# Patient Record
Sex: Female | Born: 1965
Health system: Southern US, Community
[De-identification: ages and names within clinical notes are randomized; demographics above are authoritative.]

## PROBLEM LIST (undated history)

## (undated) DIAGNOSIS — F319 Bipolar disorder, unspecified: Secondary | ICD-10-CM

## (undated) DIAGNOSIS — F329 Major depressive disorder, single episode, unspecified: Secondary | ICD-10-CM

## (undated) DIAGNOSIS — F32A Depression, unspecified: Secondary | ICD-10-CM

## (undated) DIAGNOSIS — R569 Unspecified convulsions: Secondary | ICD-10-CM

## (undated) DIAGNOSIS — F419 Anxiety disorder, unspecified: Secondary | ICD-10-CM

## (undated) DIAGNOSIS — D649 Anemia, unspecified: Secondary | ICD-10-CM

## (undated) DIAGNOSIS — I10 Essential (primary) hypertension: Secondary | ICD-10-CM

## (undated) DIAGNOSIS — Z9289 Personal history of other medical treatment: Secondary | ICD-10-CM

## (undated) DIAGNOSIS — IMO0001 Reserved for inherently not codable concepts without codable children: Secondary | ICD-10-CM

## (undated) DIAGNOSIS — K219 Gastro-esophageal reflux disease without esophagitis: Secondary | ICD-10-CM

## (undated) HISTORY — DX: Essential (primary) hypertension: I10

## (undated) HISTORY — PX: PANNICULECTOMY: SUR1001

## (undated) HISTORY — DX: Major depressive disorder, single episode, unspecified: F32.9

## (undated) HISTORY — DX: Anxiety disorder, unspecified: F41.9

## (undated) HISTORY — DX: Depression, unspecified: F32.A

## (undated) HISTORY — DX: Bipolar disorder, unspecified: F31.9

## (undated) HISTORY — PX: CHOLECYSTECTOMY: SHX55

## (undated) HISTORY — PX: LAPAROSCOPIC GASTROTOMY W/ REPAIR OF ULCER: SUR772

## (undated) HISTORY — PX: ROTATOR CUFF REPAIR: SHX139

## (undated) HISTORY — DX: Reserved for inherently not codable concepts without codable children: IMO0001

---

## 1998-03-21 HISTORY — PX: GASTRIC BYPASS: SHX52

## 1998-10-29 ENCOUNTER — Ambulatory Visit: Admission: RE | Admit: 1998-10-29 | Discharge: 1998-10-29 | Payer: Self-pay | Admitting: Internal Medicine

## 1998-12-28 ENCOUNTER — Other Ambulatory Visit: Admission: RE | Admit: 1998-12-28 | Discharge: 1998-12-28 | Payer: Self-pay | Admitting: Obstetrics and Gynecology

## 2000-01-13 ENCOUNTER — Other Ambulatory Visit: Admission: RE | Admit: 2000-01-13 | Discharge: 2000-01-13 | Payer: Self-pay | Admitting: Gynecology

## 2000-01-19 ENCOUNTER — Encounter (INDEPENDENT_AMBULATORY_CARE_PROVIDER_SITE_OTHER): Payer: Self-pay

## 2000-01-19 ENCOUNTER — Other Ambulatory Visit: Admission: RE | Admit: 2000-01-19 | Discharge: 2000-01-19 | Payer: Self-pay | Admitting: Gynecology

## 2000-03-25 ENCOUNTER — Encounter: Payer: Self-pay | Admitting: Emergency Medicine

## 2000-03-25 ENCOUNTER — Emergency Department (HOSPITAL_COMMUNITY): Admission: EM | Admit: 2000-03-25 | Discharge: 2000-03-25 | Payer: Self-pay | Admitting: Emergency Medicine

## 2000-11-24 ENCOUNTER — Ambulatory Visit (HOSPITAL_COMMUNITY): Admission: RE | Admit: 2000-11-24 | Discharge: 2000-11-24 | Payer: Self-pay | Admitting: Internal Medicine

## 2001-02-20 ENCOUNTER — Other Ambulatory Visit: Admission: RE | Admit: 2001-02-20 | Discharge: 2001-02-20 | Payer: Self-pay | Admitting: Gynecology

## 2001-06-14 ENCOUNTER — Encounter: Payer: Self-pay | Admitting: Internal Medicine

## 2001-06-14 ENCOUNTER — Ambulatory Visit (HOSPITAL_COMMUNITY): Admission: RE | Admit: 2001-06-14 | Discharge: 2001-06-14 | Payer: Self-pay | Admitting: Internal Medicine

## 2001-11-06 ENCOUNTER — Inpatient Hospital Stay (HOSPITAL_COMMUNITY): Admission: RE | Admit: 2001-11-06 | Discharge: 2001-11-08 | Payer: Self-pay | Admitting: Orthopaedic Surgery

## 2002-07-02 ENCOUNTER — Other Ambulatory Visit: Admission: RE | Admit: 2002-07-02 | Discharge: 2002-07-02 | Payer: Self-pay | Admitting: Gynecology

## 2002-09-13 ENCOUNTER — Emergency Department (HOSPITAL_COMMUNITY): Admission: EM | Admit: 2002-09-13 | Discharge: 2002-09-13 | Payer: Self-pay | Admitting: Emergency Medicine

## 2002-09-13 ENCOUNTER — Encounter: Payer: Self-pay | Admitting: Emergency Medicine

## 2003-04-23 ENCOUNTER — Emergency Department (HOSPITAL_COMMUNITY): Admission: EM | Admit: 2003-04-23 | Discharge: 2003-04-23 | Payer: Self-pay | Admitting: Emergency Medicine

## 2003-05-30 ENCOUNTER — Ambulatory Visit (HOSPITAL_COMMUNITY): Admission: RE | Admit: 2003-05-30 | Discharge: 2003-05-30 | Payer: Self-pay | Admitting: Orthopaedic Surgery

## 2003-06-24 ENCOUNTER — Inpatient Hospital Stay (HOSPITAL_COMMUNITY): Admission: RE | Admit: 2003-06-24 | Discharge: 2003-06-26 | Payer: Self-pay | Admitting: Orthopaedic Surgery

## 2003-11-28 ENCOUNTER — Ambulatory Visit: Payer: Self-pay | Admitting: Psychiatry

## 2004-03-01 ENCOUNTER — Other Ambulatory Visit: Admission: RE | Admit: 2004-03-01 | Discharge: 2004-03-01 | Payer: Self-pay | Admitting: Gynecology

## 2004-03-20 ENCOUNTER — Emergency Department (HOSPITAL_COMMUNITY): Admission: EM | Admit: 2004-03-20 | Discharge: 2004-03-20 | Payer: Self-pay | Admitting: Emergency Medicine

## 2004-08-20 ENCOUNTER — Ambulatory Visit: Payer: Self-pay | Admitting: Psychiatry

## 2004-09-25 ENCOUNTER — Emergency Department (HOSPITAL_COMMUNITY): Admission: EM | Admit: 2004-09-25 | Discharge: 2004-09-25 | Payer: Self-pay | Admitting: Emergency Medicine

## 2004-10-14 ENCOUNTER — Inpatient Hospital Stay (HOSPITAL_COMMUNITY): Admission: EM | Admit: 2004-10-14 | Discharge: 2004-10-20 | Payer: Self-pay | Admitting: *Deleted

## 2004-10-14 ENCOUNTER — Ambulatory Visit: Payer: Self-pay | Admitting: Gastroenterology

## 2004-10-29 ENCOUNTER — Ambulatory Visit: Payer: Self-pay | Admitting: Psychiatry

## 2004-12-17 ENCOUNTER — Ambulatory Visit: Payer: Self-pay | Admitting: Psychiatry

## 2005-01-12 ENCOUNTER — Emergency Department (HOSPITAL_COMMUNITY): Admission: EM | Admit: 2005-01-12 | Discharge: 2005-01-12 | Payer: Self-pay | Admitting: Emergency Medicine

## 2005-01-18 ENCOUNTER — Ambulatory Visit (HOSPITAL_COMMUNITY): Admission: RE | Admit: 2005-01-18 | Discharge: 2005-01-18 | Payer: Self-pay | Admitting: Family Medicine

## 2005-02-04 ENCOUNTER — Ambulatory Visit: Payer: Self-pay | Admitting: Psychiatry

## 2005-03-12 ENCOUNTER — Inpatient Hospital Stay (HOSPITAL_COMMUNITY): Admission: EM | Admit: 2005-03-12 | Discharge: 2005-03-16 | Payer: Self-pay | Admitting: Emergency Medicine

## 2005-04-08 ENCOUNTER — Other Ambulatory Visit: Admission: RE | Admit: 2005-04-08 | Discharge: 2005-04-08 | Payer: Self-pay | Admitting: Gynecology

## 2005-04-15 ENCOUNTER — Ambulatory Visit: Payer: Self-pay | Admitting: Psychiatry

## 2005-05-16 ENCOUNTER — Ambulatory Visit (HOSPITAL_COMMUNITY): Admission: RE | Admit: 2005-05-16 | Discharge: 2005-05-16 | Payer: Self-pay | Admitting: Orthopaedic Surgery

## 2005-06-10 ENCOUNTER — Ambulatory Visit (HOSPITAL_COMMUNITY): Payer: Self-pay | Admitting: Psychiatry

## 2005-08-02 ENCOUNTER — Ambulatory Visit (HOSPITAL_COMMUNITY): Admission: RE | Admit: 2005-08-02 | Discharge: 2005-08-02 | Payer: Self-pay | Admitting: Family Medicine

## 2005-08-05 ENCOUNTER — Ambulatory Visit (HOSPITAL_COMMUNITY): Payer: Self-pay | Admitting: Psychiatry

## 2005-12-23 ENCOUNTER — Ambulatory Visit (HOSPITAL_COMMUNITY): Payer: Self-pay | Admitting: Psychiatry

## 2006-05-20 ENCOUNTER — Emergency Department (HOSPITAL_COMMUNITY): Admission: EM | Admit: 2006-05-20 | Discharge: 2006-05-20 | Payer: Self-pay | Admitting: Emergency Medicine

## 2006-05-28 ENCOUNTER — Emergency Department (HOSPITAL_COMMUNITY): Admission: EM | Admit: 2006-05-28 | Discharge: 2006-05-28 | Payer: Self-pay | Admitting: Emergency Medicine

## 2006-06-23 ENCOUNTER — Ambulatory Visit (HOSPITAL_COMMUNITY): Payer: Self-pay | Admitting: Psychiatry

## 2006-09-01 ENCOUNTER — Ambulatory Visit (HOSPITAL_COMMUNITY): Payer: Self-pay | Admitting: Psychiatry

## 2006-09-25 ENCOUNTER — Ambulatory Visit: Payer: Self-pay | Admitting: Psychiatry

## 2006-09-25 ENCOUNTER — Inpatient Hospital Stay (HOSPITAL_COMMUNITY): Admission: AD | Admit: 2006-09-25 | Discharge: 2006-09-29 | Payer: Self-pay | Admitting: Psychiatry

## 2006-10-13 ENCOUNTER — Ambulatory Visit (HOSPITAL_COMMUNITY): Payer: Self-pay | Admitting: Psychiatry

## 2006-12-07 ENCOUNTER — Ambulatory Visit: Payer: Self-pay | Admitting: *Deleted

## 2006-12-07 ENCOUNTER — Inpatient Hospital Stay (HOSPITAL_COMMUNITY): Admission: EM | Admit: 2006-12-07 | Discharge: 2006-12-12 | Payer: Self-pay | Admitting: Emergency Medicine

## 2006-12-08 ENCOUNTER — Other Ambulatory Visit: Payer: Self-pay | Admitting: *Deleted

## 2006-12-22 ENCOUNTER — Ambulatory Visit (HOSPITAL_COMMUNITY): Payer: Self-pay | Admitting: Psychiatry

## 2007-01-19 ENCOUNTER — Ambulatory Visit (HOSPITAL_COMMUNITY): Payer: Self-pay | Admitting: Psychiatry

## 2007-06-22 ENCOUNTER — Ambulatory Visit (HOSPITAL_COMMUNITY): Payer: Self-pay | Admitting: Psychiatry

## 2007-07-20 ENCOUNTER — Ambulatory Visit (HOSPITAL_COMMUNITY): Payer: Self-pay | Admitting: Psychiatry

## 2007-09-14 ENCOUNTER — Ambulatory Visit (HOSPITAL_COMMUNITY): Payer: Self-pay | Admitting: Psychiatry

## 2007-10-12 ENCOUNTER — Ambulatory Visit (HOSPITAL_COMMUNITY): Payer: Self-pay | Admitting: Psychiatry

## 2007-10-13 ENCOUNTER — Inpatient Hospital Stay (HOSPITAL_COMMUNITY): Admission: EM | Admit: 2007-10-13 | Discharge: 2007-10-16 | Payer: Self-pay | Admitting: Emergency Medicine

## 2008-01-04 ENCOUNTER — Ambulatory Visit (HOSPITAL_COMMUNITY): Payer: Self-pay | Admitting: Psychiatry

## 2008-05-09 ENCOUNTER — Ambulatory Visit (HOSPITAL_COMMUNITY): Payer: Self-pay | Admitting: Psychiatry

## 2008-06-27 ENCOUNTER — Ambulatory Visit (HOSPITAL_COMMUNITY): Payer: Self-pay | Admitting: Psychiatry

## 2008-08-01 ENCOUNTER — Ambulatory Visit (HOSPITAL_COMMUNITY): Payer: Self-pay | Admitting: Psychiatry

## 2008-08-26 ENCOUNTER — Encounter: Admission: RE | Admit: 2008-08-26 | Discharge: 2008-08-26 | Payer: Self-pay | Admitting: Internal Medicine

## 2008-10-30 ENCOUNTER — Ambulatory Visit (HOSPITAL_COMMUNITY): Payer: Self-pay | Admitting: Psychiatry

## 2009-01-16 ENCOUNTER — Ambulatory Visit (HOSPITAL_COMMUNITY): Payer: Self-pay | Admitting: Psychiatry

## 2009-02-27 ENCOUNTER — Ambulatory Visit (HOSPITAL_COMMUNITY): Payer: Self-pay | Admitting: Psychiatry

## 2009-03-11 ENCOUNTER — Observation Stay (HOSPITAL_COMMUNITY): Admission: EM | Admit: 2009-03-11 | Discharge: 2009-03-11 | Payer: Self-pay | Admitting: Emergency Medicine

## 2009-04-24 ENCOUNTER — Ambulatory Visit (HOSPITAL_COMMUNITY): Payer: Self-pay | Admitting: Psychiatry

## 2009-05-22 ENCOUNTER — Ambulatory Visit (HOSPITAL_COMMUNITY): Payer: Self-pay | Admitting: Psychiatry

## 2009-07-31 ENCOUNTER — Ambulatory Visit (HOSPITAL_COMMUNITY): Payer: Self-pay | Admitting: Psychiatry

## 2009-10-18 ENCOUNTER — Emergency Department (HOSPITAL_COMMUNITY): Admission: EM | Admit: 2009-10-18 | Discharge: 2009-10-18 | Payer: Self-pay | Admitting: Emergency Medicine

## 2009-11-25 ENCOUNTER — Ambulatory Visit (HOSPITAL_COMMUNITY): Admission: RE | Admit: 2009-11-25 | Discharge: 2009-11-25 | Payer: Self-pay | Admitting: Psychiatry

## 2010-03-21 HISTORY — PX: INTRAUTERINE DEVICE (IUD) INSERTION: SHX5877

## 2010-04-11 ENCOUNTER — Encounter: Payer: Self-pay | Admitting: Orthopaedic Surgery

## 2010-04-11 ENCOUNTER — Encounter: Payer: Self-pay | Admitting: Internal Medicine

## 2010-06-05 LAB — POCT I-STAT, CHEM 8
BUN: 21 mg/dL (ref 6–23)
Calcium, Ion: 1.06 mmol/L — ABNORMAL LOW (ref 1.12–1.32)
Chloride: 110 mEq/L (ref 96–112)
Creatinine, Ser: 1.5 mg/dL — ABNORMAL HIGH (ref 0.4–1.2)
Glucose, Bld: 90 mg/dL (ref 70–99)
HCT: 42 % (ref 36.0–46.0)
Hemoglobin: 14.3 g/dL (ref 12.0–15.0)
Potassium: 3.7 mEq/L (ref 3.5–5.1)
Sodium: 142 mEq/L (ref 135–145)
TCO2: 23 mmol/L (ref 0–100)

## 2010-06-05 LAB — POCT PREGNANCY, URINE: Preg Test, Ur: NEGATIVE

## 2010-06-05 LAB — RAPID URINE DRUG SCREEN, HOSP PERFORMED
Amphetamines: NOT DETECTED
Barbiturates: NOT DETECTED
Benzodiazepines: POSITIVE — AB
Cocaine: NOT DETECTED
Opiates: NOT DETECTED
Tetrahydrocannabinol: NOT DETECTED

## 2010-06-05 LAB — URINALYSIS, ROUTINE W REFLEX MICROSCOPIC
Bilirubin Urine: NEGATIVE
Glucose, UA: NEGATIVE mg/dL
Hgb urine dipstick: NEGATIVE
Ketones, ur: NEGATIVE mg/dL
Leukocytes, UA: NEGATIVE
Nitrite: NEGATIVE
Protein, ur: 30 mg/dL — AB
Specific Gravity, Urine: 1.041 — ABNORMAL HIGH (ref 1.005–1.030)
Urobilinogen, UA: 0.2 mg/dL (ref 0.0–1.0)
pH: 6 (ref 5.0–8.0)

## 2010-06-05 LAB — URINE MICROSCOPIC-ADD ON: Urine-Other: NONE SEEN

## 2010-06-05 LAB — ETHANOL: Alcohol, Ethyl (B): <5 mg/dL (ref 0–10)

## 2010-06-21 LAB — COMPREHENSIVE METABOLIC PANEL
ALT: 107 U/L — ABNORMAL HIGH (ref 0–35)
AST: 53 U/L — ABNORMAL HIGH (ref 0–37)
Albumin: 3.9 g/dL (ref 3.5–5.2)
Alkaline Phosphatase: 180 U/L — ABNORMAL HIGH (ref 39–117)
BUN: 11 mg/dL (ref 6–23)
CO2: 25 mEq/L (ref 19–32)
Calcium: 9.4 mg/dL (ref 8.4–10.5)
Chloride: 105 mEq/L (ref 96–112)
Creatinine, Ser: 1.31 mg/dL — ABNORMAL HIGH (ref 0.4–1.2)
GFR calc Af Amer: 54 mL/min — ABNORMAL LOW (ref >60)
GFR calc non Af Amer: 45 mL/min — ABNORMAL LOW (ref >60)
Glucose, Bld: 110 mg/dL — ABNORMAL HIGH (ref 70–99)
Potassium: 3.8 mEq/L (ref 3.5–5.1)
Sodium: 142 mEq/L (ref 135–145)
Total Bilirubin: 1 mg/dL (ref 0.3–1.2)
Total Protein: 7.4 g/dL (ref 6.0–8.3)

## 2010-06-21 LAB — CBC
HCT: 44.3 % (ref 36.0–46.0)
Hemoglobin: 14.6 g/dL (ref 12.0–15.0)
MCHC: 32.9 g/dL (ref 30.0–36.0)
MCV: 88.1 fL (ref 78.0–100.0)
Platelets: 236 10*3/uL (ref 150–400)
RBC: 5.03 MIL/uL (ref 3.87–5.11)
RDW: 15.9 % — ABNORMAL HIGH (ref 11.5–15.5)
WBC: 7.3 10*3/uL (ref 4.0–10.5)

## 2010-06-21 LAB — URINALYSIS, ROUTINE W REFLEX MICROSCOPIC
Bilirubin Urine: NEGATIVE
Glucose, UA: NEGATIVE mg/dL
Ketones, ur: NEGATIVE mg/dL
Leukocytes, UA: NEGATIVE
Nitrite: NEGATIVE
Protein, ur: 100 mg/dL — AB
Specific Gravity, Urine: 1.013 (ref 1.005–1.030)
Urobilinogen, UA: 0.2 mg/dL (ref 0.0–1.0)
pH: 8 (ref 5.0–8.0)

## 2010-06-21 LAB — URINE CULTURE: Colony Count: 15000

## 2010-06-21 LAB — GLUCOSE, CAPILLARY
Glucose-Capillary: 116 mg/dL — ABNORMAL HIGH (ref 70–99)
Glucose-Capillary: 122 mg/dL — ABNORMAL HIGH (ref 70–99)
Glucose-Capillary: 99 mg/dL (ref 70–99)

## 2010-06-21 LAB — DIFFERENTIAL
Basophils Absolute: 0 10*3/uL (ref 0.0–0.1)
Basophils Relative: 0 % (ref 0–1)
Eosinophils Absolute: 0.1 10*3/uL (ref 0.0–0.7)
Eosinophils Relative: 1 % (ref 0–5)
Lymphocytes Relative: 23 % (ref 12–46)
Lymphs Abs: 1.7 10*3/uL (ref 0.7–4.0)
Monocytes Absolute: 0.4 10*3/uL (ref 0.1–1.0)
Monocytes Relative: 5 % (ref 3–12)
Neutro Abs: 5.2 10*3/uL (ref 1.7–7.7)
Neutrophils Relative %: 71 % (ref 43–77)

## 2010-06-21 LAB — URINE MICROSCOPIC-ADD ON

## 2010-06-21 LAB — LIPASE, BLOOD: Lipase: 67 U/L — ABNORMAL HIGH (ref 11–59)

## 2010-07-05 ENCOUNTER — Ambulatory Visit: Payer: Self-pay | Admitting: Women's Health

## 2010-08-03 NOTE — Discharge Summary (Signed)
[name], ANSELL             ACCOUNT NO.:  [id number]   MEDICAL RECORD NO.:  [id number]          PATIENT TYPE:  OBV   LOCATION:  A203                          FACILITY:  [facility]   PHYSICIAN:  Salem Caster, DO    DATE OF BIRTH:  14-Sep-1965   DATE OF ADMISSION:  12/06/2006  DATE OF DISCHARGE:  09/18/2008LH                               DISCHARGE SUMMARY   DISCHARGE DIAGNOSES:  1. Overdose of multiple medications including Flexeril, Seroquel,      Lamictal and Klonopin.  2. Suicide attempt with history of six previous attempts.  3. Hypokalemia.  4. Hypocalcemia.  5. Hypertension.  6. Renal insufficiency.  7. Elevated creatinine kinase.  8. Anemia.  9. Hyperglycemia.   BRIEF HOSPITAL COURSE:  Mrs. [name] is a 45 year old African American  female who presented as she took and undetermined amount of Flexeril,  Seroquel, Lamictal and Klonopin.  Apparently, the patient was home alone  and the family found her drowsy and brought her to the hospital for  evaluation.  The patient does have a significant history of  approximately six suicide attempts in the past, the last one a few weeks  prior.  She was admitted to [facility] for suicide ideation and  homicidal ideation in June 2008.  Upon being seen in the emergency room,  the patient was found to have a white count of 5.2, hemoglobin 10.3,  hematocrit 31.2 platelet count 227,000.  Drug screen was positive for  benzodiazepines.  Salicylate levels undetectable.  Sodium 137, potassium  3.4, chloride 109, bicarb 25, glucose 159, BUN 23, creatinine 1.73, CPK  606, CPK-MB was 1.3.  EKG revealed sinus tachycardia with rate of 104.  She had some nonspecific T-wave changes and poor R wave progression but  no ST elevation.  The patient was admitted secondary to her overdose.  She was sent to the telemetry unit with a 24-hour sitter.  The patient  became awake and alert.  Poison control was notified.  The patient was  found to have  some slight rhabdomyolysis which is side-effects from the  Lamictal.  She was put on IV fluids and her CPKs were monitored.  She is  also found to be hypokalemic.  This was replaced, and her  hydrochlorothiazide was held.  The patient was found to be hypotensive  which improved with IV hydration.  Her blood pressure medications were  held.  ACT Team was consulted and decided the patient needs to be  transferred to [facility] for inpatient treatment at  this time.  At this time, patient is stable.  Vital signs on transfer:  Temperature is 97.7, pulse 96, respirations 18, blood pressure 119/75  patient is sating 99% on room air.   LABORATORY DATA:  Total bilirubin 0.70 rectum 0.1, alkaline phosphatase  was 335, AST 22, ALT 15, total protein 5.2, albumin 2.7.  Her total  creatinine kinase was 521.  Sodium 143, potassium 3.6, chloride 111, CO2  23, glucose 81, BUN 17, creatinine 1.61, white count 5.9, hemoglobin  9.4, hematocrit 28.6, platelet count 213.   CURRENT MEDICATIONS IN THE HOSPITAL:  1. Lovenox 40 mg subcu daily.  2. Prozac 60 mg daily.  3. Lisinopril 10 mg daily.  4. Protonix 40 mg IV q.24 h.  5. Senokot one tablet p.o. q.h.s.  6. Zofran 4 mg IV q. 8 h p.r.n.  7. Phenergan 12.5 mg IV q. 4 p.r.n.   CONDITION AT DISCHARGE:  Stable.  The patient to be discharged to [facility] in stable condition.      Salem Caster, DO  Electronically Signed     SM/MEDQ  D:  12/07/2006  T:  12/08/2006  Job:  [id number]

## 2010-08-03 NOTE — H&P (Signed)
[name], [name]             ACCOUNT NO.:  [id number]   MEDICAL RECORD NO.:  [id number]          PATIENT TYPE:  INP   LOCATION:  4713                         FACILITY:  [facility]   PHYSICIAN:  Jana Hakim, M.D. DATE OF BIRTH:  [birth year]   DATE OF ADMISSION:  10/13/2007  DATE OF DISCHARGE:                              HISTORY & PHYSICAL   PRIMARY CARE PHYSICIAN:  Glendale Chard, MD.   CHIEF COMPLAINT:  Seizure.   HISTORY OF PRESENT ILLNESS:  This is a 45 year old female who was sent  to the emergency department emergently after suffering a seizure at  home.  The seizure activity was witnessed.  It was described as patient  falling back onto her bed and becoming rigid and then shaking all over.  Patient had loss of urine along with biting of her tongue.  This  activity lasted about 3 minutes.  EMS was called and afterward patient  was confused.  The patient does not recall what happened.  Prior to the  episode she denies having any prodromal symptoms.  She also denies  having any previous similar history of seizures.  Of note, patient does  report having a recent new medication.  She does have a history of  bipolar disorder and had been on Lamictal therapy, which was  discontinued secondary to GI distress, and Geodon was started 3 days ago  and she reports that her dosage was 60 mg of Geodon once daily and she  has not started the b.i.d. dosing yet which was to follow.   PAST MEDICAL HISTORY:  Significant for:  1. Hypertension.  2. Bipolar disorder.  3. Morbid obesity.  4. Depression.   PAST SURGICAL HISTORY:  1. History of a right rotator cuff surgery x2.  2. History of gastric bypass surgery in 2000.   MEDICATIONS AT THIS TIME:  1. Cardura 2 mg one p.o. daily.  2. Geodon which was 60 mg one p.o. daily so far.   ALLERGIES:  no known drug allergies.   SOCIAL HISTORY:  Patient is a nonsmoker, nondrinker.   FAMILY HISTORY:  Noncontributory.   PHYSICAL EXAMINATION  FINDINGS:  This is a 45 year old morbidly obese  female in no discomfort or acute distress currently.  VITAL SIGNS ON INITIAL:  Temperature 98.1 blood pressure 125/81, heart  rate 106, respirations 20 and O2 saturation 96-97%.  HEENT EXAMINATION:  Normocephalic, atraumatic.  Pupils equally round,  reactive to light, extraocular movements are intact.  Funduscopic  benign.  Oropharynx is clear.  NECK:  Supple, full range of motion.  No thyromegaly, adenopathy or  jugular venous distention.  CARDIOVASCULAR:  Initially with tachycardia,  now regular rate, rhythm.  No murmurs, gallops, rubs.  LUNGS:  Clear to auscultation bilaterally.  ABDOMEN:  Positive bowel sounds, soft, nontender, nondistended.  EXTREMITIES:  Without cyanosis, clubbing or edema.  NEUROLOGIC EXAMINATION:  Patient is alert and oriented.  Her speech is  clear.  There are no cranial nerve deficits.  There are no motor or  sensory deficits.  Cerebellar function is also intact.   LABORATORY STUDIES:  White blood cell count 5.9, hemoglobin 11.6,  hematocrit 36.8, platelets 209,000.  Neutrophils 57%, lymphocytes 24%.  Sodium 138 potassium 3.8, chloride 106, carbon dioxide 26.  BUN 18,  creatinine 1.39 and glucose 91.  Albumin 3.5, AST 23, ALT 15.  Urinalysis negative except for 30 mg/dL of protein.  CT scan of the head  is negative for any intracranial hemorrhage, no evidence of an acute  infarction.  Of note, asymmetry was seen in the pituitary region.  As  noted, this was possibly secondary to head tilt.  Chest x-ray reveals no  acute disease process.  Urine pregnancy test negative.  Prothrombin time  13.8, INR 1.0 and PTT 25.   ASSESSMENT:  A 45 year old female being admitted with:  1. New onset of seizures.  2. Hypertension.  3. Bipolar disorder.  4. Mild anemia.  5. Morbid obesity.   PLAN:  The patient will be admitted to telemetry area and cardiac  enzymes will also be performed.  The patient will be placed on   neurologic checks and seizure precautions.  PRN Ativan therapy has been  ordered for return of seizure activity.  Geodon therapy will be held.  Note that after admission patient did suffer another seizure and Keppra  therapy was ordered.  Patient was loaded with Keppra therapy 1000 mg IV  x1 and will receive Keppra therapy IV every 12 hours.  An EEG study has  also been ordered.  Of note, the Geodon therapy was cross-referenced and  found to have a small incidence of seizure activities associated with  it.  Also, prolongation of the QT interval can also be seen with this  medication.  Patient has been placed on telemetry and will have  cardiopulmonary monitoring.  DVT and GI prophylaxis have also been  ordered.      Jana Hakim, M.D.  Electronically Signed     HJ/MEDQ  D:  10/14/2007  T:  10/14/2007  Job:  [id number]   cc:   Theda Belfast. Baird Cancer, M.D.
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
## 2010-08-03 NOTE — Procedures (Signed)
CLINICAL HISTORY:  This 45 year old female was admitted for new onset of  seizure at home, she fell back into bed, became rigid, shook call over,  lost control of bladder, tongue biting, episodes last about 3 minutes,  the patient could not recall event, confused upon awakening, and the  patient recently changed medicine from Lamictal to Olivia Lopez de Gutierrez, and had a  history of bipolar disorder, depression, and morbidly obesity.   CURRENT MEDICATIONS:  Cardura, Lovenox, aspirin, Protonix, Keppra,  Ativan, Zofran, Ambien, Tylenol, and oxycodone.   TECHNICAL:  An 18 channel EEG was performed based on Standard  International 10-20 system, with 17th channel dedicated to EKG, which  has demonstrated normal sinus rhythm of 84.   Upon awakening, the posterior background activity was normal, symmetric,  9 Hz, reactive to eye opening and closure.  There was focal slowing at  the left temporal lobe involving T3, T5, and also F7 leads, and which  became more obvious during photic stimulation, hyperventilation, and  post hyperventilation.  Post hyperventilation the patient has developed  spike waves, involving T3, T5, P3, F3, and with field spreading to  adjacent leads.  The patient was drowsy during recording, and was not  able to achieve a deeper stage of sleep.   In conclusion, this is an abnormal study.  There is focal slowing  involving the left temporal region, in addition there were also spike  waves involving the T5, T3.  The above findings suggestive of focal  seizure, and imaging study is warranted in this patient.      Marcial Pacas, MD  Electronically Signed     [id number]  D:  10/16/2007 14:44:16  T:  10/17/2007 04:17:57  Job #:  [id number]

## 2010-08-03 NOTE — H&P (Signed)
NAMEVENITTA, BACKES             ACCOUNT NO.:  [id number]   MEDICAL RECORD NO.:  [id number]          PATIENT TYPE:  OBV   LOCATION:  A203                          FACILITY:  [facility]   PHYSICIAN:  Marlene Bast, MDDATE OF BIRTH:  25-Jan-1966   DATE OF ADMISSION:  12/06/2006  DATE OF DISCHARGE:  LH                              HISTORY & PHYSICAL   CHIEF COMPLAINT:  Overdose.   HISTORY OF PRESENTING ILLNESS:  Mrs. Lala is a 45 year old woman who  took an undetermined amount of pills today at home.  She admits to  taking Flexeril, Seroquel, Lamictal and Klonopin.  Apparently, she was  home alone and the family came home and found her quite drowsy and they  were responsible for getting her to the hospital.   PAST MEDICAL HISTORY:  1. Significant for the fact she has had 6 suicide attempts in the      past; the last one was just a few weeks ago,  She was actually      admitted for suicidal ideation and homicidal ideation in July 2008      to [facility].  2. History of depression.  3. Bipolar disorder.  4. Chronic pain secondary to arthritis.  5. History of hypertension.  6. Obesity.  7. Renal insufficiency.  Her creatinine in July 2008 was 1.45.  8. The patient tells me she has a history of anemia as well.  9. She does have a history of rotator cuff surgery on the right.   MEDICATIONS ON ARRIVAL:  1. Prozac 60 mg p.o. once a day.  2. Seroquel 100 mg at bedtime.  3. Flexeril 10 mg t.i.d. as needed.  4. Lisinopril 10 mg a day.  5. Hydrochlorothiazide 12.5 mg once a day.  6. Clonazepam 0.5 mg twice a day.  7. Relafen daily.  8. A prescription strength omega-3 fatty acid.   SOCIAL HISTORY:  The patient does not smoke cigarettes or use any  illicit drugs, nor does she drinks alcohol.   FAMILY HISTORY:  Significant for hypertension and diabetes.   REVIEW OF SYSTEMS:  CONSTITUTIONAL:  No night sweats.  She does have an  appetite she reports is only fair.  She feels  she has lost maybe 6  pounds in the last 6 months.  HEENT:  No significant headaches, double  vision or sinus trouble.  No sore throat reported.  CARDIOVASCULAR:  No  chest pains and no lower extremity edema.  RESPIRATORY:  No shortness of  breath or hemoptysis.  GI:  She does have occasional diarrhea,  occasional constipation.  She had no trouble with abdominal pain prior  to coming to the emergency department, but is having some pains now  since drinking the charcoal.  GYN:  The patient says her periods are  still regular.  PSYCHIATRIC:  Major depression and insomnia.  She does  have the trouble with major depression and bipolar disorder as mentioned  above.  NEUROLOGIC:  She does report some numbness in her fingers and  toes, no asymmetric weakness.   PHYSICAL EXAMINATION:  VITAL SIGNS:  On arrival, the  patient's  temperature was 99, her blood pressure was 130/84, pulse 88, respiratory  rate 18.  At the time of my evaluation, the patient's systolic blood  pressure is 90.  The patient is saturating 100% on 2-L nasal cannula.  GENERAL:  On physical examination, she is an obese woman who is in no  acute distress.  She is awake and alert.  HEENT:  Normocephalic, atraumatic.  Her pupils are dilated and briskly  reactive bilaterally.  Her sclera are anicteric.  Oral mucosa is  slightly dry.  NECK:  Supple.  No lymphadenopathy, no thyromegaly, no jugular venous  distention.  CARDIAC:  Regular.  I did not appreciate a murmur.  LUNGS:  Clear to auscultation bilaterally.  No wheezes, rhonchi or  rales.  She does have diminished breath sounds in the bases.  ABDOMEN:  Obese.  It is tender to deep palpation in both the upper quadrants and  across the epigastric area.  Her lower quadrants are not tender.  She  has no rebound or guarding.  No masses are appreciated.  EXTREMITIES:  Her lower extremities reveal no evidence of clubbing,  cyanosis or pitting edema.  Her DP pulses are faint, but  palpable.  SKIN:  Warm and dry with normal color.  She has no significant rashes or  open lesions noted.  Her sacral area was not examined.  NEUROLOGICAL:  She is alert and oriented x3.  She is appropriate.  Her  cranial nerves II-XII are intact grossly.  She has no slurred speech and  she has 5/5 strength in her upper and lower extremities.  Sensory exam  grossly is intact to the upper and lower extremities.  She has normal  muscle tone and bulk.  MUSCULOSKELETAL:  Examination reveals a scar over her right shoulder and  that arm has diminished range of motion.  She has diminished range of  motion in the left shoulder as well.   DATA:  The patient's white count is 5.2, hemoglobin 10.3, hematocrit  31.2, platelet count is 227,000.  She had a urinalysis which is yellow  and clear and essentially negative.  Her urine pregnancy test is also  negative.  Drug screen was positive for benzodiazepines, otherwise  negative.  Alcohol level undetectable.  Salicylate level undetectable.  Sodium is 137, potassium 3.4, chloride 109, bicarb 25, glucose 159, BUN  23, creatinine 1.73.  CPK is 606, CPK-MB is 1.3.   The patient's EKG reveals sinus tachycardia with a rate of 104.  She has  some nonspecific T-wave changes and poor R-wave progression, but no ST-  segment elevation or depressions.   ASSESSMENT AND PLAN:  1. Overdose of multiple medications including Flexeril, Seroquel,      Lamictal and Klonopin in this suicide attempt.  The plan is to      admit the patient to a telemetry monitor with a 24-hour sitter.      She is quite alert and awake at this time and we will continue to      monitor her mental status carefully.  According to Poison Control,      rhabdomyolysis is a side-effect from a Lamictal overdose, so we      will put the patient on IV fluids and monitor her CPK.  We will      hold all of her unessential medications at this time.  In the      morning, once she is medically cleared,  we will have a psychiatry  consult.  2. Mild hypokalemia.  We will replace this and hold her      hydrochlorothiazide for now.  3. Hypocalcemia.  We will check an ionized calcium and replace.  The      etiology for this is unclear.  4. Hypertension.  The patient's blood pressure is actually slightly      low the emergency department; it has been around 90 systolic during      the time of my evaluation.  We will hold her lisinopril if needed.  5. Renal insufficiency.  We will follow her BUN and creatinine      carefully.  Her creatinine is slightly higher than her July level.      Her CPK is 600, which is slightly elevated, but this is certainly      not severe rhabdomyolysis.  I do not know that it is actually      necessary to alkalinize the patient's urine at this time, but we      will continue to monitor her carefully.  She is receiving one dose      of intravenous bicarbonate in the emergency department.  6. Anemia.  We will follow her hemoglobin.  Her hemoglobin was      actually 12 during her hospitalization in July, but she does have a      known history of anemia, so we will just follow this at this time.  7. Hyperglycemia.  The patient has no history of diabetes, but her      blood sugar is 153.  We will recheck a fasting sugar in the      morning.      Marlene Bast, MD  Electronically Signed     CVC/MEDQ  D:  12/06/2006  T:  12/07/2006  Job:  [id number]   cc:   Theda Belfast. Baird Cancer, M.D.  Fax: [place]. Toy Cookey, M.D.  Fax: [phone]

## 2010-08-03 NOTE — Discharge Summary (Signed)
[name], BOLUS             ACCOUNT NO.:  [id number]   MEDICAL RECORD NO.:  [id number]          PATIENT TYPE:  INP   LOCATION:  [id number]                         FACILITY:  [facility]   PHYSICIAN:  Jacquelynn Cree, M.D.   DATE OF BIRTH:  November 13, 1965   DATE OF ADMISSION:  10/13/2007  DATE OF DISCHARGE:  10/16/2007                               DISCHARGE SUMMARY   PRIMARY CARE PHYSICIAN:  Robyn N. Baird Cancer, MD   PSYCHIATRIST:  Early Chars. Toy Cookey, MD   DISCHARGE DIAGNOSES:  1. Geodon associated adverse reaction with seizure.  2. Rhabdomyolysis.  3. Hypokalemia.  4. Acute renal insufficiency.  5. Normocytic anemia with low ferritin, likely due to menstrual      losses.  6. Hypertension.  7. Morbid obesity.  8. Bipolar disorder with anxiety.   DISCHARGE MEDICATIONS:  1. Flexeril 10 mg q.4 h. p.r.n.  2. Demerol 50 mg t.i.d. p.r.n.  3. Cardura 2 mg daily.  4. Hydrochlorothiazide 12.5 mg daily, to restart on October 19, 2007.  5. Calcium supplements.  6. Vitamin B12 supplements.   CONSULTATIONS:  Telephone consultation made with Dr. Toy Cookey.   BRIEF ADMISSION HISTORY OF PRESENT ILLNESS:  The patient is a 45-year-  old female who had a witnessed tonic-clonic seizure event and was  brought to the hospital for evaluation.  For the full details, please  see the dictated report done by Dr. Arnoldo Morale.   PROCEDURES AND DIAGNOSTIC STUDIES:  1. CT scan of the head on October 13, 2007 showed no intracranial      hemorrhage.  No CT evidence of large acute infarct.  Slight      asymmetry of the pituitary region.  2. Chest x-ray on October 13, 2007 showed no acute cardiopulmonary      process.  3. MRI of brain on October 14, 2007 showed no acute infarct or abnormal      intracranial enhancing lesion.  Exam was limited by motion      degradation.  Moderate nonspecific white matter type changes.      Decreased signal intensity of bone marrow.  4. EEG completed on October 15, 2007 with results pending.   DISCHARGE  LABORATORY VALUES:  Sodium was 137, potassium 4.0, chloride  110, bicarb 23, BUN 15, creatinine 1.25, glucose 83.   HOSPITAL COURSE:  1. Geodon-associated seizure:  The patient had a witnessed seizure      event as an outpatient.  No further seizure events were documented.      She was initially put on Keppra and telephone consultation was made      with Dr. Toy Cookey.  He advised to continue on Keppra and monitoring.      The patient was maintained on seizure precautions and had no      seizure events.  Seizure activity was felt to be secondary to an      adverse reaction to Geodon.  A full diagnostic evaluation did not      reveal any evidence of a structural lesion or other explanation.      The patient's primary care physician should follow up with the EEG  findings.  2. Rhabdomyolysis with acute renal insufficiency:  The patient was      hydrated.  Her discharge CK value was still elevated at 2092, but      the patient was adamant that she wanted to be discharged home and      agreed to continue to drink large amounts of fluids and to follow      up with her primary care physician for a recheck of her renal      function within 1-week problem.  3. Hypokalemia:  The patient was appropriately repleted.  4. Normocytic anemia:  The patient had an anemia panel done which      showed no evidence of B12, folate, or iron deficiency.  She did      have a low ferritin indicating her iron stores were low and this is      likely due to menstrual losses.  Her hemoglobin and hematocrit were      stable and her discharge hemoglobin was 10.5.  5. Hypertension:  The patient's blood pressure was controlled      throughout her hospital stay on Cardura.  6. Morbid obesity:  The patient was maintained on a heart-healthy      diet.  7. Bipolar disorder with anxiety:  The patient was instructed to      follow up with Dr. Toy Cookey.  She will call his office in morning to      get an appointment to be  seen sooner than her appointment that is      scheduled for a week from today.   DISPOSITION:  The patient is medically stable and will be discharged  home.      Jacquelynn Cree, M.D.  Electronically Signed     CR/MEDQ  D:  10/16/2007  T:  10/17/2007  Job:  [id number]   cc:   Theda Belfast. Baird Cancer, M.D.  Early Chars. Toy Cookey, M.D.

## 2010-08-06 NOTE — Discharge Summary (Signed)
NAMEKYARA, BARTMAN             ACCOUNT NO.:  [id number]   MEDICAL RECORD NO.:  [id number]          PATIENT TYPE:  INP   LOCATION:  A207                          FACILITY:  [facility]   PHYSICIAN:  Tesfaye D. Legrand Rams, MD   DATE OF BIRTH:  07/24/1965   DATE OF ADMISSION:  10/14/2004  DATE OF DISCHARGE:  08/02/2006LH                                 DISCHARGE SUMMARY   DISCHARGE DIAGNOSES:  1.  Urosepsis.  2.  Hypertension secondary to the above.  3.  Anemia of chronic disease.  4.  Hypokalemia.  5.  Morbid obesity.   DISCHARGE MEDICATIONS:  1.  Ferrous sulfate 325 mg p.o. b.i.d.  2.  Prozac 30 mg p.o. daily.  3.  Protonix 40 mg p.o. daily.  4.  Cefixime 500 mg p.o. b.i.d. for 5 days.  5.  Lisinopril 10 mg p.o. daily.   DISPOSITION:  The patient was discharged home in stable condition.   HOSPITAL COURSE:  This is a 45 year old female patient with history of  multiple medical illnesses who was admitted due to diffuse pain over her  hip, abdominal pain, and back pain. The patient was found to be febrile with  leukocytosis and abnormal urinalysis. She had also elevated BUN and  creatinine. The patient had a drop in her hemoglobin and hematocrit. She was  started on IV antibiotics, and GI and nephrology consult was done. The  patient underwent endoscopy and colonoscopy. There was sign of active GI  bleed. The patient received 2 units of packed red blood cells. Over the  hospital stay, the patient gradually improved. Her fever and hypertension  was resolved. Her potassium was corrected. Her renal function improved. The  patient was discharged home in a stable condition followup as outpatient.      Tesfaye D. Legrand Rams, MD  Electronically Signed     TDF/MEDQ  D:  11/25/2004  T:  11/25/2004  Job:  [id number]

## 2010-08-06 NOTE — Op Note (Signed)
NAME:  [name], [name]                       ACCOUNT NO.:  [id number]   MEDICAL RECORD NO.:  [id number]                   PATIENT TYPE:  AMB   LOCATION:  DAY                                  FACILITY:  [facility]   PHYSICIAN:  J. Sanjuana Kava, M.D.              DATE OF BIRTH:  [birth year]   DATE OF PROCEDURE:  DATE OF DISCHARGE:                                 OPERATIVE REPORT   PREOPERATIVE DIAGNOSIS:  Recurrent tear rotator cuff right shoulder.   POSTOPERATIVE DIAGNOSIS:  Recurrent tear rotator cuff right shoulder.   PROCEDURE:  Repair rotator cuff right shoulder using a biodegradable  [employer] surgical anchor.   ANESTHESIA:  General.   ESTIMATED BLOOD LOSS:  Minimal.   SURGEON:  J. Sanjuana Kava, M.D.   ASSISTANT:  Adolphus Birchwood, [facility].   DRAINS:  None.   INDICATIONS FOR PHYSICIAN ASSISTANT:  We are a small community hospital;  there is no house staff.  Physician assistant is necessary to help me with  manipulation of the arm.  This is a recurrent procedure.  The patient is  rather large and I need an assistant to help give me visualization through  such a small opening.   INDICATIONS FOR THE PROCEDURE:  The patient is a 45 year old female who had  rotator cuff injury in 2003.  She underwent surgery in August of that year.  She initially did well.  Over the last several months or so it is hurting  more.  MRI shows a new tear in the supraspinatus area with slight  retraction.  She has not improved with conservative treatment and repeat  surgery is now recommended.  The patient has been informed of the risks and  imponderables including infection, scarring down, possibility of loosing  some range of motion and also the possibility of another tear if her muscles  are so worn.  The patient appeared to understand and agreed to the  procedure.   The patient was seen in the holding area and the right shoulder identified  as the correct site; my initials were placed on it.  The  patient was then  taken back to the operating room.  She was given a general anesthetic and  placed in a semi-barber position.  A sandbag was placed up under the right  shoulder, under the padding of the table.  She was positioned so that the  assistant could move her arm.   The patient was then prepped and draped in the usual manner.  We reconfirmed  that we were doing Ms. Grabert's right shoulder.  Incision was made through  previous incision with careful dissection the deltoid muscle was exposed.  The suture was placed 5 cm below the [facility] joint to avoid any injury to the  axillary nerve.  On entering the deltoid __________ and the deltoid was  splint bluntly and the joint of the shoulder was entered.  Previous suture  could be  seen and her tear was a little inferior and medial-ward from the  previous repair.  There was some slight retraction, maybe 0.5 cm or so.   Using Kochers we were able to bring forth the edges.  I elected, at this  time, to use surgical anchor.  We elected a biodegradable [employer] which  was then placed which had 2 distinct sutures from it.  Using these we were  able to elicit a good repair.  I oversewed the area as well with the same  suture.  The shoulder was put through a range of motion and did very well.  I reinspected the area and no new pathology was identified.   The deltoid was reapproximated using 2-0 chromic suture, interrupted figure-  of-eight.  The subcutaneous tissue closed using layers of 2-0 plain.  Skin  closed using running 3-0 subcuticular Nylon.  Steri-Strips applied.  Bulky  dressing applied.  Shoulder immobilizer applied.  The patient taken to  recovery in good condition.  She will be admitted with a PCA pump.      ___________________________________________                                            Iona Hansen, M.D.   JWK/MEDQ  D:  06/24/2003  T:  06/24/2003  Job:  [id number]

## 2010-08-06 NOTE — H&P (Signed)
NAME:  [name], [name]                       ACCOUNT NO.:  [id number]   MEDICAL RECORD NO.:  [id number]                   PATIENT TYPE:  AMB   LOCATION:  DAY                                  FACILITY:  [facility]   PHYSICIAN:  J. Sanjuana Kava, M.D.              DATE OF BIRTH:  1965-04-26   DATE OF ADMISSION:  DATE OF DISCHARGE:                                HISTORY & PHYSICAL   HISTORY OF PRESENT ILLNESS:  The patient is a 45 year old female with pain  and tenderness in her right shoulder.   CHIEF COMPLAINT:  My right shoulder hurts, it is a rotator cuff tear.   The patient had a tear of her right rotator cuff in the past.  It was an on  the job injury dating back to 2003.  In 2003, the right rotator cuff repair  in August.  She initially did very well and tolerated the procedure well.  She had a near acromioplasty and repair of the rotator cuff.  The patient  works for the [employer].  She has subsequently gone back to the [employer], but in the last several months had noted increasing new pain to the  right shoulder.  A MRI was done to determine whether she had a new tear or  not.  She had two MRIs, one without contrast and one with that showed that  she had a full-thickness of supraspinatus tear with slight retraction of  approximately 8 mm of the supraspinatus.  The infraspinatus and  subscapularis were intact.  She might have an anterior labial tear as well.  The patient was advised of the findings.  She tried physical therapy, that  did not help.  We got permission from [employer] for repeat repair of the  rotator cuff.  She has been informed of the prognosis that she may not have  full range of motion return to this shoulder because of previous surgery and  scar tissue that would develop.   PAST MEDICAL HISTORY:  Hypertension.   ALLERGIES:  No known drug allergies.   CURRENT MEDICATIONS:  1. Motrin 800 mg q.6h.  2. Darvocet q.6h. p.r.n. pain.  3.  Lotensin/hydrochlorothiazide 10/25 mg one daily.   SOCIAL HISTORY:  The patient does not drink or smoke.  Dr. Arnoldo Morale is her  family doctor.  She has had a gastric bypass in 2000 by Dr. Domenic Polite at [facility], as well as a right shoulder surgery by me in 2003.   FAMILY HISTORY:  Hypertension runs in the family.  She has a sister with  diabetes.  Date on job of injury was May 27, 2001.  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  The patient is not married.  She lives in [place], she works in [place].   PHYSICAL EXAMINATION:  VITAL SIGNS:  Within normal limits.  HEENT:  Negative.  GENERAL:  The patient is alert, cooperative, and oriented.  NECK:  Supple.  LUNGS:  Clear to P&A.  HEART:  Regular rate and rhythm without murmur heard.  ABDOMEN:  Soft, nontender, with no masses, slight obesity.  EXTREMITIES:  Range of motion of the right shoulder is painful.  She can  forward flex to approximately 90 degrees, internal rotate full, externally  rotate 5 degrees, abduct to about 60 degrees with pain, and extension 10  degrees.  Other extremities are within normal limits.   IMPRESSION AND PLAN:  1. Repeat rotator cuff tear on the right.  2. History of hypertension.  3. Status post gastric bypass surgery years ago.   PLAN:  Repair of the rotator cuff, repeat procedure.  I have discussed with  the patient the planned procedure, risks, and imponderables, including the  possibility of infection, decreased range of motion, physical therapy, as  well as general anesthesia.  The patient familiar with the procedure and  agrees to it as outlined.  Labs are pending.     ___________________________________________                                         Iona Hansen, M.D.   JWK/MEDQ  D:  06/23/2003  T:  06/23/2003  Job:  [id number]

## 2010-08-06 NOTE — Discharge Summary (Signed)
[name], [name]             ACCOUNT NO.:  [id number]   MEDICAL RECORD NO.:  [id number]          PATIENT TYPE:  INP   LOCATION:  A322                          FACILITY:  [facility]   PHYSICIAN:  Vanetta Mulders. Dechurch, M.D.DATE OF BIRTH:  1966-02-18   DATE OF ADMISSION:  03/12/2005  DATE OF DISCHARGE:  12/27/2006LH                                 DISCHARGE SUMMARY   DIAGNOSES:  1.  Pyelonephritis, improving.  2.  Morbid obesity.  3.  Acute-on-chronic renal failure, improved.  4.  History of hypertension.  5.  Anemia, hemoglobin 8.2 at the time of discharge.  6.  Degenerative joint disease.  7.  Bipolar disorder.  8.  Chronic left shoulder pain secondary to degenerative disease and rotator      cuff.   HOSPITAL COURSE:  The patient is a 45 year old African-American female with  a history of morbid obesity status post gastric bypass in the past who  presented to the hospital with a history of several days of progressive  right flank pain and on the day of admission noted increased weakness,  orthostasis and chills. She was brought to the emergency room where she was  noted to be afebrile but hypotensive with a pressure of 86/62. She had taken  her lisinopril that day. However, she had leukocytosis and urinalysis with  too numerous to count WBCs, many bacteria and trichomonas. She received  Levaquin in the emergency room and IV fluid. The patient's blood pressure  remained low. She was also noted to have a hemoglobin initially of 10.6  which fell to a low of 8.2 post hydration. Her BUN was elevated at 35 with a  creatinine of 3.1 which improved to BUN of 12 with a creatinine of 1.6 post  hydration. The patient's lisinopril was held during the hospital stay  secondary to hypotension. She had been seen in nephrology in the past and  actually had a creatinine clearance of 51 noted in August 2006 when she was  hospital with anemia and UTI. Subsequent blood cultures grew Klebsiella  pneumonia as did her urine. Despite the fact that the patient was sensitive  to fluoroquinolones, she continued to have afternoon fevers upwards of 101.  Her white count returned to normal, and she clinically felt improved  including resolution of her right flank pain and weakness. She had no  further orthostasis in the hospital. She received 2 g of Rocephin and Keflex  to continue with her Levaquin as an outpatient. She was instructed to return  to the hospital or to call should she have any worsening symptoms or  recurrent fever. A followup urinalysis revealed many bacteria. However, it  was contaminated with menstrual blood. She had too numerous to count red  cells and a few white cells. Because of her clinical improvement and the  fact that she lived locally, it was felt that it would be reasonable to  attempt outpatient management of her pyelonephritis. An ultrasound was  performed to rule out any evidence of collecting system/abscess which was  negative. Unfortunately due to her creatinine, a CT with contrast could not  be  performed. The patient was treated with a one-time dose of Flagyl for the  incidental finding of trichomonas. She had no symptoms and finding was  discussed with the patient including need to treat sexual partners though  she denied any recent contacts.  In August, the patient grew Escherichia  coli from her urine, and given this time noted to be Klebsiella. Therefore,  cranberry juice is probably of no significant benefit. The patient's p.o.  intake was stable. She had no other complaints during the hospital stay. She  is being discharged to home with follow up arranged with nephrology as an  outpatient, and she was encouraged to maintain regular follow up. She was  also scheduled to return to her primary care physician, Dr. Cindie Laroche, in one  to two weeks. She will need followup BMP, CBC and monitoring of her medical  status and hypertension off medications.    MEDICATIONS:  Her current medication regimen will include:  1.  Levaquin 500 mg daily to complete a two week course.  2.  Keflex 500 mg q.i.d.  3.  Lamictal 150 mg daily.  4.  Prozac 50 mg daily.  5.  Nu-Iron 150 mg for 1 week and then increase to twice daily.  6.  Flexeril 10 mg q.8h. p.r.n. back pain which she has been taking on a      regular basis.  7.  Percocet 1 p.o. q.8h. p.r.n. pain #30 given.   She was previously on Voltaren prior to her admission. Because of the  potential renal issues, this was held. She was encouraged to increase her  fiber and her fluid in her diet and to use Senokot S as needed for  constipation. She is being discharged to home with a plan as noted above.   At the time of discharge is a pleasant, alert female in no distress.  Temperature 98.5, blood pressure is 116/60, pulse regular, respirations  unlabored. Exam is somewhat limited due to her obesity. She is alert,  pleasant and nonfocal, able to move about without difficulty. She has no  edema noted.      Vanetta Mulders Hillery Jacks, M.D.  Electronically Signed     FED/MEDQ  D:  03/16/2005  T:  03/16/2005  Job:  [id number]   cc:   Alison Murray, M.D.  Fax: [phone]

## 2010-08-06 NOTE — Consult Note (Signed)
   NAME:  [name], [name]                       ACCOUNT NO.:  [id number]   MEDICAL RECORD NO.:  [id number]                   PATIENT TYPE:  INP   LOCATION:  A332                                 FACILITY:  [facility]   PHYSICIAN:  J. Sanjuana Kava, M.D.              DATE OF BIRTH:  April 21, 1965   DATE OF CONSULTATION:  DATE OF DISCHARGE:  11/08/2001                                   CONSULTATION   DISCHARGE DIAGNOSES:  1. Torn rotator cuff muscle, right shoulder.  2. Hypertension.   PROCEDURE:  Repair of rotator cuff muscle, right shoulder.   DISCHARGE STATUS:  Good.   PROGNOSIS:  Good.   DISCHARGE DISPOSITION:  The patient is discharged home.  She is to follow up  in our office in approximately two weeks for suture removal from right  shoulder.   DISCHARGE MEDICATIONS:  Tylox one tablet q.4h. p.r.n. pain.   BRIEF HISTORY AND PHYSICAL:  Please refer to the dictated History and  Physical on the patient's chart.   HOSPITAL COURSE:  The patient is a 45 year old white female who has had  right shoulder pain for quite some time.  She underwent an MRI showing that  she had a torn rotator cuff muscle.  She underwent repair of the rotator  cuff muscle on November 06, 2001 under general anesthetic and she tolerated  the procedure very well.  Postoperatively she was admitted to the hospital  for pain control.   She was placed on PCA pump which controlled the patient's pain of the right  shoulder.  On postoperative day #1 she was gotten out of bed and ambulated  in the hall.  On postoperative day #2 the patient was discharged home,  afebrile, in satisfactory condition.   INSTRUCTIONS:  She was asked to continue the shoulder immobilizer to the  right upper extremity.  No heavy lifting with that extremity.  She may take  the arm out of the shoulder immobilizer to extend and flex her elbow.  Sleep  in a semi-reclined position, put ice to the shoulder as needed for pain  relief and swelling.   Keep the wound clean and dry.    FOLLOW-UP:  She is to come to our office in approximately two weeks for  suture removal.  If she has any problems she is to contact us through the  hospital beeper system or through our office.      Adolphus Birchwood, Ambrose Pancoast, M.D.    BB/MEDQ  D:  12/11/2001  T:  12/11/2001  Job:  [id number]

## 2010-08-06 NOTE — Op Note (Signed)
[name], [name]             ACCOUNT NO.:  [id number]   MEDICAL RECORD NO.:  [id number]          PATIENT TYPE:  INP   LOCATION:  A207                          FACILITY:  [facility]   PHYSICIAN:  R. Garfield Cornea, M.D. DATE OF BIRTH:  [birth year]   DATE OF PROCEDURE:  10/16/2004  DATE OF DISCHARGE:                                 OPERATIVE REPORT   PROCEDURE:  Esophagogastroduodenoscopy and colonoscopy.   INDICATIONS:  The patient is a 45 year old lady admitted to the hospital  with possible viral syndrome versus early urosepsis who was been having  acute on chronic anemia and is hemoccult positive.  She has a microcytic  iron-deficiency picture although clinically she has not had any bleeding.  An EGD and colonoscopy are now being done.  This approach has been discussed  with the patient previously.  The potential risks, benefits and alternatives  have been reviewed.  Questions answered and she is agreeable.  Please see  documentation in the medical record.   DESCRIPTION OF PROCEDURE:  Oxygen saturation, blood pressure, pulse and  respiration were monitored throughout both procedures.  Conscious sedation  with IV Versed 5 mg and Demerol 75 mg IV in divided doses.  The instrument  was the Olympus videochip system.   EGD FINDINGS:  Esophagus:  Examination of the tubular esophagus revealed no  mucosal abnormalities.  The EG junction was easily traversed.  Stomach:  The gastric cavity was emptied and insufflated well with air.  A  thorough examination of the gastric mucosa including retroflexed view of the  proximal stomach was undertaken.  The patient stomach has been surgically  altered and partition closed with clips.  Sutures were pressed, protruding  through the mucosa.  The gastric cavity was small.  The outlet was patent  and the efferent small bowel limb appeared entirely normal for several  centimeters.  There was no other partition mucosa to visualize.  The  residual gastric  mucosa appeared normal aside from a small hiatal hernia  seen retroflexed.  The patient tolerated the procedure well and was prepared  for colonoscopy.   COLONOSCOPY:  Digital rectal exam revealed no abnormalities.   ENDOSCOPIC FINDINGS:  Prep was adequate.  Rectum:  Examination of the rectal mucosa including retroflexed view of the  anal verge revealed no abnormalities.  Colon:  Colonic mucosa was surveyed from the rectosigmoid junction through  the left, transverse,  right colon to the area of the appendiceal orifice,  ileocecal valve and appendiceal orifice.  These structures were well seen  and photographed for the record.  The terminal ileum was intubated at 20 cm.  From this level the scope was slowly withdrawn.  All previously mentioned  mucosal surfaces were again seen.  The rectal mucosa appeared normal.  The  terminal ileum mucosa appeared normal.  The colonic mucosa appeared normal.  The patient tolerated both procedures well.  She reacted to endoscopy.   ESOPHAGOGASTRODUODENOSCOPY FINDINGS:  1.  Normal esophagus.  2.  Small hiatal hernia.  3.  Surgically altered stomach consistent with prior gastric obesity      procedure.  4.  Normal residual gastric mucosa.  5.  Patent normal-appearing anastomosis/efferent small bowel limb.   COLONOSCOPY FINDINGS:  Normal rectum to terminal ileum.   IMPRESSION:  I doubt this lady has had a significant GI bleed acutely.  I  suspect her anemia is multifactorial.  It has been present for over one  year.  Menstrual loss, renal insufficiency/chronic disease may be underlying  contributing factors   RECOMMENDATIONS:  To complete the work-up for GI bleeding, will arrange for  having Gibbons capsule study of small bowel electively in a later date.  Her  hemoglobin is up to 9.4 after receiving two units of packed red blood cells.  Her white count is down to 19,400.  Alk phos is barely above normal at 126.  AST is 22, ALT 14.        RMR/MEDQ  D:  10/16/2004  T:  10/16/2004  Job:  [id number]   cc:   Tesfaye D. Legrand Rams, [place]  [place]  [place] [address]  Fax: [phone]

## 2010-08-06 NOTE — Discharge Summary (Signed)
NAMEUNNAMED, FORSYTH             ACCOUNT NO.:  [id number]   MEDICAL RECORD NO.:  [id number]          PATIENT TYPE:  IPS   LOCATION:  0605                          [facility]ILITY[facility]   PHYSICIAN:  Norm Salt, MD  DATE OF BIRTH:  1965-09-01   DATE OF ADMISSION:  09/25/2006  DATE OF DISCHARGE:  09/29/2006                               DISCHARGE SUMMARY   IDENTIFYING DATA AND REASON FOR ADMISSION:  The patient is a 45 year old  single African American female who was admitted due to increasing  suicidal ideation and depression.  She came to [place] on a regimen of  Lamictal, Prozac, Seroquel, and Klonopin, prescribed by Dr. Toy Cookey, her  psychiatrist.  She also had been seeing a therapist who recommended  inpatient treatment.  Please refer to the admission note for further  details pertaining to the symptoms, circumstances and history that led  to her hospitalization.  She was given an initial Axis I diagnosis of  major depressive disorder, recurrent without psychotic features.   MEDICAL AND LABORATORY:  The patient was medically and physically  assessed by the psychiatric nurse practitioner.  She was in good health  without any active or chronic medical problems.   HOSPITAL COURSE:  The patient was admitted to the Adult Inpatient  Psychiatric Service.  She presented as a well-nourished, well-developed  woman who stated I can't figure out why I've attempted suicide 4 times  and not succeeded.  She stated that she wished she had succeeded in one  of those attempts.  She denied any previous inpatient or hospitalization  history following prior suicide attempts because I didn't tell anyone.  She appeared fully oriented, with depressed mood and sad affect.  Otherwise, she was pleasant.  There were no signs or symptoms of  psychosis or thought disorder.  She indicated that she was still having  suicidal thoughts but contracted for safety, and verbalized a strong  desire for help.   She was  involved in milieu therapy and was a good participant in  treatment program.  She was treated with a psychotropic regimen of  Lamictal, Prozac, and Seroquel, all of which were well tolerated.   By the third hospital day, the patient reported that she was beginning  to feel better, and was finding the groups helpful.  She stated that she  identified with other patients, and stated I have hope.  She denied  any further suicidal ideation.  She had spoken with her psychiatrist,  Dr. Toy Cookey, on the phone.  She was more relaxed, bright, and smiling.   On the fourth hospital day, there was a family session attended by the  patient, her 2 sisters and her brother.  Although the possibility of her  attending the intensive outpatient program had been discussed, she  announced in this meeting that she was not planning on going to that  program, due to concerns about the feasibility of transportation to and  from.  Her family was very supportive.  This appeared to uplift the  patient's spirits further.   The following day she was assessed, remained absent of suicidal  ideation, and felt ready for continuation of treatment in the outpatient  setting.  She agreed to the following aftercare plan.   AFTERCARE:  The patient was to follow up with her usual therapist,  Lyndee Leo, on September 30, 2006, the day after discharge.  She was to have a  followup appointment with Dr. Toy Cookey on October 27, 2006, for medication  management.   DISCHARGE MEDICATIONS:  1. Lamictal 200 mg daily.  2. Prozac 60 mg daily.  3. Seroquel 100 mg q.h.s.   DISCHARGE DIAGNOSES:  AXIS I: Bipolar disorder, type 2, most recently  depressed.  AXIS II: Deferred.  AXIS III: No acute or chronic illnesses.  AXIS IV: Stressors severe.  AXIS V: GAF on discharge 60.      Norm Salt, MD  Electronically Signed     SPB/MEDQ  D:  10/18/2006  T:  10/19/2006  Job:  [id number]

## 2010-08-06 NOTE — H&P (Signed)
[name], [name]             ACCOUNT NO.:  [id number]   MEDICAL RECORD NO.:  [id number]          PATIENT TYPE:  INP   LOCATION:  A322                          FACILITY:  [facility]   PHYSICIAN:  Edward L. Luan Pulling, M.D.DATE OF BIRTH:  Jan 27, 1966   DATE OF ADMISSION:  03/12/2005  DATE OF DISCHARGE:  LH                                HISTORY & PHYSICAL   REASON FOR ADMISSION:  Probable urosepsis.   HISTORY OF PRESENT ILLNESS:  Ms. [name] is a 45 year old who has a history  of multiple problems including morbid obesity, status post gastric bypass  surgery, history of urosepsis, anemia, hypertension, bipolar disease and  degenerative joint disease.  She says that she has been feeling bad for  about 2 weeks with just nonspecific symptoms including dizziness and she has  had increasing problems today.  She was brought to the emergency room today  for evaluation and was found to be hypotensive with blood pressure running  in the 80s, very dizzy and just did not seem to be doing well.  She was  sluggish and not as responsive as she typically is and after evaluation, it  appeared that she had urosepsis and she is being admitted for that.   PAST MEDICAL HISTORY:  1.  Anemia.  2.  Morbid obesity.  3.  Hypertension.  4.  Frequent urinary tract infections.  5.  Degenerative joint disease.  6.  Depression.  7.  Acute on chronic renal failure.   MEDICATIONS:  1.  Not totally clear.  She says she is taking 50 mg of Prozac twice a day,      which is a very odd dose.  2.  Voltaren 75 mg b.i.d.  3.  Lisinopril 10 mg daily.  4.  Flexeril as needed for joint pain.   SOCIAL HISTORY:  She is not married.  She uses alcohol occasionally.  She  does not use tobacco.   FAMILY HISTORY:  Positive for obesity and apparently bipolar disease.   PHYSICAL EXAMINATION:  GENERAL:  Obese female who is in no acute distress  who looks somewhat chronically ill.  She rates her pain at an 8/10.  Pain is  diffuse.  VITAL SIGNS:  Temperature 97.5 orally, blood pressure 86/62, pulse 109,  respirations 16.  HEENT:  Pupils are reactive.  Nose and throat are clear.  NECK:  Supple without masses.  Tympanic membranes are intact.  She does not  have any bruits or jugular venous distention.  CHEST:  Clear without wheezes, rales or rhonchi.  Distant breath sounds.  HEART:  Sounds are distant.  ABDOMEN:  Minimally tender diffusely.  EXTREMITIES:  Showed no edema.  Pulses are intact.  NEUROLOGIC:  Sluggish, but responsive.   LABORATORY DATA AND X-RAY FINDINGS:  White blood count 21,000, hemoglobin  10.6, platelets 245.  CMP shows creatinine is 3.1, BUN 35, sodium 134,  glucose 123.  Alk phos 243.  Urinalysis shows trace ketones, small amount of  blood, negative nitrite, moderate leukocytes.  Urine microscopic shows rbc's  21-50, many bacteria, too numerous to count wbc's.  On her last admission,  it was mentioned that she had elevated BUN and creatinine, but that these  corrected.  I do not have access to the actual numbers so I do not know how  much better it got.   PLAN:  My plan is to go ahead and treat her with intravenous fluids at a  fairly rapid rate and give her antibiotics.  I am going to hold on her  regular medications until we have a better idea exactly what she is taking  and how.  She has had blood culture and urine culture and will continue with  treatment otherwise.      Edward L. Luan Pulling, M.D.  Electronically Signed     ELH/MEDQ  D:  03/12/2005  T:  03/13/2005  Job:  [id number]

## 2010-08-06 NOTE — H&P (Signed)
[name], [name]             ACCOUNT NO.:  [id number]   MEDICAL RECORD NO.:  [id number]          PATIENT TYPE:  INP   LOCATION:  A207                          FACILITY:  [facility]   PHYSICIAN:  Tesfaye D. Legrand Rams, MD   DATE OF BIRTH:  November 01, 1965   DATE OF ADMISSION:  10/14/2004  DATE OF DISCHARGE:  LH                                HISTORY & PHYSICAL   CHIEF COMPLAINT:  Diffuse pain over her hip, abdomen and shoulder area.   HISTORY OF PRESENT ILLNESS:  This is a 45 year old female patient with  history of multiple medical illnesses.  Came to the emergency room with the  above complaints.  The patient had ongoing nonspecific abdominal control of  pain for the last two or three weeks.  She was recently seen in the  emergency room where she had CT scan of the abdomen and pelvis.  Her test  results were negative for any acute episode.  However, the patient continued  to complain of pain especially on the right costal area.  The pain had  character 6 of pleurisy. She was started on nonsteroidal anti-inflammatory  medications as well as hydrocodone.  The patient, however, continued to have  persistent pain, especially in the last 24 hours.  The patient started  having more abdominal pain and generalized weakness.  She had also low-grade  fever.  The patient came to the emergency room where she was evaluated this  morning and was found to have fever of 100.5 with leukocytosis of 18,000.  Her urine was grossly abnormal.  While the patient was being evaluated in  the emergency room, she became hypotensive and tachycardic.  She was given a  bolus of fluid and then started on IV antibiotics.  The patient is admitted  under telemetry for close monitoring and further treatment.   REVIEW OF SYSTEMS:  The patient had a headache, fever, chills.  No nausea,  vomiting, shortness of breath or palpitations.  The patient complains of  lower abdominal pain and right flank pain.  She had dark urine with  some  frequency.  However, the patient does not notice dysuria.   PAST MEDICAL HISTORY:  1.  Anemia.  2.  Morbid obesity, status post endoscopic bypass surgery.  3.  Hypertension.  4.  History of frequent urinary tract infection.  5.  History of degenerative joint disease.  6.  Depression disorder.   CURRENT MEDICATIONS:  1.  Lotensin 10/12.5 one tablet p.o. daily.  2.  Ferrous sulfate 325 mg p.o. b.i.d.  3.  Flexeril 10 mg p.o. q.6h.  4.  Prozac daily.  5.  Lamictal daily.  6.  Voltaren b.i.d.   SOCIAL HISTORY:  The patient is single.  No history of alcohol, tobacco or  substance abuse.   PHYSICAL EXAMINATION:  GENERAL:  The patient is alert, awake and is sick  looking.  VITAL SIGNS: Blood pressure 85/46.  Pulse 107.  Respiratory rate 23.  Temperature 100.__________ degrees Fahrenheit.  HEENT:  Pupils are equal, reactive.  NECK:  Supple.  CHEST:  Decreased air entry.  Bilateral rhonchi.  CARDIOVASCULAR:  First and second heart sounds heard.  No murmur, no gallop.  ABDOMEN:  Obese, soft and lax.  There is right lower quadrant tenderness.  Right flank tenderness as well.  EXTREMITIES:  No leg edema.   LABORATORY DATA:  On admission WBC 18, hemoglobin 8.7, hematocrit 27.7,  platelets 296.  Sodium 135, potassium 3.6, chloride 106, carbon dioxide 20,  glucose 114, BUN 28, creatinine 2.1 and calcium 7.7.  Urinalysis:  Specific  gravity 1.020, pH 5, nitrites positive, leukocyte moderate, bacteria few,  wbc's too-numerous-to-count.   ASSESSMENT:  1.  Possibly urosepsis with hypotension.  2.  Mild renal insufficiency.  3.  Microcytic anemia.  4.  History of endoscopic bypass surgery.  5.  History of hypertension.  6.  History of depression disorder.   PLAN:  Would continue the patient on IV fluid.  Will continue hydration and  would monitor her electrolytes.  Will continue the patient on IV  antibiotics.  Would continue her regular medications.       TDF/MEDQ  D:   10/14/2004  T:  10/15/2004  Job:  [id number]

## 2010-08-06 NOTE — Op Note (Signed)
NAME:  [name], [name]                       ACCOUNT NO.:  [id number]   MEDICAL RECORD NO.:  [id number]                   PATIENT TYPE:  AMB   LOCATION:  DAY                                  FACILITY:  [facility]   PHYSICIAN:  J. Sanjuana Kava, M.D.              DATE OF BIRTH:  03-18-1966   DATE OF PROCEDURE:  11/06/2001  DATE OF DISCHARGE:                                 OPERATIVE REPORT   PREOPERATIVE DIAGNOSIS:  Tear rotator cuff right.   POSTOPERATIVE DIAGNOSIS:  Tear rotator cuff right.   PROCEDURE:  1. A Neer acromioplasty.  2. Repair of rotator cuff right.   SURGEON:  J. Sanjuana Kava, M.D.   ASSISTANT:  Adolphus Birchwood, [facility]   ANESTHESIA:  General.   DRAINS:  None   INDICATIONS:  Fascial pain and tenderness in the right shoulder.  MRI shows  tear of rotator cuff right.  The patient is not improved with conservative  treatment.  Surgery is recommended as she has not improved with conservative  treatment and rest, anti-inflammatories, and physical therapy.  The risks  and imponderables of the procedure have been discussed with the patient in  detail, and the patient appears to understand and agreed to the procedure as  outlined.   DESCRIPTION OF PROCEDURE:  The patient was given general anesthesia and  placed supine on the operating room table and placed in a semi-barber's  position making sure that she is well padded around the knees and her feet  and her shoulder was free to be moved.  The patient was prepped and draped  in the usual manner.  An outline for an incision was made between the  coracoid into the clavicle.  An incision was done and it readily confirmed  that the patient is having a right rotator cuff procedure.  Incision was  then made.  With careful dissection the subcutaneous tissue was exposed and  it as rather thickened.  The deltoid was exposed.  Subcutaneously the  adipose tissue was retracted and a suture was placed 5 cm below the [facility] joint  to make  sure that there was no injury to the axillary nerve.  A so-called  weak spot in the deltoid was then opened.  The coracoacromial ligament was  identified and this was sharply removed.  Many segments of the acromion and  the [facility] joint there was a tightness and using a broad based osteotome and a  portion of the acromion and area at the [facility] joint was removed as described by  Neer.  Good smooth contour was obtained using a power rasp and a hand rasp.  Just getting this out there was significant free range of motion of the  shoulder.  A small tear of the supraspinatus at its insertion and this  portion was repaired using very long suture.  Good repair was obtained.  I  was able to get a good bite.  The patient was placed through a range of  motion and there seemed to be no further impingement or a tear.  The area  was irrigated with saline and the deltoid was reapproximated to itself using  3-0 chromic as described by Neer.  Subcutaneous tissue was reapproximated  using 2-0 plain.  This tag was removed within the plane.  Subcuticular  suture of 3-0 nylon was used and then the wound was cleansed and Steri-  Strips applied.  Bulky dressing applied.  The patient placed in a short  immobilizer.  She was awakened and taken to recovery in good condition.  The  patient is admitted, PCA pump has been ordered.                                               Iona Hansen, M.D.    JWK/MEDQ  D:  11/06/2001  T:  11/08/2001  Job:  [id number]

## 2010-08-06 NOTE — H&P (Signed)
NAME:  [name], [name]                       ACCOUNT NO.:  [id number]   MEDICAL RECORD NO.:  [id number]                   PATIENT TYPE:  AMB   LOCATION:  DAY                                  [facility]   PHYSICIAN:  J. Sanjuana Kava, M.D.              DATE OF BIRTH:  [birth year]   DATE OF ADMISSION:  11/06/2001  DATE OF DISCHARGE:                                HISTORY & PHYSICAL   CHIEF COMPLAINT:  Yes my shoulder hurts.   HISTORY OF PRESENT ILLNESS:  The patient has pain and tenderness in her  right shoulder. 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 She has been seen and evaluated  from the post office and is found to have an injury. She first saw her  family doctor, Dr. Jana Hakim.  Dr. Arnoldo Morale subsequently see the  patient. I first saw the patient on 07/05/01.  At that time I felt that she  might have a rotator cuff injury and recommended an MRI.  MRI was  subsequently done and a try at MRI showed marked thickening and single  alteration of supraspinatus tendon on the right.  This was felt to be due to  severe tendinosis with a tear on the distal anterior aspect of the tendon at  its insertion with the greater tuberosity.  She had DJD of the [facility] joint with  inflammation and os acromiale was present.  The patient was informed of the  findings on a subsequent visit of 04/25.  She was given Motrin and work  restrictions.  The patient's pain still continued and she still had  significant problems.  We recommended a course of physical therapy.   There was some delay in getting the therapy due to going through the federal  workers compensation program, but eventually this was begun and she had 7 or  8 visits to therapy.  She slowly improved slightly but was still having pain  and tenderness and the pain has recurred to such an extent that it is very  painful and she has difficulty sleeping and is unable to do her  job.   It has taken several weeks to get approval from workers compensation to  proceed with the surgery through the federal workers compensation system,  but it has been approved.  The patient has difficulty sleeping, difficulty  using her arm and pain.  Medications have not helped.   PAST MEDICAL HISTORY:  Positive for hypertension.   ALLERGIES:  She denies any allergies.   MEDICATIONS:  She currently takes Lotensin, HCTZ 10/12.5 one a day, Celexa  10 mg 1 a day, and Vicodin 5/500 for pain.   SOCIAL HISTORY:  She does not smoke and does not drink.  Dr. Arnoldo Morale is her  family doctor.  She had a gastric bypass in 2000 by Dr. Sherryle Lis at [facility]  [place] HISTORY:  Hypertension runs in the family.  She has a sister with  diabetes.  Her date of injury on the job was 05/27/01.  She was at the post  office and it has been verified from the post office as a workers  compensation injury.   The patient is not married, lives in [place] and works in [place].   PHYSICAL EXAMINATION:   VITAL SIGNS:  The patient is afebrile.  Respirations 20, BP is 124/88.  Pulse of 80.  Height 5 feet and weight 259.   HEENT:  Negative.   MENTAL STATUS:  The patient is alert, cooperative, and oriented.   NECK:  Supple.   LUNGS:  Clear to P&A.   HEART:  Regular rhythm without murmur.   ABDOMEN:  Soft and tender without masses.   MUSCULOSKELETAL:  Range of motion of the right shoulder is very painful.  In  abduction, approximately 20 degrees is painful.  External rotation is full.  Internal rotation is painful at 5 degrees and beyond.  Forward flexion is  very limited and painful.  She is diffusely tender around the shoulder.  Range of motion of the neck is full.  Grip strength is normal.  Lower back  is negative and upper extremities are negative.   NEUROLOGIC:  CNS is intact.   SKIN:  Intact.   IMPRESSION:  1. Rotator cuff tear on the right.  2. Hypertension.   PLAN:   Repair of rotator cuff above the knee.  I discussed with the patient  the planned procedure risk and imponderables in the office, in detail, with  her.  She appears to understand the procedure as outlined.  Labs are  pending.                                               Iona Hansen, M.D.    JWK/MEDQ  D:  11/05/2001  T:  11/05/2001  Job:  [id number]

## 2010-08-06 NOTE — Consult Note (Signed)
[name], [name]             ACCOUNT NO.:  [id number]   MEDICAL RECORD NO.:  [id number]          PATIENT TYPE:  INP   LOCATION:  A207                          FACILITY:  [facility]   PHYSICIAN:  Alison Murray, M.D.DATE OF BIRTH:  09-16-1965   DATE OF CONSULTATION:  10/15/2004  DATE OF DISCHARGE:                                   CONSULTATION   REFERRING PHYSICIAN:  Tesfaye D. Legrand Rams, M.D.   REASON FOR CONSULTATION:  Renal insufficiency.   HISTORY OF PRESENT ILLNESS:  [name] is a 45 year old, African-American  with past medical history of hypertension, history of morbid obesity and  anemia presently admitted to the hospital because of abdominal pain,  generalized weakness and poor appetite.  According to the patient, she  started having generalized aching and because of that, she came to the  emergency room.  She was sent home with some pain medication.  However, the  patient again came back after she started having some fever and chills.  In  the emergency room, the patient was found to have fever, leukocytosis and  hypotension, hence, the possibility of sepsis was entertained.  The patient  is admitted to the hospital.  At this moment, consult is called because of  elevated BUN and creatinine.  According to the patient, in January of this  year, she was in the emergency room and she was told kidney was working  about 50%.  However, the etiology and also the cause of her renal  insufficiency was not clear.   PAST MEDICAL HISTORY:  1.  History of iron deficiency anemia.  2.  History of hypertension.  3.  History of morbid obesity, status post endoscopic gastric bypass.  4.  History of recurrent urinary tract infection.  5.  History of degenerative joint disease.  6.  History of depression.   PAST SURGICAL HISTORY:  1.  Status post right rotator cuff surgery.  2.  Status post endoscopic bypass surgery.   SOCIAL HISTORY:  No history of smoking, no history of alcohol  abuse.  No  history of drug use.   ALLERGIES:  CODEINE.   CURRENT MEDICATIONS:  1.  Ferrous sulfate 325 mg p.o. q.d.  2.  IV fluid 100 cc per hour.  3.  Imodium 1 mg p.o. on p.r.n. basis.  4.  Oxycodone.   FAMILY HISTORY:  No family history of renal insufficiency or diabetes.   REVIEW OF SYSTEMS:  CONSTITUTIONAL:  Still has complaints of weakness.  Fever at night.  GASTROINTESTINAL:  Some abdominal pain.  Watery diarrhea  about 3-4 times a day.  No nausea or vomiting.  MUSCULOSKELETAL:  Back pain.  Swelling of her legs.  Recently, since she was put on blood pressure  medication, the patient does not have any edema.   PHYSICAL EXAMINATION:  VITAL SIGNS:  Temperature 98.9, pulse 106, blood  pressure 102/66.  Her blood pressure was low yesterday at 95/47 with  temperature of 100.3.  CHEST:  Clear to auscultation with no rales or rhonchi.  No egophony.  HEART:  Regular rate and rhythm with no murmur.  ABDOMEN:  Soft, positive bowel sounds.  EXTREMITIES:  No edema.   LABORATORY DATA AND X-RAY FINDINGS:  White blood cell count is 22,  hemoglobin 7.7, hematocrit 23.3.  Sodium 136, potassium 3.9, BUN 36,  creatinine 2.9.  On July 8, her creatinine was 2.4.  Yesterday, her BUN was  28, creatinine 2.1.  Her calcium is 7.2.  UA from yesterday with specific  gravity 1.02, pH 5, nitrite positive, moderate leukocytes.  Culture done  yesterday did not show any high growth.  Stool is positive for blood.   ASSESSMENT:  1.  Renal insufficiency.  At this moment, I am not sure this is new or      chronic.  In June, her creatinine seems to be around 2.4.  By history,      she says that her creatinine was elevated in January.  A computed      tomography scan done during this time did not show any kidney stones or      hydronephrosis.  Hence, since her creatinine seems to be increasing      associated with hypotension and high urine specific gravity, will      probably may at least be dealing with  superimposed renal failure.  Acute      tubular necrosis and other etiologies at this moment cannot be ruled      out.  2.  History of anemia.  Possibly at this moment seems to be from      gastrointestinal bleeding.  3.  Possible urosepsis.  The patient is on antibiotics and also on      intravenous hydration.  Her blood pressure is better.  She had      significant leukocytosis.  At this moment with history of abdominal pain      and also positive blood in the stool, we may need to consider also      diverticulitis.  4.  History of hypertension.  Blood pressure seems to be, at this moment,      low.  Previously she was on lisinopril according to her.  5.  History of obesity, status post bypass surgery.  6.  History of depression.   RECOMMENDATIONS:  I agree with hydration.  Will increase her IV fluid to 225  cc per hour.  At this moment, possibly the patient may require GI  evaluation.  I agree with a blood transfusion.  We will try to a 24-hour  urine for creatinine clearance.  Will continue with other medication and if  her renal function continues to deteriorate, probably would repeat  ultrasound.      Alison Murray, M.D.  Electronically Signed     BB/MEDQ  D:  10/15/2004  T:  10/15/2004  Job:  [id number]

## 2010-08-06 NOTE — Discharge Summary (Signed)
NAMEEMMELYN, LEIBROCK                       ACCOUNT NO.:  [id number]   MEDICAL RECORD NO.:  [id number]                   PATIENT TYPE:  INP   LOCATION:  [id number]                                 FACILITY:  [facility]   PHYSICIAN:  J. Sanjuana Kava, M.D.              DATE OF BIRTH:  [birth year]   DATE OF ADMISSION:  06/24/2003  DATE OF DISCHARGE:  06/26/2003                                 DISCHARGE SUMMARY   DISCHARGE DIAGNOSIS:  Rotator cuff tear to the right shoulder, recurrent.   PROCEDURE:  Repair of rotator cuff, right shoulder.   CONDITION ON DISCHARGE:  Improved.   DISPOSITION:  Discharged to home.   DISCHARGE MEDICATIONS:  Tylenol No. 3 one to two every four hours p.r.n.  pain.   WOUND CARE:  Keep dressing dry.  Wear sling at all times.   FOLLOW UP:  Appointment at the office on July 08, 2003, at 1:30 p.m.   HOSPITAL COURSE:  The patient underwent rotator cuff repair on the day of  admission through the day surgery unit.  She tolerated it well.  She was  placed on a PCA.  The first day, PCA was controlling her pain, but she had  difficulty voiding.  She had in and out Foley.  She was still requiring the  PCA pump.  She was kept overnight and the following day she was afebrile  with very little pain to her arm.  She had no difficulty voiding and the  surgical wound looked good.  There was no erythema or drainage to the wound.  Care of the wound was explained to her.  She is to sleep semi-erect in a  chair and use the sling.  She has been through this procedure before and she  understands.   DISCHARGE LABORATORY DATA AND X-RAY FINDINGS:  Lab values within normal  limits.  EKG was negative.   SPECIAL INSTRUCTIONS:  If she has any difficulties, she is to contact me  through the office or hospital beeper system.     ___________________________________________                                         Iona Hansen, M.D.   JWK/MEDQ  D:  07/03/2003  T:  07/03/2003  Job:   [id number]

## 2010-08-06 NOTE — Consult Note (Signed)
[name], [name]             ACCOUNT NO.:  [id number]   MEDICAL RECORD NO.:  [id number]          PATIENT TYPE:  INP   LOCATION:  A207                          FACILITY:  [facility]   PHYSICIAN:  R. Garfield Cornea, M.D. DATE OF BIRTH:  Jul 24, 1965   DATE OF CONSULTATION:  10/15/2004  DATE OF DISCHARGE:                                   CONSULTATION   REASON FOR CONSULTATION:  Drop in hemoglobin and hematocrit.   REQUESTING PHYSICIAN:  Tesfaye D. Legrand Rams, M.D.   HISTORY OF PRESENT ILLNESS:  The patient is a 45 year old morbidly obese  black female with history of anemia (diagnosed October 2005), hypertension,  frequent UTIs, who presented with possible urosepsis, hypotension, mild  renal insufficiency, possibly chronic. She states she has not felt well over  the last three to four weeks. She was in the emergency department earlier in  the month with complaints of abdominal pain. At that time, she had a non-IV  contrast CT which revealed a ventral hernia with no evidence of obstruction  or incarceration. Otherwise unremarkable. She came back to the emergency  room after having some fever, feeling very weak, like she might pass out.  She was found to have some hypotension. Her urinalysis was positive for UTI.  She also had leukocytosis with white count of 18,000. She also complains of  abdominal pain primarily in the right abdomen which has been going on for a  couple of weeks. She has noted her stools are more loose or watery and has  had some mucus in it the last couple of weeks. She generally has one bowel  movement a day. With regards to abdominal pain, it is keeping her up at  night. It seems to be worse with movement and not related to meals.  Sometimes she has nausea and vomiting after having episodes of diarrhea. She  denies any gross blood per rectum or hematemesis. She denies any dysuria but  states her urine has been dark. Denies any heartburn. She states she has  gained about 10  to 15 pounds the last few weeks. She complains of night  sweats. She also presented to the emergency department yesterday because of  diffuse body aches in all of her large joints.   She has been on Voltaren for a while for her shoulder. She has never had an  EGD or colonoscopy. She started ferrous sulfate about a month ago.   When she came in, her hemoglobin was 8.8, hematocrit 27.7, MCV 72.2. Her  hemoglobin was the same several weeks before. Today, her hemoglobin was 7.7,  hematocrit 23.5, MCV 72.5. White count on admission was 18,000. Today, it  was 22,000. There was a left shift. BUN was 28, now 36. Creatinine 2.1, now  2.9. LFTs normal except alkaline phosphatase 170. Calcium low at 7.2. Her  stool was heme positive since admission. She has had a renal ultrasound  which was consistent with medical renal disease.   MEDICATIONS PRIOR TO ADMISSION:  1.  Voltaren b.i.d.  2.  Tylenol PM p.r.n.  3.  Lamictal.  4.  Prozac.  5.  Flexeril 10 mg q.6h.  6.  Iron 325 mg b.i.d.  7.  Lotensin 10/12.5 q.d.   ALLERGIES:  CODEINE caused nausea.   PAST MEDICAL HISTORY:  1.  Anemia diagnosed back in October 2005. Recently started ferrous sulfate      as outlined above.  2.  Morbid obesity status post gastric bypass in June 2000. She weighed in      the 400s and got down to 240 pounds postoperatively.  3.  Hypertension.  4.  Frequent urinary tract infections.  5.  Degenerative joint disease.  6.  Bipolar disorder.  7.  Status post right rotator cuff surgery twice.   FAMILY HISTORY:  Negative for colorectal cancer or chronic GI illnesses or  liver disease.   SOCIAL HISTORY:  She is single. She has no alcohol, tobacco, or drug use  history. She has no children. She works for the post [employer].   REVIEW OF SYSTEMS:  See HPI for GI and constitutional. CARDIOPULMONARY:  Denies chest pain or shortness of breath. GENITOURINARY:  Denies dysuria.  She has an IUD in place for the past five years  and is due for a change this  year. She states with this she has very regular cycles with minimal vaginal  bleeding.   PHYSICAL EXAMINATION:  VITAL SIGNS:  T-max 100.5, T-current 98.9, pulse 106,  respirations 20, blood pressure 102/66, weight 263.2, height 62 inches.  GENERAL:  Pleasant, morbid obese, young, black female in no acute distress.  SKIN:  Warm and dry. No jaundice.  HEENT:  Conjunctivae are pale. Sclerae are nonicteric. Oropharyngeal mucosa  moist and pink. No lesions, erythema, or exudate. No lymphadenopathy or  thyromegaly.  CHEST:  Lungs are clear to auscultation.  CARDIAC:  Regular rate and rhythm. Normal S1 and S2. No murmurs, rubs, or  gallops.  ABDOMEN:  Obese but symmetrical, soft. She has mild tenderness to palpation  in the right lower costal margin. No other tenderness noted. No abdominal  bruits, masses, or organomegaly.  EXTREMITIES:  No edema.   LABORATORY DATA:  As mentioned in HPI. In addition, sodium 136, potassium  3.6, total bilirubin 0.9, alkaline phosphatase 170. AST 21, ALT 15, albumin  3.5.   IMPRESSION:  The patient is a 45 year old lady with multiple problems.   1.  She has microcytic anemia and has been on iron for one month. Her anemia      dates back to at least nine months ago. Iron and anemia studies are      pending. She is hemoccult positive since admission. She has been on      NSAIDs. GI blood loss could be from numerous etiologies including peptic      ulcer disease, colonic polyp, AVMs, or even neoplasm. Diarrhea seems to      be quite limited. Therefore, I suspect colitis most likely. She needs to      have colonoscopy and EGD for further evaluation.  2.  Right sided abdominal pain which is better. On examination, she is      really tender over the right lower costal margin. Nothing on the CT to      explain. 3.  Leukocytosis is some worse today. Urinalysis is suggestive of urinary      source. She has received one dose of Levaquin  thus far.  4.  Elevated alkaline phosphatase, possibly non-GI source. Would monitor.  5.  Hypocalcemia. Question etiology. Workup per PCP.   RECOMMENDATIONS:  1.  Follow up stool studies.  2.  EGD and colonoscopy tomorrow.  3.  Transfuse as needed.  4.  Question continued antibiotic therapy. Will have nursing staff call Dr.      Legrand Rams.  5.  CBC and LFTs in the morning.  6.  Continue Protonix.   I would like to thank Dr. Legrand Rams for allowing [place] to take part in the care of  this patient.       LL/MEDQ  D:  10/15/2004  T:  10/15/2004  Job:  [id number]

## 2010-08-26 ENCOUNTER — Emergency Department (HOSPITAL_COMMUNITY)
Admission: EM | Admit: 2010-08-26 | Discharge: 2010-08-26 | Disposition: A | Discharge status: Home / Self Care | Payer: Federal, State, Local not specified - PPO | Attending: Emergency Medicine | Admitting: Emergency Medicine

## 2010-08-26 ENCOUNTER — Emergency Department (HOSPITAL_COMMUNITY): Payer: Federal, State, Local not specified - PPO

## 2010-08-26 DIAGNOSIS — Z79899 Other long term (current) drug therapy: Secondary | ICD-10-CM | POA: Insufficient documentation

## 2010-08-26 DIAGNOSIS — F29 Unspecified psychosis not due to a substance or known physiological condition: Secondary | ICD-10-CM | POA: Insufficient documentation

## 2010-08-26 DIAGNOSIS — I10 Essential (primary) hypertension: Secondary | ICD-10-CM | POA: Insufficient documentation

## 2010-08-26 DIAGNOSIS — G40909 Epilepsy, unspecified, not intractable, without status epilepticus: Secondary | ICD-10-CM | POA: Insufficient documentation

## 2010-08-26 LAB — BASIC METABOLIC PANEL
BUN: 33 mg/dL — ABNORMAL HIGH (ref 6–23)
CO2: 25 mEq/L (ref 19–32)
Calcium: 8.4 mg/dL (ref 8.4–10.5)
Chloride: 107 mEq/L (ref 96–112)
Creatinine, Ser: 1.52 mg/dL — ABNORMAL HIGH (ref 0.4–1.2)
GFR calc Af Amer: 45 mL/min — ABNORMAL LOW (ref >60)
GFR calc non Af Amer: 37 mL/min — ABNORMAL LOW (ref >60)
Glucose, Bld: 105 mg/dL — ABNORMAL HIGH (ref 70–99)
Potassium: 4.4 mEq/L (ref 3.5–5.1)
Sodium: 139 mEq/L (ref 135–145)

## 2010-08-26 LAB — POCT PREGNANCY, URINE: Preg Test, Ur: NEGATIVE

## 2010-09-17 ENCOUNTER — Other Ambulatory Visit: Payer: Self-pay | Admitting: Gastroenterology

## 2010-09-17 ENCOUNTER — Ambulatory Visit (HOSPITAL_COMMUNITY)
Admission: RE | Admit: 2010-09-17 | Discharge: 2010-09-17 | Disposition: A | Discharge status: Home / Self Care | Payer: Federal, State, Local not specified - PPO | Source: Ambulatory Visit | Attending: Gastroenterology | Admitting: Gastroenterology

## 2010-09-17 DIAGNOSIS — K648 Other hemorrhoids: Secondary | ICD-10-CM | POA: Insufficient documentation

## 2010-09-17 DIAGNOSIS — Z9884 Bariatric surgery status: Secondary | ICD-10-CM | POA: Insufficient documentation

## 2010-09-17 DIAGNOSIS — K209 Esophagitis, unspecified without bleeding: Secondary | ICD-10-CM | POA: Insufficient documentation

## 2010-09-17 DIAGNOSIS — R195 Other fecal abnormalities: Secondary | ICD-10-CM | POA: Insufficient documentation

## 2010-09-17 DIAGNOSIS — R198 Other specified symptoms and signs involving the digestive system and abdomen: Secondary | ICD-10-CM | POA: Insufficient documentation

## 2010-09-17 DIAGNOSIS — R197 Diarrhea, unspecified: Secondary | ICD-10-CM | POA: Insufficient documentation

## 2010-09-17 DIAGNOSIS — K644 Residual hemorrhoidal skin tags: Secondary | ICD-10-CM | POA: Insufficient documentation

## 2010-12-17 LAB — COMPREHENSIVE METABOLIC PANEL
ALT: 15
AST: 23
Albumin: 3.5
Alkaline Phosphatase: 221 — ABNORMAL HIGH
BUN: 18
CO2: 26
Calcium: 7.9 — ABNORMAL LOW
Chloride: 106
Creatinine, Ser: 1.39 — ABNORMAL HIGH
GFR calc Af Amer: 51 — ABNORMAL LOW
GFR calc non Af Amer: 42 — ABNORMAL LOW
Glucose, Bld: 91
Potassium: 3.8
Sodium: 138
Total Bilirubin: 0.3
Total Protein: 6.3

## 2010-12-17 LAB — CARDIAC PANEL(CRET KIN+CKTOT+MB+TROPI)
CK, MB: 7.6 — ABNORMAL HIGH
CK, MB: 8.9 — ABNORMAL HIGH
CK, MB: 9.4 — ABNORMAL HIGH
Relative Index: 0.9
Relative Index: 1
Relative Index: 1.3
Total CK: 701 — ABNORMAL HIGH
Total CK: 817 — ABNORMAL HIGH
Total CK: 881 — ABNORMAL HIGH
Troponin I: 0.02
Troponin I: 0.03
Troponin I: 0.05

## 2010-12-17 LAB — CBC
HCT: 33.7 — ABNORMAL LOW
HCT: 34 — ABNORMAL LOW
HCT: 36.8
Hemoglobin: 10.5 — ABNORMAL LOW
Hemoglobin: 10.8 — ABNORMAL LOW
Hemoglobin: 11.6 — ABNORMAL LOW
MCHC: 31.2
MCHC: 31.6
MCHC: 31.8
MCV: 81.5
MCV: 81.6
MCV: 81.7
Platelets: 196
Platelets: 202
Platelets: 209
RBC: 4.13
RBC: 4.17
RBC: 4.5
RDW: 15.8 — ABNORMAL HIGH
RDW: 16.2 — ABNORMAL HIGH
RDW: 16.4 — ABNORMAL HIGH
WBC: 5.8
WBC: 5.9
WBC: 6.1

## 2010-12-17 LAB — URINALYSIS, ROUTINE W REFLEX MICROSCOPIC
Bilirubin Urine: NEGATIVE
Glucose, UA: NEGATIVE
Hgb urine dipstick: NEGATIVE
Ketones, ur: NEGATIVE
Leukocytes, UA: NEGATIVE
Nitrite: NEGATIVE
Protein, ur: 30 — AB
Specific Gravity, Urine: 1.014
Urobilinogen, UA: 0.2
pH: 6.5

## 2010-12-17 LAB — CK TOTAL AND CKMB (NOT AT ARMC)
CK, MB: 5.3 — ABNORMAL HIGH
CK, MB: 6.7 — ABNORMAL HIGH
Relative Index: 0.9
Relative Index: 1.4
Total CK: 376 — ABNORMAL HIGH
Total CK: 785 — ABNORMAL HIGH

## 2010-12-17 LAB — BASIC METABOLIC PANEL
BUN: 14
BUN: 14
BUN: 15
CO2: 23
CO2: 24
CO2: 26
Calcium: 7.7 — ABNORMAL LOW
Calcium: 7.8 — ABNORMAL LOW
Calcium: 8 — ABNORMAL LOW
Chloride: 106
Chloride: 107
Chloride: 110
Creatinine, Ser: 1.2
Creatinine, Ser: 1.25 — ABNORMAL HIGH
Creatinine, Ser: 1.32 — ABNORMAL HIGH
GFR calc Af Amer: 54 — ABNORMAL LOW
GFR calc Af Amer: 57 — ABNORMAL LOW
GFR calc Af Amer: 60 — ABNORMAL LOW
GFR calc non Af Amer: 44 — ABNORMAL LOW
GFR calc non Af Amer: 47 — ABNORMAL LOW
GFR calc non Af Amer: 50 — ABNORMAL LOW
Glucose, Bld: 80
Glucose, Bld: 83
Glucose, Bld: 86
Potassium: 3.3 — ABNORMAL LOW
Potassium: 3.8
Potassium: 4
Sodium: 137
Sodium: 138
Sodium: 142

## 2010-12-17 LAB — DIFFERENTIAL
Basophils Absolute: 0.1
Basophils Relative: 1
Eosinophils Absolute: 0.3
Eosinophils Relative: 5
Lymphocytes Relative: 24
Lymphs Abs: 1.4
Monocytes Absolute: 0.8
Monocytes Relative: 13 — ABNORMAL HIGH
Neutro Abs: 3.3
Neutrophils Relative %: 57

## 2010-12-17 LAB — RETICULOCYTES
RBC.: 4.27
Retic Count, Absolute: 51.2
Retic Ct Pct: 1.2

## 2010-12-17 LAB — MAGNESIUM: Magnesium: 1.8

## 2010-12-17 LAB — VITAMIN B12: Vitamin B-12: 279 (ref 211–911)

## 2010-12-17 LAB — IRON AND TIBC
Iron: 65
Saturation Ratios: 21
TIBC: 314
UIBC: 249

## 2010-12-17 LAB — APTT: aPTT: 25

## 2010-12-17 LAB — URINE MICROSCOPIC-ADD ON

## 2010-12-17 LAB — PROTIME-INR
INR: 1
Prothrombin Time: 13.8

## 2010-12-17 LAB — TSH: TSH: 0.968

## 2010-12-17 LAB — FOLATE: Folate: 11.5

## 2010-12-17 LAB — PREGNANCY, URINE: Preg Test, Ur: NEGATIVE

## 2010-12-17 LAB — CK: Total CK: 2092 — ABNORMAL HIGH

## 2010-12-17 LAB — FERRITIN: Ferritin: 16 (ref 10–291)

## 2010-12-17 LAB — TROPONIN I: Troponin I: 0.03

## 2010-12-30 LAB — COMPREHENSIVE METABOLIC PANEL WITH GFR
ALT: 16
AST: 23
Albumin: 3 — ABNORMAL LOW
Alkaline Phosphatase: 365 — ABNORMAL HIGH
BUN: 23
CO2: 25
Calcium: 5.7 — CL
Chloride: 109
Creatinine, Ser: 1.73 — ABNORMAL HIGH
GFR calc non Af Amer: 33 — ABNORMAL LOW
Glucose, Bld: 159 — ABNORMAL HIGH
Potassium: 3.4 — ABNORMAL LOW
Sodium: 137
Total Bilirubin: 0.8
Total Protein: 5.9 — ABNORMAL LOW

## 2010-12-30 LAB — URINALYSIS, ROUTINE W REFLEX MICROSCOPIC
Bilirubin Urine: NEGATIVE
Glucose, UA: NEGATIVE
Hgb urine dipstick: NEGATIVE
Ketones, ur: NEGATIVE
Nitrite: NEGATIVE
Protein, ur: NEGATIVE
Specific Gravity, Urine: 1.025
Urobilinogen, UA: 0.2
pH: 5.5

## 2010-12-30 LAB — DIFFERENTIAL
Basophils Absolute: 0
Basophils Absolute: 0
Basophils Relative: 1
Basophils Relative: 1
Eosinophils Absolute: 0.4
Eosinophils Absolute: 0.4
Eosinophils Relative: 7 — ABNORMAL HIGH
Eosinophils Relative: 8 — ABNORMAL HIGH
Lymphocytes Relative: 25
Lymphocytes Relative: 38
Lymphs Abs: 1.3
Lymphs Abs: 2.2
Monocytes Absolute: 0.5
Monocytes Absolute: 0.6
Monocytes Relative: 10
Monocytes Relative: 11
Neutro Abs: 2.6
Neutro Abs: 2.9
Neutrophils Relative %: 44
Neutrophils Relative %: 57

## 2010-12-30 LAB — RAPID URINE DRUG SCREEN, HOSP PERFORMED
Amphetamines: NOT DETECTED
Barbiturates: NOT DETECTED
Benzodiazepines: POSITIVE — AB
Cocaine: NOT DETECTED
Opiates: NOT DETECTED
Tetrahydrocannabinol: NOT DETECTED

## 2010-12-30 LAB — HEPATIC FUNCTION PANEL
ALT: 15
AST: 22
Albumin: 2.7 — ABNORMAL LOW
Alkaline Phosphatase: 335 — ABNORMAL HIGH
Bilirubin, Direct: 0.1
Indirect Bilirubin: 0.6
Total Bilirubin: 0.7
Total Protein: 5.2 — ABNORMAL LOW

## 2010-12-30 LAB — BASIC METABOLIC PANEL WITH GFR
BUN: 17
CO2: 23
Calcium: 6 — CL
Chloride: 111
Creatinine, Ser: 1.61 — ABNORMAL HIGH
GFR calc non Af Amer: 35 — ABNORMAL LOW
Glucose, Bld: 81
Potassium: 3.6
Sodium: 143

## 2010-12-30 LAB — VITAMIN B12: Vitamin B-12: 178 — ABNORMAL LOW (ref 211–911)

## 2010-12-30 LAB — IRON AND TIBC
Iron: 34 — ABNORMAL LOW
Saturation Ratios: 12 — ABNORMAL LOW
TIBC: 284
UIBC: 250

## 2010-12-30 LAB — CK TOTAL AND CKMB (NOT AT ARMC)
CK, MB: 4
Relative Index: 0.7
Total CK: 608 — ABNORMAL HIGH

## 2010-12-30 LAB — CBC
HCT: 28.6 — ABNORMAL LOW
HCT: 31.2 — ABNORMAL LOW
Hemoglobin: 10.3 — ABNORMAL LOW
Hemoglobin: 9.4 — ABNORMAL LOW
MCHC: 32.9
MCHC: 33
MCV: 81.8
MCV: 82.2
Platelets: 213
Platelets: 227
RBC: 3.48 — ABNORMAL LOW
RBC: 3.82 — ABNORMAL LOW
RDW: 16.5 — ABNORMAL HIGH
RDW: 16.9 — ABNORMAL HIGH
WBC: 5.2
WBC: 5.9

## 2010-12-30 LAB — POCT CARDIAC MARKERS
CKMB, poc: 1.3
CKMB, poc: <1 — ABNORMAL LOW
Myoglobin, poc: 108
Myoglobin, poc: 89
Operator id: 267321
Operator id: 267321
Troponin i, poc: <0.05
Troponin i, poc: <0.05

## 2010-12-30 LAB — FERRITIN: Ferritin: 17 (ref 10–291)

## 2010-12-30 LAB — ACETAMINOPHEN LEVEL

## 2010-12-30 LAB — RETICULOCYTES
RBC.: 4.11
Retic Count, Absolute: 41.1
Retic Ct Pct: 1

## 2010-12-30 LAB — FOLATE: Folate: 10.4

## 2010-12-30 LAB — AMYLASE: Amylase: 129

## 2010-12-30 LAB — CK
Total CK: 521 — ABNORMAL HIGH
Total CK: 606 — ABNORMAL HIGH

## 2010-12-30 LAB — TSH: TSH: 1.831

## 2010-12-30 LAB — SALICYLATE LEVEL: Salicylate Lvl: <4

## 2010-12-30 LAB — CALCIUM, IONIZED: Calcium, Ion: 0.8 — ABNORMAL LOW

## 2010-12-30 LAB — ETHANOL: Alcohol, Ethyl (B): <5

## 2010-12-30 LAB — LIPASE, BLOOD: Lipase: 41

## 2010-12-30 LAB — PREGNANCY, URINE: Preg Test, Ur: NEGATIVE

## 2010-12-30 LAB — TROPONIN I: Troponin I: 0.21 — ABNORMAL HIGH

## 2010-12-30 LAB — BILIRUBIN, DIRECT: Bilirubin, Direct: 0.1

## 2011-01-04 LAB — TSH: TSH: 1.31

## 2011-01-04 LAB — URINALYSIS, ROUTINE W REFLEX MICROSCOPIC
Bilirubin Urine: NEGATIVE
Glucose, UA: NEGATIVE
Hgb urine dipstick: NEGATIVE
Ketones, ur: NEGATIVE
Leukocytes, UA: NEGATIVE
Nitrite: NEGATIVE
Protein, ur: 30 — AB
Specific Gravity, Urine: 1.018
Urobilinogen, UA: 1
pH: 7

## 2011-01-04 LAB — URINE MICROSCOPIC-ADD ON

## 2011-01-04 LAB — COMPREHENSIVE METABOLIC PANEL
ALT: 14
AST: 21
Albumin: 3.5
Alkaline Phosphatase: 507 — ABNORMAL HIGH
BUN: 17
CO2: 27
Calcium: 7.3 — ABNORMAL LOW
Chloride: 103
Creatinine, Ser: 1.45 — ABNORMAL HIGH
GFR calc Af Amer: 48 — ABNORMAL LOW
GFR calc non Af Amer: 40 — ABNORMAL LOW
Glucose, Bld: 104 — ABNORMAL HIGH
Potassium: 3.2 — ABNORMAL LOW
Sodium: 138
Total Bilirubin: 1.2
Total Protein: 6.9

## 2011-01-04 LAB — DRUGS OF ABUSE SCREEN W/O ALC, ROUTINE URINE
Amphetamine Screen, Ur: NEGATIVE
Barbiturate Quant, Ur: NEGATIVE
Benzodiazepines.: NEGATIVE
Cocaine Metabolites: NEGATIVE
Creatinine,U: 138.9
Marijuana Metabolite: NEGATIVE
Methadone: NEGATIVE
Opiate Screen, Urine: NEGATIVE
Phencyclidine (PCP): NEGATIVE
Propoxyphene: NEGATIVE

## 2011-01-04 LAB — CBC
HCT: 38.1
Hemoglobin: 12.5
MCHC: 32.8
MCV: 82
Platelets: 267
RBC: 4.65
RDW: 18.4 — ABNORMAL HIGH
WBC: 5.8

## 2011-01-10 DIAGNOSIS — IMO0001 Reserved for inherently not codable concepts without codable children: Secondary | ICD-10-CM | POA: Insufficient documentation

## 2011-01-14 ENCOUNTER — Ambulatory Visit: Payer: Federal, State, Local not specified - PPO | Admitting: Women's Health

## 2011-02-04 ENCOUNTER — Encounter: Payer: Self-pay | Admitting: Women's Health

## 2011-02-04 ENCOUNTER — Ambulatory Visit (INDEPENDENT_AMBULATORY_CARE_PROVIDER_SITE_OTHER): Payer: Federal, State, Local not specified - PPO | Admitting: Women's Health

## 2011-02-04 ENCOUNTER — Other Ambulatory Visit (HOSPITAL_COMMUNITY)
Admission: RE | Admit: 2011-02-04 | Discharge: 2011-02-04 | Disposition: A | Discharge status: Home / Self Care | Payer: Federal, State, Local not specified - PPO | Source: Ambulatory Visit | Attending: Women's Health | Admitting: Women's Health

## 2011-02-04 VITALS — BP 136/86 | Ht 62.0 in | Wt 288.0 lb

## 2011-02-04 DIAGNOSIS — N898 Other specified noninflammatory disorders of vagina: Secondary | ICD-10-CM

## 2011-02-04 DIAGNOSIS — E669 Obesity, unspecified: Secondary | ICD-10-CM

## 2011-02-04 DIAGNOSIS — F319 Bipolar disorder, unspecified: Secondary | ICD-10-CM | POA: Insufficient documentation

## 2011-02-04 DIAGNOSIS — Z01419 Encounter for gynecological examination (general) (routine) without abnormal findings: Secondary | ICD-10-CM | POA: Insufficient documentation

## 2011-02-04 DIAGNOSIS — Z23 Encounter for immunization: Secondary | ICD-10-CM

## 2011-02-04 DIAGNOSIS — Z113 Encounter for screening for infections with a predominantly sexual mode of transmission: Secondary | ICD-10-CM

## 2011-02-04 MED ORDER — FLUCONAZOLE 150 MG PO TABS: 150.0000 mg | ORAL_TABLET | Freq: Once | ORAL | Status: AC

## 2011-02-04 NOTE — Progress Notes (Signed)
[name] 03/25/1965 [id number]    History:    The patient presents for annual exam.  Retired from post office due to an injury.   Past medical history, past surgical history, family history and social history were all reviewed and documented in the EPIC chart.   ROS:  A  ROS was performed and pertinent positives and negatives are included in the history.  Exam:  Filed Vitals:   02/04/11 0938  BP: 136/86    General appearance:  Normal Head/Neck:  Normal, without cervical or supraclavicular adenopathy. Thyroid:  Symmetrical, normal in size, without palpable masses or nodularity. Respiratory  Effort:  Normal  Auscultation:  Clear without wheezing or rhonchi Cardiovascular  Auscultation:  Regular rate, without rubs, murmurs or gallops  Edema/varicosities:  Not grossly evident Abdominal  Soft,nontender, without masses, guarding or rebound.  Liver/spleen:  No organomegaly noted  Hernia:  None appreciated  Skin  Inspection:  Grossly normal  Palpation:  Grossly normal Neurologic/psychiatric  Orientation:  Normal with appropriate conversation.  Mood/affect:  Normal  Genitourinary    Breasts: Examined lying and sitting.     Right: Without masses, retractions, discharge or axillary adenopathy.     Left: Without masses, retractions, discharge or axillary adenopathy.   Inguinal/mons:  Normal without inguinal adenopathy  External genitalia:  Normal  BUS/Urethra/Skene's glands:  Normal  Bladder:  Normal  Vagina:  Normal  Cervix:  Normal  Uterus:  retrverted, normal in size, shape and contour.  Midline and mobile/inadequate exam due to obesity  Adnexa/parametria:     Rt: Without masses or tenderness.   Lt: Without masses or tenderness.  Anus and perineum: Normal  Digital rectal exam: Normal sphincter tone without palpated masses or tenderness  Assessment/Plan:  45 y.o. S WF G0 for annual exam. Monthly 5-6 day cycle heavy flow 3 of the days. Condoms when sexually  active. History of gastric bypass in 2000. History of normal Paps, last  Pap 2007. Has not had a mammogram.  Menorrhagia Morbid obesity STD screen Yeast Hypertension/primary care labs and meds  Plan: Schedule ultrasound after her next cycle to assess ovaries and uterus. Diflucan 150 by mouth x1 dose for yeast, yeast prevention discussed. Pap, GC/Chlamydia, HIV, RPR, hepatitis B and C. Encourage condoms if sexually active or return to office for IUD placement. Declines need at this time. SBEs, strongly encouraged to schedule annual mammogram screening. Reviewed importance of increasing exercise, decreasing calories for weight loss. Continue care with primary care. Flu shot given today.   [place], 10:20 AM 02/04/2011

## 2011-02-05 LAB — HEPATITIS C ANTIBODY: HCV Ab: NEGATIVE

## 2011-02-05 LAB — HEPATITIS B SURFACE ANTIGEN: Hepatitis B Surface Ag: NEGATIVE

## 2011-02-05 LAB — RPR

## 2011-02-05 LAB — HIV ANTIBODY (ROUTINE TESTING W REFLEX): HIV: NONREACTIVE

## 2011-02-18 ENCOUNTER — Ambulatory Visit: Payer: Federal, State, Local not specified - PPO | Admitting: Women's Health

## 2011-02-18 ENCOUNTER — Other Ambulatory Visit: Payer: Federal, State, Local not specified - PPO

## 2011-04-02 DIAGNOSIS — I129 Hypertensive chronic kidney disease with stage 1 through stage 4 chronic kidney disease, or unspecified chronic kidney disease: Secondary | ICD-10-CM | POA: Diagnosis not present

## 2011-04-02 DIAGNOSIS — L659 Nonscarring hair loss, unspecified: Secondary | ICD-10-CM | POA: Diagnosis not present

## 2011-04-02 DIAGNOSIS — N183 Chronic kidney disease, stage 3 unspecified: Secondary | ICD-10-CM | POA: Diagnosis not present

## 2011-04-02 DIAGNOSIS — R635 Abnormal weight gain: Secondary | ICD-10-CM | POA: Diagnosis not present

## 2011-06-20 DIAGNOSIS — N926 Irregular menstruation, unspecified: Secondary | ICD-10-CM | POA: Diagnosis not present

## 2011-06-20 DIAGNOSIS — N959 Unspecified menopausal and perimenopausal disorder: Secondary | ICD-10-CM | POA: Diagnosis not present

## 2011-06-20 DIAGNOSIS — N898 Other specified noninflammatory disorders of vagina: Secondary | ICD-10-CM | POA: Diagnosis not present

## 2011-06-20 DIAGNOSIS — N951 Menopausal and female climacteric states: Secondary | ICD-10-CM | POA: Diagnosis not present

## 2011-06-25 DIAGNOSIS — Z79899 Other long term (current) drug therapy: Secondary | ICD-10-CM | POA: Diagnosis not present

## 2011-06-25 DIAGNOSIS — R0609 Other forms of dyspnea: Secondary | ICD-10-CM | POA: Diagnosis not present

## 2011-06-25 DIAGNOSIS — N182 Chronic kidney disease, stage 2 (mild): Secondary | ICD-10-CM | POA: Diagnosis not present

## 2011-06-25 DIAGNOSIS — N183 Chronic kidney disease, stage 3 unspecified: Secondary | ICD-10-CM | POA: Diagnosis not present

## 2011-06-25 DIAGNOSIS — D649 Anemia, unspecified: Secondary | ICD-10-CM | POA: Diagnosis not present

## 2011-06-25 DIAGNOSIS — I10 Essential (primary) hypertension: Secondary | ICD-10-CM | POA: Diagnosis not present

## 2011-06-25 DIAGNOSIS — R609 Edema, unspecified: Secondary | ICD-10-CM | POA: Diagnosis not present

## 2011-06-25 DIAGNOSIS — R0989 Other specified symptoms and signs involving the circulatory and respiratory systems: Secondary | ICD-10-CM | POA: Diagnosis not present

## 2011-06-25 DIAGNOSIS — R0602 Shortness of breath: Secondary | ICD-10-CM | POA: Diagnosis not present

## 2011-06-25 DIAGNOSIS — I129 Hypertensive chronic kidney disease with stage 1 through stage 4 chronic kidney disease, or unspecified chronic kidney disease: Secondary | ICD-10-CM | POA: Diagnosis not present

## 2011-07-06 ENCOUNTER — Encounter (HOSPITAL_COMMUNITY)
Admission: RE | Admit: 2011-07-06 | Discharge: 2011-07-06 | Disposition: A | Discharge status: Home / Self Care | Payer: Federal, State, Local not specified - PPO | Source: Ambulatory Visit | Attending: Internal Medicine | Admitting: Internal Medicine

## 2011-07-06 ENCOUNTER — Encounter (HOSPITAL_COMMUNITY): Payer: Self-pay

## 2011-07-06 DIAGNOSIS — D509 Iron deficiency anemia, unspecified: Secondary | ICD-10-CM | POA: Insufficient documentation

## 2011-07-06 LAB — CBC
HCT: 34.8 % — ABNORMAL LOW (ref 36.0–46.0)
Hemoglobin: 10.4 g/dL — ABNORMAL LOW (ref 12.0–15.0)
MCH: 20.8 pg — ABNORMAL LOW (ref 26.0–34.0)
MCHC: 29.9 g/dL — ABNORMAL LOW (ref 30.0–36.0)
MCV: 69.7 fL — ABNORMAL LOW (ref 78.0–100.0)
Platelets: 274 10*3/uL (ref 150–400)
RBC: 4.99 MIL/uL (ref 3.87–5.11)
RDW: 23.2 % — ABNORMAL HIGH (ref 11.5–15.5)
WBC: 9.8 10*3/uL (ref 4.0–10.5)

## 2011-07-06 LAB — ABO/RH: ABO/RH(D): B POS

## 2011-07-06 LAB — PREPARE RBC (CROSSMATCH)

## 2011-07-06 MED ORDER — SODIUM CHLORIDE 0.9 % IV SOLN: INTRAVENOUS | Status: AC

## 2011-07-06 MED ORDER — FUROSEMIDE 10 MG/ML IJ SOLN
20.0000 mg | Freq: Once | INTRAMUSCULAR | Status: AC
  Administered 2011-07-06: 20 mg via INTRAVENOUS
  Filled 2011-07-06: qty 2

## 2011-07-06 NOTE — Discharge Instructions (Signed)
See blood transfusion sheet for detailed information about your blood transfusion today. Call your MD with any problems or questions.

## 2011-07-06 NOTE — Progress Notes (Signed)
Patient got two units of blood as ordered today. Tolerated well. States she feels "much better" than when she arrived this am. CBC two hours post transfusion drawn and will fax to Dr. Tye Savoy' office when results are back. Patient verbalized understanding of d/c instructions and left at 1730.

## 2011-07-07 LAB — TYPE AND SCREEN
ABO/RH(D): B POS
Antibody Screen: NEGATIVE
Unit division: 0
Unit division: 0

## 2011-07-20 ENCOUNTER — Emergency Department (HOSPITAL_COMMUNITY): Payer: Medicare Other

## 2011-07-20 ENCOUNTER — Emergency Department (HOSPITAL_COMMUNITY)
Admission: EM | Admit: 2011-07-20 | Discharge: 2011-07-20 | Disposition: A | Discharge status: Home / Self Care | Payer: Medicare Other | Attending: Emergency Medicine | Admitting: Emergency Medicine

## 2011-07-20 ENCOUNTER — Encounter (HOSPITAL_COMMUNITY): Payer: Self-pay | Admitting: *Deleted

## 2011-07-20 DIAGNOSIS — Z043 Encounter for examination and observation following other accident: Secondary | ICD-10-CM | POA: Diagnosis not present

## 2011-07-20 DIAGNOSIS — R4182 Altered mental status, unspecified: Secondary | ICD-10-CM | POA: Insufficient documentation

## 2011-07-20 DIAGNOSIS — I10 Essential (primary) hypertension: Secondary | ICD-10-CM | POA: Insufficient documentation

## 2011-07-20 DIAGNOSIS — Y9289 Other specified places as the place of occurrence of the external cause: Secondary | ICD-10-CM | POA: Insufficient documentation

## 2011-07-20 DIAGNOSIS — F341 Dysthymic disorder: Secondary | ICD-10-CM | POA: Diagnosis not present

## 2011-07-20 DIAGNOSIS — R42 Dizziness and giddiness: Secondary | ICD-10-CM | POA: Diagnosis not present

## 2011-07-20 DIAGNOSIS — R55 Syncope and collapse: Secondary | ICD-10-CM | POA: Insufficient documentation

## 2011-07-20 DIAGNOSIS — W19XXXA Unspecified fall, initial encounter: Secondary | ICD-10-CM | POA: Insufficient documentation

## 2011-07-20 DIAGNOSIS — R630 Anorexia: Secondary | ICD-10-CM | POA: Diagnosis not present

## 2011-07-20 DIAGNOSIS — R109 Unspecified abdominal pain: Secondary | ICD-10-CM | POA: Diagnosis not present

## 2011-07-20 DIAGNOSIS — Z79899 Other long term (current) drug therapy: Secondary | ICD-10-CM | POA: Diagnosis not present

## 2011-07-20 DIAGNOSIS — R799 Abnormal finding of blood chemistry, unspecified: Secondary | ICD-10-CM | POA: Diagnosis not present

## 2011-07-20 HISTORY — DX: Anemia, unspecified: D64.9

## 2011-07-20 LAB — URINE MICROSCOPIC-ADD ON

## 2011-07-20 LAB — D-DIMER, QUANTITATIVE: D-Dimer, Quant: 0.84 ug/mL-FEU — ABNORMAL HIGH (ref 0.00–0.48)

## 2011-07-20 LAB — CBC
HCT: 38.3 % (ref 36.0–46.0)
Hemoglobin: 11.5 g/dL — ABNORMAL LOW (ref 12.0–15.0)
MCH: 21.4 pg — ABNORMAL LOW (ref 26.0–34.0)
MCHC: 30 g/dL (ref 30.0–36.0)
MCV: 71.3 fL — ABNORMAL LOW (ref 78.0–100.0)
Platelets: 289 10*3/uL (ref 150–400)
RBC: 5.37 MIL/uL — ABNORMAL HIGH (ref 3.87–5.11)
RDW: 25 % — ABNORMAL HIGH (ref 11.5–15.5)
WBC: 15 10*3/uL — ABNORMAL HIGH (ref 4.0–10.5)

## 2011-07-20 LAB — COMPREHENSIVE METABOLIC PANEL
ALT: 13 U/L (ref 0–35)
AST: 22 U/L (ref 0–37)
Albumin: 4.3 g/dL (ref 3.5–5.2)
Alkaline Phosphatase: 166 U/L — ABNORMAL HIGH (ref 39–117)
BUN: 26 mg/dL — ABNORMAL HIGH (ref 6–23)
CO2: 18 mEq/L — ABNORMAL LOW (ref 19–32)
Calcium: 9.4 mg/dL (ref 8.4–10.5)
Chloride: 107 mEq/L (ref 96–112)
Creatinine, Ser: 2.18 mg/dL — ABNORMAL HIGH (ref 0.50–1.10)
GFR calc Af Amer: 30 mL/min — ABNORMAL LOW (ref >90)
GFR calc non Af Amer: 26 mL/min — ABNORMAL LOW (ref >90)
Glucose, Bld: 93 mg/dL (ref 70–99)
Potassium: 3.4 mEq/L — ABNORMAL LOW (ref 3.5–5.1)
Sodium: 142 mEq/L (ref 135–145)
Total Bilirubin: 0.4 mg/dL (ref 0.3–1.2)
Total Protein: 7.9 g/dL (ref 6.0–8.3)

## 2011-07-20 LAB — PREGNANCY, URINE: Preg Test, Ur: NEGATIVE

## 2011-07-20 LAB — DIFFERENTIAL
Basophils Absolute: 0 10*3/uL (ref 0.0–0.1)
Basophils Relative: 0 % (ref 0–1)
Eosinophils Absolute: 0 10*3/uL (ref 0.0–0.7)
Eosinophils Relative: 0 % (ref 0–5)
Lymphocytes Relative: 12 % (ref 12–46)
Lymphs Abs: 1.8 10*3/uL (ref 0.7–4.0)
Monocytes Absolute: 0.8 10*3/uL (ref 0.1–1.0)
Monocytes Relative: 5 % (ref 3–12)
Neutro Abs: 12.4 10*3/uL — ABNORMAL HIGH (ref 1.7–7.7)
Neutrophils Relative %: 83 % — ABNORMAL HIGH (ref 43–77)

## 2011-07-20 LAB — URINALYSIS, ROUTINE W REFLEX MICROSCOPIC
Glucose, UA: NEGATIVE mg/dL
Ketones, ur: NEGATIVE mg/dL
Leukocytes, UA: NEGATIVE
Nitrite: NEGATIVE
Protein, ur: >300 mg/dL — AB
Specific Gravity, Urine: >1.03 — ABNORMAL HIGH (ref 1.005–1.030)
Urobilinogen, UA: 0.2 mg/dL (ref 0.0–1.0)
pH: 5.5 (ref 5.0–8.0)

## 2011-07-20 LAB — SAMPLE TO BLOOD BANK

## 2011-07-20 LAB — PRO B NATRIURETIC PEPTIDE: Pro B Natriuretic peptide (BNP): 321.5 pg/mL — ABNORMAL HIGH (ref 0–125)

## 2011-07-20 LAB — TROPONIN I: Troponin I: <0.3 ng/mL (ref <0.30)

## 2011-07-20 MED ORDER — SODIUM CHLORIDE 0.9 % IV SOLN: 1000.0000 mL | INTRAVENOUS | Status: DC

## 2011-07-20 MED ORDER — TECHNETIUM TO 99M ALBUMIN AGGREGATED
3.0000 | Freq: Once | INTRAVENOUS | Status: AC | PRN
  Administered 2011-07-20: 3 via INTRAVENOUS

## 2011-07-20 NOTE — ED Notes (Signed)
Pt left dept prior to signing discharge papers and has not returned.

## 2011-07-20 NOTE — ED Notes (Signed)
Pt ambulated to restroom on arrival with steady gait. Came back from restroom stating, "Ok, I'm good now, I can tell you what happened". Pt then stated she has not been eating and began craving "Haw[place]ian Punch and a Po Boy (chicke strips)." Pt states after eating she became real hot and began not feeling well and fell.

## 2011-07-20 NOTE — ED Provider Notes (Signed)
History    This chart was scribed for Nicole Apo, MD, MD by Rhae Lerner. The patient was seen in room APA05 and the patient's care was started at 1:10PM.   CSN: [id number]  Arrival date & time 07/20/11  1200   First MD Initiated Contact with Patient 07/20/11 1304      Chief Complaint  Patient presents with  . Altered Mental Status    (Consider location/radiation/quality/duration/timing/severity/associated sxs/prior treatment) The history is provided by the patient.   [name] is a 46 y.o. female who presents to the Emergency Department BIB EMS due to falling in Dr. Freddie Apley parking lot. Pt was on the way to the pharmacy and ended up in the parking lot because she was planning to use the restroom in Dr. Freddie Apley office. She reports that she felt dizzy when the fall occurred. She states she was grabbing the door when she feel. She reports that she has had dumping syndrome. She denies head injury during fall. She reports that she has moderate abdominal pain. She states that she is tender on her right side. She has had gastric bypass surgery and blood transfusion a few weeks ago. Pt reports that she takes Topamax. Pt reports that she has had decreased appetite.  PCP is Dr. Baird Cancer   Past Medical History  Diagnosis Date  . IUD     HISTORY OF IUD --REMOVED IN 2006  . Hypertension   . Bipolar disorder   . Anxiety   . Depression   . Anemia     Past Surgical History  Procedure Date  . Rotator cuff repair   . Gastric bypass 2000    Family History  Problem Relation Age of Onset  . Hypertension Mother   . Hypertension Father   . Diabetes Sister   . Hypertension Sister   . Hypertension Brother   . Hypertension Maternal Aunt   . Hypertension Maternal Uncle   . Hypertension Maternal Grandmother   . Hypertension Maternal Grandfather   . Hypertension Paternal Grandmother   . Hypertension Paternal Grandfather   . Diabetes Cousin     History  Substance Use  Topics  . Smoking status: Former Smoker    Quit date: 03/21/1988  . Smokeless tobacco: Never Used  . Alcohol Use: Yes     social    OB History    Grav Para Term Preterm Abortions TAB SAB Ect Mult Living   1    1  1    0      Review of Systems  All other systems reviewed and are negative.   10 Systems reviewed and all are negative for acute change except as noted in the HPI.   Allergies  Sulfa antibiotics  Home Medications   Current Outpatient Rx  Name Route Sig Dispense Refill  . AMLODIPINE BESYLATE PO Oral Take by mouth.      . LOTENSIN PO Oral Take by mouth.      . CARBAMAZEPINE ER (ANTIPSYCH) 200 MG PO CP12 Oral Take 200 mg by mouth 2 (two) times daily.      . CELEBREX PO Oral Take by mouth.      Marland Kitchen PARAFON FORTE DSC PO Oral Take by mouth.      . CLONIDINE HCL PO Oral Take by mouth.      Marland Kitchen DIAZEPAM 5 MG PO TABS Oral Take 5 mg by mouth at bedtime as needed.    . FUROSEMIDE 20 MG PO TABS Oral Take 20 mg by mouth  2 (two) times daily.      Marland Kitchen LAMOTRIGINE 100 MG PO TABS Oral Take 300 mg by mouth daily.      Marland Kitchen VIOXX PO Oral Take by mouth.        BP 99/55  Pulse 116  Temp(Src) 98.4 F (36.9 C) (Oral)  Resp 16  Ht 5\' 2"  (1.575 m)  Wt 280 lb (127.007 kg)  BMI 51.21 kg/m2  SpO2 97%  LMP 07/20/2011  Physical Exam  Nursing note and vitals reviewed. Constitutional: She is oriented to person, place, and time. She appears well-developed and well-nourished. No distress.       Morbidly obese  HENT:  Head: Normocephalic and atraumatic.  Mouth/Throat: Oropharynx is clear and moist.  Eyes: Conjunctivae are normal. Pupils are equal, round, and reactive to light.  Neck: Normal range of motion. No thyromegaly present.  Cardiovascular: Normal rate, regular rhythm and normal heart sounds.   Pulmonary/Chest: Effort normal and breath sounds normal. No respiratory distress. She exhibits tenderness (costal margin right side).  Abdominal: Soft. There is no tenderness.  Neurological:  She is alert and oriented to person, place, and time.  Skin: Skin is warm and dry.  Psychiatric: She has a normal mood and affect. Her behavior is normal.    ED Course  Procedures (including critical care time) DIAGNOSTIC STUDIES: Oxygen Saturation is 97% on room air, normal by my interpretation.    COORDINATION OF CARE: 1:20PM EDP discusses pt ED treatment course with pt.  1:30PM EDP orders medication: 0.9% NaCl infusion. Dg Chest 2 View  07/20/2011  *RADIOLOGY REPORT*  Clinical Data: Fall  CHEST - 2 VIEW  Comparison: 10/18/2009  Findings: Normal heart size.  Clear lungs.  No pneumothorax. Intact thoracic spine.  IMPRESSION: No active cardiopulmonary disease.  Original Report Authenticated By: Jamas Lav, M.D.   3:06 PM  Date: 07/20/2011  Rate:79  Rhythm: normal sinus rhythm  QRS Axis: left  Intervals: normal QRS:  Q waves in inferior leads suggest old inferior myocardial infarction. Poor R wave progression in lateral precordial leads suggests old lateral myocardial infarction.   ST/T Wave abnormalities: normal  Conduction Disutrbances:none  Narrative Interpretation: Abnormal EKG  Old EKG Reviewed: Prior EKG on 08/26/2010 was of poor quality, so comparison is not possible.  3:15 PM Results for orders placed during the hospital encounter of 07/20/11  CBC      Component Value Range   WBC 15.0 (*) 4.0 - 10.5 (K/uL)   RBC 5.37 (*) 3.87 - 5.11 (MIL/uL)   Hemoglobin 11.5 (*) 12.0 - 15.0 (g/dL)   HCT 38.3  36.0 - 46.0 (%)   MCV 71.3 (*) 78.0 - 100.0 (fL)   MCH 21.4 (*) 26.0 - 34.0 (pg)   MCHC 30.0  30.0 - 36.0 (g/dL)   RDW 25.0 (*) 11.5 - 15.5 (%)   Platelets 289  150 - 400 (K/uL)  DIFFERENTIAL      Component Value Range   Neutrophils Relative 83 (*) 43 - 77 (%)   Neutro Abs 12.4 (*) 1.7 - 7.7 (K/uL)   Lymphocytes Relative 12  12 - 46 (%)   Lymphs Abs 1.8  0.7 - 4.0 (K/uL)   Monocytes Relative 5  3 - 12 (%)   Monocytes Absolute 0.8  0.1 - 1.0 (K/uL)   Eosinophils Relative  0  0 - 5 (%)   Eosinophils Absolute 0.0  0.0 - 0.7 (K/uL)   Basophils Relative 0  0 - 1 (%)   Basophils Absolute 0.0  0.0 - 0.1 (K/uL)  COMPREHENSIVE METABOLIC PANEL      Component Value Range   Sodium 142  135 - 145 (mEq/L)   Potassium 3.4 (*) 3.5 - 5.1 (mEq/L)   Chloride 107  96 - 112 (mEq/L)   CO2 18 (*) 19 - 32 (mEq/L)   Glucose, Bld 93  70 - 99 (mg/dL)   BUN 26 (*) 6 - 23 (mg/dL)   Creatinine, Ser 2.18 (*) 0.50 - 1.10 (mg/dL)   Calcium 9.4  8.4 - 10.5 (mg/dL)   Total Protein 7.9  6.0 - 8.3 (g/dL)   Albumin 4.3  3.5 - 5.2 (g/dL)   AST 22  0 - 37 (U/L)   ALT 13  0 - 35 (U/L)   Alkaline Phosphatase 166 (*) 39 - 117 (U/L)   Total Bilirubin 0.4  0.3 - 1.2 (mg/dL)   GFR calc non Af Amer 26 (*) >90 (mL/min)   GFR calc Af Amer 30 (*) >90 (mL/min)  D-DIMER, QUANTITATIVE      Component Value Range   D-Dimer, Quant 0.84 (*) 0.00 - 0.48 (ug/mL-FEU)  PRO B NATRIURETIC PEPTIDE      Component Value Range   Pro B Natriuretic peptide (BNP) 321.5 (*) 0 - 125 (pg/mL)  SAMPLE TO BLOOD BANK      Component Value Range   Blood Bank Specimen SAMPLE AVAILABLE FOR TESTING     Sample Expiration 07/23/2011    TROPONIN I      Component Value Range   Troponin I <0.30  <0.30 (ng/mL)   Dg Chest 2 View  07/20/2011  *RADIOLOGY REPORT*  Clinical Data: Fall  CHEST - 2 VIEW  Comparison: 10/18/2009  Findings: Normal heart size.  Clear lungs.  No pneumothorax. Intact thoracic spine.  IMPRESSION: No active cardiopulmonary disease.  Original Report Authenticated By: Jamas Lav, M.D.    3:16 PM Lab tests show CBC with white count 15,000 with 83% neutrophils. Hemoglobin is 11.5 and hematocrit 38.3, both normal. Platelet count was 289,000. Electrolytes were normal. BUN was 26 and creatinine 2.8, mild renal insufficiency. D-dimer was mildly elevated at 0.84. Pro BNP was mildly elevated at 321. Troponin I was negative. Order V/Q scan of chest to check for pulmonary embolism.   5:02PM - Dr. Roderic Palau discussed  scans with pt and will discharge. Pt agrees with plan.  No diagnosis found.    MDM   The chart was scribed for me under my direct supervision.  I personally performed the history, physical, and medical decision making and all procedures in the evaluation of this patient.Maudry Diego, MD 07/20/11 [id number]

## 2011-07-20 NOTE — Discharge Instructions (Signed)
Follow up with your md in 2-7 days

## 2011-07-20 NOTE — ED Notes (Signed)
EMS states patient was going to the pharmacy (from [employer]) and ended up in Dr. Freddie Apley parking lot (not her MD and walked a block from pharmacy to Dr.s ofc) and staff witnessed her fall in the parking area. She went into the office to use restroom and staff called EMS. CBG of 294 per EMS, O2 sat RA of 92%. Hx of recent blood transfusion.

## 2011-07-22 LAB — URINE CULTURE
Colony Count: NO GROWTH
Culture  Setup Time: 201305020233
Culture: NO GROWTH

## 2011-08-30 DIAGNOSIS — N926 Irregular menstruation, unspecified: Secondary | ICD-10-CM | POA: Diagnosis not present

## 2011-09-26 DIAGNOSIS — N920 Excessive and frequent menstruation with regular cycle: Secondary | ICD-10-CM | POA: Diagnosis not present

## 2011-10-01 DIAGNOSIS — Z79899 Other long term (current) drug therapy: Secondary | ICD-10-CM | POA: Diagnosis not present

## 2011-10-01 DIAGNOSIS — N182 Chronic kidney disease, stage 2 (mild): Secondary | ICD-10-CM | POA: Diagnosis not present

## 2011-10-01 DIAGNOSIS — I129 Hypertensive chronic kidney disease with stage 1 through stage 4 chronic kidney disease, or unspecified chronic kidney disease: Secondary | ICD-10-CM | POA: Diagnosis not present

## 2011-10-01 DIAGNOSIS — D509 Iron deficiency anemia, unspecified: Secondary | ICD-10-CM | POA: Diagnosis not present

## 2011-10-01 DIAGNOSIS — D649 Anemia, unspecified: Secondary | ICD-10-CM | POA: Diagnosis not present

## 2011-10-01 DIAGNOSIS — N183 Chronic kidney disease, stage 3 unspecified: Secondary | ICD-10-CM | POA: Diagnosis not present

## 2011-10-01 DIAGNOSIS — D518 Other vitamin B12 deficiency anemias: Secondary | ICD-10-CM | POA: Diagnosis not present

## 2011-11-02 ENCOUNTER — Encounter (HOSPITAL_COMMUNITY): Payer: Self-pay | Admitting: Pharmacist

## 2011-11-07 ENCOUNTER — Encounter (HOSPITAL_COMMUNITY)
Admission: RE | Admit: 2011-11-07 | Discharge: 2011-11-07 | Disposition: A | Discharge status: Home / Self Care | Payer: Medicare Other | Source: Ambulatory Visit | Attending: Obstetrics and Gynecology | Admitting: Obstetrics and Gynecology

## 2011-11-07 ENCOUNTER — Encounter (HOSPITAL_COMMUNITY): Payer: Self-pay

## 2011-11-07 DIAGNOSIS — R9389 Abnormal findings on diagnostic imaging of other specified body structures: Secondary | ICD-10-CM | POA: Diagnosis not present

## 2011-11-07 DIAGNOSIS — N840 Polyp of corpus uteri: Secondary | ICD-10-CM | POA: Diagnosis not present

## 2011-11-07 DIAGNOSIS — N926 Irregular menstruation, unspecified: Secondary | ICD-10-CM | POA: Diagnosis not present

## 2011-11-07 DIAGNOSIS — E669 Obesity, unspecified: Secondary | ICD-10-CM | POA: Diagnosis not present

## 2011-11-07 HISTORY — DX: Unspecified convulsions: R56.9

## 2011-11-07 HISTORY — DX: Personal history of other medical treatment: Z92.89

## 2011-11-07 LAB — CBC
HCT: 41.8 % (ref 36.0–46.0)
Hemoglobin: 12.8 g/dL (ref 12.0–15.0)
MCH: 25.5 pg — ABNORMAL LOW (ref 26.0–34.0)
MCHC: 30.6 g/dL (ref 30.0–36.0)
MCV: 83.3 fL (ref 78.0–100.0)
Platelets: 245 10*3/uL (ref 150–400)
RBC: 5.02 MIL/uL (ref 3.87–5.11)
RDW: 20.1 % — ABNORMAL HIGH (ref 11.5–15.5)
WBC: 7.2 10*3/uL (ref 4.0–10.5)

## 2011-11-07 LAB — BASIC METABOLIC PANEL
BUN: 21 mg/dL (ref 6–23)
CO2: 18 mEq/L — ABNORMAL LOW (ref 19–32)
Calcium: 8.8 mg/dL (ref 8.4–10.5)
Chloride: 110 mEq/L (ref 96–112)
Creatinine, Ser: 1.75 mg/dL — ABNORMAL HIGH (ref 0.50–1.10)
GFR calc Af Amer: 39 mL/min — ABNORMAL LOW (ref >90)
GFR calc non Af Amer: 34 mL/min — ABNORMAL LOW (ref >90)
Glucose, Bld: 93 mg/dL (ref 70–99)
Potassium: 4 mEq/L (ref 3.5–5.1)
Sodium: 142 mEq/L (ref 135–145)

## 2011-11-07 NOTE — Patient Instructions (Addendum)
Your procedure is scheduled on : [facility] 11/14/11  Enter through the Main Entrance at : 6AM Pick up desk phone and dial [phone] and inform us of your arrival.  Please call [phone] if you have any problems the morning of surgery.  Remember: Do not eat after midnight:SUNDAY Do not drink after:MIDNIGHT SUNDAY  Take these meds the morning of surgery with a sip of water: NONE  DO NOT wear jewelry, eye make-up, lipstick,body lotion, or dark fingernail polish. Do not shave for 48 hours prior to surgery.  If you are to be admitted after surgery, leave suitcase in car until your room has been assigned. Patients discharged on the day of surgery will not be allowed to drive home.   Remember to use your Hibiclens as instructed.

## 2011-11-08 DIAGNOSIS — Z01818 Encounter for other preprocedural examination: Secondary | ICD-10-CM | POA: Diagnosis not present

## 2011-11-08 DIAGNOSIS — N926 Irregular menstruation, unspecified: Secondary | ICD-10-CM | POA: Diagnosis not present

## 2011-11-11 NOTE — H&P (Signed)
[name], [name] NO.:  [id number]  MEDICAL RECORD NO.:  [id number]  LOCATION:  PERIO                         FACILITY:  [facility]  PHYSICIAN:  Marylynn Pearson, MD    DATE OF BIRTH:  20-Dec-1965  DATE OF ADMISSION:  10/04/2011 DATE OF DISCHARGE:                             HISTORY & PHYSICAL   HISTORY OF PRESENT ILLNESS:  A 46 year old, G1, presents today for surgical management of irregular menses and suspected endometrial polyps.  PAST MEDICAL HISTORY:  Arthritis and hypertension.  PAST SURGICAL HISTORY:  Gastric bypass and rotator cuff surgery.  ALLERGIES:  SULFA.  SOCIAL HISTORY:  Positive for tobacco and alcohol use.  MEDICATIONS:  Diazepam, Topamax, amlodipine,  clonidine, cyclobenzaprine, Lasix, and Lamictal.  FAMILY HISTORY:  Noncontributory.  PHYSICAL EXAMINATION:  VITAL SIGNS:  Afebrile.  Vital signs are stable. GENERAL:  She is in no acute distress. HEART:  Regular rate and rhythm. LUNGS:  Clear bilaterally. ABDOMEN:  Soft, obese, and nontender. PELVIC:  Negative for masses.  Ultrasound showed 2 cm right ovarian simple cyst and multiple polypoid masses within the endometrial lining after saline infusion.  The largest measured at 16 mm, 10 mm, and 6 mm.  ASSESSMENT:  Irregular menses and thickened irregular endometrial stripe consistent with endometrial polyps.  PLAN:  Hysteroscopy D and C, removal of endometrial polyps.  Risks, benefits,  and alternatives were discussed, and informed consent was obtained.    Marylynn Pearson, MD    GA/MEDQ  D:  11/10/2011  T:  11/11/2011  Job:  [id number]

## 2011-11-13 ENCOUNTER — Encounter (HOSPITAL_COMMUNITY): Payer: Self-pay | Admitting: Anesthesiology

## 2011-11-13 MED ORDER — DEXTROSE 5 % IV SOLN
2.0000 g | INTRAVENOUS | Status: AC
  Administered 2011-11-14: 2 g via INTRAVENOUS
  Filled 2011-11-13: qty 2

## 2011-11-13 NOTE — Anesthesia Preprocedure Evaluation (Addendum)
Anesthesia Evaluation  Patient identified by MRN, date of birth, ID band Patient awake    Reviewed: Allergy & Precautions, H&P , NPO status , Patient's Chart, lab work & pertinent test results  Airway Mallampati: III TM Distance: >3 FB Neck ROM: Full    Dental No notable dental hx. (+) Teeth Intact   Pulmonary sleep apnea and Continuous Positive Airway Pressure Ventilation , Current Smoker,  breath sounds clear to auscultation  Pulmonary exam normal       Cardiovascular hypertension, Pt. on medications Rhythm:Regular Rate:Normal     Neuro/Psych Seizures -, Well Controlled,  PSYCHIATRIC DISORDERS Anxiety Depression Bipolar Disorder    GI/Hepatic Neg liver ROS, GERD-  Medicated and Controlled,S/P Gastric Bypass   Endo/Other  Morbid obesity  Renal/GU negative Renal ROS  negative genitourinary   Musculoskeletal negative musculoskeletal ROS (+)   Abdominal (+) + obese,   Peds  Hematology Hx/o Blood Transfusion   Anesthesia Other Findings   Reproductive/Obstetrics negative OB ROS Irregular Menses Possible endometrial polyp                          Anesthesia Physical Anesthesia Plan  ASA: III  Anesthesia Plan: General   Post-op Pain Management:    Induction: Intravenous  Airway Management Planned: LMA  Additional Equipment:   Intra-op Plan:   Post-operative Plan: Extubation in OR  Informed Consent: I have reviewed the patients History and Physical, chart, labs and discussed the procedure including the risks, benefits and alternatives for the proposed anesthesia with the patient or authorized representative who has indicated his/her understanding and acceptance.   Dental advisory given  Plan Discussed with: CRNA, Anesthesiologist and Surgeon  Anesthesia Plan Comments:         Anesthesia Quick Evaluation

## 2011-11-14 ENCOUNTER — Encounter (HOSPITAL_COMMUNITY): Payer: Self-pay | Admitting: Anesthesiology

## 2011-11-14 ENCOUNTER — Encounter (HOSPITAL_COMMUNITY)
Admission: RE | Disposition: A | Discharge status: Home / Self Care | Payer: Self-pay | Source: Ambulatory Visit | Attending: Obstetrics and Gynecology

## 2011-11-14 ENCOUNTER — Ambulatory Visit (HOSPITAL_COMMUNITY): Payer: Medicare Other | Admitting: Anesthesiology

## 2011-11-14 ENCOUNTER — Ambulatory Visit (HOSPITAL_COMMUNITY)
Admission: RE | Admit: 2011-11-14 | Discharge: 2011-11-14 | Disposition: A | Discharge status: Home / Self Care | Payer: Medicare Other | Source: Ambulatory Visit | Attending: Obstetrics and Gynecology | Admitting: Obstetrics and Gynecology

## 2011-11-14 DIAGNOSIS — N926 Irregular menstruation, unspecified: Secondary | ICD-10-CM | POA: Diagnosis not present

## 2011-11-14 DIAGNOSIS — R9389 Abnormal findings on diagnostic imaging of other specified body structures: Secondary | ICD-10-CM | POA: Diagnosis not present

## 2011-11-14 DIAGNOSIS — E669 Obesity, unspecified: Secondary | ICD-10-CM | POA: Insufficient documentation

## 2011-11-14 DIAGNOSIS — N938 Other specified abnormal uterine and vaginal bleeding: Secondary | ICD-10-CM | POA: Diagnosis not present

## 2011-11-14 DIAGNOSIS — N840 Polyp of corpus uteri: Secondary | ICD-10-CM | POA: Diagnosis not present

## 2011-11-14 DIAGNOSIS — N949 Unspecified condition associated with female genital organs and menstrual cycle: Secondary | ICD-10-CM | POA: Diagnosis not present

## 2011-11-14 HISTORY — PX: HYSTEROSCOPY W/D&C: SHX1775

## 2011-11-14 LAB — HCG, SERUM, QUALITATIVE: Preg, Serum: NEGATIVE

## 2011-11-14 SURGERY — DILATATION AND CURETTAGE /HYSTEROSCOPY
Anesthesia: General | Site: Uterus | Wound class: Clean Contaminated

## 2011-11-14 MED ORDER — DEXAMETHASONE SODIUM PHOSPHATE 4 MG/ML IJ SOLN
INTRAMUSCULAR | Status: DC | PRN
  Administered 2011-11-14: 10 mg via INTRAVENOUS

## 2011-11-14 MED ORDER — PROPOFOL 10 MG/ML IV EMUL
INTRAVENOUS | Status: AC
  Filled 2011-11-14: qty 20

## 2011-11-14 MED ORDER — ONDANSETRON HCL 4 MG/2ML IJ SOLN
INTRAMUSCULAR | Status: DC | PRN
  Administered 2011-11-14: 4 mg via INTRAVENOUS

## 2011-11-14 MED ORDER — LIDOCAINE HCL 1 % IJ SOLN
INTRAMUSCULAR | Status: DC | PRN
  Administered 2011-11-14: 20 mL

## 2011-11-14 MED ORDER — MEPERIDINE HCL 25 MG/ML IJ SOLN: 6.2500 mg | INTRAMUSCULAR | Status: DC | PRN

## 2011-11-14 MED ORDER — FENTANYL CITRATE 0.05 MG/ML IJ SOLN
INTRAMUSCULAR | Status: AC
  Administered 2011-11-14: 50 ug via INTRAVENOUS
  Filled 2011-11-14: qty 2

## 2011-11-14 MED ORDER — GLYCOPYRROLATE 0.2 MG/ML IJ SOLN
INTRAMUSCULAR | Status: DC | PRN
  Administered 2011-11-14: 0.1 mg via INTRAVENOUS

## 2011-11-14 MED ORDER — LACTATED RINGERS IV SOLN
INTRAVENOUS | Status: DC
  Administered 2011-11-14 (×2): via INTRAVENOUS

## 2011-11-14 MED ORDER — CLONIDINE HCL 0.1 MG PO TABS
0.1000 mg | ORAL_TABLET | Freq: Every day | ORAL | Status: AC
  Administered 2011-11-14: 0.1 mg via ORAL
  Filled 2011-11-14: qty 1

## 2011-11-14 MED ORDER — MIDAZOLAM HCL 2 MG/2ML IJ SOLN
INTRAMUSCULAR | Status: AC
  Filled 2011-11-14: qty 2

## 2011-11-14 MED ORDER — LIDOCAINE HCL (CARDIAC) 20 MG/ML IV SOLN
INTRAVENOUS | Status: AC
  Filled 2011-11-14: qty 5

## 2011-11-14 MED ORDER — IBUPROFEN 200 MG PO TABS: 600.0000 mg | ORAL_TABLET | Freq: Four times a day (QID) | ORAL | Status: AC | PRN

## 2011-11-14 MED ORDER — SODIUM CHLORIDE 0.9 % IR SOLN
Status: DC | PRN
  Administered 2011-11-14: 3000 mL

## 2011-11-14 MED ORDER — FENTANYL CITRATE 0.05 MG/ML IJ SOLN: 25.0000 ug | INTRAMUSCULAR | Status: DC | PRN

## 2011-11-14 MED ORDER — DEXAMETHASONE SODIUM PHOSPHATE 10 MG/ML IJ SOLN
INTRAMUSCULAR | Status: AC
  Filled 2011-11-14: qty 1

## 2011-11-14 MED ORDER — FENTANYL CITRATE 0.05 MG/ML IJ SOLN
INTRAMUSCULAR | Status: DC | PRN
  Administered 2011-11-14: 100 ug via INTRAVENOUS

## 2011-11-14 MED ORDER — PROPOFOL 10 MG/ML IV EMUL
INTRAVENOUS | Status: DC | PRN
  Administered 2011-11-14: 300 mg via INTRAVENOUS

## 2011-11-14 MED ORDER — GLYCOPYRROLATE 0.2 MG/ML IJ SOLN
INTRAMUSCULAR | Status: AC
  Filled 2011-11-14: qty 1

## 2011-11-14 MED ORDER — LIDOCAINE HCL (CARDIAC) 20 MG/ML IV SOLN
INTRAVENOUS | Status: DC | PRN
  Administered 2011-11-14: 50 mg via INTRAVENOUS

## 2011-11-14 MED ORDER — ONDANSETRON HCL 4 MG/2ML IJ SOLN
INTRAMUSCULAR | Status: AC
  Filled 2011-11-14: qty 2

## 2011-11-14 MED ORDER — FENTANYL CITRATE 0.05 MG/ML IJ SOLN
INTRAMUSCULAR | Status: AC
  Filled 2011-11-14: qty 2

## 2011-11-14 SURGICAL SUPPLY — 15 items
CANISTER SUCTION 2500CC (MISCELLANEOUS) ×3 IMPLANT
CATH ROBINSON RED A/P 16FR (CATHETERS) ×3 IMPLANT
CLOTH BEACON ORANGE TIMEOUT ST (SAFETY) ×3 IMPLANT
CONTAINER PREFILL 10% NBF 60ML (FORM) ×6 IMPLANT
ELECT REM PT RETURN 9FT ADLT (ELECTROSURGICAL)
ELECTRODE REM PT RTRN 9FT ADLT (ELECTROSURGICAL) IMPLANT
ELECTRODE RT ANGLE VERSAPOINT (CUTTING LOOP) ×3 IMPLANT
GLOVE BIO SURGEON STRL SZ 6.5 (GLOVE) ×6 IMPLANT
GOWN PREVENTION PLUS LG XLONG (DISPOSABLE) ×3 IMPLANT
GOWN STRL REIN XL XLG (GOWN DISPOSABLE) ×3 IMPLANT
LOOP ANGLED CUTTING 22FR (CUTTING LOOP) IMPLANT
NEEDLE SPNL 20GX3.5 QUINCKE YW (NEEDLE) IMPLANT
PACK HYSTEROSCOPY LF (CUSTOM PROCEDURE TRAY) ×3 IMPLANT
TOWEL OR 17X24 6PK STRL BLUE (TOWEL DISPOSABLE) ×6 IMPLANT
WATER STERILE IRR 1000ML POUR (IV SOLUTION) ×3 IMPLANT

## 2011-11-14 NOTE — Transfer of Care (Signed)
Immediate Anesthesia Transfer of Care Note  Patient: [name]  Procedure(s) Performed: Procedure(s) (LRB): DILATATION AND CURETTAGE /HYSTEROSCOPY ()  Patient Location: PACU  Anesthesia Type: General  Level of Consciousness: awake, alert  and oriented  Airway & Oxygen Therapy: Patient Spontanous Breathing and Patient connected to nasal cannula oxygen  Post-op Assessment: Report given to PACU RN and Post -op Vital signs reviewed and stable  Post vital signs: Reviewed and stable  Complications: No apparent anesthesia complications

## 2011-11-14 NOTE — Anesthesia Postprocedure Evaluation (Signed)
Anesthesia Post Note  Patient: [name]  Procedure(s) Performed: Procedure(s) (LRB): DILATATION AND CURETTAGE /HYSTEROSCOPY ()  Anesthesia type: GA  Patient location: PACU  Post pain: Pain level controlled  Post assessment: Post-op Vital signs reviewed  Last Vitals:  Filed Vitals:   11/14/11 0845  BP: 176/111  Pulse:   Temp:   Resp:     Post vital signs: Reviewed  Level of consciousness: sedated  Complications: No apparent anesthesia complications

## 2011-11-14 NOTE — Anesthesia Procedure Notes (Signed)
Procedure Name: LMA Insertion Date/Time: 11/14/2011 7:44 AM Performed by: Flossie Dibble Pre-anesthesia Checklist: Emergency Drugs available, Timeout performed, Suction available, Patient identified and Patient being monitored Patient Re-evaluated:Patient Re-evaluated prior to inductionOxygen Delivery Method: Circle system utilized Preoxygenation: Pre-oxygenation with 100% oxygen Intubation Type: IV induction Ventilation: Mask ventilation without difficulty LMA: LMA inserted LMA Size: 4.0 Number of attempts: 1 Airway Equipment and Method: Patient positioned with wedge pillow Placement Confirmation: positive ETCO2 and breath sounds checked- equal and bilateral Tube secured with: Tape Dental Injury: Teeth and Oropharynx as per pre-operative assessment

## 2011-11-14 NOTE — Progress Notes (Signed)
Plan of care discussed with pt.  Questions answered.  Informed consent

## 2011-11-14 NOTE — Op Note (Signed)
Pre op dx:  Irregular menses, obesity, suspect endometrial polyps  Post op dx:  Same, path pending  Procedure:  Hysteroscopy, D&C, polypectomy  Surgeon:  Marylynn Pearson  EBL:  minimal  Specimen:  EMC with polyps  Anesthesia:  general  Complications:  none  Condition:  stable  Pt was taken to the OR after informed consent.  Anesthesia was given and she was placed in dorsal lithotomy position.  Prepped and draped in sterile condition.  Bivalve speculum placed in vaginal and single tooth tenaculum placed on anterior lip of cervix.  Cervix was serially dilated with pratt dilators.  Diagnostic hysteroscope inserted and survey of intrauterine cavity performed.  Multiple polypoid masses noted.   Hysteroscope removed and polyp forceps were used to remove several endometrial polyps.  Then, a gentle curetting performed.  Specimens placed on telfa and passed off to be sent to pathology.  Hysteroscope re-inserted & no further masses noted.   Tenaculum and speculum removed. Sponge, needle, and instrument counts correct.

## 2011-11-15 ENCOUNTER — Encounter (HOSPITAL_COMMUNITY): Payer: Self-pay | Admitting: Obstetrics and Gynecology

## 2011-11-15 DIAGNOSIS — D518 Other vitamin B12 deficiency anemias: Secondary | ICD-10-CM | POA: Diagnosis not present

## 2011-11-15 DIAGNOSIS — R0602 Shortness of breath: Secondary | ICD-10-CM | POA: Diagnosis not present

## 2011-11-15 DIAGNOSIS — N182 Chronic kidney disease, stage 2 (mild): Secondary | ICD-10-CM | POA: Diagnosis not present

## 2011-11-15 DIAGNOSIS — Z79899 Other long term (current) drug therapy: Secondary | ICD-10-CM | POA: Diagnosis not present

## 2011-11-15 DIAGNOSIS — I129 Hypertensive chronic kidney disease with stage 1 through stage 4 chronic kidney disease, or unspecified chronic kidney disease: Secondary | ICD-10-CM | POA: Diagnosis not present

## 2011-11-15 DIAGNOSIS — R609 Edema, unspecified: Secondary | ICD-10-CM | POA: Diagnosis not present

## 2011-11-15 DIAGNOSIS — D509 Iron deficiency anemia, unspecified: Secondary | ICD-10-CM | POA: Diagnosis not present

## 2011-11-15 DIAGNOSIS — L659 Nonscarring hair loss, unspecified: Secondary | ICD-10-CM | POA: Diagnosis not present

## 2011-12-04 ENCOUNTER — Encounter (HOSPITAL_COMMUNITY): Payer: Self-pay | Admitting: Emergency Medicine

## 2011-12-04 ENCOUNTER — Emergency Department (HOSPITAL_COMMUNITY): Payer: Medicare Other

## 2011-12-04 ENCOUNTER — Emergency Department (HOSPITAL_COMMUNITY)
Admission: EM | Admit: 2011-12-04 | Discharge: 2011-12-04 | Disposition: A | Discharge status: Home / Self Care | Payer: Medicare Other | Attending: Emergency Medicine | Admitting: Emergency Medicine

## 2011-12-04 DIAGNOSIS — Z882 Allergy status to sulfonamides status: Secondary | ICD-10-CM | POA: Diagnosis not present

## 2011-12-04 DIAGNOSIS — R42 Dizziness and giddiness: Secondary | ICD-10-CM

## 2011-12-04 DIAGNOSIS — R51 Headache: Secondary | ICD-10-CM | POA: Diagnosis not present

## 2011-12-04 DIAGNOSIS — Y92009 Unspecified place in unspecified non-institutional (private) residence as the place of occurrence of the external cause: Secondary | ICD-10-CM | POA: Insufficient documentation

## 2011-12-04 DIAGNOSIS — Z043 Encounter for examination and observation following other accident: Secondary | ICD-10-CM | POA: Diagnosis not present

## 2011-12-04 DIAGNOSIS — S0990XA Unspecified injury of head, initial encounter: Secondary | ICD-10-CM | POA: Diagnosis not present

## 2011-12-04 DIAGNOSIS — F17200 Nicotine dependence, unspecified, uncomplicated: Secondary | ICD-10-CM | POA: Diagnosis not present

## 2011-12-04 DIAGNOSIS — W010XXA Fall on same level from slipping, tripping and stumbling without subsequent striking against object, initial encounter: Secondary | ICD-10-CM | POA: Insufficient documentation

## 2011-12-04 MED ORDER — MECLIZINE HCL 12.5 MG PO TABS
25.0000 mg | ORAL_TABLET | Freq: Once | ORAL | Status: AC
  Administered 2011-12-04: 25 mg via ORAL
  Filled 2011-12-04: qty 2

## 2011-12-04 MED ORDER — MECLIZINE HCL 25 MG PO TABS: 25.0000 mg | ORAL_TABLET | Freq: Three times a day (TID) | ORAL | Status: AC | PRN

## 2011-12-04 MED ORDER — DIAZEPAM 5 MG PO TABS
5.0000 mg | ORAL_TABLET | Freq: Once | ORAL | Status: AC
  Administered 2011-12-04: 5 mg via ORAL
  Filled 2011-12-04: qty 1

## 2011-12-04 NOTE — ED Provider Notes (Signed)
History     CSN: [id number]  Arrival date & time 12/04/11  1238   First MD Initiated Contact with Patient 12/04/11 1311      Chief Complaint  Patient presents with  . Dizziness  . Headache    (Consider location/radiation/quality/duration/timing/severity/associated sxs/prior treatment) Patient is a 46 y.o. female presenting with headaches. The history is provided by the patient.  Headache  Pertinent negatives include no fever, no palpitations and no shortness of breath.  pt states 2 days ago had trip and fall at home, hit side of head. No loc. States since then on and off headaches, gradual onset. C/w prior headaches, not a 'worst' headache. States also has felt 'dizzy' intermittently since. Describes as room spinning sensation worse w head movement. No hearing loss or tinnitus. No hx vertigo. Pt denies any numbness or weakness. No problems w balance or gait. Denies neck pain or stiffness. No eye pain or change in vision. No nv. No fever or chills. Hx seizures, denies any recent seizures or sz prior to or since fall. Denies any recent change in medication or new medication.      Past Medical History  Diagnosis Date  . IUD     HISTORY OF IUD --REMOVED IN 2006  . Hypertension   . Anemia   . History of blood transfusion   . Anxiety   . Bipolar disorder   . Depression   . Sleep apnea 2000    resolved after gastric bypass  . Seizures 2009,2012    two seizures due to a med changes    Past Surgical History  Procedure Date  . Rotator cuff repair   . Gastric bypass 2000  . Hysteroscopy w/d&c 11/14/2011    Procedure: DILATATION AND CURETTAGE /HYSTEROSCOPY;  Surgeon: Marylynn Pearson, MD;  Location: [facility];  Service: Gynecology;;  with removal of polyps    Family History  Problem Relation Age of Onset  . Hypertension Mother   . Hypertension Father   . Diabetes Sister   . Hypertension Sister   . Hypertension Brother   . Hypertension Maternal Aunt   . Hypertension Maternal  Uncle   . Hypertension Maternal Grandmother   . Hypertension Maternal Grandfather   . Hypertension Paternal Grandmother   . Hypertension Paternal Grandfather   . Diabetes Cousin     History  Substance Use Topics  . Smoking status: Current Some Day Smoker    Types: Cigarettes    Last Attempt to Quit: 03/21/1988  . Smokeless tobacco: Never Used  . Alcohol Use: Yes     social    OB History    Grav Para Term Preterm Abortions TAB SAB Ect Mult Living   1    1  1    0      Review of Systems  Constitutional: Negative for fever and chills.  HENT: Negative for neck pain and neck stiffness.   Eyes: Negative for pain and visual disturbance.  Respiratory: Negative for shortness of breath.   Cardiovascular: Negative for chest pain, palpitations and leg swelling.  Gastrointestinal: Negative for abdominal pain.  Genitourinary: Negative for dysuria and flank pain.  Musculoskeletal: Negative for back pain.  Skin: Negative for rash.  Neurological: Positive for headaches. Negative for syncope, weakness and numbness.  Hematological: Does not bruise/bleed easily.  Psychiatric/Behavioral: Negative for confusion.    Allergies  Sulfa antibiotics  Home Medications   Current Outpatient Rx  Name Route Sig Dispense Refill  . AMLODIPINE BESYLATE 5 MG PO TABS Oral Take  5 mg by mouth daily.    Marland Kitchen BIOTIN PO Oral Take 1 tablet by mouth daily.    Marland Kitchen CALCIUM PO Oral Take 1 tablet by mouth daily.    . CELECOXIB 200 MG PO CAPS Oral Take 200 mg by mouth daily.    Marland Kitchen CINNAMON PO Oral Take 1 capsule by mouth daily.    Marland Kitchen CLONIDINE HCL 0.1 MG PO TABS Oral Take 0.1 mg by mouth 2 (two) times daily.    Marland Kitchen VITAMIN B-12 PO Oral Take 1 tablet by mouth daily.    . CYCLOBENZAPRINE HCL 10 MG PO TABS Oral Take 10 mg by mouth 3 (three) times daily as needed. For shoulder pain    . DIAZEPAM 5 MG PO TABS Oral Take 5-10 mg by mouth every 12 (twelve) hours as needed. For anxiety    . FUROSEMIDE 20 MG PO TABS Oral Take 20  mg by mouth daily.    . IRON PO Oral Take 1 tablet by mouth daily.    Marland Kitchen LAMOTRIGINE 150 MG PO TABS Oral Take 300 mg by mouth daily.    Marland Kitchen LIDOCAINE 5 % EX PTCH Transdermal Place 1 patch onto the skin daily as needed. For shoulder pain Remove & Discard patch within 12 hours or as directed by MD    . MAGNESIUM PO Oral Take 1 tablet by mouth daily.    Marland Kitchen OVER THE COUNTER MEDICATION Oral Take 1 tablet by mouth daily. Caralluma    . TOPIRAMATE 50 MG PO TABS Oral Take 200 mg by mouth at bedtime.    Marland Kitchen VITAMIN E PO Oral Take 1 tablet by mouth daily.      BP 130/98  Pulse 109  Temp 98.3 F (36.8 C) (Oral)  Resp 20  Ht 5\' 2"  (1.575 m)  Wt 267 lb (121.11 kg)  BMI 48.83 kg/m2  SpO2 100%  LMP 11/27/2011  Physical Exam  Nursing note and vitals reviewed. Constitutional: She is oriented to person, place, and time. She appears well-developed and well-nourished. No distress.  HENT:  Head: Atraumatic.  Nose: Nose normal.  Mouth/Throat: Oropharynx is clear and moist.       No sinus or temporal tenderness.  tms wnl  Eyes: Conjunctivae normal and EOM are normal. Pupils are equal, round, and reactive to light. No scleral icterus.  Neck: Normal range of motion. Neck supple. No tracheal deviation present. No thyromegaly present.       No stiffness or rigidity.   Cardiovascular: Normal rate, regular rhythm, normal heart sounds and intact distal pulses.  Exam reveals no gallop and no friction rub.   No murmur heard. Pulmonary/Chest: Effort normal and breath sounds normal. No respiratory distress.  Abdominal: Soft. Normal appearance and bowel sounds are normal. She exhibits no distension. There is no tenderness.  Genitourinary:       No cva tenderness.  Musculoskeletal: Normal range of motion. She exhibits no edema and no tenderness.  Neurological: She is alert and oriented to person, place, and time. No cranial nerve deficit.       Motor intact bilaterally. Steady gait. No nystagmus.   Skin: Skin is  warm and dry. No rash noted. She is not diaphoretic.  Psychiatric: She has a normal mood and affect.    ED Course  Procedures (including critical care time)  Ct Head Wo Contrast  12/04/2011  *RADIOLOGY REPORT*  Clinical Data: Fall  CT HEAD WITHOUT CONTRAST  Technique:  Contiguous axial images were obtained from the base of the  skull through the vertex without contrast.  Comparison: 08/26/2010  Findings: Chronic ischemic changes in the periventricular white matter and right external capsule are not significantly changed. No mass effect, midline shift, or acute intracranial hemorrhage. Stable appearance of the cranium.  Mastoid air cells clear. Visualized paranasal sinuses are clear.  No skull fracture.  IMPRESSION: No acute intracranial pathology.   Original Report Authenticated By: Jamas Lav, M.D.       MDM  Ct. antivert po, valium po.  Reviewed nursing notes and prior charts for additional history.   Pt feels improved. Hr 88 rr 16. No nv.         Mirna Mires, MD 12/04/11 1420

## 2011-12-04 NOTE — ED Notes (Addendum)
Fell and hit head x 3 days. Dizziness started last night when eyes are open. Pt stable ambulatory. Denies having loc. Denies n/v since. Nad. Alert/oriented. H/a since fall. Pt states does not really know how she fell

## 2011-12-14 DIAGNOSIS — Z30430 Encounter for insertion of intrauterine contraceptive device: Secondary | ICD-10-CM | POA: Diagnosis not present

## 2012-01-26 DIAGNOSIS — N920 Excessive and frequent menstruation with regular cycle: Secondary | ICD-10-CM | POA: Diagnosis not present

## 2012-02-10 DIAGNOSIS — M25519 Pain in unspecified shoulder: Secondary | ICD-10-CM | POA: Diagnosis not present

## 2012-03-09 DIAGNOSIS — M25519 Pain in unspecified shoulder: Secondary | ICD-10-CM | POA: Diagnosis not present

## 2012-04-06 DIAGNOSIS — I10 Essential (primary) hypertension: Secondary | ICD-10-CM | POA: Diagnosis not present

## 2012-04-06 DIAGNOSIS — M25519 Pain in unspecified shoulder: Secondary | ICD-10-CM | POA: Diagnosis not present

## 2012-04-10 ENCOUNTER — Emergency Department (HOSPITAL_COMMUNITY): Payer: Medicare Other

## 2012-04-10 ENCOUNTER — Emergency Department (HOSPITAL_COMMUNITY)
Admission: EM | Admit: 2012-04-10 | Discharge: 2012-04-10 | Disposition: A | Discharge status: Home / Self Care | Payer: Medicare Other | Attending: Emergency Medicine | Admitting: Emergency Medicine

## 2012-04-10 ENCOUNTER — Encounter (HOSPITAL_COMMUNITY): Payer: Self-pay | Admitting: Emergency Medicine

## 2012-04-10 DIAGNOSIS — G4733 Obstructive sleep apnea (adult) (pediatric): Secondary | ICD-10-CM | POA: Insufficient documentation

## 2012-04-10 DIAGNOSIS — G40909 Epilepsy, unspecified, not intractable, without status epilepticus: Secondary | ICD-10-CM | POA: Diagnosis not present

## 2012-04-10 DIAGNOSIS — M25559 Pain in unspecified hip: Secondary | ICD-10-CM

## 2012-04-10 DIAGNOSIS — I10 Essential (primary) hypertension: Secondary | ICD-10-CM | POA: Insufficient documentation

## 2012-04-10 DIAGNOSIS — Z8742 Personal history of other diseases of the female genital tract: Secondary | ICD-10-CM | POA: Diagnosis not present

## 2012-04-10 DIAGNOSIS — Z8659 Personal history of other mental and behavioral disorders: Secondary | ICD-10-CM | POA: Insufficient documentation

## 2012-04-10 DIAGNOSIS — R109 Unspecified abdominal pain: Secondary | ICD-10-CM | POA: Insufficient documentation

## 2012-04-10 DIAGNOSIS — Z87891 Personal history of nicotine dependence: Secondary | ICD-10-CM | POA: Insufficient documentation

## 2012-04-10 DIAGNOSIS — F319 Bipolar disorder, unspecified: Secondary | ICD-10-CM | POA: Diagnosis not present

## 2012-04-10 DIAGNOSIS — Z862 Personal history of diseases of the blood and blood-forming organs and certain disorders involving the immune mechanism: Secondary | ICD-10-CM | POA: Insufficient documentation

## 2012-04-10 DIAGNOSIS — Z79899 Other long term (current) drug therapy: Secondary | ICD-10-CM | POA: Diagnosis not present

## 2012-04-10 LAB — URINALYSIS, ROUTINE W REFLEX MICROSCOPIC
Bilirubin Urine: NEGATIVE
Glucose, UA: NEGATIVE mg/dL
Ketones, ur: NEGATIVE mg/dL
Leukocytes, UA: NEGATIVE
Nitrite: NEGATIVE
Protein, ur: NEGATIVE mg/dL
Specific Gravity, Urine: 1.01 (ref 1.005–1.030)
Urobilinogen, UA: 0.2 mg/dL (ref 0.0–1.0)
pH: 5.5 (ref 5.0–8.0)

## 2012-04-10 LAB — URINE MICROSCOPIC-ADD ON

## 2012-04-10 MED ORDER — HYDROCODONE-ACETAMINOPHEN 5-325 MG PO TABS: 2.0000 | ORAL_TABLET | Freq: Four times a day (QID) | ORAL | Status: DC | PRN

## 2012-04-10 MED ORDER — KETOROLAC TROMETHAMINE 60 MG/2ML IM SOLN
60.0000 mg | Freq: Once | INTRAMUSCULAR | Status: AC
  Administered 2012-04-10: 60 mg via INTRAMUSCULAR
  Filled 2012-04-10: qty 2

## 2012-04-10 NOTE — ED Notes (Signed)
Patient ambulatory to restroom  ?

## 2012-04-10 NOTE — ED Notes (Signed)
Patient resting in bed with eyes closed. NAD noted at this time.

## 2012-04-10 NOTE — ED Notes (Signed)
Patient c/o right sided pain; points to right hip area when asked where pain is.  Patient denies back pain; states pain hurts worse when she moves.

## 2012-04-10 NOTE — ED Provider Notes (Addendum)
History     CSN: [id number]  Arrival date & time 04/10/12  0022   First MD Initiated Contact with Patient 04/10/12 [phone]      Chief Complaint  Patient presents with  . Hip Pain    (Consider location/radiation/quality/duration/timing/severity/associated sxs/prior treatment) HPI Comments: The patient presents here with pain in the right hip and side.  She denies any injury or trauma.  There are no urinary or bowel complaints.  No fevers or chills.    Patient is a 47 y.o. female presenting with hip pain. The history is provided by the patient.  Hip Pain This is a new problem. The current episode started yesterday. The problem occurs constantly. The problem has been gradually worsening. Pertinent negatives include no abdominal pain. Exacerbated by: movement, position, palpation. Nothing relieves the symptoms. She has tried nothing for the symptoms.    Past Medical History  Diagnosis Date  . IUD     HISTORY OF IUD --REMOVED IN 2006  . Hypertension   . Anemia   . History of blood transfusion   . Anxiety   . Bipolar disorder   . Depression   . Sleep apnea 2000    resolved after gastric bypass  . Seizures 2009,2012    two seizures due to a med changes    Past Surgical History  Procedure Date  . Rotator cuff repair   . Gastric bypass 2000  . Hysteroscopy w/d&c 11/14/2011    Procedure: DILATATION AND CURETTAGE /HYSTEROSCOPY;  Surgeon: Marylynn Pearson, MD;  Location: [facility];  Service: Gynecology;;  with removal of polyps    Family History  Problem Relation Age of Onset  . Hypertension Mother   . Hypertension Father   . Diabetes Sister   . Hypertension Sister   . Hypertension Brother   . Hypertension Maternal Aunt   . Hypertension Maternal Uncle   . Hypertension Maternal Grandmother   . Hypertension Maternal Grandfather   . Hypertension Paternal Grandmother   . Hypertension Paternal Grandfather   . Diabetes Cousin     History  Substance Use Topics  . Smoking status:  Former Smoker    Types: Cigarettes    Quit date: 03/21/1988  . Smokeless tobacco: Never Used  . Alcohol Use: Yes     Comment: social    OB History    Grav Para Term Preterm Abortions TAB SAB Ect Mult Living   1    1  1    0      Review of Systems  Gastrointestinal: Negative for abdominal pain.  All other systems reviewed and are negative.    Allergies  Sulfa antibiotics  Home Medications   Current Outpatient Rx  Name  Route  Sig  Dispense  Refill  . AMLODIPINE BESYLATE 5 MG PO TABS   Oral   Take 5 mg by mouth daily.         Marland Kitchen BIOTIN PO   Oral   Take 1 tablet by mouth daily.         Marland Kitchen CALCIUM PO   Oral   Take 1 tablet by mouth daily.         . CELECOXIB 200 MG PO CAPS   Oral   Take 200 mg by mouth daily.         Marland Kitchen CINNAMON PO   Oral   Take 1 capsule by mouth daily.         Marland Kitchen CLONIDINE HCL 0.1 MG PO TABS   Oral   Take  0.1 mg by mouth 2 (two) times daily.         Marland Kitchen VITAMIN B-12 PO   Oral   Take 1 tablet by mouth daily.         . CYCLOBENZAPRINE HCL 10 MG PO TABS   Oral   Take 10 mg by mouth 3 (three) times daily as needed. For shoulder pain         . DIAZEPAM 5 MG PO TABS   Oral   Take 5-10 mg by mouth every 12 (twelve) hours as needed. For anxiety         . FUROSEMIDE 20 MG PO TABS   Oral   Take 20 mg by mouth daily.         . IRON PO   Oral   Take 1 tablet by mouth daily.         Marland Kitchen LAMOTRIGINE 150 MG PO TABS   Oral   Take 300 mg by mouth daily.         Marland Kitchen LIDOCAINE 5 % EX PTCH   Transdermal   Place 1 patch onto the skin daily as needed. For shoulder pain Remove & Discard patch within 12 hours or as directed by MD         . MAGNESIUM PO   Oral   Take 1 tablet by mouth daily.         Marland Kitchen OVER THE COUNTER MEDICATION   Oral   Take 1 tablet by mouth daily. Caralluma         . TOPIRAMATE 50 MG PO TABS   Oral   Take 200 mg by mouth at bedtime.         Marland Kitchen VITAMIN E PO   Oral   Take 1 tablet by mouth  daily.           BP 132/94  Pulse 57  Temp 98 F (36.7 C) (Oral)  Resp 18  Ht 5' 2.5" (1.588 m)  Wt 233 lb (105.688 kg)  BMI 41.94 kg/m2  SpO2 100%  Physical Exam  Nursing note and vitals reviewed. Constitutional: She is oriented to person, place, and time. She appears well-developed and well-nourished. No distress.  HENT:  Head: Normocephalic and atraumatic.  Mouth/Throat: Oropharynx is clear and moist.  Neck: Normal range of motion. Neck supple.  Cardiovascular: Normal rate and regular rhythm.   No murmur heard. Pulmonary/Chest: Effort normal and breath sounds normal. No respiratory distress.  Abdominal: Soft. Bowel sounds are normal. She exhibits no distension. There is no tenderness.       The patient is morbidly obese and has multiple skin folds which obscure a reliable exam.  She is ttp in the right mid abdomen and asis.    Musculoskeletal: Normal range of motion. She exhibits no edema.  Neurological: She is alert and oriented to person, place, and time.  Skin: Skin is warm and dry. She is not diaphoretic.    ED Course  Procedures (including critical care time)   Labs Reviewed  URINALYSIS, ROUTINE W REFLEX MICROSCOPIC   No results found.   No diagnosis found.    MDM  This patient presents with pain in the right hip and lower abdomen.  The exam was difficult due to body habitus and a ct was obtained.  This was negative for intra-abdominal process.  She was medicated and is feeling better.  Will discharge with pain meds, rest, time.  Return prn.        Nathaneil Canary  Janae Bonser, MD 04/10/12 [place], MD 04/10/12 [phone]

## 2012-04-15 ENCOUNTER — Encounter (HOSPITAL_COMMUNITY): Payer: Self-pay | Admitting: Emergency Medicine

## 2012-04-15 ENCOUNTER — Emergency Department (HOSPITAL_COMMUNITY)
Admission: EM | Admit: 2012-04-15 | Discharge: 2012-04-15 | Disposition: A | Discharge status: Home / Self Care | Payer: Medicare Other | Attending: Emergency Medicine | Admitting: Emergency Medicine

## 2012-04-15 DIAGNOSIS — F411 Generalized anxiety disorder: Secondary | ICD-10-CM | POA: Insufficient documentation

## 2012-04-15 DIAGNOSIS — Z87891 Personal history of nicotine dependence: Secondary | ICD-10-CM | POA: Diagnosis not present

## 2012-04-15 DIAGNOSIS — E669 Obesity, unspecified: Secondary | ICD-10-CM | POA: Insufficient documentation

## 2012-04-15 DIAGNOSIS — Z3202 Encounter for pregnancy test, result negative: Secondary | ICD-10-CM | POA: Insufficient documentation

## 2012-04-15 DIAGNOSIS — Z8709 Personal history of other diseases of the respiratory system: Secondary | ICD-10-CM | POA: Insufficient documentation

## 2012-04-15 DIAGNOSIS — R110 Nausea: Secondary | ICD-10-CM | POA: Diagnosis not present

## 2012-04-15 DIAGNOSIS — Z8669 Personal history of other diseases of the nervous system and sense organs: Secondary | ICD-10-CM | POA: Insufficient documentation

## 2012-04-15 DIAGNOSIS — M545 Low back pain, unspecified: Secondary | ICD-10-CM | POA: Insufficient documentation

## 2012-04-15 DIAGNOSIS — Z9884 Bariatric surgery status: Secondary | ICD-10-CM | POA: Insufficient documentation

## 2012-04-15 DIAGNOSIS — Z79899 Other long term (current) drug therapy: Secondary | ICD-10-CM | POA: Insufficient documentation

## 2012-04-15 DIAGNOSIS — R109 Unspecified abdominal pain: Secondary | ICD-10-CM | POA: Diagnosis not present

## 2012-04-15 DIAGNOSIS — R1011 Right upper quadrant pain: Secondary | ICD-10-CM | POA: Insufficient documentation

## 2012-04-15 DIAGNOSIS — I10 Essential (primary) hypertension: Secondary | ICD-10-CM | POA: Diagnosis not present

## 2012-04-15 DIAGNOSIS — Z862 Personal history of diseases of the blood and blood-forming organs and certain disorders involving the immune mechanism: Secondary | ICD-10-CM | POA: Insufficient documentation

## 2012-04-15 DIAGNOSIS — Z8659 Personal history of other mental and behavioral disorders: Secondary | ICD-10-CM | POA: Diagnosis not present

## 2012-04-15 DIAGNOSIS — R7989 Other specified abnormal findings of blood chemistry: Secondary | ICD-10-CM | POA: Diagnosis not present

## 2012-04-15 LAB — URINALYSIS, ROUTINE W REFLEX MICROSCOPIC
Glucose, UA: NEGATIVE mg/dL
Hgb urine dipstick: NEGATIVE
Leukocytes, UA: NEGATIVE
Nitrite: NEGATIVE
Protein, ur: 30 mg/dL — AB
Specific Gravity, Urine: 1.025 (ref 1.005–1.030)
Urobilinogen, UA: 0.2 mg/dL (ref 0.0–1.0)
pH: 6 (ref 5.0–8.0)

## 2012-04-15 LAB — COMPREHENSIVE METABOLIC PANEL
ALT: 55 U/L — ABNORMAL HIGH (ref 0–35)
AST: 50 U/L — ABNORMAL HIGH (ref 0–37)
Albumin: 3.8 g/dL (ref 3.5–5.2)
Alkaline Phosphatase: 132 U/L — ABNORMAL HIGH (ref 39–117)
BUN: 30 mg/dL — ABNORMAL HIGH (ref 6–23)
CO2: 27 mEq/L (ref 19–32)
Calcium: 9.5 mg/dL (ref 8.4–10.5)
Chloride: 105 mEq/L (ref 96–112)
Creatinine, Ser: 2.79 mg/dL — ABNORMAL HIGH (ref 0.50–1.10)
GFR calc Af Amer: 22 mL/min — ABNORMAL LOW (ref >90)
GFR calc non Af Amer: 19 mL/min — ABNORMAL LOW (ref >90)
Glucose, Bld: 93 mg/dL (ref 70–99)
Potassium: 3.6 mEq/L (ref 3.5–5.1)
Sodium: 143 mEq/L (ref 135–145)
Total Bilirubin: 0.8 mg/dL (ref 0.3–1.2)
Total Protein: 7 g/dL (ref 6.0–8.3)

## 2012-04-15 LAB — URINE MICROSCOPIC-ADD ON

## 2012-04-15 LAB — CBC WITH DIFFERENTIAL/PLATELET
Basophils Absolute: 0 10*3/uL (ref 0.0–0.1)
Basophils Relative: 1 % (ref 0–1)
Eosinophils Absolute: 0.3 10*3/uL (ref 0.0–0.7)
Eosinophils Relative: 5 % (ref 0–5)
HCT: 41.6 % (ref 36.0–46.0)
Hemoglobin: 13.2 g/dL (ref 12.0–15.0)
Lymphocytes Relative: 34 % (ref 12–46)
Lymphs Abs: 1.9 10*3/uL (ref 0.7–4.0)
MCH: 28.6 pg (ref 26.0–34.0)
MCHC: 31.7 g/dL (ref 30.0–36.0)
MCV: 90.2 fL (ref 78.0–100.0)
Monocytes Absolute: 0.8 10*3/uL (ref 0.1–1.0)
Monocytes Relative: 14 % — ABNORMAL HIGH (ref 3–12)
Neutro Abs: 2.5 10*3/uL (ref 1.7–7.7)
Neutrophils Relative %: 46 % (ref 43–77)
Platelets: 216 10*3/uL (ref 150–400)
RBC: 4.61 MIL/uL (ref 3.87–5.11)
RDW: 16.9 % — ABNORMAL HIGH (ref 11.5–15.5)
WBC: 5.4 10*3/uL (ref 4.0–10.5)

## 2012-04-15 LAB — LIPASE, BLOOD: Lipase: 47 U/L (ref 11–59)

## 2012-04-15 LAB — PREGNANCY, URINE: Preg Test, Ur: NEGATIVE

## 2012-04-15 MED ORDER — ONDANSETRON 8 MG PO TBDP: 8.0000 mg | ORAL_TABLET | Freq: Three times a day (TID) | ORAL | Status: DC | PRN

## 2012-04-15 MED ORDER — HYDROMORPHONE HCL PF 1 MG/ML IJ SOLN
1.0000 mg | Freq: Once | INTRAMUSCULAR | Status: AC
  Administered 2012-04-15: 1 mg via INTRAVENOUS
  Filled 2012-04-15: qty 1

## 2012-04-15 MED ORDER — ONDANSETRON HCL 4 MG/2ML IJ SOLN
4.0000 mg | Freq: Once | INTRAMUSCULAR | Status: AC
  Administered 2012-04-15: 4 mg via INTRAVENOUS
  Filled 2012-04-15: qty 2

## 2012-04-15 MED ORDER — SODIUM CHLORIDE 0.9 % IV SOLN: 1000.0000 mL | INTRAVENOUS | Status: DC

## 2012-04-15 MED ORDER — SODIUM CHLORIDE 0.9 % IV SOLN
1000.0000 mL | Freq: Once | INTRAVENOUS | Status: AC
  Administered 2012-04-15: 1000 mL via INTRAVENOUS

## 2012-04-15 MED ORDER — CEPHALEXIN 500 MG PO CAPS: 500.0000 mg | ORAL_CAPSULE | Freq: Three times a day (TID) | ORAL | Status: DC

## 2012-04-15 NOTE — ED Notes (Signed)
Patient states she does not need anything at this time. 

## 2012-04-15 NOTE — ED Notes (Signed)
Pt c/o abd pain that started last week, Dr. Tomi Bamberger in prior to RN, see EDP assessment for further,

## 2012-04-15 NOTE — ED Notes (Signed)
RN at bedside

## 2012-04-15 NOTE — ED Notes (Signed)
Patient seen here on 1/21 for right lower abd pain, diagnosed with possible pulled muscle. Patient was given prescription for hydrocodone reports slight improvement in pain for short time and now states "The pain is now going to my back." Patient denies any fever, diarrhea, or vomiting. Patient reports nausea and feeling like she needs to have a BM but can not. Per patient last BM ?3 days ago which is not normal for her.

## 2012-04-15 NOTE — ED Provider Notes (Addendum)
History  This chart was scribed for Nicole Frames, MD by Lannette Donath ED Scribe. The patient was seen in room [facility]. Patient's care was started at 0847.  CSN: [id number]  Arrival date & time 04/15/12  [id number]   First MD Initiated Contact with Patient 04/15/12 [phone]      Chief Complaint  Patient presents with  . Abdominal Pain  . Back Pain     The history is provided by the patient. No language interpreter was used.    HPI Comments: [name] is a 47 y.o. female with history of hypertension who presents to the Emergency Department complaining of constant, dull, moderate right lower abdominal pain that radiates to her right lower back onset 5 days ago. There is associated nausea.  Patient was seen on 04/10/12 here for non-radiating right lower abdominal pain and had a CT done with no lab work. She was diagnosed with a possible pulled muscle. Patient claims that hydrocodone given for pain provided slight improvement, but pain never completely went away. She has not followed up with a PCP for this problem. Patient denies vomiting, diarrhea, fever or trouble urinating. Patient is taking medicines for hypertension, but denies any other regular medication use. Patient denies history of diabetes.   Past Medical History  Diagnosis Date  . IUD     HISTORY OF IUD --REMOVED IN 2006  . Hypertension   . Anemia   . History of blood transfusion   . Anxiety   . Bipolar disorder   . Depression   . Sleep apnea 2000    resolved after gastric bypass  . Seizures 2009,2012    two seizures due to a med changes    Past Surgical History  Procedure Date  . Rotator cuff repair   . Gastric bypass 2000  . Hysteroscopy w/d&c 11/14/2011    Procedure: DILATATION AND CURETTAGE /HYSTEROSCOPY;  Surgeon: Marylynn Pearson, MD;  Location: [facility];  Service: Gynecology;;  with removal of polyps  cholecystectomy  Family History  Problem Relation Age of Onset  . Hypertension Mother   . Hypertension  Father   . Diabetes Sister   . Hypertension Sister   . Hypertension Brother   . Hypertension Maternal Aunt   . Hypertension Maternal Uncle   . Hypertension Maternal Grandmother   . Hypertension Maternal Grandfather   . Hypertension Paternal Grandmother   . Hypertension Paternal Grandfather   . Diabetes Cousin     History  Substance Use Topics  . Smoking status: Former Smoker -- 0.0 packs/day    Types: Cigarettes    Quit date: 03/21/1988  . Smokeless tobacco: Never Used  . Alcohol Use: Yes     Comment: social    OB History    Grav Para Term Preterm Abortions TAB SAB Ect Mult Living   1    1  1    0      Review of Systems A complete 10 system review of systems was obtained and all systems are negative except as noted in the HPI and PMH.   Allergies  Sulfa antibiotics  Home Medications   Current Outpatient Rx  Name  Route  Sig  Dispense  Refill  . AMLODIPINE BESYLATE 5 MG PO TABS   Oral   Take 5 mg by mouth daily.         Marland Kitchen BIOTIN PO   Oral   Take 1 tablet by mouth daily.         Marland Kitchen CALCIUM PO  Oral   Take 1 tablet by mouth daily.         . CELECOXIB 200 MG PO CAPS   Oral   Take 200 mg by mouth daily.         Marland Kitchen CINNAMON PO   Oral   Take 1 capsule by mouth daily.         Marland Kitchen CLONIDINE HCL 0.1 MG PO TABS   Oral   Take 0.1 mg by mouth 2 (two) times daily.         Marland Kitchen VITAMIN B-12 PO   Oral   Take 1 tablet by mouth daily.         . CYCLOBENZAPRINE HCL 10 MG PO TABS   Oral   Take 10 mg by mouth 3 (three) times daily as needed. For shoulder pain         . DIAZEPAM 5 MG PO TABS   Oral   Take 5-10 mg by mouth every 12 (twelve) hours as needed. For anxiety         . FUROSEMIDE 20 MG PO TABS   Oral   Take 20 mg by mouth daily.         Marland Kitchen HYDROCODONE-ACETAMINOPHEN 5-325 MG PO TABS   Oral   Take 2 tablets by mouth every 6 (six) hours as needed for pain.   15 tablet   0   . HYDROCODONE-ACETAMINOPHEN 5-325 MG PO TABS   Oral   Take 2  tablets by mouth every 6 (six) hours as needed for pain.   15 tablet   0   . IRON PO   Oral   Take 1 tablet by mouth daily.         Marland Kitchen LAMOTRIGINE 150 MG PO TABS   Oral   Take 300 mg by mouth daily.         Marland Kitchen LIDOCAINE 5 % EX PTCH   Transdermal   Place 1 patch onto the skin daily as needed. For shoulder pain Remove & Discard patch within 12 hours or as directed by MD         . MAGNESIUM PO   Oral   Take 1 tablet by mouth daily.         Marland Kitchen OVER THE COUNTER MEDICATION   Oral   Take 1 tablet by mouth daily. Caralluma         . TOPIRAMATE 50 MG PO TABS   Oral   Take 200 mg by mouth at bedtime.         Marland Kitchen VITAMIN E PO   Oral   Take 1 tablet by mouth daily.           Triage Vitals: BP 131/85  Pulse 90  Temp 97.6 F (36.4 C) (Oral)  Resp 16  Ht 5\' 2"  (1.575 m)  Wt 233 lb (105.688 kg)  BMI 42.62 kg/m2  SpO2 97%  Physical Exam  Nursing note and vitals reviewed. Constitutional: She is oriented to person, place, and time. She appears well-developed and well-nourished. No distress.       Obese. Appears uncomfortable.  HENT:  Head: Normocephalic and atraumatic.  Right Ear: External ear normal.  Left Ear: External ear normal.  Eyes: Conjunctivae normal are normal. Right eye exhibits no discharge. Left eye exhibits no discharge. No scleral icterus.  Neck: Neck supple. No tracheal deviation present.  Cardiovascular: Normal rate, regular rhythm and intact distal pulses.   Pulmonary/Chest: Effort normal and breath sounds normal. No stridor. No  respiratory distress. She has no wheezes. She has no rales.  Abdominal: Soft. Bowel sounds are normal. She exhibits no distension. There is tenderness in the right upper quadrant. There is guarding and CVA tenderness (right). There is no rebound.  Musculoskeletal: She exhibits no edema and no tenderness.  Neurological: She is alert and oriented to person, place, and time. She has normal strength. No sensory deficit. Cranial  nerve deficit:  no gross defecits noted. She exhibits normal muscle tone. She displays no seizure activity. Coordination normal.  Skin: Skin is warm and dry. No rash noted. She is not diaphoretic. No erythema.  Psychiatric: She has a normal mood and affect.    ED Course  Procedures (including critical care time) DIAGNOSTIC STUDIES: Oxygen Saturation is 97% on room air, adequate by my interpretation.    COORDINATION OF CARE: [id number] -Patient informed of current plan for treatment and evaluation and agrees with plan at this time.   Old records were reviewed: Patient had a CT abdomen and pelvis a few days ago. It did show some inflammation in the soft tissue of the abdomen. An incidental hernia was also noted but no evidence of incarceration or strangulation. Labs Reviewed  COMPREHENSIVE METABOLIC PANEL - Abnormal; Notable for the following:    BUN 30 (*)     Creatinine, Ser 2.79 (*)     AST 50 (*)     ALT 55 (*)     Alkaline Phosphatase 132 (*)     GFR calc non Af Amer 19 (*)     GFR calc Af Amer 22 (*)     All other components within normal limits  URINALYSIS, ROUTINE W REFLEX MICROSCOPIC - Abnormal; Notable for the following:    APPearance HAZY (*)     Bilirubin Urine SMALL (*)     Ketones, ur TRACE (*)     Protein, ur 30 (*)     All other components within normal limits  CBC WITH DIFFERENTIAL - Abnormal; Notable for the following:    RDW 16.9 (*)     Monocytes Relative 14 (*)     All other components within normal limits  URINE MICROSCOPIC-ADD ON - Abnormal; Notable for the following:    Squamous Epithelial / LPF MANY (*)     All other components within normal limits  LIPASE, BLOOD  PREGNANCY, URINE   No results found.    MDM  Elevated LFTs. Old records were reviewed. The patient has a history of elevated LFTs in the past.  3 years ago her AST and ALT were elevated at 53 and 107 and alkaline phosphatase was 180.  Patient does have tenderness to palpation in the right upper  quadrant. Non contrast CT did not show any evidence of any acute abnormalities and the patient states she's had her gallbladder removed.Ultrasound is not available today. Patient should have her LFTs repeated.  Elevated BUN and creatinine Patient does have history of renal insufficiency. 9 months ago her creatinine was 2.8 however 5 months ago it was 1.75. Today it's elevated at 2.79. Really abnormal but I do not think is related to the pain that she is having today. Imaging tests that she had yesterday was without IV contrast.  Abdominal pain Patient's exam does not suggest cellulitis. There is no discrete mass.CT scan result yesterday showed some soft tissue inflammation.patient does have a rather large pannus. It is possible that she could be developing a panniculitis. I will start her on a course of antibiotics although not  certain she has an infection.   I personally performed the services described in this documentation, which was scribed in my presence.  The recorded information has been reviewed and is accurate.    Nicole Frames, MD 04/15/12 [name] Vincent, MD 04/15/12 [phone]

## 2012-04-16 ENCOUNTER — Encounter (HOSPITAL_COMMUNITY): Payer: Self-pay | Admitting: *Deleted

## 2012-04-16 ENCOUNTER — Emergency Department (HOSPITAL_COMMUNITY): Payer: Medicare Other

## 2012-04-16 ENCOUNTER — Emergency Department (HOSPITAL_COMMUNITY)
Admission: EM | Admit: 2012-04-16 | Discharge: 2012-04-16 | Disposition: A | Discharge status: Home / Self Care | Payer: Medicare Other | Attending: Emergency Medicine | Admitting: Emergency Medicine

## 2012-04-16 DIAGNOSIS — Z87891 Personal history of nicotine dependence: Secondary | ICD-10-CM | POA: Diagnosis not present

## 2012-04-16 DIAGNOSIS — R112 Nausea with vomiting, unspecified: Secondary | ICD-10-CM | POA: Insufficient documentation

## 2012-04-16 DIAGNOSIS — Z9089 Acquired absence of other organs: Secondary | ICD-10-CM | POA: Diagnosis not present

## 2012-04-16 DIAGNOSIS — Z9884 Bariatric surgery status: Secondary | ICD-10-CM | POA: Diagnosis not present

## 2012-04-16 DIAGNOSIS — G40909 Epilepsy, unspecified, not intractable, without status epilepticus: Secondary | ICD-10-CM | POA: Diagnosis not present

## 2012-04-16 DIAGNOSIS — F3289 Other specified depressive episodes: Secondary | ICD-10-CM | POA: Insufficient documentation

## 2012-04-16 DIAGNOSIS — Z862 Personal history of diseases of the blood and blood-forming organs and certain disorders involving the immune mechanism: Secondary | ICD-10-CM | POA: Insufficient documentation

## 2012-04-16 DIAGNOSIS — R109 Unspecified abdominal pain: Secondary | ICD-10-CM | POA: Diagnosis not present

## 2012-04-16 DIAGNOSIS — F329 Major depressive disorder, single episode, unspecified: Secondary | ICD-10-CM | POA: Diagnosis not present

## 2012-04-16 DIAGNOSIS — F411 Generalized anxiety disorder: Secondary | ICD-10-CM | POA: Diagnosis not present

## 2012-04-16 DIAGNOSIS — Z791 Long term (current) use of non-steroidal anti-inflammatories (NSAID): Secondary | ICD-10-CM | POA: Diagnosis not present

## 2012-04-16 DIAGNOSIS — I10 Essential (primary) hypertension: Secondary | ICD-10-CM | POA: Insufficient documentation

## 2012-04-16 DIAGNOSIS — F319 Bipolar disorder, unspecified: Secondary | ICD-10-CM | POA: Insufficient documentation

## 2012-04-16 DIAGNOSIS — R110 Nausea: Secondary | ICD-10-CM | POA: Diagnosis not present

## 2012-04-16 DIAGNOSIS — Z79899 Other long term (current) drug therapy: Secondary | ICD-10-CM | POA: Diagnosis not present

## 2012-04-16 LAB — COMPREHENSIVE METABOLIC PANEL
ALT: 51 U/L — ABNORMAL HIGH (ref 0–35)
AST: 46 U/L — ABNORMAL HIGH (ref 0–37)
Albumin: 3.5 g/dL (ref 3.5–5.2)
Alkaline Phosphatase: 119 U/L — ABNORMAL HIGH (ref 39–117)
BUN: 25 mg/dL — ABNORMAL HIGH (ref 6–23)
CO2: 26 mEq/L (ref 19–32)
Calcium: 9 mg/dL (ref 8.4–10.5)
Chloride: 108 mEq/L (ref 96–112)
Creatinine, Ser: 2.73 mg/dL — ABNORMAL HIGH (ref 0.50–1.10)
GFR calc Af Amer: 23 mL/min — ABNORMAL LOW (ref >90)
GFR calc non Af Amer: 20 mL/min — ABNORMAL LOW (ref >90)
Glucose, Bld: 95 mg/dL (ref 70–99)
Potassium: 3.6 mEq/L (ref 3.5–5.1)
Sodium: 145 mEq/L (ref 135–145)
Total Bilirubin: 0.7 mg/dL (ref 0.3–1.2)
Total Protein: 6.4 g/dL (ref 6.0–8.3)

## 2012-04-16 LAB — CBC WITH DIFFERENTIAL/PLATELET
Basophils Absolute: 0 10*3/uL (ref 0.0–0.1)
Basophils Relative: 1 % (ref 0–1)
Eosinophils Absolute: 0.2 10*3/uL (ref 0.0–0.7)
Eosinophils Relative: 4 % (ref 0–5)
HCT: 39.4 % (ref 36.0–46.0)
Hemoglobin: 12.5 g/dL (ref 12.0–15.0)
Lymphocytes Relative: 38 % (ref 12–46)
Lymphs Abs: 1.5 10*3/uL (ref 0.7–4.0)
MCH: 28.5 pg (ref 26.0–34.0)
MCHC: 31.7 g/dL (ref 30.0–36.0)
MCV: 89.7 fL (ref 78.0–100.0)
Monocytes Absolute: 0.6 10*3/uL (ref 0.1–1.0)
Monocytes Relative: 15 % — ABNORMAL HIGH (ref 3–12)
Neutro Abs: 1.7 10*3/uL (ref 1.7–7.7)
Neutrophils Relative %: 43 % (ref 43–77)
Platelets: 198 10*3/uL (ref 150–400)
RBC: 4.39 MIL/uL (ref 3.87–5.11)
RDW: 16.8 % — ABNORMAL HIGH (ref 11.5–15.5)
WBC: 3.8 10*3/uL — ABNORMAL LOW (ref 4.0–10.5)

## 2012-04-16 LAB — URINALYSIS, ROUTINE W REFLEX MICROSCOPIC
Glucose, UA: NEGATIVE mg/dL
Hgb urine dipstick: NEGATIVE
Nitrite: NEGATIVE
Protein, ur: 100 mg/dL — AB
Specific Gravity, Urine: 1.024 (ref 1.005–1.030)
Urobilinogen, UA: 0.2 mg/dL (ref 0.0–1.0)
pH: 5.5 (ref 5.0–8.0)

## 2012-04-16 LAB — URINE MICROSCOPIC-ADD ON

## 2012-04-16 MED ORDER — ONDANSETRON HCL 4 MG/2ML IJ SOLN
4.0000 mg | Freq: Once | INTRAMUSCULAR | Status: AC
  Administered 2012-04-16: 4 mg via INTRAVENOUS
  Filled 2012-04-16: qty 2

## 2012-04-16 MED ORDER — SODIUM CHLORIDE 0.9 % IV BOLUS (SEPSIS)
1000.0000 mL | Freq: Once | INTRAVENOUS | Status: AC
  Administered 2012-04-16: 1000 mL via INTRAVENOUS

## 2012-04-16 NOTE — ED Provider Notes (Signed)
History     CSN: [id number]  Arrival date & time 04/16/12  1228   First MD Initiated Contact with Patient 04/16/12 1239      Chief Complaint  Patient presents with  . Abdominal Pain  . Nausea  . Dizziness    (Consider location/radiation/quality/duration/timing/severity/associated sxs/prior treatment) HPI Comments: Patient presents with right-sided abdominal pain for the past week.  She has been seen at [facility] twice for the same and had labs, ct scan.  These showed no definite cause, but labs revealed cri and mild elevation of the lfts.  She has had her gallbladder removed in the past.    Patient is a 47 y.o. female presenting with abdominal pain. The history is provided by the patient.  Abdominal Pain The primary symptoms of the illness include abdominal pain, nausea and vomiting. The primary symptoms of the illness do not include fever, diarrhea or dysuria.    Past Medical History  Diagnosis Date  . IUD     HISTORY OF IUD --REMOVED IN 2006  . Hypertension   . Anemia   . History of blood transfusion   . Anxiety   . Bipolar disorder   . Depression   . Sleep apnea 2000    resolved after gastric bypass  . Seizures 2009,2012    two seizures due to a med changes    Past Surgical History  Procedure Date  . Rotator cuff repair   . Gastric bypass 2000  . Hysteroscopy w/d&c 11/14/2011    Procedure: DILATATION AND CURETTAGE /HYSTEROSCOPY;  Surgeon: Marylynn Pearson, MD;  Location: [facility];  Service: Gynecology;;  with removal of polyps    Family History  Problem Relation Age of Onset  . Hypertension Mother   . Hypertension Father   . Diabetes Sister   . Hypertension Sister   . Hypertension Brother   . Hypertension Maternal Aunt   . Hypertension Maternal Uncle   . Hypertension Maternal Grandmother   . Hypertension Maternal Grandfather   . Hypertension Paternal Grandmother   . Hypertension Paternal Grandfather   . Diabetes Cousin     History  Substance Use Topics  .  Smoking status: Former Smoker -- 0.0 packs/day    Types: Cigarettes    Quit date: 03/21/1988  . Smokeless tobacco: Never Used  . Alcohol Use: Yes     Comment: social    OB History    Grav Para Term Preterm Abortions TAB SAB Ect Mult Living   1    1  1    0      Review of Systems  Constitutional: Negative for fever.  Gastrointestinal: Positive for nausea, vomiting and abdominal pain. Negative for diarrhea.  Genitourinary: Negative for dysuria.  All other systems reviewed and are negative.    Allergies  Sulfa antibiotics  Home Medications   Current Outpatient Rx  Name  Route  Sig  Dispense  Refill  . AMLODIPINE BESYLATE 5 MG PO TABS   Oral   Take 5 mg by mouth daily.         . CELECOXIB 200 MG PO CAPS   Oral   Take 200 mg by mouth daily.         . CEPHALEXIN 500 MG PO CAPS   Oral   Take 1 capsule (500 mg total) by mouth 3 (three) times daily.   40 capsule   0   . CLONIDINE HCL 0.1 MG PO TABS   Oral   Take 0.1 mg by mouth 2 (two)  times daily.         . CYCLOBENZAPRINE HCL 10 MG PO TABS   Oral   Take 10 mg by mouth 3 (three) times daily as needed. For shoulder pain         . DIAZEPAM 5 MG PO TABS   Oral   Take 5-10 mg by mouth every 12 (twelve) hours as needed. For anxiety         . FUROSEMIDE 20 MG PO TABS   Oral   Take 20 mg by mouth daily.         Marland Kitchen HYDROCODONE-ACETAMINOPHEN 5-325 MG PO TABS   Oral   Take 2 tablets by mouth every 6 (six) hours as needed for pain.   15 tablet   0   . LAMOTRIGINE 150 MG PO TABS   Oral   Take 300 mg by mouth daily.         Marland Kitchen LIDOCAINE 5 % EX PTCH   Transdermal   Place 1 patch onto the skin daily as needed. For shoulder pain Remove & Discard patch within 12 hours or as directed by MD         . ONDANSETRON 8 MG PO TBDP   Oral   Take 1 tablet (8 mg total) by mouth every 8 (eight) hours as needed for nausea.   20 tablet   0   . OVER THE COUNTER MEDICATION   Oral   Take 1 tablet by mouth daily.  Caralluma         . TOPIRAMATE 50 MG PO TABS   Oral   Take 200 mg by mouth at bedtime.           BP 150/75  Pulse 88  Temp 99.4 F (37.4 C) (Oral)  Resp 20  SpO2 99%  Physical Exam  Nursing note and vitals reviewed. Constitutional: She is oriented to person, place, and time. She appears well-developed and well-nourished. No distress.  HENT:  Head: Normocephalic and atraumatic.  Mouth/Throat: Oropharynx is clear and moist.  Neck: Normal range of motion. Neck supple.  Cardiovascular: Normal rate and regular rhythm.   No murmur heard. Pulmonary/Chest: Effort normal and breath sounds normal. No respiratory distress.  Abdominal: Soft. She exhibits no distension.       There is ttp in the right mid-abdomen without rebound or guarding.  Bowel sounds are present.    Musculoskeletal: Normal range of motion. She exhibits no edema.  Neurological: She is alert and oriented to person, place, and time.  Skin: Skin is warm and dry. She is not diaphoretic.    ED Course  Procedures (including critical care time)   Labs Reviewed  CBC WITH DIFFERENTIAL  COMPREHENSIVE METABOLIC PANEL  URINALYSIS, ROUTINE W REFLEX MICROSCOPIC   No results found.   No diagnosis found.    MDM  I am unable to find a cause for the patient's symptoms.  She appears well, the abd exam is benign, and the workup is again unremarkable.  I have recommended that she follow up with GI if not improving.  She has had an endoscopy in the past by Dr. Benson Norway.  She is to call and arrange follow up with him.        Veryl Speak, MD 04/16/12 1515

## 2012-04-16 NOTE — ED Notes (Signed)
Pt just keep stating "I just don't feel good".

## 2012-04-16 NOTE — ED Notes (Signed)
Pt c/o abd pain, nausea and dizziness x's 1 week. States she was seen and evaluated at [facility] for same recently and was told "it could be my liver infected or inflamed." also states "sometimes I feel as if I had a good bowel movement I would feel better."

## 2012-04-17 LAB — URINE CULTURE
Colony Count: NO GROWTH
Culture: NO GROWTH

## 2012-05-01 ENCOUNTER — Encounter (HOSPITAL_COMMUNITY): Payer: Self-pay

## 2012-05-08 ENCOUNTER — Inpatient Hospital Stay (HOSPITAL_COMMUNITY): Admission: RE | Admit: 2012-05-08 | Payer: Medicare Other | Source: Ambulatory Visit

## 2012-05-08 ENCOUNTER — Encounter (HOSPITAL_COMMUNITY): Payer: Self-pay | Admitting: Anesthesiology

## 2012-05-08 DIAGNOSIS — I10 Essential (primary) hypertension: Secondary | ICD-10-CM | POA: Diagnosis not present

## 2012-05-08 DIAGNOSIS — N920 Excessive and frequent menstruation with regular cycle: Secondary | ICD-10-CM | POA: Diagnosis not present

## 2012-05-08 DIAGNOSIS — N921 Excessive and frequent menstruation with irregular cycle: Secondary | ICD-10-CM | POA: Diagnosis not present

## 2012-05-15 ENCOUNTER — Encounter (HOSPITAL_COMMUNITY): Admission: RE | Payer: Self-pay | Source: Ambulatory Visit

## 2012-05-15 ENCOUNTER — Inpatient Hospital Stay (HOSPITAL_COMMUNITY)
Admission: RE | Admit: 2012-05-15 | Payer: Medicare Other | Source: Ambulatory Visit | Admitting: Obstetrics and Gynecology

## 2012-05-15 SURGERY — HYSTERECTOMY, VAGINAL, LAPAROSCOPY-ASSISTED
Anesthesia: General

## 2012-05-28 DIAGNOSIS — M25519 Pain in unspecified shoulder: Secondary | ICD-10-CM | POA: Diagnosis not present

## 2012-05-28 DIAGNOSIS — I10 Essential (primary) hypertension: Secondary | ICD-10-CM | POA: Diagnosis not present

## 2012-06-29 DIAGNOSIS — I120 Hypertensive chronic kidney disease with stage 5 chronic kidney disease or end stage renal disease: Secondary | ICD-10-CM | POA: Diagnosis not present

## 2012-06-29 DIAGNOSIS — R9431 Abnormal electrocardiogram [ECG] [EKG]: Secondary | ICD-10-CM | POA: Diagnosis not present

## 2012-06-29 DIAGNOSIS — R7309 Other abnormal glucose: Secondary | ICD-10-CM | POA: Diagnosis not present

## 2012-07-20 DIAGNOSIS — M25519 Pain in unspecified shoulder: Secondary | ICD-10-CM | POA: Diagnosis not present

## 2012-07-20 DIAGNOSIS — I10 Essential (primary) hypertension: Secondary | ICD-10-CM | POA: Diagnosis not present

## 2012-07-23 DIAGNOSIS — R9431 Abnormal electrocardiogram [ECG] [EKG]: Secondary | ICD-10-CM | POA: Diagnosis not present

## 2012-07-23 DIAGNOSIS — I10 Essential (primary) hypertension: Secondary | ICD-10-CM | POA: Diagnosis not present

## 2012-08-02 DIAGNOSIS — E785 Hyperlipidemia, unspecified: Secondary | ICD-10-CM | POA: Diagnosis not present

## 2012-08-02 DIAGNOSIS — R9431 Abnormal electrocardiogram [ECG] [EKG]: Secondary | ICD-10-CM | POA: Diagnosis not present

## 2012-08-02 DIAGNOSIS — R7309 Other abnormal glucose: Secondary | ICD-10-CM | POA: Diagnosis not present

## 2012-08-02 DIAGNOSIS — I120 Hypertensive chronic kidney disease with stage 5 chronic kidney disease or end stage renal disease: Secondary | ICD-10-CM | POA: Diagnosis not present

## 2012-08-17 DIAGNOSIS — M999 Biomechanical lesion, unspecified: Secondary | ICD-10-CM | POA: Diagnosis not present

## 2012-08-17 DIAGNOSIS — R945 Abnormal results of liver function studies: Secondary | ICD-10-CM | POA: Diagnosis not present

## 2012-08-17 DIAGNOSIS — E669 Obesity, unspecified: Secondary | ICD-10-CM | POA: Diagnosis not present

## 2012-08-17 DIAGNOSIS — M5137 Other intervertebral disc degeneration, lumbosacral region: Secondary | ICD-10-CM | POA: Diagnosis not present

## 2012-08-17 DIAGNOSIS — I129 Hypertensive chronic kidney disease with stage 1 through stage 4 chronic kidney disease, or unspecified chronic kidney disease: Secondary | ICD-10-CM | POA: Diagnosis not present

## 2012-08-17 DIAGNOSIS — Z79899 Other long term (current) drug therapy: Secondary | ICD-10-CM | POA: Diagnosis not present

## 2012-08-17 DIAGNOSIS — E559 Vitamin D deficiency, unspecified: Secondary | ICD-10-CM | POA: Diagnosis not present

## 2012-08-17 DIAGNOSIS — N183 Chronic kidney disease, stage 3 unspecified: Secondary | ICD-10-CM | POA: Diagnosis not present

## 2012-09-04 ENCOUNTER — Other Ambulatory Visit: Payer: Self-pay | Admitting: Internal Medicine

## 2012-09-04 DIAGNOSIS — R7989 Other specified abnormal findings of blood chemistry: Secondary | ICD-10-CM

## 2012-09-04 DIAGNOSIS — R109 Unspecified abdominal pain: Secondary | ICD-10-CM

## 2012-09-14 ENCOUNTER — Ambulatory Visit
Admission: RE | Admit: 2012-09-14 | Discharge: 2012-09-14 | Disposition: A | Discharge status: Home / Self Care | Payer: Medicare Other | Source: Ambulatory Visit | Attending: Internal Medicine | Admitting: Internal Medicine

## 2012-09-14 DIAGNOSIS — E785 Hyperlipidemia, unspecified: Secondary | ICD-10-CM | POA: Diagnosis not present

## 2012-09-14 DIAGNOSIS — R109 Unspecified abdominal pain: Secondary | ICD-10-CM

## 2012-09-14 DIAGNOSIS — M25519 Pain in unspecified shoulder: Secondary | ICD-10-CM | POA: Diagnosis not present

## 2012-09-14 DIAGNOSIS — R7309 Other abnormal glucose: Secondary | ICD-10-CM | POA: Diagnosis not present

## 2012-09-14 DIAGNOSIS — I10 Essential (primary) hypertension: Secondary | ICD-10-CM | POA: Diagnosis not present

## 2012-09-14 DIAGNOSIS — R7989 Other specified abnormal findings of blood chemistry: Secondary | ICD-10-CM | POA: Diagnosis not present

## 2012-09-14 DIAGNOSIS — I120 Hypertensive chronic kidney disease with stage 5 chronic kidney disease or end stage renal disease: Secondary | ICD-10-CM | POA: Diagnosis not present

## 2012-10-12 ENCOUNTER — Ambulatory Visit (HOSPITAL_COMMUNITY): Payer: Medicare Other | Admitting: Psychiatry

## 2012-10-12 DIAGNOSIS — I10 Essential (primary) hypertension: Secondary | ICD-10-CM | POA: Diagnosis not present

## 2012-10-12 DIAGNOSIS — M25519 Pain in unspecified shoulder: Secondary | ICD-10-CM | POA: Diagnosis not present

## 2012-11-09 DIAGNOSIS — L259 Unspecified contact dermatitis, unspecified cause: Secondary | ICD-10-CM | POA: Diagnosis not present

## 2012-11-09 DIAGNOSIS — L301 Dyshidrosis [pompholyx]: Secondary | ICD-10-CM | POA: Diagnosis not present

## 2012-11-09 DIAGNOSIS — L659 Nonscarring hair loss, unspecified: Secondary | ICD-10-CM | POA: Diagnosis not present

## 2012-12-01 ENCOUNTER — Emergency Department (HOSPITAL_COMMUNITY)
Admission: EM | Admit: 2012-12-01 | Discharge: 2012-12-01 | Disposition: A | Discharge status: Home / Self Care | Payer: Medicare Other | Attending: Emergency Medicine | Admitting: Emergency Medicine

## 2012-12-01 ENCOUNTER — Encounter (HOSPITAL_COMMUNITY): Payer: Self-pay | Admitting: Emergency Medicine

## 2012-12-01 DIAGNOSIS — Z8669 Personal history of other diseases of the nervous system and sense organs: Secondary | ICD-10-CM | POA: Diagnosis not present

## 2012-12-01 DIAGNOSIS — Z79899 Other long term (current) drug therapy: Secondary | ICD-10-CM | POA: Diagnosis not present

## 2012-12-01 DIAGNOSIS — I10 Essential (primary) hypertension: Secondary | ICD-10-CM | POA: Insufficient documentation

## 2012-12-01 DIAGNOSIS — Z3202 Encounter for pregnancy test, result negative: Secondary | ICD-10-CM | POA: Insufficient documentation

## 2012-12-01 DIAGNOSIS — Z87891 Personal history of nicotine dependence: Secondary | ICD-10-CM | POA: Insufficient documentation

## 2012-12-01 DIAGNOSIS — Z8659 Personal history of other mental and behavioral disorders: Secondary | ICD-10-CM | POA: Insufficient documentation

## 2012-12-01 DIAGNOSIS — R112 Nausea with vomiting, unspecified: Secondary | ICD-10-CM | POA: Insufficient documentation

## 2012-12-01 DIAGNOSIS — R1013 Epigastric pain: Secondary | ICD-10-CM | POA: Diagnosis not present

## 2012-12-01 DIAGNOSIS — Z862 Personal history of diseases of the blood and blood-forming organs and certain disorders involving the immune mechanism: Secondary | ICD-10-CM | POA: Diagnosis not present

## 2012-12-01 DIAGNOSIS — K2970 Gastritis, unspecified, without bleeding: Secondary | ICD-10-CM | POA: Insufficient documentation

## 2012-12-01 DIAGNOSIS — K2900 Acute gastritis without bleeding: Secondary | ICD-10-CM | POA: Diagnosis not present

## 2012-12-01 LAB — URINALYSIS, ROUTINE W REFLEX MICROSCOPIC
Bilirubin Urine: NEGATIVE
Glucose, UA: NEGATIVE mg/dL
Ketones, ur: NEGATIVE mg/dL
Leukocytes, UA: NEGATIVE
Nitrite: NEGATIVE
Protein, ur: 100 mg/dL — AB
Specific Gravity, Urine: 1.016 (ref 1.005–1.030)
Urobilinogen, UA: 0.2 mg/dL (ref 0.0–1.0)
pH: 6 (ref 5.0–8.0)

## 2012-12-01 LAB — URINE MICROSCOPIC-ADD ON

## 2012-12-01 LAB — CBC WITH DIFFERENTIAL/PLATELET
Basophils Absolute: 0 10*3/uL (ref 0.0–0.1)
Basophils Relative: 0 % (ref 0–1)
Eosinophils Absolute: 0.2 10*3/uL (ref 0.0–0.7)
Eosinophils Relative: 2 % (ref 0–5)
HCT: 41.2 % (ref 36.0–46.0)
Hemoglobin: 13.3 g/dL (ref 12.0–15.0)
Lymphocytes Relative: 16 % (ref 12–46)
Lymphs Abs: 1.6 10*3/uL (ref 0.7–4.0)
MCH: 27.8 pg (ref 26.0–34.0)
MCHC: 32.3 g/dL (ref 30.0–36.0)
MCV: 86.2 fL (ref 78.0–100.0)
Monocytes Absolute: 0.7 10*3/uL (ref 0.1–1.0)
Monocytes Relative: 7 % (ref 3–12)
Neutro Abs: 7.7 10*3/uL (ref 1.7–7.7)
Neutrophils Relative %: 75 % (ref 43–77)
Platelets: 225 10*3/uL (ref 150–400)
RBC: 4.78 MIL/uL (ref 3.87–5.11)
RDW: 14.3 % (ref 11.5–15.5)
WBC: 10.3 10*3/uL (ref 4.0–10.5)

## 2012-12-01 LAB — POCT I-STAT, CHEM 8
BUN: 16 mg/dL (ref 6–23)
Calcium, Ion: 1.19 mmol/L (ref 1.12–1.23)
Chloride: 103 mEq/L (ref 96–112)
Creatinine, Ser: 1.6 mg/dL — ABNORMAL HIGH (ref 0.50–1.10)
Glucose, Bld: 108 mg/dL — ABNORMAL HIGH (ref 70–99)
HCT: 44 % (ref 36.0–46.0)
Hemoglobin: 15 g/dL (ref 12.0–15.0)
Potassium: 4 mEq/L (ref 3.5–5.1)
Sodium: 139 mEq/L (ref 135–145)
TCO2: 24 mmol/L (ref 0–100)

## 2012-12-01 LAB — HEPATIC FUNCTION PANEL
ALT: 9 U/L (ref 0–35)
AST: 16 U/L (ref 0–37)
Albumin: 3.7 g/dL (ref 3.5–5.2)
Alkaline Phosphatase: 113 U/L (ref 39–117)
Bilirubin, Direct: 0.2 mg/dL (ref 0.0–0.3)
Indirect Bilirubin: 0.9 mg/dL (ref 0.3–0.9)
Total Bilirubin: 1.1 mg/dL (ref 0.3–1.2)
Total Protein: 6.8 g/dL (ref 6.0–8.3)

## 2012-12-01 LAB — POCT PREGNANCY, URINE: Preg Test, Ur: NEGATIVE

## 2012-12-01 LAB — LIPASE, BLOOD: Lipase: 55 U/L (ref 11–59)

## 2012-12-01 MED ORDER — ONDANSETRON 4 MG PO TBDP
ORAL_TABLET | ORAL | Status: AC
  Filled 2012-12-01: qty 2

## 2012-12-01 MED ORDER — PANTOPRAZOLE SODIUM 40 MG IV SOLR: 40.0000 mg | Freq: Once | INTRAVENOUS | Status: DC

## 2012-12-01 MED ORDER — ESOMEPRAZOLE MAGNESIUM 40 MG PO CPDR: 40.0000 mg | DELAYED_RELEASE_CAPSULE | Freq: Every day | ORAL | Status: DC

## 2012-12-01 MED ORDER — MORPHINE SULFATE 4 MG/ML IJ SOLN
4.0000 mg | Freq: Once | INTRAMUSCULAR | Status: AC
  Administered 2012-12-01: 4 mg via INTRAVENOUS

## 2012-12-01 MED ORDER — ONDANSETRON 4 MG PO TBDP: 8.0000 mg | ORAL_TABLET | ORAL | Status: DC

## 2012-12-01 MED ORDER — PANTOPRAZOLE SODIUM 40 MG IV SOLR
40.0000 mg | Freq: Once | INTRAVENOUS | Status: AC
  Administered 2012-12-01: 40 mg via INTRAVENOUS
  Filled 2012-12-01: qty 40

## 2012-12-01 MED ORDER — SODIUM CHLORIDE 0.9 % IV BOLUS (SEPSIS)
1000.0000 mL | Freq: Once | INTRAVENOUS | Status: AC
  Administered 2012-12-01: 1000 mL via INTRAVENOUS

## 2012-12-01 MED ORDER — ONDANSETRON 4 MG PO TBDP
8.0000 mg | ORAL_TABLET | ORAL | Status: AC
  Administered 2012-12-01: 8 mg via ORAL

## 2012-12-01 NOTE — ED Notes (Signed)
Patient with nausea and dry heaves since yesterday afternoon.  Patient took her nausea med at home which has slowed the dry heaves but she continues with the nausea, aches all in her back and legs.

## 2012-12-01 NOTE — ED Provider Notes (Addendum)
CSN: [id number]     Arrival date & time 12/01/12  0134 History   First MD Initiated Contact with Patient 12/01/12 [phone]     Chief Complaint  Patient presents with  . Nausea   (Consider location/radiation/quality/duration/timing/severity/associated sxs/prior Treatment) HPI  Patient is a 47 yo woman with a remote history of gastric bypass in 2000 by Dr. Sherryle Lis at [facility] in [place], [place].   She presents with approximately 24 hrs of nausea and and NBNB emesis x 3. She has diffuse epigastric pain which is cramping, aching, moderately severe and waxing and waning in severity. She has had numerous episodes of dry heaves since then. She has myalgias and general malaise. No diarrhea. LBM was yesterday and was softer than usual and foul smelling. She denies GU sx. She is noted to have a temp of 100.59F in ED.   The patient denies any complications of gastric bypass. In addition to this procedure, she is s/p cholecystectomy remotely.   The patient notes that she had a normal screening colonoscopy in May 2014.   Patient says she was prescribed Naproxen 500mg  bid about 1 month ago by her orthopedist. However, she has been taking this medication only once per day. She is not prescribed a PPI or H2 blocker. Denies history of PUD. No history of pancreatitis or gastritis.   Past Medical History  Diagnosis Date  . IUD     HISTORY OF IUD --REMOVED IN 2006  . Hypertension   . Anemia   . History of blood transfusion   . Anxiety   . Bipolar disorder   . Depression   . Sleep apnea 2000    resolved after gastric bypass  . Seizures 2009,2012    two seizures due to a med changes   Past Surgical History  Procedure Laterality Date  . Rotator cuff repair    . Gastric bypass  2000  . Hysteroscopy w/d&c  11/14/2011    Procedure: DILATATION AND CURETTAGE /HYSTEROSCOPY;  Surgeon: Marylynn Pearson, MD;  Location: [facility];  Service: Gynecology;;  with removal of polyps  . Cholecystectomy     Family  History  Problem Relation Age of Onset  . Hypertension Mother   . Hypertension Father   . Diabetes Sister   . Hypertension Sister   . Hypertension Brother   . Hypertension Maternal Aunt   . Hypertension Maternal Uncle   . Hypertension Maternal Grandmother   . Hypertension Maternal Grandfather   . Hypertension Paternal Grandmother   . Hypertension Paternal Grandfather   . Diabetes Cousin    History  Substance Use Topics  . Smoking status: Former Smoker -- 0.00 packs/day    Types: Cigarettes    Quit date: 03/21/1988  . Smokeless tobacco: Never Used  . Alcohol Use: Yes     Comment: social   OB History   Grav Para Term Preterm Abortions TAB SAB Ect Mult Living   1    1  1    0     Review of Systems 10 point ROS obtained and is negative with the exception of symptoms noted above   Allergies  Sulfa antibiotics  Home Medications   Current Outpatient Rx  Name  Route  Sig  Dispense  Refill  . amLODipine (NORVASC) 5 MG tablet   Oral   Take 5 mg by mouth every evening.          . carvedilol (COREG) 12.5 MG tablet   Oral   Take 12.5 mg by mouth 2 (  two) times daily with a meal.         . furosemide (LASIX) 20 MG tablet   Oral   Take 20 mg by mouth every evening.          . lidocaine (LIDODERM) 5 %   Transdermal   Place 1 patch onto the skin daily as needed. For shoulder pain Remove & Discard patch within 12 hours or as directed by MD         . losartan (COZAAR) 25 MG tablet   Oral   Take 25 mg by mouth daily.         . naproxen (NAPROSYN) 500 MG tablet   Oral   Take 500 mg by mouth 2 (two) times daily with a meal.         . tiZANidine (ZANAFLEX) 4 MG tablet   Oral   Take 4 mg by mouth every 8 (eight) hours as needed (muscle spasm).         . traMADol (ULTRAM) 50 MG tablet   Oral   Take 50 mg by mouth every 4 (four) hours as needed for pain.          BP 150/90  Pulse 73  Temp(Src) 100.1 F (37.8 C) (Oral)  Resp 14  SpO2 99% Physical  Exam Gen: well nourished, non-toxic appearing, well devleloped Head: NCAT Eyes: PERL Mouth: oral mucosa well hydrated appearing Neck: supple Resp: RR 20/min, CTA bilat CV: RRR, no murmur, ext well perfused Abd: soft, nontender, nondistended, + BS Pelvis: nontender Skin: no lesions Ext: no edema Neuro: no focal deficits, cn ii-xii grossly intact, GCS 15  ED Course  Procedures (including critical care time)  Results for orders placed during the hospital encounter of 12/01/12 (from the past 24 hour(s))  CBC WITH DIFFERENTIAL     Status: None   Collection Time    12/01/12  1:55 AM      Result Value Range   WBC 10.3  4.0 - 10.5 K/uL   RBC 4.78  3.87 - 5.11 MIL/uL   Hemoglobin 13.3  12.0 - 15.0 g/dL   HCT 41.2  36.0 - 46.0 %   MCV 86.2  78.0 - 100.0 fL   MCH 27.8  26.0 - 34.0 pg   MCHC 32.3  30.0 - 36.0 g/dL   RDW 14.3  11.5 - 15.5 %   Platelets 225  150 - 400 K/uL   Neutrophils Relative % 75  43 - 77 %   Neutro Abs 7.7  1.7 - 7.7 K/uL   Lymphocytes Relative 16  12 - 46 %   Lymphs Abs 1.6  0.7 - 4.0 K/uL   Monocytes Relative 7  3 - 12 %   Monocytes Absolute 0.7  0.1 - 1.0 K/uL   Eosinophils Relative 2  0 - 5 %   Eosinophils Absolute 0.2  0.0 - 0.7 K/uL   Basophils Relative 0  0 - 1 %   Basophils Absolute 0.0  0.0 - 0.1 K/uL  POCT I-STAT, CHEM 8     Status: Abnormal   Collection Time    12/01/12  2:08 AM      Result Value Range   Sodium 139  135 - 145 mEq/L   Potassium 4.0  3.5 - 5.1 mEq/L   Chloride 103  96 - 112 mEq/L   BUN 16  6 - 23 mg/dL   Creatinine, Ser 1.60 (*) 0.50 - 1.10 mg/dL   Glucose, Bld 108 (*) 70 -  99 mg/dL   Calcium, Ion 1.19  1.12 - 1.23 mmol/L   TCO2 24  0 - 100 mmol/L   Hemoglobin 15.0  12.0 - 15.0 g/dL   HCT 44.0  36.0 - 46.0 %  POCT PREGNANCY, URINE     Status: None   Collection Time    12/01/12  2:11 AM      Result Value Range   Preg Test, Ur NEGATIVE  NEGATIVE  URINALYSIS, ROUTINE W REFLEX MICROSCOPIC     Status: Abnormal   Collection Time     12/01/12  2:44 AM      Result Value Range   Color, Urine YELLOW  YELLOW   APPearance CLOUDY (*) CLEAR   Specific Gravity, Urine 1.016  1.005 - 1.030   pH 6.0  5.0 - 8.0   Glucose, UA NEGATIVE  NEGATIVE mg/dL   Hgb urine dipstick LARGE (*) NEGATIVE   Bilirubin Urine NEGATIVE  NEGATIVE   Ketones, ur NEGATIVE  NEGATIVE mg/dL   Protein, ur 100 (*) NEGATIVE mg/dL   Urobilinogen, UA 0.2  0.0 - 1.0 mg/dL   Nitrite NEGATIVE  NEGATIVE   Leukocytes, UA NEGATIVE  NEGATIVE  URINE MICROSCOPIC-ADD ON     Status: None   Collection Time    12/01/12  2:44 AM      Result Value Range   Squamous Epithelial / LPF RARE  RARE   WBC, UA 0-2  <3 WBC/hpf   RBC / HPF 3-6  <3 RBC/hpf     MDM      Elyn Peers, MD 12/01/12 [id number]  Elyn Peers, MD 12/21/12 [phone]

## 2012-12-01 NOTE — ED Provider Notes (Signed)
Results for orders placed during the hospital encounter of 12/01/12  CBC WITH DIFFERENTIAL      Result Value Range   WBC 10.3  4.0 - 10.5 K/uL   RBC 4.78  3.87 - 5.11 MIL/uL   Hemoglobin 13.3  12.0 - 15.0 g/dL   HCT 41.2  36.0 - 46.0 %   MCV 86.2  78.0 - 100.0 fL   MCH 27.8  26.0 - 34.0 pg   MCHC 32.3  30.0 - 36.0 g/dL   RDW 14.3  11.5 - 15.5 %   Platelets 225  150 - 400 K/uL   Neutrophils Relative % 75  43 - 77 %   Neutro Abs 7.7  1.7 - 7.7 K/uL   Lymphocytes Relative 16  12 - 46 %   Lymphs Abs 1.6  0.7 - 4.0 K/uL   Monocytes Relative 7  3 - 12 %   Monocytes Absolute 0.7  0.1 - 1.0 K/uL   Eosinophils Relative 2  0 - 5 %   Eosinophils Absolute 0.2  0.0 - 0.7 K/uL   Basophils Relative 0  0 - 1 %   Basophils Absolute 0.0  0.0 - 0.1 K/uL  URINALYSIS, ROUTINE W REFLEX MICROSCOPIC      Result Value Range   Color, Urine YELLOW  YELLOW   APPearance CLOUDY (*) CLEAR   Specific Gravity, Urine 1.016  1.005 - 1.030   pH 6.0  5.0 - 8.0   Glucose, UA NEGATIVE  NEGATIVE mg/dL   Hgb urine dipstick LARGE (*) NEGATIVE   Bilirubin Urine NEGATIVE  NEGATIVE   Ketones, ur NEGATIVE  NEGATIVE mg/dL   Protein, ur 100 (*) NEGATIVE mg/dL   Urobilinogen, UA 0.2  0.0 - 1.0 mg/dL   Nitrite NEGATIVE  NEGATIVE   Leukocytes, UA NEGATIVE  NEGATIVE  URINE MICROSCOPIC-ADD ON      Result Value Range   Squamous Epithelial / LPF RARE  RARE   WBC, UA 0-2  <3 WBC/hpf   RBC / HPF 3-6  <3 RBC/hpf  LIPASE, BLOOD      Result Value Range   Lipase 55  11 - 59 U/L  HEPATIC FUNCTION PANEL      Result Value Range   Total Protein 6.8  6.0 - 8.3 g/dL   Albumin 3.7  3.5 - 5.2 g/dL   AST 16  0 - 37 U/L   ALT 9  0 - 35 U/L   Alkaline Phosphatase 113  39 - 117 U/L   Total Bilirubin 1.1  0.3 - 1.2 mg/dL   Bilirubin, Direct 0.2  0.0 - 0.3 mg/dL   Indirect Bilirubin 0.9  0.3 - 0.9 mg/dL  POCT PREGNANCY, URINE      Result Value Range   Preg Test, Ur NEGATIVE  NEGATIVE  POCT I-STAT, CHEM 8      Result Value Range   Sodium 139  135 - 145 mEq/L   Potassium 4.0  3.5 - 5.1 mEq/L   Chloride 103  96 - 112 mEq/L   BUN 16  6 - 23 mg/dL   Creatinine, Ser 1.60 (*) 0.50 - 1.10 mg/dL   Glucose, Bld 108 (*) 70 - 99 mg/dL   Calcium, Ion 1.19  1.12 - 1.23 mmol/L   TCO2 24  0 - 100 mmol/L   Hemoglobin 15.0  12.0 - 15.0 g/dL   HCT 44.0  36.0 - 46.0 %   No results found.  Patient presents with epigastric discomfort and nausea. She was given  some medications here in the ED and feels much better. She has no abdominal pain on exam currently. She was given prescriptions per Dr. Lemar Livings of Nexium. I advised her to followup with her primary care physician and if his symptoms continue, she may need referral to GI. Advised her to hold off on the Naprosyn for now. Her creatinine is mildly elevated but it's actually lower than that past values per chart review. I did advise her that her urine had some blood in it that will need followup by her primary care physician to make sure that clears.  Malvin Johns, MD 12/01/12 [phone]

## 2012-12-01 NOTE — ED Notes (Signed)
Patient discharged to home. Patient voiced understanding of discharge instructions. NAD.

## 2012-12-07 DIAGNOSIS — Z79899 Other long term (current) drug therapy: Secondary | ICD-10-CM | POA: Diagnosis not present

## 2012-12-07 DIAGNOSIS — N183 Chronic kidney disease, stage 3 unspecified: Secondary | ICD-10-CM | POA: Diagnosis not present

## 2012-12-07 DIAGNOSIS — I129 Hypertensive chronic kidney disease with stage 1 through stage 4 chronic kidney disease, or unspecified chronic kidney disease: Secondary | ICD-10-CM | POA: Diagnosis not present

## 2012-12-07 DIAGNOSIS — K2970 Gastritis, unspecified, without bleeding: Secondary | ICD-10-CM | POA: Diagnosis not present

## 2013-01-04 DIAGNOSIS — I10 Essential (primary) hypertension: Secondary | ICD-10-CM | POA: Diagnosis not present

## 2013-01-04 DIAGNOSIS — M545 Low back pain, unspecified: Secondary | ICD-10-CM | POA: Diagnosis not present

## 2013-01-22 ENCOUNTER — Emergency Department (HOSPITAL_COMMUNITY): Payer: Medicare Other

## 2013-01-22 ENCOUNTER — Emergency Department (HOSPITAL_COMMUNITY)
Admission: EM | Admit: 2013-01-22 | Discharge: 2013-01-22 | Disposition: A | Discharge status: Home / Self Care | Payer: Medicare Other | Source: Home / Self Care | Attending: Emergency Medicine | Admitting: Emergency Medicine

## 2013-01-22 ENCOUNTER — Encounter (HOSPITAL_COMMUNITY): Payer: Self-pay | Admitting: Emergency Medicine

## 2013-01-22 DIAGNOSIS — Z79899 Other long term (current) drug therapy: Secondary | ICD-10-CM | POA: Insufficient documentation

## 2013-01-22 DIAGNOSIS — K219 Gastro-esophageal reflux disease without esophagitis: Secondary | ICD-10-CM | POA: Diagnosis present

## 2013-01-22 DIAGNOSIS — R109 Unspecified abdominal pain: Secondary | ICD-10-CM | POA: Diagnosis not present

## 2013-01-22 DIAGNOSIS — G8929 Other chronic pain: Secondary | ICD-10-CM | POA: Diagnosis present

## 2013-01-22 DIAGNOSIS — F319 Bipolar disorder, unspecified: Secondary | ICD-10-CM | POA: Diagnosis present

## 2013-01-22 DIAGNOSIS — G40909 Epilepsy, unspecified, not intractable, without status epilepticus: Secondary | ICD-10-CM | POA: Insufficient documentation

## 2013-01-22 DIAGNOSIS — R112 Nausea with vomiting, unspecified: Secondary | ICD-10-CM

## 2013-01-22 DIAGNOSIS — F411 Generalized anxiety disorder: Secondary | ICD-10-CM | POA: Diagnosis present

## 2013-01-22 DIAGNOSIS — N179 Acute kidney failure, unspecified: Secondary | ICD-10-CM | POA: Diagnosis not present

## 2013-01-22 DIAGNOSIS — E860 Dehydration: Secondary | ICD-10-CM | POA: Diagnosis present

## 2013-01-22 DIAGNOSIS — I10 Essential (primary) hypertension: Secondary | ICD-10-CM | POA: Insufficient documentation

## 2013-01-22 DIAGNOSIS — Z9884 Bariatric surgery status: Secondary | ICD-10-CM | POA: Insufficient documentation

## 2013-01-22 DIAGNOSIS — R1115 Cyclical vomiting syndrome unrelated to migraine: Secondary | ICD-10-CM | POA: Diagnosis not present

## 2013-01-22 DIAGNOSIS — K2970 Gastritis, unspecified, without bleeding: Secondary | ICD-10-CM | POA: Diagnosis not present

## 2013-01-22 DIAGNOSIS — E876 Hypokalemia: Secondary | ICD-10-CM | POA: Diagnosis not present

## 2013-01-22 DIAGNOSIS — N200 Calculus of kidney: Secondary | ICD-10-CM | POA: Diagnosis not present

## 2013-01-22 DIAGNOSIS — Z87891 Personal history of nicotine dependence: Secondary | ICD-10-CM | POA: Insufficient documentation

## 2013-01-22 DIAGNOSIS — Z862 Personal history of diseases of the blood and blood-forming organs and certain disorders involving the immune mechanism: Secondary | ICD-10-CM | POA: Insufficient documentation

## 2013-01-22 DIAGNOSIS — E669 Obesity, unspecified: Secondary | ICD-10-CM | POA: Insufficient documentation

## 2013-01-22 DIAGNOSIS — R1031 Right lower quadrant pain: Secondary | ICD-10-CM | POA: Diagnosis not present

## 2013-01-22 DIAGNOSIS — J3489 Other specified disorders of nose and nasal sinuses: Secondary | ICD-10-CM | POA: Insufficient documentation

## 2013-01-22 DIAGNOSIS — R509 Fever, unspecified: Secondary | ICD-10-CM | POA: Insufficient documentation

## 2013-01-22 DIAGNOSIS — Z9189 Other specified personal risk factors, not elsewhere classified: Secondary | ICD-10-CM | POA: Insufficient documentation

## 2013-01-22 DIAGNOSIS — N289 Disorder of kidney and ureter, unspecified: Secondary | ICD-10-CM | POA: Diagnosis not present

## 2013-01-22 DIAGNOSIS — R42 Dizziness and giddiness: Secondary | ICD-10-CM | POA: Diagnosis not present

## 2013-01-22 DIAGNOSIS — R569 Unspecified convulsions: Secondary | ICD-10-CM | POA: Diagnosis not present

## 2013-01-22 DIAGNOSIS — R1011 Right upper quadrant pain: Secondary | ICD-10-CM | POA: Diagnosis not present

## 2013-01-22 DIAGNOSIS — A088 Other specified intestinal infections: Secondary | ICD-10-CM | POA: Diagnosis not present

## 2013-01-22 LAB — LIPASE, BLOOD: Lipase: 67 U/L — ABNORMAL HIGH (ref 11–59)

## 2013-01-22 LAB — CBC WITH DIFFERENTIAL/PLATELET
Basophils Absolute: 0 10*3/uL (ref 0.0–0.1)
Basophils Relative: 0 % (ref 0–1)
Eosinophils Absolute: 0 10*3/uL (ref 0.0–0.7)
Eosinophils Relative: 0 % (ref 0–5)
HCT: 40.2 % (ref 36.0–46.0)
Hemoglobin: 13.7 g/dL (ref 12.0–15.0)
Lymphocytes Relative: 15 % (ref 12–46)
Lymphs Abs: 1.4 10*3/uL (ref 0.7–4.0)
MCH: 27.8 pg (ref 26.0–34.0)
MCHC: 34.1 g/dL (ref 30.0–36.0)
MCV: 81.7 fL (ref 78.0–100.0)
Monocytes Absolute: 0.4 10*3/uL (ref 0.1–1.0)
Monocytes Relative: 4 % (ref 3–12)
Neutro Abs: 7.8 10*3/uL — ABNORMAL HIGH (ref 1.7–7.7)
Neutrophils Relative %: 81 % — ABNORMAL HIGH (ref 43–77)
Platelets: 199 10*3/uL (ref 150–400)
RBC: 4.92 MIL/uL (ref 3.87–5.11)
RDW: 14.6 % (ref 11.5–15.5)
WBC: 9.6 10*3/uL (ref 4.0–10.5)

## 2013-01-22 LAB — COMPREHENSIVE METABOLIC PANEL
ALT: 20 U/L (ref 0–35)
AST: 23 U/L (ref 0–37)
Albumin: 4.2 g/dL (ref 3.5–5.2)
Alkaline Phosphatase: 145 U/L — ABNORMAL HIGH (ref 39–117)
BUN: 14 mg/dL (ref 6–23)
CO2: 25 mEq/L (ref 19–32)
Calcium: 9.3 mg/dL (ref 8.4–10.5)
Chloride: 101 mEq/L (ref 96–112)
Creatinine, Ser: 1.33 mg/dL — ABNORMAL HIGH (ref 0.50–1.10)
GFR calc Af Amer: 55 mL/min — ABNORMAL LOW (ref >90)
GFR calc non Af Amer: 47 mL/min — ABNORMAL LOW (ref >90)
Glucose, Bld: 122 mg/dL — ABNORMAL HIGH (ref 70–99)
Potassium: 3.6 mEq/L (ref 3.5–5.1)
Sodium: 137 mEq/L (ref 135–145)
Total Bilirubin: 1 mg/dL (ref 0.3–1.2)
Total Protein: 8 g/dL (ref 6.0–8.3)

## 2013-01-22 LAB — URINALYSIS, ROUTINE W REFLEX MICROSCOPIC
Bilirubin Urine: NEGATIVE
Glucose, UA: NEGATIVE mg/dL
Ketones, ur: NEGATIVE mg/dL
Leukocytes, UA: NEGATIVE
Nitrite: NEGATIVE
Protein, ur: >300 mg/dL — AB
Specific Gravity, Urine: 1.019 (ref 1.005–1.030)
Urobilinogen, UA: 0.2 mg/dL (ref 0.0–1.0)
pH: 6.5 (ref 5.0–8.0)

## 2013-01-22 LAB — URINE MICROSCOPIC-ADD ON

## 2013-01-22 MED ORDER — ONDANSETRON 8 MG PO TBDP: 8.0000 mg | ORAL_TABLET | Freq: Three times a day (TID) | ORAL | Status: DC | PRN

## 2013-01-22 MED ORDER — MORPHINE SULFATE 4 MG/ML IJ SOLN
4.0000 mg | Freq: Once | INTRAMUSCULAR | Status: AC
  Administered 2013-01-22: 4 mg via INTRAVENOUS
  Filled 2013-01-22: qty 1

## 2013-01-22 MED ORDER — ONDANSETRON HCL 4 MG/2ML IJ SOLN
4.0000 mg | Freq: Once | INTRAMUSCULAR | Status: AC
  Administered 2013-01-22: 4 mg via INTRAVENOUS
  Filled 2013-01-22: qty 2

## 2013-01-22 MED ORDER — PROMETHAZINE HCL 25 MG RE SUPP: 25.0000 mg | Freq: Four times a day (QID) | RECTAL | Status: DC | PRN

## 2013-01-22 MED ORDER — PROMETHAZINE HCL 25 MG PO TABS: 25.0000 mg | ORAL_TABLET | Freq: Four times a day (QID) | ORAL | Status: DC | PRN

## 2013-01-22 MED ORDER — DICYCLOMINE HCL 20 MG PO TABS: 20.0000 mg | ORAL_TABLET | Freq: Four times a day (QID) | ORAL | Status: DC | PRN

## 2013-01-22 MED ORDER — HYDROMORPHONE HCL PF 1 MG/ML IJ SOLN
1.0000 mg | Freq: Once | INTRAMUSCULAR | Status: AC
  Administered 2013-01-22: 1 mg via INTRAVENOUS
  Filled 2013-01-22: qty 1

## 2013-01-22 MED ORDER — SODIUM CHLORIDE 0.9 % IV BOLUS (SEPSIS)
1000.0000 mL | Freq: Once | INTRAVENOUS | Status: AC
  Administered 2013-01-22: 1000 mL via INTRAVENOUS

## 2013-01-22 MED ORDER — IOHEXOL 300 MG/ML  SOLN
100.0000 mL | Freq: Once | INTRAMUSCULAR | Status: AC | PRN
  Administered 2013-01-22: 100 mL via INTRAVENOUS

## 2013-01-22 MED ORDER — IOHEXOL 300 MG/ML  SOLN
20.0000 mL | INTRAMUSCULAR | Status: AC
  Administered 2013-01-22: 25 mL via ORAL

## 2013-01-22 NOTE — ED Notes (Signed)
Pt. Vomited moderate amount of her contrast in emesis bag.

## 2013-01-22 NOTE — ED Notes (Signed)
Pt here with hypertension, nausea, vomiting.  Pt also reporting some dizziness.  Pt has hx of hypertension.  Pt sts she has had more than 10 episodes of emesis throughout the day and has generalized abdominal pain. Tenderness to RLQ.  Nausea started yesterday; abdominal pain and vomiting started this morning. Pt warm to touch; febrile on arrival.

## 2013-01-22 NOTE — ED Provider Notes (Signed)
CSN: [id number]     Arrival date & time 01/22/13  0104 History   First MD Initiated Contact with Patient 01/22/13 0156     Chief Complaint  Patient presents with  . Abdominal Pain   (Consider location/radiation/quality/duration/timing/severity/associated sxs/prior Treatment) HPI 47 year old female presents to emergency room from home with complaint of abdominal pain, nausea and vomiting.  Patient reports over the weekend, 4 days ago.  She started with nasal congestion.  She began to have nausea on Sunday.  Today she took some cold medicine.  She has had multiple episodes of emesis.  Today.  Patient reports subjective fevers and chills at home.  Patient is status post Roux-en-Y 2000.  She reports no problems after the surgery.  Abdominal pain is mainly right-sided.  She reports some loose stools. Past Medical History  Diagnosis Date  . IUD     HISTORY OF IUD --REMOVED IN 2006  . Hypertension   . Anemia   . History of blood transfusion   . Anxiety   . Bipolar disorder   . Depression   . Sleep apnea 2000    resolved after gastric bypass  . Seizures 2009,2012    two seizures due to a med changes   Past Surgical History  Procedure Laterality Date  . Rotator cuff repair    . Gastric bypass  2000  . Hysteroscopy w/d&c  11/14/2011    Procedure: DILATATION AND CURETTAGE /HYSTEROSCOPY;  Surgeon: Marylynn Pearson, MD;  Location: [facility];  Service: Gynecology;;  with removal of polyps  . Cholecystectomy     Family History  Problem Relation Age of Onset  . Hypertension Mother   . Hypertension Father   . Diabetes Sister   . Hypertension Sister   . Hypertension Brother   . Hypertension Maternal Aunt   . Hypertension Maternal Uncle   . Hypertension Maternal Grandmother   . Hypertension Maternal Grandfather   . Hypertension Paternal Grandmother   . Hypertension Paternal Grandfather   . Diabetes Cousin    History  Substance Use Topics  . Smoking status: Former Smoker -- 0.00 packs/day     Types: Cigarettes    Quit date: 03/21/1988  . Smokeless tobacco: Never Used  . Alcohol Use: Yes     Comment: social   OB History   Grav Para Term Preterm Abortions TAB SAB Ect Mult Living   1    1  1    0     Review of Systems  All other systems reviewed and are negative.   See History of Present Illness; otherwise all other systems are reviewed and negative  Allergies  Sulfa antibiotics  Home Medications   Current Outpatient Rx  Name  Route  Sig  Dispense  Refill  . amLODipine (NORVASC) 10 MG tablet   Oral   Take 10 mg by mouth daily.         . carvedilol (COREG) 12.5 MG tablet   Oral   Take 12.5 mg by mouth 2 (two) times daily with a meal.         . diazepam (VALIUM) 5 MG tablet   Oral   Take 5 mg by mouth every 6 (six) hours as needed (spasams).         Marland Kitchen esomeprazole (NEXIUM) 40 MG capsule   Oral   Take 1 capsule (40 mg total) by mouth daily.   30 capsule   0   . furosemide (LASIX) 20 MG tablet   Oral   Take 20  mg by mouth every evening.          . lidocaine (LIDODERM) 5 %   Transdermal   Place 1 patch onto the skin daily as needed. For shoulder pain Remove & Discard patch within 12 hours or as directed by MD         . losartan (COZAAR) 25 MG tablet   Oral   Take 25 mg by mouth daily.         . traMADol (ULTRAM) 50 MG tablet   Oral   Take 50 mg by mouth every 4 (four) hours as needed for pain.          BP 164/98  Pulse 84  Temp(Src) 100.4 F (38 C) (Oral)  Resp 26  SpO2 100% Physical Exam  Nursing note and vitals reviewed. Constitutional: She is oriented to person, place, and time. She appears well-developed and well-nourished. She appears distressed (uncomfortable appearing).  Obese female, uncomfortable appearing  HENT:  Head: Normocephalic and atraumatic.  Nose: Nose normal.  Mouth/Throat: Oropharynx is clear and moist.  Eyes: Conjunctivae and EOM are normal. Pupils are equal, round, and reactive to light.  Neck: Normal  range of motion. Neck supple. No JVD present. No tracheal deviation present. No thyromegaly present.  Cardiovascular: Normal rate, regular rhythm, normal heart sounds and intact distal pulses.  Exam reveals no gallop and no friction rub.   No murmur heard. Pulmonary/Chest: Effort normal and breath sounds normal. No stridor. No respiratory distress. She has no wheezes. She has no rales. She exhibits no tenderness.  Abdominal: Soft. Bowel sounds are normal. She exhibits no distension and no mass. There is tenderness (tenderness to both right upper and lower abdominal area.  Significant tenderness, no rebound, but voluntary guarding of the area). There is no rebound and no guarding.  Musculoskeletal: Normal range of motion. She exhibits no edema and no tenderness.  Lymphadenopathy:    She has no cervical adenopathy.  Neurological: She is alert and oriented to person, place, and time. She exhibits normal muscle tone. Coordination normal.  Skin: Skin is warm and dry. No rash noted. No erythema. No pallor.  Psychiatric: She has a normal mood and affect. Her behavior is normal. Judgment and thought content normal.    ED Course  Procedures (including critical care time) Labs Review Labs Reviewed  CBC WITH DIFFERENTIAL - Abnormal; Notable for the following:    Neutrophils Relative % 81 (*)    Neutro Abs 7.8 (*)    All other components within normal limits  COMPREHENSIVE METABOLIC PANEL - Abnormal; Notable for the following:    Glucose, Bld 122 (*)    Creatinine, Ser 1.33 (*)    Alkaline Phosphatase 145 (*)    GFR calc non Af Amer 47 (*)    GFR calc Af Amer 55 (*)    All other components within normal limits  LIPASE, BLOOD - Abnormal; Notable for the following:    Lipase 67 (*)    All other components within normal limits  URINALYSIS, ROUTINE W REFLEX MICROSCOPIC - Abnormal; Notable for the following:    APPearance CLOUDY (*)    Hgb urine dipstick SMALL (*)    Protein, ur >300 (*)    All  other components within normal limits  URINE MICROSCOPIC-ADD ON   Imaging Review Ct Abdomen Pelvis W Contrast  01/22/2013   CLINICAL DATA:  Right-sided abdominal pain  EXAM: CT ABDOMEN AND PELVIS WITH CONTRAST  TECHNIQUE: Multidetector CT imaging of the abdomen and  pelvis was performed using the standard protocol following bolus administration of intravenous contrast.  CONTRAST:  148mL OMNIPAQUE IOHEXOL 300 MG/ML  SOLN  COMPARISON:  CT abdomen and pelvis 04/16/2012.  FINDINGS: Lung Bases: Unremarkable.  Abdomen/Pelvis: Postoperative changes in the upper, likely from prior gastric bypass surgery. Mild intrahepatic biliary ductal dilatation. Common bile duct is normal in caliber measuring 6 mm in the porta hepatis. Status post cholecystectomy. The appearance of the pancreas, spleen and bilateral adrenal glands is unremarkable. Mild multifocal cortical thinning in the kidneys bilaterally may indicate mild scarring. 2 mm nonobstructive calculus in the interpolar collecting system of the left kidney.  No significant volume of ascites. No pneumoperitoneum. No pathologic distention of small bowel. No definite lymphadenopathy identified within the abdomen or pelvis. Normal appendix. IUD present in the uterus. Uterus and ovaries otherwise unremarkable in appearance. Bladder is in appearance small ventral hernia containing a short segment of transverse colon, without evidence of incarceration or obstruction at this time.  Musculoskeletal: There are no aggressive appearing lytic or blastic lesions noted in the visualized portions of the skeleton.  IMPRESSION: 1. No acute findings in the abdomen or pelvis to account for the patient's symptoms. 2. 2 mm nonobstructive calculus in the interpolar collecting system of the left kidney. 3. Small ventral hernia containing a short segment of mid transverse colon, without evidence bowel incarceration obstruction at this time. 4. IUD in position in the uterus. 5. Appendix.    Electronically Signed   By: Vinnie Langton M.D.   On: 01/22/2013 04:37    EKG Interpretation     Ventricular Rate:  87 PR Interval:  193 QRS Duration: 87 QT Interval:  364 QTC Calculation: 438 R Axis:   1 Text Interpretation:  Sinus rhythm Consider anterior infarct Borderline T abnormalities, inferior leads            MDM   1. Abdominal pain   2. Nausea and vomiting    47 year old female with abdominal pain, nausea and vomiting.  She has low-grade fevers, here.  We'll check labs, given tenderness on exam, will need CT abdomen pelvis.  4:54 AM Pt feeling better.  Tolerated contrast.  Unclear etiology-possible viral gastroenteritis?  Similar presentation 1.5 mo ago.  Will have her f/u with her pcm and gi.    Kalman Drape, MD 01/22/13 [phone]

## 2013-01-24 ENCOUNTER — Encounter (HOSPITAL_COMMUNITY): Payer: Self-pay | Admitting: Emergency Medicine

## 2013-01-24 ENCOUNTER — Emergency Department (HOSPITAL_COMMUNITY): Payer: Medicare Other

## 2013-01-24 ENCOUNTER — Inpatient Hospital Stay (HOSPITAL_COMMUNITY)
Admission: EM | Admit: 2013-01-24 | Discharge: 2013-01-27 | DRG: 392 | Disposition: A | Discharge status: Home / Self Care | Payer: Medicare Other | Attending: Internal Medicine | Admitting: Internal Medicine

## 2013-01-24 DIAGNOSIS — I10 Essential (primary) hypertension: Secondary | ICD-10-CM | POA: Diagnosis present

## 2013-01-24 DIAGNOSIS — E876 Hypokalemia: Secondary | ICD-10-CM | POA: Diagnosis present

## 2013-01-24 DIAGNOSIS — R1115 Cyclical vomiting syndrome unrelated to migraine: Secondary | ICD-10-CM

## 2013-01-24 DIAGNOSIS — N289 Disorder of kidney and ureter, unspecified: Secondary | ICD-10-CM | POA: Diagnosis not present

## 2013-01-24 DIAGNOSIS — R109 Unspecified abdominal pain: Secondary | ICD-10-CM | POA: Diagnosis not present

## 2013-01-24 DIAGNOSIS — E860 Dehydration: Secondary | ICD-10-CM | POA: Diagnosis present

## 2013-01-24 DIAGNOSIS — F411 Generalized anxiety disorder: Secondary | ICD-10-CM | POA: Diagnosis present

## 2013-01-24 DIAGNOSIS — F319 Bipolar disorder, unspecified: Secondary | ICD-10-CM | POA: Diagnosis present

## 2013-01-24 DIAGNOSIS — Z87891 Personal history of nicotine dependence: Secondary | ICD-10-CM

## 2013-01-24 DIAGNOSIS — N179 Acute kidney failure, unspecified: Secondary | ICD-10-CM | POA: Diagnosis not present

## 2013-01-24 DIAGNOSIS — R1011 Right upper quadrant pain: Secondary | ICD-10-CM | POA: Diagnosis not present

## 2013-01-24 DIAGNOSIS — R112 Nausea with vomiting, unspecified: Secondary | ICD-10-CM | POA: Diagnosis present

## 2013-01-24 DIAGNOSIS — A088 Other specified intestinal infections: Principal | ICD-10-CM | POA: Diagnosis present

## 2013-01-24 DIAGNOSIS — G8929 Other chronic pain: Secondary | ICD-10-CM | POA: Diagnosis present

## 2013-01-24 DIAGNOSIS — K219 Gastro-esophageal reflux disease without esophagitis: Secondary | ICD-10-CM | POA: Diagnosis present

## 2013-01-24 DIAGNOSIS — Z9884 Bariatric surgery status: Secondary | ICD-10-CM

## 2013-01-24 DIAGNOSIS — R569 Unspecified convulsions: Secondary | ICD-10-CM | POA: Diagnosis present

## 2013-01-24 LAB — CBC WITH DIFFERENTIAL/PLATELET
Basophils Absolute: 0 10*3/uL (ref 0.0–0.1)
Basophils Relative: 0 % (ref 0–1)
Eosinophils Absolute: 0.3 10*3/uL (ref 0.0–0.7)
Eosinophils Relative: 3 % (ref 0–5)
HCT: 46.3 % — ABNORMAL HIGH (ref 36.0–46.0)
Hemoglobin: 15.4 g/dL — ABNORMAL HIGH (ref 12.0–15.0)
Lymphocytes Relative: 22 % (ref 12–46)
Lymphs Abs: 2.2 10*3/uL (ref 0.7–4.0)
MCH: 26.9 pg (ref 26.0–34.0)
MCHC: 33.3 g/dL (ref 30.0–36.0)
MCV: 80.9 fL (ref 78.0–100.0)
Monocytes Absolute: 1.2 10*3/uL — ABNORMAL HIGH (ref 0.1–1.0)
Monocytes Relative: 12 % (ref 3–12)
Neutro Abs: 6.2 10*3/uL (ref 1.7–7.7)
Neutrophils Relative %: 63 % (ref 43–77)
Platelets: 215 10*3/uL (ref 150–400)
RBC: 5.72 MIL/uL — ABNORMAL HIGH (ref 3.87–5.11)
RDW: 14.8 % (ref 11.5–15.5)
WBC: 9.9 10*3/uL (ref 4.0–10.5)

## 2013-01-24 LAB — COMPREHENSIVE METABOLIC PANEL
ALT: 16 U/L (ref 0–35)
AST: 22 U/L (ref 0–37)
Albumin: 4 g/dL (ref 3.5–5.2)
Alkaline Phosphatase: 157 U/L — ABNORMAL HIGH (ref 39–117)
BUN: 16 mg/dL (ref 6–23)
CO2: 21 mEq/L (ref 19–32)
Calcium: 9.2 mg/dL (ref 8.4–10.5)
Chloride: 102 mEq/L (ref 96–112)
Creatinine, Ser: 1.7 mg/dL — ABNORMAL HIGH (ref 0.50–1.10)
GFR calc Af Amer: 41 mL/min — ABNORMAL LOW (ref >90)
GFR calc non Af Amer: 35 mL/min — ABNORMAL LOW (ref >90)
Glucose, Bld: 110 mg/dL — ABNORMAL HIGH (ref 70–99)
Potassium: 3.3 mEq/L — ABNORMAL LOW (ref 3.5–5.1)
Sodium: 138 mEq/L (ref 135–145)
Total Bilirubin: 1.7 mg/dL — ABNORMAL HIGH (ref 0.3–1.2)
Total Protein: 7.5 g/dL (ref 6.0–8.3)

## 2013-01-24 LAB — URINE MICROSCOPIC-ADD ON

## 2013-01-24 LAB — URINALYSIS, ROUTINE W REFLEX MICROSCOPIC
Glucose, UA: 250 mg/dL — AB
Ketones, ur: 15 mg/dL — AB
Nitrite: NEGATIVE
Protein, ur: >300 mg/dL — AB
Specific Gravity, Urine: 1.028 (ref 1.005–1.030)
Urobilinogen, UA: 1 mg/dL (ref 0.0–1.0)
pH: 7.5 (ref 5.0–8.0)

## 2013-01-24 LAB — LIPASE, BLOOD: Lipase: 80 U/L — ABNORMAL HIGH (ref 11–59)

## 2013-01-24 MED ORDER — METOCLOPRAMIDE HCL 5 MG/ML IJ SOLN
10.0000 mg | Freq: Once | INTRAMUSCULAR | Status: AC
  Administered 2013-01-24: 10 mg via INTRAVENOUS
  Filled 2013-01-24: qty 2

## 2013-01-24 MED ORDER — MORPHINE SULFATE 4 MG/ML IJ SOLN
4.0000 mg | Freq: Once | INTRAMUSCULAR | Status: AC
  Administered 2013-01-24: 4 mg via INTRAVENOUS
  Filled 2013-01-24: qty 1

## 2013-01-24 MED ORDER — SODIUM CHLORIDE 0.9 % IV SOLN: INTRAVENOUS | Status: DC

## 2013-01-24 MED ORDER — HYDROMORPHONE HCL PF 1 MG/ML IJ SOLN
1.0000 mg | INTRAMUSCULAR | Status: DC | PRN
  Administered 2013-01-24: 1 mg via INTRAVENOUS
  Filled 2013-01-24: qty 1

## 2013-01-24 MED ORDER — CARVEDILOL 12.5 MG PO TABS
12.5000 mg | ORAL_TABLET | Freq: Two times a day (BID) | ORAL | Status: DC
  Administered 2013-01-25 – 2013-01-27 (×5): 12.5 mg via ORAL
  Filled 2013-01-24 (×8): qty 1

## 2013-01-24 MED ORDER — GI COCKTAIL ~~LOC~~
30.0000 mL | Freq: Once | ORAL | Status: AC
  Administered 2013-01-24: 30 mL via ORAL
  Filled 2013-01-24: qty 30

## 2013-01-24 MED ORDER — LIDOCAINE 5 % EX PTCH
1.0000 | MEDICATED_PATCH | Freq: Every day | CUTANEOUS | Status: DC | PRN
  Filled 2013-01-24: qty 1

## 2013-01-24 MED ORDER — ONDANSETRON HCL 4 MG/2ML IJ SOLN
4.0000 mg | Freq: Once | INTRAMUSCULAR | Status: AC
  Administered 2013-01-24: 4 mg via INTRAVENOUS
  Filled 2013-01-24: qty 2

## 2013-01-24 MED ORDER — ONDANSETRON HCL 4 MG/2ML IJ SOLN
4.0000 mg | Freq: Four times a day (QID) | INTRAMUSCULAR | Status: DC | PRN
  Administered 2013-01-26: 4 mg via INTRAVENOUS
  Filled 2013-01-24: qty 2

## 2013-01-24 MED ORDER — HYDROMORPHONE HCL PF 1 MG/ML IJ SOLN
0.5000 mg | INTRAMUSCULAR | Status: DC | PRN
  Administered 2013-01-25 – 2013-01-27 (×9): 0.5 mg via INTRAVENOUS
  Filled 2013-01-24 (×9): qty 1

## 2013-01-24 MED ORDER — HEPARIN SODIUM (PORCINE) 5000 UNIT/ML IJ SOLN
5000.0000 [IU] | Freq: Three times a day (TID) | INTRAMUSCULAR | Status: DC
  Administered 2013-01-25 – 2013-01-27 (×7): 5000 [IU] via SUBCUTANEOUS
  Filled 2013-01-24 (×10): qty 1

## 2013-01-24 MED ORDER — PANTOPRAZOLE SODIUM 40 MG IV SOLR
40.0000 mg | Freq: Once | INTRAVENOUS | Status: AC
  Administered 2013-01-24: 40 mg via INTRAVENOUS
  Filled 2013-01-24: qty 40

## 2013-01-24 MED ORDER — SODIUM CHLORIDE 0.9 % IV BOLUS (SEPSIS)
1000.0000 mL | Freq: Once | INTRAVENOUS | Status: AC
  Administered 2013-01-24: 1000 mL via INTRAVENOUS

## 2013-01-24 MED ORDER — ONDANSETRON HCL 4 MG PO TABS: 4.0000 mg | ORAL_TABLET | Freq: Four times a day (QID) | ORAL | Status: DC | PRN

## 2013-01-24 MED ORDER — ONDANSETRON HCL 4 MG/2ML IJ SOLN
4.0000 mg | Freq: Four times a day (QID) | INTRAMUSCULAR | Status: DC | PRN
  Administered 2013-01-24: 4 mg via INTRAVENOUS
  Filled 2013-01-24: qty 2

## 2013-01-24 MED ORDER — DIPHENOXYLATE-ATROPINE 2.5-0.025 MG PO TABS
2.0000 | ORAL_TABLET | ORAL | Status: AC
  Administered 2013-01-24: 2 via ORAL
  Filled 2013-01-24: qty 2

## 2013-01-24 MED ORDER — PANTOPRAZOLE SODIUM 40 MG IV SOLR
40.0000 mg | INTRAVENOUS | Status: DC
  Administered 2013-01-25: 40 mg via INTRAVENOUS
  Filled 2013-01-24: qty 40

## 2013-01-24 MED ORDER — PROMETHAZINE HCL 25 MG RE SUPP
25.0000 mg | Freq: Four times a day (QID) | RECTAL | Status: DC | PRN
  Filled 2013-01-24: qty 1

## 2013-01-24 MED ORDER — POTASSIUM CHLORIDE IN NACL 20-0.9 MEQ/L-% IV SOLN
INTRAVENOUS | Status: DC
  Administered 2013-01-25 – 2013-01-27 (×5): via INTRAVENOUS
  Filled 2013-01-24 (×10): qty 1000

## 2013-01-24 NOTE — ED Provider Notes (Signed)
CSN: [id number]     Arrival date & time 01/24/13  1738 History   First MD Initiated Contact with Patient 01/24/13 1851     Chief Complaint  Patient presents with  . Emesis  . Diarrhea   (Consider location/radiation/quality/duration/timing/severity/associated sxs/prior Treatment) HPI Comments: Patient is a 47 year old female with a past medical history of roux-en-Y procedure in 2000 as well as cholecystectomy in 2000 who presents with RUQ pain for the past 3 days. Symptoms started gradually and progressively worsened since the onset. The pain is aching and located in her RUQ. The pain does not radiate. No aggravating/alleviating factors. Patient reports associated NVD. Patient was seen in the ED 3 days ago with the same symptoms and was discharged with nausea and pain medications. She tried the medications she was prescribed the other night without relief.   Patient is a 47 y.o. female presenting with vomiting and diarrhea.  Emesis Associated symptoms: abdominal pain and diarrhea   Diarrhea Associated symptoms: abdominal pain and vomiting     Past Medical History  Diagnosis Date  . IUD     HISTORY OF IUD --REMOVED IN 2006  . Hypertension   . Anemia   . History of blood transfusion   . Anxiety   . Bipolar disorder   . Depression   . Sleep apnea 2000    resolved after gastric bypass  . Seizures 2009,2012    two seizures due to a med changes   Past Surgical History  Procedure Laterality Date  . Rotator cuff repair    . Gastric bypass  2000  . Hysteroscopy w/d&c  11/14/2011    Procedure: DILATATION AND CURETTAGE /HYSTEROSCOPY;  Surgeon: Marylynn Pearson, MD;  Location: [facility];  Service: Gynecology;;  with removal of polyps  . Cholecystectomy     Family History  Problem Relation Age of Onset  . Hypertension Mother   . Hypertension Father   . Diabetes Sister   . Hypertension Sister   . Hypertension Brother   . Hypertension Maternal Aunt   . Hypertension Maternal Uncle   .  Hypertension Maternal Grandmother   . Hypertension Maternal Grandfather   . Hypertension Paternal Grandmother   . Hypertension Paternal Grandfather   . Diabetes Cousin    History  Substance Use Topics  . Smoking status: Former Smoker -- 0.00 packs/day    Types: Cigarettes    Quit date: 03/21/1988  . Smokeless tobacco: Never Used  . Alcohol Use: Yes     Comment: social   OB History   Grav Para Term Preterm Abortions TAB SAB Ect Mult Living   1    1  1    0     Review of Systems  Gastrointestinal: Positive for nausea, vomiting, abdominal pain and diarrhea.  All other systems reviewed and are negative.    Allergies  Sulfa antibiotics  Home Medications   Current Outpatient Rx  Name  Route  Sig  Dispense  Refill  . amLODipine (NORVASC) 10 MG tablet   Oral   Take 10 mg by mouth daily.         . carvedilol (COREG) 12.5 MG tablet   Oral   Take 12.5 mg by mouth 2 (two) times daily with a meal.         . diazepam (VALIUM) 5 MG tablet   Oral   Take 5 mg by mouth every 6 (six) hours as needed (spasams).         . dicyclomine (BENTYL) 20  MG tablet   Oral   Take 1 tablet (20 mg total) by mouth every 6 (six) hours as needed (for abdominal cramping).   20 tablet   0   . esomeprazole (NEXIUM) 40 MG capsule   Oral   Take 1 capsule (40 mg total) by mouth daily.   30 capsule   0   . furosemide (LASIX) 20 MG tablet   Oral   Take 20 mg by mouth every evening.          . lidocaine (LIDODERM) 5 %   Transdermal   Place 1 patch onto the skin daily as needed. For shoulder pain Remove & Discard patch within 12 hours or as directed by MD         . losartan (COZAAR) 25 MG tablet   Oral   Take 25 mg by mouth daily.         . ondansetron (ZOFRAN ODT) 8 MG disintegrating tablet   Oral   Take 1 tablet (8 mg total) by mouth every 8 (eight) hours as needed for nausea.   20 tablet   0   . promethazine (PHENERGAN) 25 MG suppository   Rectal   Place 1 suppository (25  mg total) rectally every 6 (six) hours as needed for nausea.   12 each   0   . promethazine (PHENERGAN) 25 MG tablet   Oral   Take 1 tablet (25 mg total) by mouth every 6 (six) hours as needed for nausea.   30 tablet   0   . traMADol (ULTRAM) 50 MG tablet   Oral   Take 50 mg by mouth every 4 (four) hours as needed for pain.          BP 164/122  Pulse 107  Temp(Src) 98.6 F (37 C) (Oral)  Wt 230 lb (104.327 kg)  SpO2 97% Physical Exam  Nursing note and vitals reviewed. Constitutional: She is oriented to person, place, and time. She appears well-developed and well-nourished. No distress.  HENT:  Head: Normocephalic and atraumatic.  Eyes: Conjunctivae and EOM are normal. Pupils are equal, round, and reactive to light. No scleral icterus.  Neck: Normal range of motion.  Cardiovascular: Normal rate and regular rhythm.  Exam reveals no gallop and no friction rub.   No murmur heard. Pulmonary/Chest: Effort normal and breath sounds normal. She has no wheezes. She has no rales. She exhibits no tenderness.  Abdominal: Soft. She exhibits no distension. There is tenderness. There is no rebound and no guarding.  RUQ tenderness to palpation. No peritoneal signs or other focal tenderness to palpation.   Musculoskeletal: Normal range of motion.  Neurological: She is alert and oriented to person, place, and time.  Speech is goal-oriented. Moves limbs without ataxia.   Skin: Skin is warm and dry.  Psychiatric: She has a normal mood and affect. Her behavior is normal.    ED Course  Procedures (including critical care time) Labs Review Labs Reviewed  COMPREHENSIVE METABOLIC PANEL - Abnormal; Notable for the following:    Potassium 3.3 (*)    Glucose, Bld 110 (*)    Creatinine, Ser 1.70 (*)    Alkaline Phosphatase 157 (*)    Total Bilirubin 1.7 (*)    GFR calc non Af Amer 35 (*)    GFR calc Af Amer 41 (*)    All other components within normal limits  CBC WITH DIFFERENTIAL -  Abnormal; Notable for the following:    RBC 5.72 (*)  Hemoglobin 15.4 (*)    HCT 46.3 (*)    Monocytes Absolute 1.2 (*)    All other components within normal limits  LIPASE, BLOOD - Abnormal; Notable for the following:    Lipase 80 (*)    All other components within normal limits  URINALYSIS, ROUTINE W REFLEX MICROSCOPIC - Abnormal; Notable for the following:    Color, Urine AMBER (*)    APPearance CLOUDY (*)    Glucose, UA 250 (*)    Hgb urine dipstick TRACE (*)    Bilirubin Urine SMALL (*)    Ketones, ur 15 (*)    Protein, ur >300 (*)    Leukocytes, UA SMALL (*)    All other components within normal limits  URINE MICROSCOPIC-ADD ON - Abnormal; Notable for the following:    Squamous Epithelial / LPF FEW (*)    Bacteria, UA FEW (*)    Casts HYALINE CASTS (*)    All other components within normal limits   Imaging Review US Abdomen Complete  01/24/2013   CLINICAL DATA:  Abdominal pain  EXAM: ULTRASOUND ABDOMEN COMPLETE  COMPARISON:  None.  FINDINGS: Gallbladder  Surgically removed.  Common bile duct  Diameter: 6 mm.  Liver  No focal lesion identified. Within normal limits in parenchymal echogenicity.  IVC  No abnormality visualized.  Pancreas  Limited visualization due to overlying bowel gas.  Spleen  Size and appearance within normal limits.  Right Kidney  Length: 10.1 cm. Increased echogenicity is noted.  Left Kidney  Length: 9.5 cm. Increased echogenicity is noted.  Abdominal aorta  No aneurysm visualized.  IMPRESSION: Increased echogenicity likely related to medical renal disease. Correlation with laboratory values is recommended.  Status post cholecystectomy. The prominence of the common bile duct is likely related to the post cholecystectomy state.  No other focal abnormality is noted.   Electronically Signed   By: Inez Catalina M.D.   On: 01/24/2013 20:26    EKG Interpretation   None       MDM   1. Abdominal pain   2. Nausea and vomiting   3. Renal insufficiency   4.  AKI (acute kidney injury)   5. Essential hypertension, benign   6. Intractable nausea and vomiting     7:10 PM Labs show increased alk phos, lipase, and bilirubin. Patient has RUQ and will have RUQ [place] for further evaluation. Patient tachycardic and afebrile. Patient will have morphine, zofran, fluids, and lomotil for symptoms.   Patient signed out to [facility], [place], PA-C 01/25/13 2325

## 2013-01-24 NOTE — ED Notes (Signed)
Pt c/o N/V/D x 3 days; pt seen here Monday for same but not feeling better

## 2013-01-24 NOTE — ED Provider Notes (Signed)
Pt received from [place], [place].  Pt presents for the second time in two days w/ N/V/D and RUQ pain.  CT negative on 11/4 and pt d/c'd home w/ po/pr anti-emetics.  No relief w/ either and not tolerating pos.  [place] non-acute today w/ exception of hyperechogenicity of kidneys.  Labs sig for mildly elevated lipase, elevated CR and proteinuria.  She has a h/o renal insufficiency.  Etiology of pain has not been determined.  She has had two doses of morphine w/ minimal relief of pain.  No active vomiting in the past couple hours, but nausea persistent and she is unable to drink fluids.  She continues to be tachycardic in low 100s as well. [facility] consulted for admission for intractable abd pain and nausea as well as dehydration.  10:30 PM   Remer Macho, PA-C 01/24/13 2314

## 2013-01-24 NOTE — H&P (Signed)
[facility] History and Physical  Patient: [name]  [id number]  DOB: October 21, 1965  DOA: 01/24/2013  Referring physician: Remer Macho, PA-C PCP: Maximino Greenland, MD  Consults:     Chief Complaint: Nausea and vomiting and intractable pain  HPI: [name] is a 47 y.o. female with Past medical history of anxiety, hypertension, cholecystectomy and gastric bypass. She presented today with the complaint of nausea and vomiting that has been ongoing since last Saturday associated with abdominal pain which has been ongoing since last 2 days located in the right upper quadrant region. The pain she mentions slight crampy pain and she denies any similar pain in the past. She denies any diarrhea or active bleeding. She denies any burning urination. She denies any shortness of breath, chest pain, leg swelling, any rash anywhere. She has been seen in the ER on 11/4 with similar complaints at that time her pain was improved with IV pain medication and nausea was also improved but today her symptoms are not improving. She denies any change in her diet or medications.  Review of Systems: as mentioned in the history of present illness.  A Comprehensive review of the other systems is negative.  Past Medical History  Diagnosis Date  . IUD     HISTORY OF IUD --REMOVED IN 2006  . Hypertension   . Anemia   . History of blood transfusion   . Anxiety   . Bipolar disorder   . Depression   . Sleep apnea 2000    resolved after gastric bypass  . Seizures 2009,2012    two seizures due to a med changes   Past Surgical History  Procedure Laterality Date  . Rotator cuff repair    . Gastric bypass  2000  . Hysteroscopy w/d&c  11/14/2011    Procedure: DILATATION AND CURETTAGE /HYSTEROSCOPY;  Surgeon: Marylynn Pearson, MD;  Location: [facility];  Service: Gynecology;;  with removal of polyps  . Cholecystectomy     Social History:  reports that she quit smoking about 24 years ago.  Her smoking use included Cigarettes. She smoked 0.00 packs per day. She has never used smokeless tobacco. She reports that she drinks alcohol. She reports that she does not use illicit drugs. Patient is coming from home. Independent for most of her  ADL.  Allergies  Allergen Reactions  . Sulfa Antibiotics Rash    Family History  Problem Relation Age of Onset  . Hypertension Mother   . Hypertension Father   . Diabetes Sister   . Hypertension Sister   . Hypertension Brother   . Hypertension Maternal Aunt   . Hypertension Maternal Uncle   . Hypertension Maternal Grandmother   . Hypertension Maternal Grandfather   . Hypertension Paternal Grandmother   . Hypertension Paternal Grandfather   . Diabetes Cousin     Prior to Admission medications   Medication Sig Start Date End Date Taking? Authorizing Provider  amLODipine (NORVASC) 10 MG tablet Take 10 mg by mouth daily.   Yes Historical Provider, MD  carvedilol (COREG) 12.5 MG tablet Take 12.5 mg by mouth 2 (two) times daily with a meal.   Yes Historical Provider, MD  diazepam (VALIUM) 5 MG tablet Take 5 mg by mouth every 6 (six) hours as needed (spasams).   Yes Historical Provider, MD  dicyclomine (BENTYL) 20 MG tablet Take 1 tablet (20 mg total) by mouth every 6 (six) hours as needed (for abdominal cramping). 01/22/13  Yes Kalman Drape, MD  esomeprazole (NEXIUM) 40 MG capsule Take 1 capsule (40 mg total) by mouth daily. 12/01/12  Yes Elyn Peers, MD  furosemide (LASIX) 20 MG tablet Take 20 mg by mouth every evening.    Yes Historical Provider, MD  lidocaine (LIDODERM) 5 % Place 1 patch onto the skin daily as needed. For shoulder pain Remove & Discard patch within 12 hours or as directed by MD   Yes Historical Provider, MD  losartan (COZAAR) 25 MG tablet Take 25 mg by mouth daily.   Yes Historical Provider, MD  ondansetron (ZOFRAN ODT) 8 MG disintegrating tablet Take 1 tablet (8 mg total) by mouth every 8 (eight) hours as needed for  nausea. 01/22/13  Yes Kalman Drape, MD  promethazine (PHENERGAN) 25 MG suppository Place 1 suppository (25 mg total) rectally every 6 (six) hours as needed for nausea. 01/22/13  Yes Kalman Drape, MD  promethazine (PHENERGAN) 25 MG tablet Take 1 tablet (25 mg total) by mouth every 6 (six) hours as needed for nausea. 01/22/13  Yes Kalman Drape, MD  traMADol (ULTRAM) 50 MG tablet Take 50 mg by mouth every 4 (four) hours as needed for pain.   Yes Historical Provider, MD    Physical Exam: Filed Vitals:   01/24/13 2030 01/24/13 2115 01/24/13 2200 01/24/13 2300  BP: 151/98 151/99 137/96 128/82  Pulse: 88 87 90 88  Temp:      TempSrc:      Resp:      Weight:      SpO2: 99% 97% 96% 97%    General: Alert, Awake and Oriented to Time, Place and Person. Appear in moderate distress Eyes: PERRL ENT: Oral Mucosa clear moist. Neck: no JVD, no Carotid Bruits  Cardiovascular: S1 and S2 Present, no Murmur, Peripheral Pulses Present Respiratory: Bilateral Air entry equal and Decreased, Clear to Auscultation,  no Crackles,no wheezes Abdomen: Bowel Sound Present, Soft and minimally tender on right upper quadrant and right lower quadrant, negative Murphy sign, no guarding no rigidity.  Skin: no Rash Extremities: no Pedal edema, no calf tenderness Neurologic: Grossly Unremarkable.  Labs on Admission:  CBC:  Recent Labs Lab 01/22/13 0204 01/24/13 1755  WBC 9.6 9.9  NEUTROABS 7.8* 6.2  HGB 13.7 15.4*  HCT 40.2 46.3*  MCV 81.7 80.9  PLT 199 215    CMP     Component Value Date/Time   NA 138 01/24/2013 1755   K 3.3* 01/24/2013 1755   CL 102 01/24/2013 1755   CO2 21 01/24/2013 1755   GLUCOSE 110* 01/24/2013 1755   BUN 16 01/24/2013 1755   CREATININE 1.70* 01/24/2013 1755   CALCIUM 9.2 01/24/2013 1755   PROT 7.5 01/24/2013 1755   ALBUMIN 4.0 01/24/2013 1755   AST 22 01/24/2013 1755   ALT 16 01/24/2013 1755   ALKPHOS 157* 01/24/2013 1755   BILITOT 1.7* 01/24/2013 1755   GFRNONAA 35* 01/24/2013 1755    GFRAA 41* 01/24/2013 1755     Recent Labs Lab 01/22/13 0204 01/24/13 1755  LIPASE 67* 80*   No results found for this basename: AMMONIA,  in the last 168 hours  Cardiac Enzymes: No results found for this basename: CKTOTAL, CKMB, CKMBINDEX, TROPONINI,  in the last 168 hours  BNP (last 3 results) No results found for this basename: PROBNP,  in the last 8760 hours  Radiological Exams on Admission: US Abdomen Complete  01/24/2013   CLINICAL DATA:  Abdominal pain  EXAM: ULTRASOUND ABDOMEN COMPLETE  COMPARISON:  None.  FINDINGS: Gallbladder  Surgically removed.  Common bile duct  Diameter: 6 mm.  Liver  No focal lesion identified. Within normal limits in parenchymal echogenicity.  IVC  No abnormality visualized.  Pancreas  Limited visualization due to overlying bowel gas.  Spleen  Size and appearance within normal limits.  Right Kidney  Length: 10.1 cm. Increased echogenicity is noted.  Left Kidney  Length: 9.5 cm. Increased echogenicity is noted.  Abdominal aorta  No aneurysm visualized.  IMPRESSION: Increased echogenicity likely related to medical renal disease. Correlation with laboratory values is recommended.  Status post cholecystectomy. The prominence of the common bile duct is likely related to the post cholecystectomy state.  No other focal abnormality is noted.   Electronically Signed   By: Inez Catalina M.D.   On: 01/24/2013 20:26    Assessment/Plan Principal Problem:   Intractable nausea and vomiting Active Problems:   Essential hypertension, benign   Hypokalemia   AKI (acute kidney injury)   Abdominal pain, unspecified site   1. Intractable nausea and vomiting Patient had intractable nausea and vomiting which is not getting better with single dose of IV antiemetic. Also her pain is not improving. She has been seen with acute kidney injury. She has mildly elevated lipase levels but not qualifying for diagnosis of acute pancreatitis. She had a CT scan done 2 days ago which was  negative for any acute intra-abdominal abnormality. Today also she had an ultrasound of the abdomen that does only showing into his echogenicity of kidneys and otherwise an unremarkable. At present the etiology of her nausea and vomiting and abdominal pain is unclear. We will do IV fluids, IV pain medications, and IV Zofran for supportive management. Patient will be kept nothing by mouth overnight for bowel rest.  2.Acute kidney injury  Likely secondary to dehydration  Continue IV fluids hold Lasix   DVT Prophylaxis: subcutaneous Heparin Nutrition: N.p.o.  Code Status: Full  Disposition: Admitted to observation in med surge.  Author: Berle Mull, MD [facility] Pager: [phone] 01/24/2013, 11:48 PM    If 7PM-7AM, please contact night-coverage [email or web address] Password [email or web address]

## 2013-01-25 ENCOUNTER — Encounter (HOSPITAL_COMMUNITY): Payer: Self-pay | Admitting: *Deleted

## 2013-01-25 DIAGNOSIS — R112 Nausea with vomiting, unspecified: Secondary | ICD-10-CM | POA: Diagnosis present

## 2013-01-25 DIAGNOSIS — R109 Unspecified abdominal pain: Secondary | ICD-10-CM | POA: Diagnosis present

## 2013-01-25 DIAGNOSIS — E876 Hypokalemia: Secondary | ICD-10-CM | POA: Diagnosis present

## 2013-01-25 DIAGNOSIS — I10 Essential (primary) hypertension: Secondary | ICD-10-CM | POA: Diagnosis present

## 2013-01-25 LAB — COMPREHENSIVE METABOLIC PANEL
ALT: 12 U/L (ref 0–35)
AST: 17 U/L (ref 0–37)
Albumin: 3.1 g/dL — ABNORMAL LOW (ref 3.5–5.2)
Alkaline Phosphatase: 119 U/L — ABNORMAL HIGH (ref 39–117)
BUN: 21 mg/dL (ref 6–23)
CO2: 21 mEq/L (ref 19–32)
Calcium: 8.2 mg/dL — ABNORMAL LOW (ref 8.4–10.5)
Chloride: 106 mEq/L (ref 96–112)
Creatinine, Ser: 2.08 mg/dL — ABNORMAL HIGH (ref 0.50–1.10)
GFR calc Af Amer: 32 mL/min — ABNORMAL LOW (ref >90)
GFR calc non Af Amer: 27 mL/min — ABNORMAL LOW (ref >90)
Glucose, Bld: 85 mg/dL (ref 70–99)
Potassium: 3.2 mEq/L — ABNORMAL LOW (ref 3.5–5.1)
Sodium: 139 mEq/L (ref 135–145)
Total Bilirubin: 1.4 mg/dL — ABNORMAL HIGH (ref 0.3–1.2)
Total Protein: 6 g/dL (ref 6.0–8.3)

## 2013-01-25 LAB — CBC
HCT: 37.5 % (ref 36.0–46.0)
Hemoglobin: 12.1 g/dL (ref 12.0–15.0)
MCH: 26.7 pg (ref 26.0–34.0)
MCHC: 32.3 g/dL (ref 30.0–36.0)
MCV: 82.8 fL (ref 78.0–100.0)
Platelets: 158 10*3/uL (ref 150–400)
RBC: 4.53 MIL/uL (ref 3.87–5.11)
RDW: 15 % (ref 11.5–15.5)
WBC: 8.5 10*3/uL (ref 4.0–10.5)

## 2013-01-25 MED ORDER — PANTOPRAZOLE SODIUM 40 MG PO TBEC
40.0000 mg | DELAYED_RELEASE_TABLET | Freq: Every day | ORAL | Status: DC
  Administered 2013-01-25 – 2013-01-26 (×2): 40 mg via ORAL
  Filled 2013-01-25 (×3): qty 1

## 2013-01-25 MED ORDER — INFLUENZA VAC SPLIT QUAD 0.5 ML IM SUSP
0.5000 mL | INTRAMUSCULAR | Status: AC
Start: 2013-01-26 — End: 2013-01-26
  Administered 2013-01-26: 0.5 mL via INTRAMUSCULAR
  Filled 2013-01-25: qty 0.5

## 2013-01-25 MED ORDER — METOCLOPRAMIDE HCL 5 MG PO TABS
5.0000 mg | ORAL_TABLET | Freq: Three times a day (TID) | ORAL | Status: DC
  Administered 2013-01-25 – 2013-01-27 (×7): 5 mg via ORAL
  Filled 2013-01-25 (×11): qty 1

## 2013-01-25 MED ORDER — BIOTENE DRY MOUTH MT LIQD
15.0000 mL | Freq: Two times a day (BID) | OROMUCOSAL | Status: DC
  Administered 2013-01-25 – 2013-01-26 (×4): 15 mL via OROMUCOSAL
  Filled 2013-01-25: qty 15

## 2013-01-25 MED ORDER — POTASSIUM CHLORIDE CRYS ER 20 MEQ PO TBCR
40.0000 meq | EXTENDED_RELEASE_TABLET | ORAL | Status: AC
  Administered 2013-01-25: 40 meq via ORAL
  Filled 2013-01-25: qty 2

## 2013-01-25 MED ORDER — CHLORHEXIDINE GLUCONATE 0.12 % MT SOLN
15.0000 mL | Freq: Two times a day (BID) | OROMUCOSAL | Status: DC
Start: 2013-01-25 — End: 2013-01-27
  Administered 2013-01-25 – 2013-01-27 (×5): 15 mL via OROMUCOSAL
  Filled 2013-01-25 (×8): qty 15

## 2013-01-25 NOTE — Progress Notes (Signed)
UR COMPLETED. Patient changed to inpatient status r/t to poor po intake.  Receiving IVF @ 75cc/hr and IV antiemetics.

## 2013-01-25 NOTE — Progress Notes (Signed)
Patient admitted to 5w27 from ED. Patient lives at home alone. Patient's skin is warm, dry and intact. Patient's bilateral legs are swollen. Patient is A&Ox3. Patient oriented to unit and room. Will continue to monitor patient. Ranelle Oyster, RN

## 2013-01-25 NOTE — Progress Notes (Signed)
INITIAL NUTRITION ASSESSMENT  DOCUMENTATION CODES Per approved criteria  -Obesity Unspecified   INTERVENTION: Once diet upgraded, recommend adding Boost Plus BID, each supplement provides 360 kcal and 14 grams of protein.  NUTRITION DIAGNOSIS: Inadequate oral intake related to nausea, vomiting and intractable pain as evidenced by reported wt loss and reported intake less than estimated needs.   Goal: Pt to meet >/= 90% of their estimated nutrition needs  Monitor:  Weight, po intake, labs, I/O's  Reason for Assessment: MST  47 y.o. female  Admitting Dx: Intractable nausea and vomiting  ASSESSMENT: Pt admitted for nausea, vomiting, and intractable pain. Pt with PMH of anxiety, hypertension, cholecystectectomy, and gastric bypass.  Pt reports that she has lost about 10 lbs over the past week from nausea, vomiting, and intractable pain. She says that she drinks Boost at home. Pt was advised not to continue Boost if her meal intake improves to 75-100% of her trays to avoid weight gain.  Height: Ht Readings from Last 1 Encounters:  01/25/13 5' (1.524 m)    Weight: Wt Readings from Last 1 Encounters:  01/25/13 203 lb 4.2 oz (92.2 kg)    Ideal Body Weight: 68.5 kg  % Ideal Body Weight: 135%  Wt Readings from Last 10 Encounters:  01/25/13 203 lb 4.2 oz (92.2 kg)  04/30/12 230 lb (104.327 kg)  04/15/12 233 lb (105.688 kg)  04/10/12 233 lb (105.688 kg)  04/30/12 230 lb (104.327 kg)  12/04/11 267 lb (121.11 kg)  11/07/11 280 lb (127.007 kg)  07/20/11 280 lb (127.007 kg)  02/04/11 288 lb (130.636 kg)    Usual Body Weight: 213 lbs  % Usual Body Weight: 95%  BMI:  Body mass index is 39.7 kg/(m^2).  Estimated Nutritional Needs: Kcal: 2000-2200 Protein: 90-110 g Fluid: 2.0-2.2 L  Skin: WNL  Diet Order: Clear Liquid  EDUCATION NEEDS: -No education needs identified at this time   Intake/Output Summary (Last 24 hours) at 01/25/13 1239 Last data filed at 01/25/13  0900  Gross per 24 hour  Intake 533.75 ml  Output      0 ml  Net 533.75 ml    Last BM: none recorded   Labs:   Recent Labs Lab 01/22/13 0204 01/24/13 1755 01/25/13 0514  NA 137 138 139  K 3.6 3.3* 3.2*  CL 101 102 106  CO2 25 21 21   BUN 14 16 21   CREATININE 1.33* 1.70* 2.08*  CALCIUM 9.3 9.2 8.2*  GLUCOSE 122* 110* 85    CBG (last 3)  No results found for this basename: GLUCAP,  in the last 72 hours  Scheduled Meds: . antiseptic oral rinse  15 mL Mouth Rinse q12n4p  . carvedilol  12.5 mg Oral BID WC  . chlorhexidine  15 mL Mouth Rinse BID  . heparin  5,000 Units Subcutaneous Q8H  . [START ON 01/26/2013] influenza vac split quadrivalent PF  0.5 mL Intramuscular Tomorrow-1000  . metoCLOPramide  5 mg Oral TID AC  . pantoprazole  40 mg Oral Q1200    Continuous Infusions: . 0.9 % NaCl with KCl 20 mEq / L 75 mL/hr at 01/25/13 [id number]    Past Medical History  Diagnosis Date  . IUD     HISTORY OF IUD --REMOVED IN 2006  . Hypertension   . Anemia   . History of blood transfusion   . Anxiety   . Bipolar disorder   . Depression   . Seizures 2009,2012    two seizures due to a med  changes    Past Surgical History  Procedure Laterality Date  . Rotator cuff repair    . Gastric bypass  2000  . Hysteroscopy w/d&c  11/14/2011    Procedure: DILATATION AND CURETTAGE /HYSTEROSCOPY;  Surgeon: Marylynn Pearson, MD;  Location: [facility];  Service: Gynecology;;  with removal of polyps  . Cholecystectomy      Terrace Arabia RD, LDN

## 2013-01-25 NOTE — Progress Notes (Signed)
[facility] PROGRESS NOTE  [name] [id number] DOB: 09-21-1965 DOA: 01/24/2013 PCP: Maximino Greenland, MD  Assessment/Plan: 1-intractable nausea and vomiting: appears to be secondary to viral gastroenteritis. -continue reglan -continue PPI -continue PRN antiemetics -will advance diet slowly -hopefully home in am  2-Acute kidney injury:due to dehydration (pre-renal azotemia) and continue use of losartan -continue holding nephrotoxic agents -continue IVF's -BMET in am  3-hypokalemia: will replete electrolytes as needed  4-GERD: continue PPI  5-HTN: stable. Continue coreg   6-hx of anxiety:stable. If needed will use patient home dose of valium. Meanwhile will just monitor.  DVT: heparin  Code Status: Full Family Communication: no family at bedside  Disposition Plan: home when medically stable   Consultants:  None   Procedures:  See below for x-ray report  Antibiotics: None   HPI/Subjective: Feeling better, no further vomiting. Nausea much better. Willing to advance diet. afebrile  Objective: Filed Vitals:   01/25/13 1700  BP: 129/78  Pulse: 75  Temp:   Resp:     Intake/Output Summary (Last 24 hours) at 01/25/13 1759 Last data filed at 01/25/13 0900  Gross per 24 hour  Intake 533.75 ml  Output      0 ml  Net 533.75 ml   Filed Weights   01/24/13 1750 01/25/13 0028  Weight: 104.327 kg (230 lb) 92.2 kg (203 lb 4.2 oz)    Exam:   General:  Feeling better, reports nausea and vomiting subsided  Cardiovascular: S1 and S2, no rubs or gallops  Respiratory: CTA bilaterally  Abdomen: soft, mild epigastric tenderness, no rebound or guarding  Musculoskeletal: no edema or erythema  Data Reviewed: Basic Metabolic Panel:  Recent Labs Lab 01/22/13 0204 01/24/13 1755 01/25/13 0514  NA 137 138 139  K 3.6 3.3* 3.2*  CL 101 102 106  CO2 25 21 21   GLUCOSE 122* 110* 85  BUN 14 16 21   CREATININE 1.33* 1.70* 2.08*  CALCIUM 9.3 9.2 8.2*    Liver Function Tests:  Recent Labs Lab 01/22/13 0204 01/24/13 1755 01/25/13 0514  AST 23 22 17   ALT 20 16 12   ALKPHOS 145* 157* 119*  BILITOT 1.0 1.7* 1.4*  PROT 8.0 7.5 6.0  ALBUMIN 4.2 4.0 3.1*    Recent Labs Lab 01/22/13 0204 01/24/13 1755  LIPASE 67* 80*   CBC:  Recent Labs Lab 01/22/13 0204 01/24/13 1755 01/25/13 0514  WBC 9.6 9.9 8.5  NEUTROABS 7.8* 6.2  --   HGB 13.7 15.4* 12.1  HCT 40.2 46.3* 37.5  MCV 81.7 80.9 82.8  PLT 199 215 158      Studies: US Abdomen Complete  01/24/2013   CLINICAL DATA:  Abdominal pain  EXAM: ULTRASOUND ABDOMEN COMPLETE  COMPARISON:  None.  FINDINGS: Gallbladder  Surgically removed.  Common bile duct  Diameter: 6 mm.  Liver  No focal lesion identified. Within normal limits in parenchymal echogenicity.  IVC  No abnormality visualized.  Pancreas  Limited visualization due to overlying bowel gas.  Spleen  Size and appearance within normal limits.  Right Kidney  Length: 10.1 cm. Increased echogenicity is noted.  Left Kidney  Length: 9.5 cm. Increased echogenicity is noted.  Abdominal aorta  No aneurysm visualized.  IMPRESSION: Increased echogenicity likely related to medical renal disease. Correlation with laboratory values is recommended.  Status post cholecystectomy. The prominence of the common bile duct is likely related to the post cholecystectomy state.  No other focal abnormality is noted.   Electronically Signed   By: Inez Catalina  M.D.   On: 01/24/2013 20:26    Scheduled Meds: . antiseptic oral rinse  15 mL Mouth Rinse q12n4p  . carvedilol  12.5 mg Oral BID WC  . chlorhexidine  15 mL Mouth Rinse BID  . heparin  5,000 Units Subcutaneous Q8H  . [START ON 01/26/2013] influenza vac split quadrivalent PF  0.5 mL Intramuscular Tomorrow-1000  . metoCLOPramide  5 mg Oral TID AC  . pantoprazole  40 mg Oral Q1200   Continuous Infusions: . 0.9 % NaCl with KCl 20 mEq / L 75 mL/hr at 01/25/13 1653    Principal Problem:   Intractable  nausea and vomiting Active Problems:   Essential hypertension, benign   Hypokalemia   AKI (acute kidney injury)   Abdominal pain, unspecified site    Time spent: >30 minutes    Login [place]  [facility] Pager [phone]. If 7PM-7AM, please contact night-coverage at [email or web address], password [facility] 01/25/2013, 5:59 PM  LOS: 1 day

## 2013-01-26 DIAGNOSIS — E876 Hypokalemia: Secondary | ICD-10-CM

## 2013-01-26 LAB — BASIC METABOLIC PANEL
BUN: 22 mg/dL (ref 6–23)
CO2: 22 mEq/L (ref 19–32)
Calcium: 8.1 mg/dL — ABNORMAL LOW (ref 8.4–10.5)
Chloride: 107 mEq/L (ref 96–112)
Creatinine, Ser: 1.99 mg/dL — ABNORMAL HIGH (ref 0.50–1.10)
GFR calc Af Amer: 34 mL/min — ABNORMAL LOW (ref >90)
GFR calc non Af Amer: 29 mL/min — ABNORMAL LOW (ref >90)
Glucose, Bld: 78 mg/dL (ref 70–99)
Potassium: 3.7 mEq/L (ref 3.5–5.1)
Sodium: 138 mEq/L (ref 135–145)

## 2013-01-26 NOTE — Progress Notes (Signed)
[facility] PROGRESS NOTE  [name] [id number] DOB: 09/01/1965 DOA: 01/24/2013 PCP: Nicole Greenland, MD  Assessment/Plan: 1-intractable nausea and vomiting: appears to be secondary to viral gastroenteritis. -continue reglan -continue PPI -continue PRN antiemetics -will continue advancing diet slowly -hopefully home in am; patient feeling better and no more vomiting.  2-Acute kidney injury:due to dehydration (pre-renal azotemia) and continue use of losartan -continue holding nephrotoxic agents -continue IVF's; will increase rate -BMET in am -renal function slightly worse; will follow renal trend. Patient making good amount of urine  3-hypokalemia: will repleted  4-GERD: continue PPI  5-HTN: stable. Continue only coreg for now.  6-hx of anxiety: stable. If needed will use patient home dose of valium. Meanwhile will just monitor.  DVT: heparin  Code Status: Full Family Communication: no family at bedside  Disposition Plan: home when medically stable   Consultants:  None   Procedures:  See below for x-ray report  Antibiotics: None   HPI/Subjective: Feeling better, no vomiting. Tolerating diet and willing to advance to solid food. Renal function is worse, will hold discharge and continue IVF's.  Objective: Filed Vitals:   01/26/13 1652  BP: 146/94  Pulse: 73  Temp:   Resp:     Intake/Output Summary (Last 24 hours) at 01/26/13 2112 Last data filed at 01/26/13 0612  Gross per 24 hour  Intake    915 ml  Output      1 ml  Net    914 ml   Filed Weights   01/24/13 1750 01/25/13 0028  Weight: 104.327 kg (230 lb) 92.2 kg (203 lb 4.2 oz)    Exam:   General:  Feeling better, reports nausea and vomiting subsided  Cardiovascular: S1 and S2, no rubs or gallops  Respiratory: CTA bilaterally  Abdomen: soft, mild epigastric tenderness, no rebound or guarding  Musculoskeletal: no edema or erythema  Data Reviewed: Basic Metabolic  Panel:  Recent Labs Lab 01/22/13 0204 01/24/13 1755 01/25/13 0514 01/26/13 0425  NA 137 138 139 138  K 3.6 3.3* 3.2* 3.7  CL 101 102 106 107  CO2 25 21 21 22   GLUCOSE 122* 110* 85 78  BUN 14 16 21 22   CREATININE 1.33* 1.70* 2.08* 1.99*  CALCIUM 9.3 9.2 8.2* 8.1*   Liver Function Tests:  Recent Labs Lab 01/22/13 0204 01/24/13 1755 01/25/13 0514  AST 23 22 17   ALT 20 16 12   ALKPHOS 145* 157* 119*  BILITOT 1.0 1.7* 1.4*  PROT 8.0 7.5 6.0  ALBUMIN 4.2 4.0 3.1*    Recent Labs Lab 01/22/13 0204 01/24/13 1755  LIPASE 67* 80*   CBC:  Recent Labs Lab 01/22/13 0204 01/24/13 1755 01/25/13 0514  WBC 9.6 9.9 8.5  NEUTROABS 7.8* 6.2  --   HGB 13.7 15.4* 12.1  HCT 40.2 46.3* 37.5  MCV 81.7 80.9 82.8  PLT 199 215 158      Studies: No results found.  Scheduled Meds: . antiseptic oral rinse  15 mL Mouth Rinse q12n4p  . carvedilol  12.5 mg Oral BID WC  . chlorhexidine  15 mL Mouth Rinse BID  . heparin  5,000 Units Subcutaneous Q8H  . metoCLOPramide  5 mg Oral TID AC  . pantoprazole  40 mg Oral Q1200   Continuous Infusions: . 0.9 % NaCl with KCl 20 mEq / L 100 mL/hr at 01/26/13 1551    Principal Problem:   Intractable nausea and vomiting Active Problems:   Essential hypertension, benign   Hypokalemia   AKI (acute  kidney injury)   Abdominal pain, unspecified site    Time spent: < 30 minutes    [name]  [facility] Pager [phone]. If 7PM-7AM, please contact night-coverage at [email or web address], password [facility] 01/26/2013, 9:12 PM  LOS: 2 days

## 2013-01-26 NOTE — ED Provider Notes (Signed)
Medical screening examination/treatment/procedure(s) were performed by non-physician practitioner and as supervising physician I was immediately available for consultation/collaboration.  EKG Interpretation     Ventricular Rate:    PR Interval:    QRS Duration:   QT Interval:    QTC Calculation:   R Axis:     Text Interpretation:                Saddie Benders. Dellis Voght, MD 01/26/13 1032

## 2013-01-27 LAB — BASIC METABOLIC PANEL
BUN: 18 mg/dL (ref 6–23)
CO2: 19 mEq/L (ref 19–32)
Calcium: 8.6 mg/dL (ref 8.4–10.5)
Chloride: 109 mEq/L (ref 96–112)
Creatinine, Ser: 1.47 mg/dL — ABNORMAL HIGH (ref 0.50–1.10)
GFR calc Af Amer: 48 mL/min — ABNORMAL LOW (ref >90)
GFR calc non Af Amer: 42 mL/min — ABNORMAL LOW (ref >90)
Glucose, Bld: 84 mg/dL (ref 70–99)
Potassium: 4.7 mEq/L (ref 3.5–5.1)
Sodium: 139 mEq/L (ref 135–145)

## 2013-01-27 MED ORDER — ONDANSETRON 8 MG PO TBDP: 8.0000 mg | ORAL_TABLET | Freq: Three times a day (TID) | ORAL | Status: DC | PRN

## 2013-01-27 MED ORDER — ISOSORB DINITRATE-HYDRALAZINE 20-37.5 MG PO TABS: 1.0000 | ORAL_TABLET | Freq: Two times a day (BID) | ORAL | Status: DC

## 2013-01-27 MED ORDER — TRAMADOL HCL 50 MG PO TABS: 50.0000 mg | ORAL_TABLET | Freq: Four times a day (QID) | ORAL | Status: DC | PRN

## 2013-01-27 MED ORDER — METOCLOPRAMIDE HCL 5 MG PO TABS: 5.0000 mg | ORAL_TABLET | Freq: Three times a day (TID) | ORAL | Status: DC

## 2013-01-27 MED ORDER — ZOLPIDEM TARTRATE 5 MG PO TABS
5.0000 mg | ORAL_TABLET | Freq: Every evening | ORAL | Status: DC | PRN
  Administered 2013-01-27: 5 mg via ORAL
  Filled 2013-01-27: qty 1

## 2013-01-27 MED ORDER — CARVEDILOL 25 MG PO TABS: 25.0000 mg | ORAL_TABLET | Freq: Two times a day (BID) | ORAL | Status: DC

## 2013-01-27 MED ORDER — ESOMEPRAZOLE MAGNESIUM 40 MG PO CPDR: 40.0000 mg | DELAYED_RELEASE_CAPSULE | Freq: Two times a day (BID) | ORAL | Status: DC

## 2013-01-27 NOTE — Discharge Summary (Signed)
Physician Discharge Summary  [name] [id number] DOB: [birth year] DOA: 01/24/2013  PCP: Maximino Greenland, MD  Admit date: 01/24/2013 Discharge date: 01/27/2013  Time spent: >30 minutes  Recommendations for Outpatient Follow-up:  1. Reassess BP and adjust medications as needed 2. Arrange appointment with pain specialist for chronic management (patient reports pain is not well controlled and that current treatment is affecting her stomach) 3. Check BMET to follow electrolytes and renal function  Discharge Diagnoses:  Principal Problem:   Intractable nausea and vomiting Active Problems:   Essential hypertension, benign   Hypokalemia   AKI (acute kidney injury)   Abdominal pain, unspecified site Chronic pain  Anxiety/bipolar disorder GERD  Discharge Condition: stable and improved. No nausea, no vomiting and tolerating diet and medications by mouth. Will follow with GI in couple of days after discharge and with PCP in 1 week.  Diet recommendation: heart healthy, small meals and low acidic  Filed Weights   01/24/13 1750 01/25/13 0028  Weight: 104.327 kg (230 lb) 92.2 kg (203 lb 4.2 oz)    History of present illness:  47 y.o. female with Past medical history of anxiety, hypertension, cholecystectomy and gastric bypass.  She presented today with the complaint of nausea and vomiting that has been ongoing since last Saturday associated with abdominal pain which has been ongoing since last 2 days located in the right upper quadrant region. The pain she mentions slight crampy pain and she denies any similar pain in the past. She denies any diarrhea or active bleeding. She denies any burning urination. She denies any shortness of breath, chest pain, leg swelling, any rash anywhere.  She has been seen in the ER on 11/4 with similar complaints at that time her pain was improved with IV pain medication and nausea was also improved but today her symptoms are not improving.  She denies any  change in her diet or medications.   Hospital Course:  1-intractable nausea and vomiting: appears to be secondary to viral gastroenteritis vs gastritis vs gastroparesis as sequela of bypass surgery.  -continue reglan TID -continue PPI, no change to BID for better symptoms control -continue PRN antiemetics  -at discharge diet well tolerated; encourage to follow small meals multiple times a day and low acidic food. -will follow with GI specialist couple of days after discharge (had already schedule appointment); if symptoms persist will need EGD.  2-Acute kidney injury: due to dehydration (pre-renal azotemia) and continue use of losartan/lasix  -continue holding nephrotoxic agents  -advise to keep herself well hydrated -BMET in 1 week to follow renal function and electrolytes during PCP visit -renal function at discharge improved and trending down; Cr 1.4  3-Hypokalemia: will repleted   4-GERD: continue PPI; but change to BID for better control  5-HTN: stable. Continue only coreg and Bidil. Lasix and losartan discontinue due to renal insufficiency   6-hx of anxiety/bipolar disorder: stable.continue PRN valium and follow with Psych in outpatient therapy.  7-chronic pain: continue home regimen and will benefit of appointment with pain specialist.   Procedures: See below for x-ray reports  Consultations:  None   Discharge Exam: Filed Vitals:   01/27/13 0638  BP: 141/96  Pulse: 87  Temp: 97.3 F (36.3 C)  Resp: 18   General: Feeling better, reports nausea and vomiting subsided and she is tolerating heart healthy diet Cardiovascular: S1 and S2, no rubs or gallops  Respiratory: CTA bilaterally  Abdomen: soft, mild epigastric tenderness, no rebound or guarding  Musculoskeletal: no edema or  erythema  Discharge Instructions  Discharge Orders   Future Orders Complete By Expires   Discharge instructions  As directed    Comments:     Take medications as prescribed. Follow  with PCP in 10 days Follow with gastroenterology as prior to admission instructed Your Nexium has been changed to BID Follow a heart healthy diet and also low acidic food       Medication List    STOP taking these medications       amLODipine 10 MG tablet  Commonly known as:  NORVASC     furosemide 20 MG tablet  Commonly known as:  LASIX     losartan 25 MG tablet  Commonly known as:  COZAAR      TAKE these medications       carvedilol 25 MG tablet  Commonly known as:  COREG  Take 1 tablet (25 mg total) by mouth 2 (two) times daily with a meal.     diazepam 5 MG tablet  Commonly known as:  VALIUM  Take 5 mg by mouth every 6 (six) hours as needed (spasams).     dicyclomine 20 MG tablet  Commonly known as:  BENTYL  Take 1 tablet (20 mg total) by mouth every 6 (six) hours as needed (for abdominal cramping).     esomeprazole 40 MG capsule  Commonly known as:  NEXIUM  Take 1 capsule (40 mg total) by mouth 2 (two) times daily before a meal.     isosorbide-hydrALAZINE 20-37.5 MG per tablet  Commonly known as:  BIDIL  Take 1 tablet by mouth 2 (two) times daily.     lidocaine 5 %  Commonly known as:  LIDODERM  - Place 1 patch onto the skin daily as needed. For shoulder pain  - Remove & Discard patch within 12 hours or as directed by MD     metoCLOPramide 5 MG tablet  Commonly known as:  REGLAN  Take 1 tablet (5 mg total) by mouth 3 (three) times daily before meals.     ondansetron 8 MG disintegrating tablet  Commonly known as:  ZOFRAN ODT  Take 1 tablet (8 mg total) by mouth every 8 (eight) hours as needed for nausea.     promethazine 25 MG tablet  Commonly known as:  PHENERGAN  Take 1 tablet (25 mg total) by mouth every 6 (six) hours as needed for nausea.     promethazine 25 MG suppository  Commonly known as:  PHENERGAN  Place 1 suppository (25 mg total) rectally every 6 (six) hours as needed for nausea.     traMADol 50 MG tablet  Commonly known as:  ULTRAM   Take 1 tablet (50 mg total) by mouth every 6 (six) hours as needed.       Allergies  Allergen Reactions  . Sulfa Antibiotics Rash       Follow-up Information   Schedule an appointment as soon as possible for a visit with Maximino Greenland, MD.   Specialty:  Internal Medicine   Contact information:   [address] [place] [address] [phone]        The results of significant diagnostics from this hospitalization (including imaging, microbiology, ancillary and laboratory) are listed below for reference.    Significant Diagnostic Studies: US Abdomen Complete  01/24/2013   CLINICAL DATA:  Abdominal pain  EXAM: ULTRASOUND ABDOMEN COMPLETE  COMPARISON:  None.  FINDINGS: Gallbladder  Surgically removed.  Common bile duct  Diameter: 6 mm.  Liver  No focal lesion identified. Within normal limits in parenchymal echogenicity.  IVC  No abnormality visualized.  Pancreas  Limited visualization due to overlying bowel gas.  Spleen  Size and appearance within normal limits.  Right Kidney  Length: 10.1 cm. Increased echogenicity is noted.  Left Kidney  Length: 9.5 cm. Increased echogenicity is noted.  Abdominal aorta  No aneurysm visualized.  IMPRESSION: Increased echogenicity likely related to medical renal disease. Correlation with laboratory values is recommended.  Status post cholecystectomy. The prominence of the common bile duct is likely related to the post cholecystectomy state.  No other focal abnormality is noted.   Electronically Signed   By: Inez Catalina M.D.   On: 01/24/2013 20:26   Ct Abdomen Pelvis W Contrast  01/22/2013   CLINICAL DATA:  Right-sided abdominal pain  EXAM: CT ABDOMEN AND PELVIS WITH CONTRAST  TECHNIQUE: Multidetector CT imaging of the abdomen and pelvis was performed using the standard protocol following bolus administration of intravenous contrast.  CONTRAST:  141mL OMNIPAQUE IOHEXOL 300 MG/ML  SOLN  COMPARISON:  CT abdomen and pelvis 04/16/2012.   FINDINGS: Lung Bases: Unremarkable.  Abdomen/Pelvis: Postoperative changes in the upper, likely from prior gastric bypass surgery. Mild intrahepatic biliary ductal dilatation. Common bile duct is normal in caliber measuring 6 mm in the porta hepatis. Status post cholecystectomy. The appearance of the pancreas, spleen and bilateral adrenal glands is unremarkable. Mild multifocal cortical thinning in the kidneys bilaterally may indicate mild scarring. 2 mm nonobstructive calculus in the interpolar collecting system of the left kidney.  No significant volume of ascites. No pneumoperitoneum. No pathologic distention of small bowel. No definite lymphadenopathy identified within the abdomen or pelvis. Normal appendix. IUD present in the uterus. Uterus and ovaries otherwise unremarkable in appearance. Bladder is in appearance small ventral hernia containing a short segment of transverse colon, without evidence of incarceration or obstruction at this time.  Musculoskeletal: There are no aggressive appearing lytic or blastic lesions noted in the visualized portions of the skeleton.  IMPRESSION: 1. No acute findings in the abdomen or pelvis to account for the patient's symptoms. 2. 2 mm nonobstructive calculus in the interpolar collecting system of the left kidney. 3. Small ventral hernia containing a short segment of mid transverse colon, without evidence bowel incarceration obstruction at this time. 4. IUD in position in the uterus. 5. Appendix.   Electronically Signed   By: Vinnie Langton M.D.   On: 01/22/2013 04:37   Labs: Basic Metabolic Panel:  Recent Labs Lab 01/22/13 0204 01/24/13 1755 01/25/13 0514 01/26/13 0425 01/27/13 0652  NA 137 138 139 138 139  K 3.6 3.3* 3.2* 3.7 4.7  CL 101 102 106 107 109  CO2 25 21 21 22 19   GLUCOSE 122* 110* 85 78 84  BUN 14 16 21 22 18   CREATININE 1.33* 1.70* 2.08* 1.99* 1.47*  CALCIUM 9.3 9.2 8.2* 8.1* 8.6   Liver Function Tests:  Recent Labs Lab 01/22/13 0204  01/24/13 1755 01/25/13 0514  AST 23 22 17   ALT 20 16 12   ALKPHOS 145* 157* 119*  BILITOT 1.0 1.7* 1.4*  PROT 8.0 7.5 6.0  ALBUMIN 4.2 4.0 3.1*    Recent Labs Lab 01/22/13 0204 01/24/13 1755  LIPASE 67* 80*   CBC:  Recent Labs Lab 01/22/13 0204 01/24/13 1755 01/25/13 0514  WBC 9.6 9.9 8.5  NEUTROABS 7.8* 6.2  --   HGB 13.7 15.4* 12.1  HCT 40.2 46.3* 37.5  MCV 81.7 80.9 82.8  PLT  199 215 158    Signed:  Alyne Martinson  [facility] 01/27/2013, 10:42 AM

## 2013-01-27 NOTE — Progress Notes (Signed)
Patient discharge to home. Meds from home picked up from pharmacy and returned to patient. Verbalized discharge instructions.

## 2013-01-30 DIAGNOSIS — R1011 Right upper quadrant pain: Secondary | ICD-10-CM | POA: Diagnosis not present

## 2013-01-30 DIAGNOSIS — M940 Chondrocostal junction syndrome [Tietze]: Secondary | ICD-10-CM | POA: Diagnosis not present

## 2013-01-30 DIAGNOSIS — R1013 Epigastric pain: Secondary | ICD-10-CM | POA: Diagnosis not present

## 2013-01-30 DIAGNOSIS — R112 Nausea with vomiting, unspecified: Secondary | ICD-10-CM | POA: Diagnosis not present

## 2013-02-01 DIAGNOSIS — Z130 Encounter for screening for diseases of the blood and blood-forming organs and certain disorders involving the immune mechanism: Secondary | ICD-10-CM | POA: Diagnosis not present

## 2013-02-01 DIAGNOSIS — Z1231 Encounter for screening mammogram for malignant neoplasm of breast: Secondary | ICD-10-CM | POA: Diagnosis not present

## 2013-02-01 DIAGNOSIS — Z01419 Encounter for gynecological examination (general) (routine) without abnormal findings: Secondary | ICD-10-CM | POA: Diagnosis not present

## 2013-02-01 DIAGNOSIS — Z124 Encounter for screening for malignant neoplasm of cervix: Secondary | ICD-10-CM | POA: Diagnosis not present

## 2013-02-13 ENCOUNTER — Emergency Department (HOSPITAL_COMMUNITY)
Admission: EM | Admit: 2013-02-13 | Discharge: 2013-02-14 | Disposition: A | Discharge status: Home / Self Care | Payer: Medicare Other | Attending: Emergency Medicine | Admitting: Emergency Medicine

## 2013-02-13 ENCOUNTER — Encounter (HOSPITAL_COMMUNITY): Payer: Self-pay | Admitting: Emergency Medicine

## 2013-02-13 DIAGNOSIS — Z975 Presence of (intrauterine) contraceptive device: Secondary | ICD-10-CM | POA: Insufficient documentation

## 2013-02-13 DIAGNOSIS — Z8659 Personal history of other mental and behavioral disorders: Secondary | ICD-10-CM | POA: Insufficient documentation

## 2013-02-13 DIAGNOSIS — R112 Nausea with vomiting, unspecified: Secondary | ICD-10-CM | POA: Diagnosis not present

## 2013-02-13 DIAGNOSIS — K921 Melena: Secondary | ICD-10-CM | POA: Diagnosis not present

## 2013-02-13 DIAGNOSIS — Z87891 Personal history of nicotine dependence: Secondary | ICD-10-CM | POA: Insufficient documentation

## 2013-02-13 DIAGNOSIS — K2970 Gastritis, unspecified, without bleeding: Secondary | ICD-10-CM | POA: Diagnosis not present

## 2013-02-13 DIAGNOSIS — Z79899 Other long term (current) drug therapy: Secondary | ICD-10-CM | POA: Diagnosis not present

## 2013-02-13 DIAGNOSIS — I10 Essential (primary) hypertension: Secondary | ICD-10-CM | POA: Insufficient documentation

## 2013-02-13 DIAGNOSIS — R1084 Generalized abdominal pain: Secondary | ICD-10-CM | POA: Insufficient documentation

## 2013-02-13 DIAGNOSIS — Z862 Personal history of diseases of the blood and blood-forming organs and certain disorders involving the immune mechanism: Secondary | ICD-10-CM | POA: Insufficient documentation

## 2013-02-13 DIAGNOSIS — R197 Diarrhea, unspecified: Secondary | ICD-10-CM | POA: Diagnosis not present

## 2013-02-13 DIAGNOSIS — R1013 Epigastric pain: Secondary | ICD-10-CM | POA: Diagnosis not present

## 2013-02-13 DIAGNOSIS — R109 Unspecified abdominal pain: Secondary | ICD-10-CM

## 2013-02-13 DIAGNOSIS — Z8669 Personal history of other diseases of the nervous system and sense organs: Secondary | ICD-10-CM | POA: Diagnosis not present

## 2013-02-13 DIAGNOSIS — R1011 Right upper quadrant pain: Secondary | ICD-10-CM | POA: Diagnosis not present

## 2013-02-13 MED ORDER — MORPHINE SULFATE 4 MG/ML IJ SOLN
4.0000 mg | Freq: Once | INTRAMUSCULAR | Status: AC
  Administered 2013-02-14: 4 mg via INTRAVENOUS
  Filled 2013-02-13: qty 1

## 2013-02-13 MED ORDER — PROMETHAZINE HCL 25 MG/ML IJ SOLN
12.5000 mg | Freq: Once | INTRAMUSCULAR | Status: AC
  Administered 2013-02-14: 12.5 mg via INTRAVENOUS
  Filled 2013-02-13: qty 1

## 2013-02-13 MED ORDER — SODIUM CHLORIDE 0.9 % IV BOLUS (SEPSIS)
1000.0000 mL | Freq: Once | INTRAVENOUS | Status: AC
  Administered 2013-02-14: 1000 mL via INTRAVENOUS

## 2013-02-13 NOTE — ED Notes (Signed)
Bed: [id number] Expected date:  Expected time:  Means of arrival:  Comments: EMS/47 yo with nausea and vomiting

## 2013-02-13 NOTE — ED Provider Notes (Signed)
CSN: [id number]     Arrival date & time 02/13/13  2320 History   First MD Initiated Contact with Patient 02/13/13 2329    This chart was scribed for Antonietta Breach PA-C, a non-physician practitioner working with [name], MD by Denice Bors, ED Scribe. This patient was seen in room WA12/WA12 and the patient's care was started at 4:06 AM   Chief Complaint  Patient presents with  . Nausea  . Emesis  . Diarrhea   (Consider location/radiation/quality/duration/timing/severity/associated sxs/prior Treatment) The history is provided by the patient. No language interpreter was used.   HPI Comments: [name] is a 47 y.o. female who presents to the Emergency Department complaining of waxing and waning diffuse abdominal pain onset 09/14, which returned this morning. Describes pain as moderate in severity, sharp, aching, and without radiation. States it is primarily right sided. Reports pain is exacerbated by certain positions and alleviated by nothing. Reports trying Reglan with mild relief of symptoms. Reports 7 non-bloody episodes of vomiting today and last episode 1 hour ago. Reports associated nausea, vomiting and diarrhea; states some stools have been streaked with bright red blood. Denies associated fever, shortness of breath, chest pain, numbness, weakness, vaginal complaints and dysuria. Reported abdominal surgeries: cholecystectomy and gastric bypass.  Last endoscopy and colonoscopy was 2 years ago, which were normal. Followed by Dr. Benson Norway of GI. Patient admitted for same on 01/24/13 with dx of intractable N/V and dehydration. Patient states [name] has since followed up with Dr. Benson Norway with scheduled f/u next week.  Past Medical History  Diagnosis Date  . IUD     HISTORY OF IUD --REMOVED IN 2006  . Hypertension   . Anemia   . History of blood transfusion   . Anxiety   . Bipolar disorder   . Depression   . Seizures 2009,2012    two seizures due to a med changes   Past Surgical  History  Procedure Laterality Date  . Rotator cuff repair    . Gastric bypass  2000  . Hysteroscopy w/d&c  11/14/2011    Procedure: DILATATION AND CURETTAGE /HYSTEROSCOPY;  Surgeon: Marylynn Pearson, MD;  Location: [facility];  Service: Gynecology;;  with removal of polyps  . Cholecystectomy     Family History  Problem Relation Age of Onset  . Hypertension Mother   . Hypertension Father   . Diabetes Sister   . Hypertension Sister   . Hypertension Brother   . Hypertension Maternal Aunt   . Hypertension Maternal Uncle   . Hypertension Maternal Grandmother   . Hypertension Maternal Grandfather   . Hypertension Paternal Grandmother   . Hypertension Paternal Grandfather   . Diabetes Cousin    History  Substance Use Topics  . Smoking status: Former Smoker -- 0.00 packs/day    Types: Cigarettes    Quit date: 03/21/1988  . Smokeless tobacco: Never Used  . Alcohol Use: Yes     Comment: social   OB History   Grav Para Term Preterm Abortions TAB SAB Ect Mult Living   1    1  1    0     Review of Systems  Constitutional: Negative for fever.  Gastrointestinal: Positive for nausea, vomiting, abdominal pain, diarrhea and blood in stool.  Genitourinary: Negative for dysuria.  All other systems reviewed and are negative.  A complete 10 system review of systems was obtained and all systems are negative except as noted in the HPI and PMHx.   Allergies  Ondansetron and Sulfa  antibiotics  Home Medications   Current Outpatient Rx  Name  Route  Sig  Dispense  Refill  . carvedilol (COREG) 25 MG tablet   Oral   Take 25 mg by mouth 2 (two) times daily with a meal.         . dicyclomine (BENTYL) 20 MG tablet   Oral   Take 20 mg by mouth every 6 (six) hours as needed for spasms.         . Fish Oil OIL   Does not apply   1 capsule by Does not apply route daily.         . fluticasone (CUTIVATE) 0.05 % cream   Topical   Apply 1 application topically 2 (two) times daily as needed  (rash).          Marland Kitchen lidocaine (LIDODERM) 5 %   Transdermal   Place 1 patch onto the skin daily as needed. For shoulder pain Remove & Discard patch within 12 hours or as directed by MD         . methylPREDNISolone (MEDROL DOSEPAK) 4 MG tablet   Oral   Take 4 mg by mouth. follow package directions. Pt is on last day         . metoCLOPramide (REGLAN) 5 MG tablet   Oral   Take 5 mg by mouth 3 (three) times daily before meals.         . promethazine (PHENERGAN) 25 MG tablet   Oral   Take 25 mg by mouth every 6 (six) hours as needed for nausea or vomiting.         Marland Kitchen oxyCODONE-acetaminophen (PERCOCET/ROXICET) 5-325 MG per tablet   Oral   Take 1-2 tablets by mouth every 6 (six) hours as needed for severe pain.   15 tablet   0    BP 116/60  Pulse 73  Temp(Src) 98 F (36.7 C) (Oral)  Resp 16  SpO2 98%  Physical Exam  Nursing note and vitals reviewed. Constitutional: [name] is oriented to person, place, and time. [name] appears well-developed and well-nourished. No distress.  HENT:  Head: Normocephalic and atraumatic.  Mouth/Throat: Oropharynx is clear and moist. No oropharyngeal exudate.  Eyes: Conjunctivae and EOM are normal. Pupils are equal, round, and reactive to light. No scleral icterus.  Neck: Normal range of motion. Neck supple. No tracheal deviation present.  Cardiovascular: Normal rate, regular rhythm, normal heart sounds and intact distal pulses.   No murmur heard. Pulmonary/Chest: Effort normal and breath sounds normal. No respiratory distress. [name] has no wheezes. [name] has no rales.  Abdominal: Soft. Bowel sounds are normal. [name] exhibits no distension. There is tenderness. There is no rebound and no guarding.  Diffuse abdominal tenderness with mild focal tenderness in RUQ and epigastrium; No peritoneal signs or involuntary guarding. Abdomen soft.  Genitourinary:  Scant amount of brown stool appreciated on rectal exam without evidence of gross blood. No external or  internal hemorrhoids appreciated. No anal fissures.  Musculoskeletal: Normal range of motion.  Neurological: [name] is alert and oriented to person, place, and time.  Skin: Skin is warm and dry. No rash noted. [name] is not diaphoretic. No erythema. No pallor.  Psychiatric: [name] has a normal mood and affect. Her behavior is normal.    ED Course  Procedures (including critical care time)  COORDINATION OF CARE:  Nursing notes reviewed. Vital signs reviewed. Initial pt interview and examination performed.   4:06 AM-Discussed work up plan with pt at bedside, which  includes CBC with diff panel, BMP, UA, Lipase, and hemoccult. Pt agrees with plan.  Treatment plan initiated: Medications  HYDROmorphone (DILAUDID) injection 1 mg (0 mg Intravenous Hold 02/14/13 0353)  promethazine (PHENERGAN) injection 12.5 mg (12.5 mg Intravenous Given 02/14/13 0037)  morphine 4 MG/ML injection 4 mg (4 mg Intravenous Given 02/14/13 0040)  sodium chloride 0.9 % bolus 1,000 mL (1,000 mLs Intravenous New Bag/Given 02/14/13 0040)  metoCLOPramide (REGLAN) injection 10 mg (10 mg Intravenous Given 02/14/13 0310)   Initial diagnostic testing ordered.   Labs Review Labs Reviewed  BASIC METABOLIC PANEL - Abnormal; Notable for the following:    Sodium 134 (*)    Creatinine, Ser 1.36 (*)    GFR calc non Af Amer 46 (*)    GFR calc Af Amer 53 (*)    All other components within normal limits  URINALYSIS, ROUTINE W REFLEX MICROSCOPIC - Abnormal; Notable for the following:    APPearance HAZY (*)    Hgb urine dipstick TRACE (*)    Protein, ur 100 (*)    All other components within normal limits  LIPASE, BLOOD - Abnormal; Notable for the following:    Lipase 68 (*)    All other components within normal limits  CBC WITH DIFFERENTIAL - Abnormal; Notable for the following:    WBC 11.1 (*)    RDW 16.1 (*)    Neutro Abs 8.3 (*)    All other components within normal limits  URINE MICROSCOPIC-ADD ON  OCCULT BLOOD, POC DEVICE    Imaging Review No results found.  EKG Interpretation   None       MDM   1. Abdominal pain    50 her old female who presents for abdominal pain with associated nausea, vomiting, and diarrhea. Symptoms have been intermittent since September and patient denies any new associated or worsening symptoms. Patient well and nontoxic appearing on initial presentation. Physical exam significant for diffuse abdominal tenderness with mild focal tenderness in the right upper quadrant; however, patient with history of cholecystectomy. Labs today are consistent with prior ischemic urinalysis nonsuggestive of infection. Liver and kidney function preserved. Symptoms today managed with IVF, morphine, reglan, and phenergan with relief, per patient.   Over ED course, patient endorses much improved abdominal pain and nausea/emesis. Abdominal exams stable. Given chronicity of symptoms, hemodynamic stability, lack of fever, stable labs, and stable abdominal exams, do not believe emergent imaging is indicated. Patient states that [name] feels comfortable managing her pain further at home. Will prescribe Percocet for pain control and have advised followup with patient's gastroenterologist, Dr. Benson Norway. [name] is stable and appropriate for discharge with no unaddressed concerns. Patient seen also by Dr. Marnette Burgess who is in agreement with this workup, assessment, management plan, and patient's ability for discharge.  I personally performed the services described in this documentation, which was scribed in my presence. The recorded information has been reviewed and is accurate.     Antonietta Breach, PA-C 02/14/13 2010

## 2013-02-13 NOTE — ED Notes (Addendum)
Pt here with c/o NVD and left flank pain via EMS. Symptom have been going on since September and has began and worsened today. Patient has been worked up before for the same issue but problem has not resolved.

## 2013-02-14 LAB — URINALYSIS, ROUTINE W REFLEX MICROSCOPIC
Bilirubin Urine: NEGATIVE
Glucose, UA: NEGATIVE mg/dL
Ketones, ur: NEGATIVE mg/dL
Leukocytes, UA: NEGATIVE
Nitrite: NEGATIVE
Protein, ur: 100 mg/dL — AB
Specific Gravity, Urine: 1.016 (ref 1.005–1.030)
Urobilinogen, UA: 0.2 mg/dL (ref 0.0–1.0)
pH: 6.5 (ref 5.0–8.0)

## 2013-02-14 LAB — URINE MICROSCOPIC-ADD ON

## 2013-02-14 LAB — CBC WITH DIFFERENTIAL/PLATELET
Basophils Absolute: 0 10*3/uL (ref 0.0–0.1)
Basophils Relative: 0 % (ref 0–1)
Eosinophils Absolute: 0.1 10*3/uL (ref 0.0–0.7)
Eosinophils Relative: 1 % (ref 0–5)
HCT: 39 % (ref 36.0–46.0)
Hemoglobin: 12.7 g/dL (ref 12.0–15.0)
Lymphocytes Relative: 17 % (ref 12–46)
Lymphs Abs: 1.9 10*3/uL (ref 0.7–4.0)
MCH: 26.4 pg (ref 26.0–34.0)
MCHC: 32.6 g/dL (ref 30.0–36.0)
MCV: 81.1 fL (ref 78.0–100.0)
Monocytes Absolute: 0.8 10*3/uL (ref 0.1–1.0)
Monocytes Relative: 7 % (ref 3–12)
Neutro Abs: 8.3 10*3/uL — ABNORMAL HIGH (ref 1.7–7.7)
Neutrophils Relative %: 75 % (ref 43–77)
Platelets: 229 10*3/uL (ref 150–400)
RBC: 4.81 MIL/uL (ref 3.87–5.11)
RDW: 16.1 % — ABNORMAL HIGH (ref 11.5–15.5)
WBC: 11.1 10*3/uL — ABNORMAL HIGH (ref 4.0–10.5)

## 2013-02-14 LAB — BASIC METABOLIC PANEL
BUN: 16 mg/dL (ref 6–23)
CO2: 24 mEq/L (ref 19–32)
Calcium: 8.8 mg/dL (ref 8.4–10.5)
Chloride: 100 mEq/L (ref 96–112)
Creatinine, Ser: 1.36 mg/dL — ABNORMAL HIGH (ref 0.50–1.10)
GFR calc Af Amer: 53 mL/min — ABNORMAL LOW (ref >90)
GFR calc non Af Amer: 46 mL/min — ABNORMAL LOW (ref >90)
Glucose, Bld: 97 mg/dL (ref 70–99)
Potassium: 3.5 mEq/L (ref 3.5–5.1)
Sodium: 134 mEq/L — ABNORMAL LOW (ref 135–145)

## 2013-02-14 LAB — LIPASE, BLOOD: Lipase: 68 U/L — ABNORMAL HIGH (ref 11–59)

## 2013-02-14 LAB — OCCULT BLOOD, POC DEVICE: Fecal Occult Bld: NEGATIVE

## 2013-02-14 MED ORDER — HYDROMORPHONE HCL PF 1 MG/ML IJ SOLN
1.0000 mg | Freq: Once | INTRAMUSCULAR | Status: DC
  Filled 2013-02-14: qty 1

## 2013-02-14 MED ORDER — OXYCODONE-ACETAMINOPHEN 5-325 MG PO TABS: 1.0000 | ORAL_TABLET | Freq: Four times a day (QID) | ORAL | Status: DC | PRN

## 2013-02-14 MED ORDER — METOCLOPRAMIDE HCL 5 MG/ML IJ SOLN
10.0000 mg | Freq: Once | INTRAMUSCULAR | Status: AC
  Administered 2013-02-14: 10 mg via INTRAVENOUS
  Filled 2013-02-14: qty 2

## 2013-02-14 NOTE — ED Provider Notes (Signed)
Medical screening examination/treatment/procedure(s) were conducted as a shared visit with non-physician practitioner(s) and myself.  I personally evaluated the patient during the encounter.  Patient evaluated for nausea vomiting diarrhea with abdominal pain. After medications, symptomatically improved and on exam her abdomen is soft nontender nondistended without peritonitis. She feels better and is requesting to be discharged home. She agrees to followup as an outpatient with her gastroenterologist in addition to, her primary care doctor. She is a reliable historian states understanding all discharge and followup instructions. Return precautions provided and verbalizes understood.    [name], MD 02/14/13 2350

## 2013-03-01 DIAGNOSIS — M545 Low back pain, unspecified: Secondary | ICD-10-CM | POA: Diagnosis not present

## 2013-03-01 DIAGNOSIS — I10 Essential (primary) hypertension: Secondary | ICD-10-CM | POA: Diagnosis not present

## 2013-03-04 DIAGNOSIS — R112 Nausea with vomiting, unspecified: Secondary | ICD-10-CM | POA: Diagnosis not present

## 2013-04-25 ENCOUNTER — Encounter (HOSPITAL_COMMUNITY): Payer: Self-pay | Admitting: Emergency Medicine

## 2013-04-25 ENCOUNTER — Emergency Department (HOSPITAL_COMMUNITY)
Admission: EM | Admit: 2013-04-25 | Discharge: 2013-04-25 | Disposition: A | Discharge status: Home / Self Care | Payer: Medicare Other | Attending: Emergency Medicine | Admitting: Emergency Medicine

## 2013-04-25 ENCOUNTER — Emergency Department (HOSPITAL_COMMUNITY): Payer: Medicare Other

## 2013-04-25 DIAGNOSIS — Z8669 Personal history of other diseases of the nervous system and sense organs: Secondary | ICD-10-CM | POA: Diagnosis not present

## 2013-04-25 DIAGNOSIS — F319 Bipolar disorder, unspecified: Secondary | ICD-10-CM | POA: Diagnosis not present

## 2013-04-25 DIAGNOSIS — Z862 Personal history of diseases of the blood and blood-forming organs and certain disorders involving the immune mechanism: Secondary | ICD-10-CM | POA: Diagnosis not present

## 2013-04-25 DIAGNOSIS — Z3202 Encounter for pregnancy test, result negative: Secondary | ICD-10-CM | POA: Diagnosis not present

## 2013-04-25 DIAGNOSIS — Z791 Long term (current) use of non-steroidal anti-inflammatories (NSAID): Secondary | ICD-10-CM | POA: Insufficient documentation

## 2013-04-25 DIAGNOSIS — R110 Nausea: Secondary | ICD-10-CM | POA: Diagnosis not present

## 2013-04-25 DIAGNOSIS — Z9884 Bariatric surgery status: Secondary | ICD-10-CM | POA: Insufficient documentation

## 2013-04-25 DIAGNOSIS — I10 Essential (primary) hypertension: Secondary | ICD-10-CM | POA: Diagnosis not present

## 2013-04-25 DIAGNOSIS — Z87891 Personal history of nicotine dependence: Secondary | ICD-10-CM | POA: Insufficient documentation

## 2013-04-25 DIAGNOSIS — K2980 Duodenitis without bleeding: Secondary | ICD-10-CM | POA: Diagnosis not present

## 2013-04-25 DIAGNOSIS — Z79899 Other long term (current) drug therapy: Secondary | ICD-10-CM | POA: Insufficient documentation

## 2013-04-25 DIAGNOSIS — I498 Other specified cardiac arrhythmias: Secondary | ICD-10-CM | POA: Diagnosis not present

## 2013-04-25 DIAGNOSIS — Z975 Presence of (intrauterine) contraceptive device: Secondary | ICD-10-CM | POA: Insufficient documentation

## 2013-04-25 DIAGNOSIS — Z9089 Acquired absence of other organs: Secondary | ICD-10-CM | POA: Insufficient documentation

## 2013-04-25 LAB — COMPREHENSIVE METABOLIC PANEL
ALT: 10 U/L (ref 0–35)
AST: 18 U/L (ref 0–37)
Albumin: 4 g/dL (ref 3.5–5.2)
Alkaline Phosphatase: 144 U/L — ABNORMAL HIGH (ref 39–117)
BUN: 23 mg/dL (ref 6–23)
CO2: 18 mEq/L — ABNORMAL LOW (ref 19–32)
Calcium: 9.1 mg/dL (ref 8.4–10.5)
Chloride: 103 mEq/L (ref 96–112)
Creatinine, Ser: 1.33 mg/dL — ABNORMAL HIGH (ref 0.50–1.10)
GFR calc Af Amer: 54 mL/min — ABNORMAL LOW (ref >90)
GFR calc non Af Amer: 47 mL/min — ABNORMAL LOW (ref >90)
Glucose, Bld: 149 mg/dL — ABNORMAL HIGH (ref 70–99)
Potassium: 3.9 mEq/L (ref 3.7–5.3)
Sodium: 141 mEq/L (ref 137–147)
Total Bilirubin: 0.4 mg/dL (ref 0.3–1.2)
Total Protein: 7.7 g/dL (ref 6.0–8.3)

## 2013-04-25 LAB — POCT I-STAT TROPONIN I: Troponin i, poc: 0.02 ng/mL (ref 0.00–0.08)

## 2013-04-25 LAB — CBC WITH DIFFERENTIAL/PLATELET
Basophils Absolute: 0 10*3/uL (ref 0.0–0.1)
Basophils Relative: 0 % (ref 0–1)
Eosinophils Absolute: 0 10*3/uL (ref 0.0–0.7)
Eosinophils Relative: 0 % (ref 0–5)
HCT: 32.4 % — ABNORMAL LOW (ref 36.0–46.0)
Hemoglobin: 10.4 g/dL — ABNORMAL LOW (ref 12.0–15.0)
Lymphocytes Relative: 11 % — ABNORMAL LOW (ref 12–46)
Lymphs Abs: 1.1 10*3/uL (ref 0.7–4.0)
MCH: 25 pg — ABNORMAL LOW (ref 26.0–34.0)
MCHC: 32.1 g/dL (ref 30.0–36.0)
MCV: 77.9 fL — ABNORMAL LOW (ref 78.0–100.0)
Monocytes Absolute: 0.3 10*3/uL (ref 0.1–1.0)
Monocytes Relative: 3 % (ref 3–12)
Neutro Abs: 8.1 10*3/uL — ABNORMAL HIGH (ref 1.7–7.7)
Neutrophils Relative %: 86 % — ABNORMAL HIGH (ref 43–77)
Platelets: 348 10*3/uL (ref 150–400)
RBC: 4.16 MIL/uL (ref 3.87–5.11)
RDW: 15.9 % — ABNORMAL HIGH (ref 11.5–15.5)
WBC: 9.4 10*3/uL (ref 4.0–10.5)

## 2013-04-25 LAB — URINALYSIS, ROUTINE W REFLEX MICROSCOPIC
Bilirubin Urine: NEGATIVE
Glucose, UA: NEGATIVE mg/dL
Hgb urine dipstick: NEGATIVE
Ketones, ur: 15 mg/dL — AB
Leukocytes, UA: NEGATIVE
Nitrite: NEGATIVE
Protein, ur: 100 mg/dL — AB
Specific Gravity, Urine: 1.012 (ref 1.005–1.030)
Urobilinogen, UA: 0.2 mg/dL (ref 0.0–1.0)
pH: 5.5 (ref 5.0–8.0)

## 2013-04-25 LAB — POCT PREGNANCY, URINE: Preg Test, Ur: NEGATIVE

## 2013-04-25 LAB — LIPASE, BLOOD: Lipase: 67 U/L — ABNORMAL HIGH (ref 11–59)

## 2013-04-25 LAB — URINE MICROSCOPIC-ADD ON

## 2013-04-25 MED ORDER — SODIUM CHLORIDE 0.9 % IV BOLUS (SEPSIS)
1000.0000 mL | Freq: Once | INTRAVENOUS | Status: AC
  Administered 2013-04-25: 1000 mL via INTRAVENOUS

## 2013-04-25 MED ORDER — IOHEXOL 300 MG/ML  SOLN
25.0000 mL | INTRAMUSCULAR | Status: AC
  Administered 2013-04-25: 25 mL via ORAL

## 2013-04-25 MED ORDER — METOCLOPRAMIDE HCL 5 MG/ML IJ SOLN
10.0000 mg | Freq: Once | INTRAMUSCULAR | Status: AC
  Administered 2013-04-25: 10 mg via INTRAVENOUS
  Filled 2013-04-25: qty 2

## 2013-04-25 MED ORDER — MORPHINE SULFATE 4 MG/ML IJ SOLN
4.0000 mg | Freq: Once | INTRAMUSCULAR | Status: AC
  Administered 2013-04-25: 4 mg via INTRAVENOUS
  Filled 2013-04-25: qty 1

## 2013-04-25 MED ORDER — IOHEXOL 300 MG/ML  SOLN
80.0000 mL | Freq: Once | INTRAMUSCULAR | Status: AC | PRN
  Administered 2013-04-25: 80 mL via INTRAVENOUS

## 2013-04-25 MED ORDER — DIPHENHYDRAMINE HCL 50 MG/ML IJ SOLN
12.5000 mg | Freq: Once | INTRAMUSCULAR | Status: AC
  Administered 2013-04-25: 12.5 mg via INTRAVENOUS
  Filled 2013-04-25: qty 1

## 2013-04-25 NOTE — ED Provider Notes (Signed)
CSN: [id number]     Arrival date & time 04/25/13  0700 History   First MD Initiated Contact with Patient 04/25/13 [phone]     Chief Complaint  Patient presents with  . Abdominal Pain   (Consider location/radiation/quality/duration/timing/severity/associated sxs/prior Treatment) Patient is a 48 y.o. female presenting with abdominal pain. The history is provided by the patient. No language interpreter was used.  Abdominal Pain Pain location:  Periumbilical Associated symptoms: nausea   Associated symptoms: no chills, no diarrhea, no fever and no shortness of breath   Associated symptoms comment:  Recurrent generalized "moving" abdominal pain since yesterday, associated with nausea and vomiting. No fever. She has a history of similar symptoms in the past, multiple times, without firm diagnosis on evaluations. She is not having bloody emesis. She denies diarrhea.   Past Medical History  Diagnosis Date  . IUD     HISTORY OF IUD --REMOVED IN 2006  . Hypertension   . Anemia   . History of blood transfusion   . Anxiety   . Bipolar disorder   . Depression   . Seizures 2009,2012    two seizures due to a med changes   Past Surgical History  Procedure Laterality Date  . Rotator cuff repair    . Gastric bypass  2000  . Hysteroscopy w/d&c  11/14/2011    Procedure: DILATATION AND CURETTAGE /HYSTEROSCOPY;  Surgeon: Marylynn Pearson, MD;  Location: [facility];  Service: Gynecology;;  with removal of polyps  . Cholecystectomy     Family History  Problem Relation Age of Onset  . Hypertension Mother   . Hypertension Father   . Diabetes Sister   . Hypertension Sister   . Hypertension Brother   . Hypertension Maternal Aunt   . Hypertension Maternal Uncle   . Hypertension Maternal Grandmother   . Hypertension Maternal Grandfather   . Hypertension Paternal Grandmother   . Hypertension Paternal Grandfather   . Diabetes Cousin    History  Substance Use Topics  . Smoking status: Former Smoker --  0.00 packs/day    Types: Cigarettes    Quit date: 03/21/1988  . Smokeless tobacco: Never Used  . Alcohol Use: No     Comment: social   OB History   Grav Para Term Preterm Abortions TAB SAB Ect Mult Living   1    1  1    0     Review of Systems  Constitutional: Negative for fever and chills.  Respiratory: Negative.  Negative for shortness of breath.   Cardiovascular: Negative.   Gastrointestinal: Positive for nausea and abdominal pain. Negative for diarrhea and blood in stool.  Musculoskeletal: Negative.   Skin: Negative.   Neurological: Negative.     Allergies  Ondansetron and Sulfa antibiotics  Home Medications   Current Outpatient Rx  Name  Route  Sig  Dispense  Refill  . amLODipine (NORVASC) 10 MG tablet   Oral   Take 10 mg by mouth daily.         . carvedilol (COREG) 12.5 MG tablet   Oral   Take 12.5 mg by mouth 2 (two) times daily with a meal.         . dicyclomine (BENTYL) 20 MG tablet   Oral   Take 20 mg by mouth every 6 (six) hours as needed for spasms.         . furosemide (LASIX) 20 MG tablet   Oral   Take 20 mg by mouth daily.         Marland Kitchen  losartan (COZAAR) 25 MG tablet   Oral   Take 25 mg by mouth daily.         . naproxen (NAPROSYN) 500 MG tablet   Oral   Take 500 mg by mouth 2 (two) times daily with a meal.         . promethazine (PHENERGAN) 25 MG tablet   Oral   Take 25 mg by mouth every 6 (six) hours as needed for nausea or vomiting.         Marland Kitchen tiZANidine (ZANAFLEX) 4 MG tablet   Oral   Take 4 mg by mouth every 8 (eight) hours as needed for muscle spasms.         . traMADol (ULTRAM-ER) 200 MG 24 hr tablet   Oral   Take 200 mg by mouth daily.          BP 143/82  Pulse 104  Temp(Src) 97.7 F (36.5 C) (Oral)  Resp 18  SpO2 99% Physical Exam  Constitutional: She is oriented to person, place, and time. She appears well-developed and well-nourished.  HENT:  Head: Normocephalic.  Neck: Normal range of motion. Neck  supple.  Cardiovascular: Normal rate and regular rhythm.   Pulmonary/Chest: Effort normal. She has no wheezes. She has no rales.  Abdominal: Soft. Bowel sounds are normal. There is tenderness. There is no rebound and no guarding.  Periumbilical tenderness to soft abdomen.  Musculoskeletal: Normal range of motion.  Neurological: She is alert and oriented to person, place, and time.  Skin: Skin is warm and dry. No rash noted.  Psychiatric: She has a normal mood and affect.    ED Course  Procedures (including critical care time) Labs Review Labs Reviewed  CBC WITH DIFFERENTIAL - Abnormal; Notable for the following:    Hemoglobin 10.4 (*)    HCT 32.4 (*)    MCV 77.9 (*)    MCH 25.0 (*)    RDW 15.9 (*)    Neutrophils Relative % 86 (*)    Neutro Abs 8.1 (*)    Lymphocytes Relative 11 (*)    All other components within normal limits  COMPREHENSIVE METABOLIC PANEL - Abnormal; Notable for the following:    CO2 18 (*)    Glucose, Bld 149 (*)    Creatinine, Ser 1.33 (*)    Alkaline Phosphatase 144 (*)    GFR calc non Af Amer 47 (*)    GFR calc Af Amer 54 (*)    All other components within normal limits  LIPASE, BLOOD - Abnormal; Notable for the following:    Lipase 67 (*)    All other components within normal limits  URINALYSIS, ROUTINE W REFLEX MICROSCOPIC - Abnormal; Notable for the following:    Ketones, ur 15 (*)    Protein, ur 100 (*)    All other components within normal limits  URINE MICROSCOPIC-ADD ON - Abnormal; Notable for the following:    Casts HYALINE CASTS (*)    All other components within normal limits  POCT I-STAT TROPONIN I  POCT PREGNANCY, URINE   Results for orders placed during the hospital encounter of 04/25/13  CBC WITH DIFFERENTIAL      Result Value Range   WBC 9.4  4.0 - 10.5 K/uL   RBC 4.16  3.87 - 5.11 MIL/uL   Hemoglobin 10.4 (*) 12.0 - 15.0 g/dL   HCT 32.4 (*) 36.0 - 46.0 %   MCV 77.9 (*) 78.0 - 100.0 fL   MCH 25.0 (*) 26.0 -  34.0 pg   MCHC  32.1  30.0 - 36.0 g/dL   RDW 15.9 (*) 11.5 - 15.5 %   Platelets 348  150 - 400 K/uL   Neutrophils Relative % 86 (*) 43 - 77 %   Neutro Abs 8.1 (*) 1.7 - 7.7 K/uL   Lymphocytes Relative 11 (*) 12 - 46 %   Lymphs Abs 1.1  0.7 - 4.0 K/uL   Monocytes Relative 3  3 - 12 %   Monocytes Absolute 0.3  0.1 - 1.0 K/uL   Eosinophils Relative 0  0 - 5 %   Eosinophils Absolute 0.0  0.0 - 0.7 K/uL   Basophils Relative 0  0 - 1 %   Basophils Absolute 0.0  0.0 - 0.1 K/uL  COMPREHENSIVE METABOLIC PANEL      Result Value Range   Sodium 141  137 - 147 mEq/L   Potassium 3.9  3.7 - 5.3 mEq/L   Chloride 103  96 - 112 mEq/L   CO2 18 (*) 19 - 32 mEq/L   Glucose, Bld 149 (*) 70 - 99 mg/dL   BUN 23  6 - 23 mg/dL   Creatinine, Ser 1.33 (*) 0.50 - 1.10 mg/dL   Calcium 9.1  8.4 - 10.5 mg/dL   Total Protein 7.7  6.0 - 8.3 g/dL   Albumin 4.0  3.5 - 5.2 g/dL   AST 18  0 - 37 U/L   ALT 10  0 - 35 U/L   Alkaline Phosphatase 144 (*) 39 - 117 U/L   Total Bilirubin 0.4  0.3 - 1.2 mg/dL   GFR calc non Af Amer 47 (*) >90 mL/min   GFR calc Af Amer 54 (*) >90 mL/min  LIPASE, BLOOD      Result Value Range   Lipase 67 (*) 11 - 59 U/L  URINALYSIS, ROUTINE W REFLEX MICROSCOPIC      Result Value Range   Color, Urine YELLOW  YELLOW   APPearance CLEAR  CLEAR   Specific Gravity, Urine 1.012  1.005 - 1.030   pH 5.5  5.0 - 8.0   Glucose, UA NEGATIVE  NEGATIVE mg/dL   Hgb urine dipstick NEGATIVE  NEGATIVE   Bilirubin Urine NEGATIVE  NEGATIVE   Ketones, ur 15 (*) NEGATIVE mg/dL   Protein, ur 100 (*) NEGATIVE mg/dL   Urobilinogen, UA 0.2  0.0 - 1.0 mg/dL   Nitrite NEGATIVE  NEGATIVE   Leukocytes, UA NEGATIVE  NEGATIVE  URINE MICROSCOPIC-ADD ON      Result Value Range   Squamous Epithelial / LPF RARE  RARE   Casts HYALINE CASTS (*) NEGATIVE  POCT I-STAT TROPONIN I      Result Value Range   Troponin i, poc 0.02  0.00 - 0.08 ng/mL   Comment 3           POCT PREGNANCY, URINE      Result Value Range   Preg Test, Ur  NEGATIVE  NEGATIVE    Imaging Review Ct Abdomen Pelvis W Contrast  04/25/2013   CLINICAL DATA:  Generalized weakness and nausea, history of gastric bypass.  EXAM: CT ABDOMEN AND PELVIS WITH CONTRAST  TECHNIQUE: Multidetector CT imaging of the abdomen and pelvis was performed using the standard protocol following bolus administration of intravenous contrast.  CONTRAST:  84mL OMNIPAQUE IOHEXOL 300 MG/ML  SOLN  COMPARISON:  CT scan of the abdomen and pelvis dated January 22, 2013.  FINDINGS: The liver exhibits no focal mass. There is minimal stable intrahepatic  ductal dilation. The patient has undergone previous cholecystectomy and gastric bypass. The isolated portion of the stomach as well as the duodenum exhibit no acute abnormalities. Contrast has passed from the esophagus and gastric pouch into the jejunum. There are no findings suspicious for a leak.  There is mildly increased density within the fat adjacent to the pancreatic hand and proximal body. This may reflect low-grade inflammation. The pancreatic duct is faintly visible but stable. No pancreatic parenchymal calcifications or soft tissue masses are demonstrated. The spleen is not enlarged. The adrenal glands and abdominal aorta and kidneys exhibit no acute abnormalities. There is no hydronephrosis. No definite calcified stones are demonstrated. The caliber of the abdominal aorta is normal.  The moderate-sized ventral hernia is again demonstrated, and it contains a small portion of the left hepatic lobe as well as mesenteric fat but no bowel today. Inferior to this there is a tiny second midline ventral hernia containing fat. The partially contrast filled loops of small bowel exhibit no evidence of ileus or obstruction. There is a moderate stool burden within the colon without evidence of obstruction or acute inflammation. A normal appendix is demonstrated on images 56-62.  Within the pelvis there is no free fluid. An IUD is present within the uterus.  There are no adnexal masses. The partially distended urinary bladder is normal in appearance. The lung bases are clear.  IMPRESSION: 1. There is increased density in the periduodenal fat and the fat adjacent to the pancreatic head anteriorly and to the right. This may reflect inflammation either from pancreatitis or duodenitis. 2. There is no evidence of a small or large bowel obstruction. No incarcerated bowel loops within the ventral hernias are demonstrated. There is no evidence of a leak at the level of the gastric bypass. There is no evidence of enteritis or colitis. 3. There is no evidence of hydronephrosis nor findings to suggest pyelonephritis. No definite calcified urinary tract stones are demonstrated today.   Electronically Signed   By: David  Martinique   On: 04/25/2013 10:10    EKG Interpretation    Date/Time:  Thursday April 25 2013 07:43:33 EST Ventricular Rate:  117 PR Interval:  164 QRS Duration: 90 QT Interval:  379 QTC Calculation: 529 R Axis:   24 Text Interpretation:  Sinus tachycardia Borderline T abnormalities, diffuse leads Prolonged QT interval QT prolonged since previous Confirmed by YAO  MD, DAVID [phone]) on 04/25/2013 8:11:37 AM            MDM  No diagnosis found. 1. Duodenitis  She is feeling much better after medications and is tolerating PO fluids. She is improved enough and stable for discharge home with diagnosis of duodenitis. She has an already scheduled appointment with her GI doctor for Monday (in 4 days) and is given strict return precautions for the weekend.     Dewaine Oats, PA-C 04/26/13 [phone]

## 2013-04-25 NOTE — ED Notes (Signed)
Patient is alert and orientedx4.  Patient was explained discharge instructions and they understood them with no questions.  The patient's friend, Venia Carbon is coming to take her home.

## 2013-04-25 NOTE — Discharge Instructions (Signed)
Duodenitis Duodenitis is inflammation of the lining of the first part of your small intestine (duodenum). There are two types of duodenitis:  Acute duodenitis (develops suddenly and is short lived).   Chronic duodenitis (develops over an extended period and lasts months to years). CAUSES  Duodenitis is most often caused by infection with the bacterium Helicobacter pylori (H pylori). H pylori increases the production of stomach acid and causes changes in the environment of the duodenum. This irritates and damages the cells of the duodenum causing inflammation. Other causes of duodenitis include:   Long-term use of nonsteroidal anti-inflammatory drugs (NSAIDs). NSAIDs change the lining of the duodenum and make it more prone to injury from stomach acid.  Excessive use of alcohol. Alcohol increases stomach acid and changes the lining of the duodenum which makes it more likely for inflammation to develop.  Giardiasis. Giardiasis is a common infection of the small intestine. It can cause inflammation of the duodenum.   Other gastrointestinal disorders, such as Crohn disease. People with these disorders are more likely to develop duodenitis. SYMPTOMS  Although duodenitis does not always cause symptoms, symptoms that do occur include:  Nausea or vomiting.  Gassy, bloated feeling or an uncomfortable feeling of fullness after eating.  Burning, cramps, or pain in the upper abdominal area. DIAGNOSIS  To diagnose duodenitis, your caregiver may use results from:   An exam of the duodenum using a thin tube with a tiny camera on the tip, which is placed down your throat (endoscope). The endoscope is passed through your stomach and into your duodenum. Sometimes a sample of tissue from your duodenum is removed with the endoscope. The sample is then examined under a microscope (biopsy) for signs of inflammation and H pylori infection.   Tests that check samples of your blood or stool for H pylori  infection.   A test that checks the gases in a sample of your expired breath for H pylori infection. The test measures the levels of carbon dioxide in your breath after you drink a special solution.  An X-ray exam using a special liquid that you swallow to illuminate your digestive tract (barium) to show signs of inflammation. TREATMENT  Treatment will depend on the cause of the duodenitis. The most common treatments include:  Use of medication to treat infection.  Medication to reduce stomach acid.  Discontinuing the use of NSAIDs.  Management of other gastrointestinal conditions.  Avoiding alcohol consumption. Additionally, taking the following steps can help to reduce the severity of your symptoms:  Drink enough water to keep your urine clear or pale yellow.  Avoid consuming these foods or drinks:  Caffeinated drinks.  Chocolate.  Peppermint or mint-flavored food or drinks.  Garlic.  Onions.  Spicy foods.  Citrus fruits, such as oranges, lemons, or limes.  Foods that use tomato-based sauces, such as pasta sauce, chili, salsa, and pizza.  Fatty foods.  Fried foods. Document Released: 07/02/2012 Document Reviewed: 07/02/2012 [facility] Patient Information 2014 [place], [place].

## 2013-04-25 NOTE — ED Notes (Signed)
Pt reports weakness, nausea, generalized abdominal pain that began yesterday. Denies recent fever, sick contacts, diarrhea.

## 2013-04-26 DIAGNOSIS — M25519 Pain in unspecified shoulder: Secondary | ICD-10-CM | POA: Diagnosis not present

## 2013-04-26 DIAGNOSIS — I10 Essential (primary) hypertension: Secondary | ICD-10-CM | POA: Diagnosis not present

## 2013-04-29 DIAGNOSIS — R1033 Periumbilical pain: Secondary | ICD-10-CM | POA: Diagnosis not present

## 2013-04-29 DIAGNOSIS — D500 Iron deficiency anemia secondary to blood loss (chronic): Secondary | ICD-10-CM | POA: Diagnosis not present

## 2013-04-29 DIAGNOSIS — K449 Diaphragmatic hernia without obstruction or gangrene: Secondary | ICD-10-CM | POA: Diagnosis not present

## 2013-04-29 NOTE — ED Provider Notes (Signed)
Medical screening examination/treatment/procedure(s) were performed by non-physician practitioner and as supervising physician I was immediately available for consultation/collaboration.  EKG Interpretation    Date/Time:  Thursday April 25 2013 07:43:33 EST Ventricular Rate:  117 PR Interval:  164 QRS Duration: 90 QT Interval:  379 QTC Calculation: 529 R Axis:   24 Text Interpretation:  Sinus tachycardia Borderline T abnormalities, diffuse leads Prolonged QT interval QT prolonged since previous Confirmed by Johnson Arizola  MD, Fabyan Loughmiller [phone]) on 04/25/2013 8:11:37 AM              Wandra Arthurs, MD 04/29/13 1255

## 2013-04-30 ENCOUNTER — Emergency Department (HOSPITAL_COMMUNITY): Payer: Medicare Other

## 2013-04-30 ENCOUNTER — Emergency Department (HOSPITAL_COMMUNITY)
Admission: EM | Admit: 2013-04-30 | Discharge: 2013-04-30 | Disposition: A | Discharge status: Home / Self Care | Payer: Medicare Other | Attending: Emergency Medicine | Admitting: Emergency Medicine

## 2013-04-30 ENCOUNTER — Encounter (HOSPITAL_COMMUNITY): Payer: Self-pay | Admitting: Emergency Medicine

## 2013-04-30 DIAGNOSIS — Z975 Presence of (intrauterine) contraceptive device: Secondary | ICD-10-CM | POA: Insufficient documentation

## 2013-04-30 DIAGNOSIS — Z79899 Other long term (current) drug therapy: Secondary | ICD-10-CM | POA: Insufficient documentation

## 2013-04-30 DIAGNOSIS — Z9889 Other specified postprocedural states: Secondary | ICD-10-CM | POA: Diagnosis not present

## 2013-04-30 DIAGNOSIS — F319 Bipolar disorder, unspecified: Secondary | ICD-10-CM | POA: Insufficient documentation

## 2013-04-30 DIAGNOSIS — I10 Essential (primary) hypertension: Secondary | ICD-10-CM | POA: Diagnosis not present

## 2013-04-30 DIAGNOSIS — Z87891 Personal history of nicotine dependence: Secondary | ICD-10-CM | POA: Diagnosis not present

## 2013-04-30 DIAGNOSIS — Z862 Personal history of diseases of the blood and blood-forming organs and certain disorders involving the immune mechanism: Secondary | ICD-10-CM | POA: Diagnosis not present

## 2013-04-30 DIAGNOSIS — R1011 Right upper quadrant pain: Secondary | ICD-10-CM | POA: Insufficient documentation

## 2013-04-30 DIAGNOSIS — R112 Nausea with vomiting, unspecified: Secondary | ICD-10-CM | POA: Insufficient documentation

## 2013-04-30 DIAGNOSIS — R1013 Epigastric pain: Secondary | ICD-10-CM | POA: Insufficient documentation

## 2013-04-30 DIAGNOSIS — Z791 Long term (current) use of non-steroidal anti-inflammatories (NSAID): Secondary | ICD-10-CM | POA: Insufficient documentation

## 2013-04-30 DIAGNOSIS — Z9089 Acquired absence of other organs: Secondary | ICD-10-CM | POA: Diagnosis not present

## 2013-04-30 DIAGNOSIS — Z8669 Personal history of other diseases of the nervous system and sense organs: Secondary | ICD-10-CM | POA: Insufficient documentation

## 2013-04-30 DIAGNOSIS — Z9884 Bariatric surgery status: Secondary | ICD-10-CM | POA: Insufficient documentation

## 2013-04-30 DIAGNOSIS — F411 Generalized anxiety disorder: Secondary | ICD-10-CM | POA: Diagnosis not present

## 2013-04-30 DIAGNOSIS — R1110 Vomiting, unspecified: Secondary | ICD-10-CM | POA: Diagnosis not present

## 2013-04-30 DIAGNOSIS — R109 Unspecified abdominal pain: Secondary | ICD-10-CM

## 2013-04-30 LAB — URINALYSIS W MICROSCOPIC + REFLEX CULTURE
Bilirubin Urine: NEGATIVE
Glucose, UA: NEGATIVE mg/dL
Hgb urine dipstick: NEGATIVE
Ketones, ur: 15 mg/dL — AB
Leukocytes, UA: NEGATIVE
Nitrite: NEGATIVE
Protein, ur: 100 mg/dL — AB
Specific Gravity, Urine: 1.02 (ref 1.005–1.030)
Urobilinogen, UA: 0.2 mg/dL (ref 0.0–1.0)
pH: 6 (ref 5.0–8.0)

## 2013-04-30 LAB — CBC WITH DIFFERENTIAL/PLATELET
Basophils Absolute: 0 10*3/uL (ref 0.0–0.1)
Basophils Relative: 0 % (ref 0–1)
Eosinophils Absolute: 0.1 10*3/uL (ref 0.0–0.7)
Eosinophils Relative: 1 % (ref 0–5)
HCT: 31.3 % — ABNORMAL LOW (ref 36.0–46.0)
Hemoglobin: 10 g/dL — ABNORMAL LOW (ref 12.0–15.0)
Lymphocytes Relative: 11 % — ABNORMAL LOW (ref 12–46)
Lymphs Abs: 0.9 10*3/uL (ref 0.7–4.0)
MCH: 25 pg — ABNORMAL LOW (ref 26.0–34.0)
MCHC: 31.9 g/dL (ref 30.0–36.0)
MCV: 78.3 fL (ref 78.0–100.0)
Monocytes Absolute: 0.6 10*3/uL (ref 0.1–1.0)
Monocytes Relative: 7 % (ref 3–12)
Neutro Abs: 7.3 10*3/uL (ref 1.7–7.7)
Neutrophils Relative %: 82 % — ABNORMAL HIGH (ref 43–77)
Platelets: 331 10*3/uL (ref 150–400)
RBC: 4 MIL/uL (ref 3.87–5.11)
RDW: 15.9 % — ABNORMAL HIGH (ref 11.5–15.5)
WBC: 8.9 10*3/uL (ref 4.0–10.5)

## 2013-04-30 LAB — COMPREHENSIVE METABOLIC PANEL
ALT: 9 U/L (ref 0–35)
AST: 18 U/L (ref 0–37)
Albumin: 3.9 g/dL (ref 3.5–5.2)
Alkaline Phosphatase: 144 U/L — ABNORMAL HIGH (ref 39–117)
BUN: 20 mg/dL (ref 6–23)
CO2: 21 mEq/L (ref 19–32)
Calcium: 8.7 mg/dL (ref 8.4–10.5)
Chloride: 106 mEq/L (ref 96–112)
Creatinine, Ser: 1.42 mg/dL — ABNORMAL HIGH (ref 0.50–1.10)
GFR calc Af Amer: 50 mL/min — ABNORMAL LOW (ref >90)
GFR calc non Af Amer: 43 mL/min — ABNORMAL LOW (ref >90)
Glucose, Bld: 135 mg/dL — ABNORMAL HIGH (ref 70–99)
Potassium: 4.1 mEq/L (ref 3.7–5.3)
Sodium: 142 mEq/L (ref 137–147)
Total Bilirubin: 0.5 mg/dL (ref 0.3–1.2)
Total Protein: 7.5 g/dL (ref 6.0–8.3)

## 2013-04-30 LAB — LIPASE, BLOOD: Lipase: 38 U/L (ref 11–59)

## 2013-04-30 MED ORDER — MORPHINE SULFATE 4 MG/ML IJ SOLN
4.0000 mg | Freq: Once | INTRAMUSCULAR | Status: AC
  Administered 2013-04-30: 4 mg via INTRAVENOUS
  Filled 2013-04-30: qty 1

## 2013-04-30 MED ORDER — OXYCODONE-ACETAMINOPHEN 5-325 MG PO TABS: 1.0000 | ORAL_TABLET | ORAL | Status: DC | PRN

## 2013-04-30 MED ORDER — MORPHINE SULFATE 4 MG/ML IJ SOLN
6.0000 mg | Freq: Once | INTRAMUSCULAR | Status: AC
  Administered 2013-04-30: 6 mg via INTRAVENOUS
  Filled 2013-04-30: qty 2

## 2013-04-30 MED ORDER — FAMOTIDINE IN NACL 20-0.9 MG/50ML-% IV SOLN
20.0000 mg | Freq: Once | INTRAVENOUS | Status: AC
  Administered 2013-04-30: 20 mg via INTRAVENOUS
  Filled 2013-04-30: qty 50

## 2013-04-30 MED ORDER — GI COCKTAIL ~~LOC~~
30.0000 mL | Freq: Once | ORAL | Status: AC
  Administered 2013-04-30: 30 mL via ORAL
  Filled 2013-04-30: qty 30

## 2013-04-30 MED ORDER — METOCLOPRAMIDE HCL 10 MG PO TABS: 10.0000 mg | ORAL_TABLET | Freq: Four times a day (QID) | ORAL | Status: DC

## 2013-04-30 MED ORDER — SODIUM CHLORIDE 0.9 % IV BOLUS (SEPSIS)
1000.0000 mL | Freq: Once | INTRAVENOUS | Status: AC
  Administered 2013-04-30: 1000 mL via INTRAVENOUS

## 2013-04-30 MED ORDER — METOCLOPRAMIDE HCL 5 MG/ML IJ SOLN
10.0000 mg | Freq: Once | INTRAMUSCULAR | Status: AC
  Administered 2013-04-30: 10 mg via INTRAVENOUS
  Filled 2013-04-30: qty 2

## 2013-04-30 NOTE — Discharge Instructions (Signed)
Take pain medication and anti emetics as prescribed as needed. Stick with clear liquid diet today. Follow up with endoscopy as scheduled. Return if you are worsening.    Abdominal Pain, Adult Many things can cause abdominal pain. Usually, abdominal pain is not caused by a disease and will improve without treatment. It can often be observed and treated at home. Your health care provider will do a physical exam and possibly order blood tests and X-rays to help determine the seriousness of your pain. However, in many cases, more time must pass before a clear cause of the pain can be found. Before that point, your health care provider may not know if you need more testing or further treatment. HOME CARE INSTRUCTIONS  Monitor your abdominal pain for any changes. The following actions may help to alleviate any discomfort you are experiencing:  Only take over-the-counter or prescription medicines as directed by your health care provider.  Do not take laxatives unless directed to do so by your health care provider.  Try a clear liquid diet (broth, tea, or water) as directed by your health care provider. Slowly move to a bland diet as tolerated. SEEK MEDICAL CARE IF:  You have unexplained abdominal pain.  You have abdominal pain associated with nausea or diarrhea.  You have pain when you urinate or have a bowel movement.  You experience abdominal pain that wakes you in the night.  You have abdominal pain that is worsened or improved by eating food.  You have abdominal pain that is worsened with eating fatty foods. SEEK IMMEDIATE MEDICAL CARE IF:   Your pain does not go away within 2 hours.  You have a fever.  You keep throwing up (vomiting).  Your pain is felt only in portions of the abdomen, such as the right side or the left lower portion of the abdomen.  You pass bloody or black tarry stools. MAKE SURE YOU:  Understand these instructions.   Will watch your condition.   Will get  help right away if you are not doing well or get worse.  Document Released: 12/15/2004 Document Revised: 12/26/2012 Document Reviewed: 11/14/2012 [facility] Patient Information 2014 [place].

## 2013-04-30 NOTE — ED Provider Notes (Signed)
CSN: [id number]     Arrival date & time 04/30/13  [id number] History   First MD Initiated Contact with Patient 04/30/13 [id number]     Chief Complaint  Patient presents with  . Nausea  . Abdominal Pain     (Consider location/radiation/quality/duration/timing/severity/associated sxs/prior Treatment) HPI [name] is a 48 y.o. female who presents to emergency department complaining of abdominal pain, nausea, vomiting. Patient states that her symptoms all began approximately 5 months ago. She started having upper abdominal pains which are associated nausea vomiting. Patient has been to the emergency department multiple times for this and has been seen by her doctor. She states that she was not given a diagnosis. 5 days ago, patient was in emergency department had a CT scan of her abdomen show which showed duodenitis versus pancreatitis. Her labs are unremarkable and she felt better and was discharged home. Patient followed up with her gastroenterologist yesterday, Dr. Thornton Park. Dr. Benson Norway scheduled her for an endoscopy in 2 days from today. Patient states she's been treated with Bentyl and Phenergan for her symptoms. She states it is not helping. Patient's also been taking hydrocodone with no relief. She states pain worsened yesterday. States pain is above her umbilicus and in epigastric area. Pain radiates around right flank to the back. She states is worsened with palpation. Associated with nausea. She has not eaten anything since yesterday afternoon due to nausea. She states she had several episodes of vomiting this morning. She denies any loose bowels or diarrhea. She denies any blood in her stool. She does have history of gastric bypass in 2000. She has not had any major issues with that since. She denies taking any for pain this morning, she states she took Phenergan prior to coming in.  Past Medical History  Diagnosis Date  . IUD     HISTORY OF IUD --REMOVED IN 2006  . Hypertension   . Anemia   .  History of blood transfusion   . Anxiety   . Bipolar disorder   . Depression   . Seizures 2009,2012    two seizures due to a med changes   Past Surgical History  Procedure Laterality Date  . Rotator cuff repair    . Gastric bypass  2000  . Hysteroscopy w/d&c  11/14/2011    Procedure: DILATATION AND CURETTAGE /HYSTEROSCOPY;  Surgeon: Marylynn Pearson, MD;  Location: [facility];  Service: Gynecology;;  with removal of polyps  . Cholecystectomy     Family History  Problem Relation Age of Onset  . Hypertension Mother   . Hypertension Father   . Diabetes Sister   . Hypertension Sister   . Hypertension Brother   . Hypertension Maternal Aunt   . Hypertension Maternal Uncle   . Hypertension Maternal Grandmother   . Hypertension Maternal Grandfather   . Hypertension Paternal Grandmother   . Hypertension Paternal Grandfather   . Diabetes Cousin    History  Substance Use Topics  . Smoking status: Former Smoker -- 0.00 packs/day    Types: Cigarettes    Quit date: 03/21/1988  . Smokeless tobacco: Never Used  . Alcohol Use: No     Comment: social   OB History   Grav Para Term Preterm Abortions TAB SAB Ect Mult Living   1    1  1    0     Review of Systems  Constitutional: Negative for fever and chills.  Respiratory: Negative for cough, chest tightness and shortness of breath.   Cardiovascular: Negative for  chest pain, palpitations and leg swelling.  Gastrointestinal: Positive for nausea, vomiting and abdominal pain. Negative for diarrhea.  Genitourinary: Negative for dysuria, flank pain, vaginal bleeding, vaginal discharge, vaginal pain and pelvic pain.  Musculoskeletal: Negative for arthralgias, myalgias, neck pain and neck stiffness.  Skin: Negative for rash.  Neurological: Negative for dizziness, weakness and headaches.  All other systems reviewed and are negative.      Allergies  Ondansetron and Sulfa antibiotics  Home Medications   Current Outpatient Rx  Name  Route   Sig  Dispense  Refill  . amLODipine (NORVASC) 10 MG tablet   Oral   Take 10 mg by mouth daily.         . carvedilol (COREG) 12.5 MG tablet   Oral   Take 12.5 mg by mouth 2 (two) times daily with a meal.         . dicyclomine (BENTYL) 20 MG tablet   Oral   Take 20 mg by mouth every 6 (six) hours as needed for spasms.         . furosemide (LASIX) 20 MG tablet   Oral   Take 20 mg by mouth daily.         Marland Kitchen HYDROcodone-acetaminophen (NORCO/VICODIN) 5-325 MG per tablet   Oral   Take 1 tablet by mouth every 6 (six) hours as needed for moderate pain.         Marland Kitchen losartan (COZAAR) 25 MG tablet   Oral   Take 25 mg by mouth daily.         . naproxen (NAPROSYN) 500 MG tablet   Oral   Take 500 mg by mouth 2 (two) times daily with a meal.         . promethazine (PHENERGAN) 25 MG tablet   Oral   Take 25 mg by mouth every 6 (six) hours as needed for nausea or vomiting.         Marland Kitchen tiZANidine (ZANAFLEX) 4 MG tablet   Oral   Take 4 mg by mouth every 8 (eight) hours as needed for muscle spasms.         . traMADol (ULTRAM-ER) 200 MG 24 hr tablet   Oral   Take 200 mg by mouth daily.          BP 148/101  Pulse 103  Temp(Src) 98.1 F (36.7 C)  Resp 20  Wt 199 lb (90.266 kg)  SpO2 100% Physical Exam  Nursing note and vitals reviewed. Constitutional: She appears well-developed and well-nourished. No distress.  HENT:  Head: Normocephalic.  Eyes: Conjunctivae are normal.  Neck: Neck supple.  Cardiovascular: Normal rate, regular rhythm and normal heart sounds.   Pulmonary/Chest: Effort normal and breath sounds normal. No respiratory distress. She has no wheezes. She has no rales.  Abdominal: Soft. Bowel sounds are normal. She exhibits no distension. There is tenderness. There is no rebound.  Epigastric and RUQ tenderness  Musculoskeletal: She exhibits no edema.  Neurological: She is alert.  Skin: Skin is warm and dry.  Psychiatric: She has a normal mood and affect.  Her behavior is normal.    ED Course  Procedures (including critical care time) Labs Review Labs Reviewed  CBC WITH DIFFERENTIAL - Abnormal; Notable for the following:    Hemoglobin 10.0 (*)    HCT 31.3 (*)    MCH 25.0 (*)    RDW 15.9 (*)    Neutrophils Relative % 82 (*)    Lymphocytes Relative 11 (*)  All other components within normal limits  COMPREHENSIVE METABOLIC PANEL - Abnormal; Notable for the following:    Glucose, Bld 135 (*)    Creatinine, Ser 1.42 (*)    Alkaline Phosphatase 144 (*)    GFR calc non Af Amer 43 (*)    GFR calc Af Amer 50 (*)    All other components within normal limits  URINALYSIS W MICROSCOPIC + REFLEX CULTURE - Abnormal; Notable for the following:    APPearance HAZY (*)    Ketones, ur 15 (*)    Protein, ur 100 (*)    Bacteria, UA FEW (*)    Squamous Epithelial / LPF FEW (*)    All other components within normal limits  LIPASE, BLOOD   Imaging Review Dg Abd Acute W/chest  04/30/2013   CLINICAL DATA:  Abdominal pain and vomiting  EXAM: ACUTE ABDOMEN SERIES (ABDOMEN 2 VIEW & CHEST 1 VIEW)  COMPARISON:  Chest radiograph October 18, 2009; CT abdomen and pelvis April 25, 2013  FINDINGS: PA chest: Lungs are clear. Heart size and pulmonary vascularity are normal. No adenopathy.  Supine and upright abdomen: The the bowel gas pattern is unremarkable. No obstruction or free air. There is postoperative change in the right upper abdomen and in the proximal stomach region. There is an intrauterine device in the mid-pelvis. There are apparent phleboliths in the pelvis.  IMPRESSION: Bowel gas pattern unremarkable. Intrauterine device in mid pelvis. Lungs clear.   Electronically Signed   By: Lowella Grip M.D.   On: 04/30/2013 10:08    EKG Interpretation   None       MDM   Final diagnoses:  Abdominal pain  Nausea & vomiting    9:10 AM  Patient with intermittent abdominal pain nausea vomiting for 5 months now. Her last CT was 5 days ago and showed  duodenitis versus pancreatitis. She did see her gastroenterologist yesterday and is scheduled for an endoscopy in 2 days. Patient states her symptoms are not controlled at home. She is actively vomiting and ED. Fluids and medications ordered for symptoms. Labs are ordered.  1:05 PM Patient is feeling better now. Have given her option of staying in the hospital for intractable pain versus being discharged home with pain medication and anti-emetics. Patient chose to be going home. She stated that she thinks she can manage her pain at home if she can have some pain medicine. She does have an endoscopy scheduled in 2 days from today. She is instructed to stick with clear liquids today, take medications, followup as scheduled. If her symptoms worsen to return to emergency department.  Medications  sodium chloride 0.9 % bolus 1,000 mL (1,000 mLs Intravenous New Bag/Given 04/30/13 0917)  metoCLOPramide (REGLAN) injection 10 mg (10 mg Intravenous Given 04/30/13 0918)  morphine 4 MG/ML injection 4 mg (4 mg Intravenous Given 04/30/13 0918)  gi cocktail (Maalox,Lidocaine,Donnatal) (30 mLs Oral Given 04/30/13 0917)  morphine 4 MG/ML injection 6 mg (6 mg Intravenous Given 04/30/13 1134)  famotidine (PEPCID) IVPB 20 mg (20 mg Intravenous New Bag/Given 04/30/13 1229)     Filed Vitals:   04/30/13 1128 04/30/13 1130 04/30/13 1200 04/30/13 1230  BP: 150/90 141/93 122/79 118/72  Pulse: 94 98 95 90  Temp:      Resp:   16 16  Weight:      SpO2: 99% 99% 99% 99%     Nisreen Guise A Mallika Sanmiguel, PA-C 04/30/13 1606

## 2013-04-30 NOTE — ED Notes (Signed)
Nausea back pain and abd pain since sept worse the last 2 days feels weak

## 2013-05-02 DIAGNOSIS — K259 Gastric ulcer, unspecified as acute or chronic, without hemorrhage or perforation: Secondary | ICD-10-CM | POA: Diagnosis not present

## 2013-05-02 DIAGNOSIS — K449 Diaphragmatic hernia without obstruction or gangrene: Secondary | ICD-10-CM | POA: Diagnosis not present

## 2013-05-02 DIAGNOSIS — R1013 Epigastric pain: Secondary | ICD-10-CM | POA: Diagnosis not present

## 2013-05-03 NOTE — ED Provider Notes (Signed)
  This was a shared visit with a mid-level provided (NP or PA).  Throughout the patient's course I was available for consultation/collaboration.  I saw the ECG (if appropriate), relevant labs and studies - I agree with the interpretation.  On my exam the patient was uncomfortable appearing, though awake, alert, ambulatory.  She had a prolonged course in the ED, and although she continued to have discomfort, there was no decompensation, and she chose to continue eval as an outpatient (at a scheduled GI visit in two days.)      Carmin Muskrat, MD 05/03/13 1510

## 2013-05-27 DIAGNOSIS — K5900 Constipation, unspecified: Secondary | ICD-10-CM | POA: Diagnosis not present

## 2013-05-27 DIAGNOSIS — R1033 Periumbilical pain: Secondary | ICD-10-CM | POA: Diagnosis not present

## 2013-07-24 DIAGNOSIS — L299 Pruritus, unspecified: Secondary | ICD-10-CM | POA: Diagnosis not present

## 2013-07-24 DIAGNOSIS — D649 Anemia, unspecified: Secondary | ICD-10-CM | POA: Diagnosis not present

## 2013-07-24 DIAGNOSIS — N183 Chronic kidney disease, stage 3 unspecified: Secondary | ICD-10-CM | POA: Diagnosis not present

## 2013-07-24 DIAGNOSIS — I129 Hypertensive chronic kidney disease with stage 1 through stage 4 chronic kidney disease, or unspecified chronic kidney disease: Secondary | ICD-10-CM | POA: Diagnosis not present

## 2013-07-24 DIAGNOSIS — Z79899 Other long term (current) drug therapy: Secondary | ICD-10-CM | POA: Diagnosis not present

## 2013-07-24 DIAGNOSIS — R7309 Other abnormal glucose: Secondary | ICD-10-CM | POA: Diagnosis not present

## 2013-07-24 DIAGNOSIS — G4700 Insomnia, unspecified: Secondary | ICD-10-CM | POA: Diagnosis not present

## 2013-08-16 DIAGNOSIS — L301 Dyshidrosis [pompholyx]: Secondary | ICD-10-CM | POA: Diagnosis not present

## 2013-08-16 DIAGNOSIS — B86 Scabies: Secondary | ICD-10-CM | POA: Diagnosis not present

## 2013-11-22 DIAGNOSIS — I129 Hypertensive chronic kidney disease with stage 1 through stage 4 chronic kidney disease, or unspecified chronic kidney disease: Secondary | ICD-10-CM | POA: Diagnosis not present

## 2013-11-22 DIAGNOSIS — N183 Chronic kidney disease, stage 3 unspecified: Secondary | ICD-10-CM | POA: Diagnosis not present

## 2013-11-22 DIAGNOSIS — R55 Syncope and collapse: Secondary | ICD-10-CM | POA: Diagnosis not present

## 2013-11-22 DIAGNOSIS — G238 Other specified degenerative diseases of basal ganglia: Secondary | ICD-10-CM | POA: Diagnosis not present

## 2013-11-22 DIAGNOSIS — D500 Iron deficiency anemia secondary to blood loss (chronic): Secondary | ICD-10-CM | POA: Diagnosis not present

## 2013-11-28 DIAGNOSIS — K3189 Other diseases of stomach and duodenum: Secondary | ICD-10-CM | POA: Diagnosis not present

## 2013-11-28 DIAGNOSIS — I10 Essential (primary) hypertension: Secondary | ICD-10-CM | POA: Diagnosis not present

## 2013-11-28 DIAGNOSIS — E8881 Metabolic syndrome: Secondary | ICD-10-CM | POA: Diagnosis not present

## 2013-11-28 DIAGNOSIS — N183 Chronic kidney disease, stage 3 unspecified: Secondary | ICD-10-CM | POA: Diagnosis not present

## 2013-11-28 DIAGNOSIS — R7309 Other abnormal glucose: Secondary | ICD-10-CM | POA: Diagnosis not present

## 2013-11-28 DIAGNOSIS — I129 Hypertensive chronic kidney disease with stage 1 through stage 4 chronic kidney disease, or unspecified chronic kidney disease: Secondary | ICD-10-CM | POA: Diagnosis not present

## 2013-11-28 DIAGNOSIS — D509 Iron deficiency anemia, unspecified: Secondary | ICD-10-CM | POA: Diagnosis not present

## 2013-11-28 DIAGNOSIS — D518 Other vitamin B12 deficiency anemias: Secondary | ICD-10-CM | POA: Diagnosis not present

## 2013-12-04 ENCOUNTER — Telehealth: Payer: Self-pay | Admitting: Internal Medicine

## 2013-12-04 NOTE — Telephone Encounter (Signed)
S/W PT IN REF TO NP APPT. ON 01/06/14@1 :45 REFERRING DR ODEM DX- DEFICIENCY ANEMIA

## 2013-12-04 NOTE — Telephone Encounter (Signed)
C/D 12/04/13 for appt. 01/06/14

## 2013-12-09 DIAGNOSIS — D500 Iron deficiency anemia secondary to blood loss (chronic): Secondary | ICD-10-CM | POA: Diagnosis not present

## 2013-12-09 DIAGNOSIS — E538 Deficiency of other specified B group vitamins: Secondary | ICD-10-CM | POA: Diagnosis not present

## 2014-01-03 DIAGNOSIS — I10 Essential (primary) hypertension: Secondary | ICD-10-CM | POA: Diagnosis not present

## 2014-01-03 DIAGNOSIS — R55 Syncope and collapse: Secondary | ICD-10-CM | POA: Diagnosis not present

## 2014-01-03 DIAGNOSIS — M25511 Pain in right shoulder: Secondary | ICD-10-CM | POA: Diagnosis not present

## 2014-01-06 ENCOUNTER — Encounter: Payer: Self-pay | Admitting: Internal Medicine

## 2014-01-06 ENCOUNTER — Other Ambulatory Visit (HOSPITAL_BASED_OUTPATIENT_CLINIC_OR_DEPARTMENT_OTHER): Payer: Medicare Other

## 2014-01-06 ENCOUNTER — Ambulatory Visit: Payer: Medicare Other

## 2014-01-06 ENCOUNTER — Ambulatory Visit (HOSPITAL_BASED_OUTPATIENT_CLINIC_OR_DEPARTMENT_OTHER): Payer: Medicare Other | Admitting: Internal Medicine

## 2014-01-06 ENCOUNTER — Other Ambulatory Visit: Payer: Self-pay | Admitting: *Deleted

## 2014-01-06 ENCOUNTER — Other Ambulatory Visit: Payer: Self-pay | Admitting: Internal Medicine

## 2014-01-06 ENCOUNTER — Telehealth: Payer: Self-pay | Admitting: Internal Medicine

## 2014-01-06 VITALS — BP 111/70 | HR 86 | Temp 98.5°F | Resp 19 | Ht 61.0 in | Wt 242.5 lb

## 2014-01-06 DIAGNOSIS — N189 Chronic kidney disease, unspecified: Secondary | ICD-10-CM

## 2014-01-06 DIAGNOSIS — D509 Iron deficiency anemia, unspecified: Secondary | ICD-10-CM | POA: Insufficient documentation

## 2014-01-06 DIAGNOSIS — D631 Anemia in chronic kidney disease: Secondary | ICD-10-CM | POA: Diagnosis not present

## 2014-01-06 DIAGNOSIS — D518 Other vitamin B12 deficiency anemias: Secondary | ICD-10-CM | POA: Diagnosis not present

## 2014-01-06 DIAGNOSIS — D539 Nutritional anemia, unspecified: Secondary | ICD-10-CM | POA: Diagnosis not present

## 2014-01-06 DIAGNOSIS — D649 Anemia, unspecified: Secondary | ICD-10-CM

## 2014-01-06 LAB — IRON AND TIBC CHCC
%SAT: 8 % — ABNORMAL LOW (ref 21–57)
Iron: 31 ug/dL — ABNORMAL LOW (ref 41–142)
TIBC: 389 ug/dL (ref 236–444)
UIBC: 358 ug/dL (ref 120–384)

## 2014-01-06 LAB — CBC & DIFF AND RETIC
BASO%: 0.5 % (ref 0.0–2.0)
Basophils Absolute: 0 10*3/uL (ref 0.0–0.1)
EOS%: 8.6 % — ABNORMAL HIGH (ref 0.0–7.0)
Eosinophils Absolute: 0.5 10*3/uL (ref 0.0–0.5)
HCT: 29.1 % — ABNORMAL LOW (ref 34.8–46.6)
HGB: 8.3 g/dL — ABNORMAL LOW (ref 11.6–15.9)
Immature Retic Fract: 21.9 % — ABNORMAL HIGH (ref 1.60–10.00)
LYMPH%: 25.5 % (ref 14.0–49.7)
MCH: 20.1 pg — ABNORMAL LOW (ref 25.1–34.0)
MCHC: 28.3 g/dL — ABNORMAL LOW (ref 31.5–36.0)
MCV: 70.8 fL — ABNORMAL LOW (ref 79.5–101.0)
MONO#: 0.6 10*3/uL (ref 0.1–0.9)
MONO%: 11 % (ref 0.0–14.0)
NEUT#: 3.1 10*3/uL (ref 1.5–6.5)
NEUT%: 54.4 % (ref 38.4–76.8)
Platelets: 272 10*3/uL (ref 145–400)
RBC: 4.12 10*6/uL (ref 3.70–5.45)
RDW: 22.2 % — ABNORMAL HIGH (ref 11.2–14.5)
Retic %: 1.88 % (ref 0.70–2.10)
Retic Ct Abs: 77.46 10*3/uL (ref 33.70–90.70)
WBC: 5.6 10*3/uL (ref 3.9–10.3)
lymph#: 1.4 10*3/uL (ref 0.9–3.3)

## 2014-01-06 LAB — COMPREHENSIVE METABOLIC PANEL (CC13)
ALT: 41 U/L (ref 0–55)
AST: 67 U/L — ABNORMAL HIGH (ref 5–34)
Albumin: 3.5 g/dL (ref 3.5–5.0)
Alkaline Phosphatase: 209 U/L — ABNORMAL HIGH (ref 40–150)
Anion Gap: 9 mEq/L (ref 3–11)
BUN: 30.8 mg/dL — ABNORMAL HIGH (ref 7.0–26.0)
CO2: 19 mEq/L — ABNORMAL LOW (ref 22–29)
Calcium: 7.8 mg/dL — ABNORMAL LOW (ref 8.4–10.4)
Chloride: 113 mEq/L — ABNORMAL HIGH (ref 98–109)
Creatinine: 2 mg/dL — ABNORMAL HIGH (ref 0.6–1.1)
Glucose: 77 mg/dl (ref 70–140)
Potassium: 4.1 mEq/L (ref 3.5–5.1)
Sodium: 141 mEq/L (ref 136–145)
Total Bilirubin: 0.65 mg/dL (ref 0.20–1.20)
Total Protein: 6.6 g/dL (ref 6.4–8.3)

## 2014-01-06 LAB — FERRITIN CHCC: Ferritin: 9 ng/ml (ref 9–269)

## 2014-01-06 LAB — LACTATE DEHYDROGENASE (CC13): LDH: 285 U/L — ABNORMAL HIGH (ref 125–245)

## 2014-01-06 NOTE — Progress Notes (Signed)
Checked in new pt with no financial concerns at this time. ° °

## 2014-01-06 NOTE — Telephone Encounter (Signed)
gv adn printed appt sched and avs for pt for OCT thru Dec

## 2014-01-06 NOTE — Progress Notes (Signed)
[place] [phone]) [phone]   Fax:(336) [phone]  CONSULT NOTE  REFERRING PHYSICIAN: Dessie Coma, FNP-C  REASON FOR CONSULTATION:  48 years old African American female with persistent  HPI [name] is a 48 y.o. female with past medical history significant for hypertension, bipolar disorder, chronic kidney disease followed by Dr. Florene Glen, obesity, irritable bowel syndrome as well as history of gastric ulcer. The patient has been complaining of increasing fatigue and weakness as well as nausea and lightheadedness for why. She was seen by her cardiologist and her cardiac medication has been adjusted resulting in this dizzy spells but she continues to have fatigue. She has upper endoscopy in February 2015 by Dr. Benson Norway that showed evidence of gastric ulcer and the patient was treated with proton pump inhibitors. She also had a colonoscopy in 2013 that was unremarkable. Stool for Hemoccult were negative. A recent CBC by her primary care physician on 11/28/2013 showed hemoglobin of 8.6 and hematocrit 29.5%. Ferritin was 5, serum iron was 26 and iron saturation 6% with total iron binding capacity 422. Vitamin B12 was low at 146. The patient was started on Integra plus 3 weeks ago. She did not notice any improvement in her condition. She was referred to me today for evaluation and recommendation regarding treatment of her persistent anemia. She denied having any significant chest pain, shortness breath, cough or hemoptysis. The patient denied having any nausea or vomiting. She gained around 42 pounds in the last 10 months. Her TSH was normal at 3.03.  Family history significant for her mother and father with hypertension. The patient is single and has no children. She is currently retired and used to work for the [employer].  She has few years of smoking but quit in 1990. She drinks alcohol 2 GI no history of drug abuse.  HPI  Past Medical History  Diagnosis Date  .  IUD     HISTORY OF IUD --REMOVED IN 2006  . Hypertension   . Anemia   . History of blood transfusion   . Anxiety   . Bipolar disorder   . Depression   . Seizures 2009,2012    two seizures due to a med changes    Past Surgical History  Procedure Laterality Date  . Rotator cuff repair    . Gastric bypass  2000  . Hysteroscopy w/d&c  11/14/2011    Procedure: DILATATION AND CURETTAGE /HYSTEROSCOPY;  Surgeon: Marylynn Pearson, MD;  Location: [facility];  Service: Gynecology;;  with removal of polyps  . Cholecystectomy    . Intrauterine device (iud) insertion  03/21/2010    Family History  Problem Relation Age of Onset  . Hypertension Mother   . Hypertension Father   . Diabetes Sister   . Hypertension Sister   . Hypertension Brother   . Hypertension Maternal Aunt   . Hypertension Maternal Uncle   . Hypertension Maternal Grandmother   . Hypertension Maternal Grandfather   . Hypertension Paternal Grandmother   . Hypertension Paternal Grandfather   . Diabetes Cousin     Social History History  Substance Use Topics  . Smoking status: Former Smoker -- 0.00 packs/day    Types: Cigarettes    Quit date: 03/21/1988  . Smokeless tobacco: Never Used  . Alcohol Use: No     Comment: social    Allergies  Allergen Reactions  . Ondansetron Nausea And Vomiting  . Sulfa Antibiotics Rash    Current Outpatient Prescriptions  Medication Sig Dispense  Refill  . AMITIZA 24 MCG capsule Take 24 mcg by mouth daily.      Marland Kitchen amLODipine (NORVASC) 10 MG tablet Take 10 mg by mouth daily.      . carvedilol (COREG) 12.5 MG tablet Take 12.5 mg by mouth 2 (two) times daily with a meal.      . Fe Fum-FePoly-FA-Vit C-Vit B3 (INTEGRA F) 125-1 MG CAPS Take 1 tablet by mouth daily.      . furosemide (LASIX) 20 MG tablet Take 20 mg by mouth daily.      Marland Kitchen losartan (COZAAR) 25 MG tablet Take 25 mg by mouth daily.      . naproxen (NAPROSYN) 500 MG tablet Take 500 mg by mouth 2 (two) times daily with a meal.        . promethazine (PHENERGAN) 25 MG tablet Take 25 mg by mouth every 6 (six) hours as needed for nausea or vomiting.      Marland Kitchen tiZANidine (ZANAFLEX) 4 MG tablet Take 4 mg by mouth every 8 (eight) hours as needed for muscle spasms.      . traMADol (ULTRAM-ER) 200 MG 24 hr tablet Take 200 mg by mouth daily.       No current facility-administered medications for this visit.    Review of Systems  Constitutional: positive for fatigue Eyes: negative Ears, nose, mouth, throat, and face: negative Respiratory: negative Cardiovascular: negative Gastrointestinal: negative Genitourinary:negative Integument/breast: negative Hematologic/lymphatic: negative Musculoskeletal:negative Neurological: negative Behavioral/Psych: negative Endocrine: negative Allergic/Immunologic: negative  Physical Exam  [id number], healthy, no distress, well nourished and well developed SKIN: skin color, texture, turgor are normal, no rashes or significant lesions HEAD: Normocephalic, No masses, lesions, tenderness or abnormalities EYES: normal, PERRLA, Conjunctiva are pink and non-injected EARS: External ears normal, Canals clear OROPHARYNX:no exudate, no erythema and lips, buccal mucosa, and tongue normal  NECK: supple, no adenopathy, no JVD LYMPH:  no palpable lymphadenopathy, no hepatosplenomegaly BREAST:not examined LUNGS: clear to auscultation , and palpation HEART: regular rate & rhythm, no murmurs and no gallops ABDOMEN:abdomen soft, non-tender, obese, normal bowel sounds and no masses or organomegaly BACK: Back symmetric, no curvature., No CVA tenderness EXTREMITIES:no joint deformities, effusion, or inflammation, no edema, no skin discoloration, no clubbing  NEURO: alert & oriented x 3 with fluent speech, no focal motor/sensory deficits  PERFORMANCE STATUS: ECOG 1  LABORATORY DATA: Lab Results  Component Value Date   WBC 5.6 01/06/2014   HGB 8.3* 01/06/2014   HCT 29.1* 01/06/2014   MCV 70.8*  01/06/2014   PLT 272 01/06/2014      Chemistry      Component Value Date/Time   NA 141 01/06/2014 1408   NA 142 04/30/2013 0936   K 4.1 01/06/2014 1408   K 4.1 04/30/2013 0936   CL 106 04/30/2013 0936   CO2 19* 01/06/2014 1408   CO2 21 04/30/2013 0936   BUN 30.8* 01/06/2014 1408   BUN 20 04/30/2013 0936   CREATININE 2.0* 01/06/2014 1408   CREATININE 1.42* 04/30/2013 0936      Component Value Date/Time   CALCIUM 7.8* 01/06/2014 1408   CALCIUM 8.7 04/30/2013 0936   ALKPHOS 209* 01/06/2014 1408   ALKPHOS 144* 04/30/2013 0936   AST 67* 01/06/2014 1408   AST 18 04/30/2013 0936   ALT 41 01/06/2014 1408   ALT 9 04/30/2013 0936   BILITOT 0.65 01/06/2014 1408   BILITOT 0.5 04/30/2013 0936       RADIOGRAPHIC STUDIES: No results found.  ASSESSMENT: This is a very pleasant  48 years old [place] American female with persistent anemia most likely secondary to iron deficiency but the patient also has low vitamin B12 level secondary to malabsorption. She also has chronic renal insufficiency that could be contributing for anemia of chronic disease.   PLAN: I had a lengthy discussion with the patient today about her current disease status and treatment options. I ordered several studies today for evaluation of her anemia including repeat CBC, comprehensive metabolic panel, reticulocyte, LDH, serum erythropoietin, serum protein electrophoreses, iron study and ferritin. I recommended for the patient to start treatment with vitamin B12 1000 mcg intramuscular on a weekly basis for the next 4 weeks then monthly after that for the vitamin B12 deficiency. I also gave the patient the option of continuing oral iron tablets with Integra plus versus proceeding with intravenous iron infusion with Feraheme especially with the lack of response to the oral iron tablets at this point. The patient is interested in proceeding with the IV iron infusion and I will arrange for her to receive Feraheme 510 mg IV weekly x2  doses. She is expected to start the first infusion on 01/10/2014. I would see the patient back for followup visit in 2 months for reevaluation with repeat CBC and iron study. She was advised to call immediately if she has any concerning symptoms in the interval. The patient voices understanding of current disease status and treatment options and is in agreement with the current care plan.  All questions were answered. The patient knows to call the clinic with any problems, questions or concerns. We can certainly see the patient much sooner if necessary.  Thank you so much for allowing me to participate in the care of [name]. I will continue to follow up the patient with you and assist in her care.  I spent 40 minutes counseling the patient face to face. The total time spent in the appointment was 60 minutes.  Disclaimer: This note was dictated with voice recognition software. Similar sounding words can inadvertently be transcribed and may not be corrected upon review.   [name] K. 01/06/2014, 3:31 PM

## 2014-01-08 LAB — PROTEIN ELECTROPHORESIS, SERUM
Albumin ELP: 55.9 % (ref 55.8–66.1)
Alpha-1-Globulin: 4.7 % (ref 2.9–4.9)
Alpha-2-Globulin: 11 % (ref 7.1–11.8)
Beta 2: 4.9 % (ref 3.2–6.5)
Beta Globulin: 7.6 % — ABNORMAL HIGH (ref 4.7–7.2)
Gamma Globulin: 15.9 % (ref 11.1–18.8)
Total Protein, Serum Electrophoresis: 6.3 g/dL (ref 6.0–8.3)

## 2014-01-08 LAB — ERYTHROPOIETIN: Erythropoietin: 63.2 m[IU]/mL — ABNORMAL HIGH (ref 2.6–18.5)

## 2014-01-10 ENCOUNTER — Ambulatory Visit (HOSPITAL_BASED_OUTPATIENT_CLINIC_OR_DEPARTMENT_OTHER): Payer: Medicare Other

## 2014-01-10 ENCOUNTER — Ambulatory Visit: Payer: Medicare Other

## 2014-01-10 VITALS — BP 133/79 | HR 89 | Temp 97.9°F | Resp 18

## 2014-01-10 DIAGNOSIS — D509 Iron deficiency anemia, unspecified: Secondary | ICD-10-CM

## 2014-01-10 DIAGNOSIS — D518 Other vitamin B12 deficiency anemias: Secondary | ICD-10-CM | POA: Diagnosis not present

## 2014-01-10 MED ORDER — CYANOCOBALAMIN 1000 MCG/ML IJ SOLN
1000.0000 ug | Freq: Once | INTRAMUSCULAR | Status: AC
  Administered 2014-01-10: 1000 ug via INTRAMUSCULAR

## 2014-01-10 MED ORDER — SODIUM CHLORIDE 0.9 % IV SOLN
510.0000 mg | Freq: Once | INTRAVENOUS | Status: AC
  Administered 2014-01-10: 510 mg via INTRAVENOUS
  Filled 2014-01-10: qty 17

## 2014-01-10 MED ORDER — CYANOCOBALAMIN 1000 MCG/ML IJ SOLN
INTRAMUSCULAR | Status: AC
  Filled 2014-01-10: qty 1

## 2014-01-10 MED ORDER — FERUMOXYTOL INJECTION 510 MG/17 ML: 510.0000 mg | Freq: Once | INTRAVENOUS | Status: DC

## 2014-01-10 MED ORDER — SODIUM CHLORIDE 0.9 % IV SOLN
Freq: Once | INTRAVENOUS | Status: AC
  Administered 2014-01-10: 09:00:00 via INTRAVENOUS

## 2014-01-10 NOTE — Patient Instructions (Signed)

## 2014-01-13 DIAGNOSIS — D500 Iron deficiency anemia secondary to blood loss (chronic): Secondary | ICD-10-CM | POA: Diagnosis not present

## 2014-01-15 DIAGNOSIS — N183 Chronic kidney disease, stage 3 (moderate): Secondary | ICD-10-CM | POA: Diagnosis not present

## 2014-01-15 DIAGNOSIS — I10 Essential (primary) hypertension: Secondary | ICD-10-CM | POA: Diagnosis not present

## 2014-01-17 ENCOUNTER — Ambulatory Visit: Payer: Medicare Other

## 2014-01-17 ENCOUNTER — Other Ambulatory Visit: Payer: Self-pay | Admitting: Internal Medicine

## 2014-01-17 ENCOUNTER — Ambulatory Visit (HOSPITAL_BASED_OUTPATIENT_CLINIC_OR_DEPARTMENT_OTHER): Payer: Medicare Other

## 2014-01-17 VITALS — BP 102/68 | HR 87 | Temp 98.7°F | Resp 18

## 2014-01-17 DIAGNOSIS — D509 Iron deficiency anemia, unspecified: Secondary | ICD-10-CM

## 2014-01-17 DIAGNOSIS — Z23 Encounter for immunization: Secondary | ICD-10-CM

## 2014-01-17 DIAGNOSIS — D518 Other vitamin B12 deficiency anemias: Secondary | ICD-10-CM | POA: Diagnosis not present

## 2014-01-17 DIAGNOSIS — D649 Anemia, unspecified: Secondary | ICD-10-CM

## 2014-01-17 MED ORDER — SODIUM CHLORIDE 0.9 % IV SOLN
510.0000 mg | Freq: Once | INTRAVENOUS | Status: AC
  Administered 2014-01-17: 510 mg via INTRAVENOUS
  Filled 2014-01-17: qty 17

## 2014-01-17 MED ORDER — CYANOCOBALAMIN 1000 MCG/ML IJ SOLN
1000.0000 ug | Freq: Once | INTRAMUSCULAR | Status: AC
  Administered 2014-01-17: 1000 ug via INTRAMUSCULAR

## 2014-01-17 MED ORDER — INFLUENZA VAC SPLIT QUAD 0.5 ML IM SUSY
0.5000 mL | PREFILLED_SYRINGE | Freq: Once | INTRAMUSCULAR | Status: AC
  Administered 2014-01-17: 0.5 mL via INTRAMUSCULAR
  Filled 2014-01-17: qty 0.5

## 2014-01-17 MED ORDER — FERUMOXYTOL INJECTION 510 MG/17 ML: 510.0000 mg | Freq: Once | INTRAVENOUS | Status: DC

## 2014-01-17 MED ORDER — CYANOCOBALAMIN 1000 MCG/ML IJ SOLN
INTRAMUSCULAR | Status: AC
  Filled 2014-01-17: qty 1

## 2014-01-17 MED ORDER — SODIUM CHLORIDE 0.9 % IV SOLN
Freq: Once | INTRAVENOUS | Status: AC
  Administered 2014-01-17: 09:00:00 via INTRAVENOUS

## 2014-01-17 NOTE — Patient Instructions (Signed)

## 2014-01-17 NOTE — Progress Notes (Signed)
Patient refused 30 minute feraheme post observation.

## 2014-01-17 NOTE — Progress Notes (Signed)
Duplicate appts. 

## 2014-01-20 ENCOUNTER — Encounter: Payer: Self-pay | Admitting: Internal Medicine

## 2014-01-24 ENCOUNTER — Ambulatory Visit: Payer: Medicare Other

## 2014-01-31 ENCOUNTER — Ambulatory Visit (HOSPITAL_BASED_OUTPATIENT_CLINIC_OR_DEPARTMENT_OTHER): Payer: Medicare Other

## 2014-01-31 DIAGNOSIS — R55 Syncope and collapse: Secondary | ICD-10-CM | POA: Diagnosis not present

## 2014-01-31 DIAGNOSIS — I129 Hypertensive chronic kidney disease with stage 1 through stage 4 chronic kidney disease, or unspecified chronic kidney disease: Secondary | ICD-10-CM | POA: Diagnosis not present

## 2014-01-31 DIAGNOSIS — E538 Deficiency of other specified B group vitamins: Secondary | ICD-10-CM | POA: Diagnosis not present

## 2014-01-31 DIAGNOSIS — N183 Chronic kidney disease, stage 3 (moderate): Secondary | ICD-10-CM | POA: Diagnosis not present

## 2014-01-31 DIAGNOSIS — D509 Iron deficiency anemia, unspecified: Secondary | ICD-10-CM

## 2014-01-31 DIAGNOSIS — D500 Iron deficiency anemia secondary to blood loss (chronic): Secondary | ICD-10-CM | POA: Diagnosis not present

## 2014-01-31 MED ORDER — CYANOCOBALAMIN 1000 MCG/ML IJ SOLN
1000.0000 ug | Freq: Once | INTRAMUSCULAR | Status: AC
  Administered 2014-01-31: 1000 ug via INTRAMUSCULAR

## 2014-01-31 NOTE — Patient Instructions (Signed)

## 2014-02-28 ENCOUNTER — Other Ambulatory Visit: Payer: Medicare Other

## 2014-02-28 ENCOUNTER — Other Ambulatory Visit: Payer: Self-pay | Admitting: Obstetrics and Gynecology

## 2014-02-28 DIAGNOSIS — R102 Pelvic and perineal pain: Secondary | ICD-10-CM | POA: Diagnosis not present

## 2014-02-28 DIAGNOSIS — R635 Abnormal weight gain: Secondary | ICD-10-CM | POA: Diagnosis not present

## 2014-02-28 DIAGNOSIS — Z01419 Encounter for gynecological examination (general) (routine) without abnormal findings: Secondary | ICD-10-CM | POA: Diagnosis not present

## 2014-02-28 DIAGNOSIS — N6459 Other signs and symptoms in breast: Secondary | ICD-10-CM | POA: Diagnosis not present

## 2014-03-03 ENCOUNTER — Ambulatory Visit: Payer: Medicare Other

## 2014-03-03 ENCOUNTER — Other Ambulatory Visit: Payer: Self-pay | Admitting: Gastroenterology

## 2014-03-03 ENCOUNTER — Ambulatory Visit: Payer: Medicare Other | Admitting: Internal Medicine

## 2014-03-03 ENCOUNTER — Other Ambulatory Visit: Payer: Medicare Other

## 2014-03-03 ENCOUNTER — Other Ambulatory Visit: Payer: Self-pay | Admitting: Obstetrics and Gynecology

## 2014-03-03 DIAGNOSIS — R1084 Generalized abdominal pain: Secondary | ICD-10-CM

## 2014-03-03 DIAGNOSIS — R600 Localized edema: Secondary | ICD-10-CM | POA: Diagnosis not present

## 2014-03-03 DIAGNOSIS — D500 Iron deficiency anemia secondary to blood loss (chronic): Secondary | ICD-10-CM | POA: Diagnosis not present

## 2014-03-03 DIAGNOSIS — N644 Mastodynia: Secondary | ICD-10-CM

## 2014-03-03 DIAGNOSIS — R635 Abnormal weight gain: Secondary | ICD-10-CM | POA: Diagnosis not present

## 2014-03-03 DIAGNOSIS — R1012 Left upper quadrant pain: Secondary | ICD-10-CM | POA: Diagnosis not present

## 2014-03-07 LAB — CYTOLOGY - PAP

## 2014-03-10 ENCOUNTER — Other Ambulatory Visit: Payer: Medicare Other

## 2014-03-10 ENCOUNTER — Ambulatory Visit
Admission: RE | Admit: 2014-03-10 | Discharge: 2014-03-10 | Disposition: A | Discharge status: Home / Self Care | Payer: Medicare Other | Source: Ambulatory Visit | Attending: Obstetrics and Gynecology | Admitting: Obstetrics and Gynecology

## 2014-03-10 DIAGNOSIS — N644 Mastodynia: Secondary | ICD-10-CM

## 2014-03-10 DIAGNOSIS — N6459 Other signs and symptoms in breast: Secondary | ICD-10-CM | POA: Diagnosis not present

## 2014-03-12 DIAGNOSIS — M25512 Pain in left shoulder: Secondary | ICD-10-CM | POA: Diagnosis not present

## 2014-03-12 DIAGNOSIS — I10 Essential (primary) hypertension: Secondary | ICD-10-CM | POA: Diagnosis not present

## 2014-03-19 DIAGNOSIS — K589 Irritable bowel syndrome without diarrhea: Secondary | ICD-10-CM | POA: Diagnosis not present

## 2014-03-19 DIAGNOSIS — D649 Anemia, unspecified: Secondary | ICD-10-CM | POA: Diagnosis not present

## 2014-03-19 DIAGNOSIS — I10 Essential (primary) hypertension: Secondary | ICD-10-CM | POA: Diagnosis not present

## 2014-03-19 DIAGNOSIS — M1990 Unspecified osteoarthritis, unspecified site: Secondary | ICD-10-CM | POA: Diagnosis not present

## 2014-03-25 ENCOUNTER — Ambulatory Visit
Admission: RE | Admit: 2014-03-25 | Discharge: 2014-03-25 | Disposition: A | Discharge status: Home / Self Care | Payer: Medicare Other | Source: Ambulatory Visit | Attending: Gastroenterology | Admitting: Gastroenterology

## 2014-03-25 DIAGNOSIS — Z975 Presence of (intrauterine) contraceptive device: Secondary | ICD-10-CM | POA: Diagnosis not present

## 2014-03-25 DIAGNOSIS — Z9884 Bariatric surgery status: Secondary | ICD-10-CM | POA: Diagnosis not present

## 2014-03-25 DIAGNOSIS — R1084 Generalized abdominal pain: Secondary | ICD-10-CM

## 2014-03-25 DIAGNOSIS — K5900 Constipation, unspecified: Secondary | ICD-10-CM | POA: Diagnosis not present

## 2014-03-25 DIAGNOSIS — Z9049 Acquired absence of other specified parts of digestive tract: Secondary | ICD-10-CM | POA: Diagnosis not present

## 2014-03-25 MED ORDER — IOHEXOL 300 MG/ML  SOLN
125.0000 mL | Freq: Once | INTRAMUSCULAR | Status: AC | PRN
  Administered 2014-03-25: 125 mL via INTRAVENOUS

## 2014-03-28 DIAGNOSIS — N941 Dyspareunia: Secondary | ICD-10-CM | POA: Diagnosis not present

## 2014-04-03 DIAGNOSIS — D500 Iron deficiency anemia secondary to blood loss (chronic): Secondary | ICD-10-CM | POA: Diagnosis not present

## 2014-04-03 DIAGNOSIS — D509 Iron deficiency anemia, unspecified: Secondary | ICD-10-CM | POA: Diagnosis not present

## 2014-04-03 DIAGNOSIS — R1032 Left lower quadrant pain: Secondary | ICD-10-CM | POA: Diagnosis not present

## 2014-04-03 DIAGNOSIS — K3189 Other diseases of stomach and duodenum: Secondary | ICD-10-CM | POA: Diagnosis not present

## 2014-04-04 DIAGNOSIS — L820 Inflamed seborrheic keratosis: Secondary | ICD-10-CM | POA: Diagnosis not present

## 2014-04-28 DIAGNOSIS — I10 Essential (primary) hypertension: Secondary | ICD-10-CM | POA: Diagnosis not present

## 2014-04-28 DIAGNOSIS — M1990 Unspecified osteoarthritis, unspecified site: Secondary | ICD-10-CM | POA: Diagnosis not present

## 2014-04-28 DIAGNOSIS — D649 Anemia, unspecified: Secondary | ICD-10-CM | POA: Diagnosis not present

## 2014-05-01 DIAGNOSIS — R1903 Right lower quadrant abdominal swelling, mass and lump: Secondary | ICD-10-CM | POA: Diagnosis not present

## 2014-05-14 DIAGNOSIS — G5600 Carpal tunnel syndrome, unspecified upper limb: Secondary | ICD-10-CM | POA: Diagnosis not present

## 2014-05-14 DIAGNOSIS — G629 Polyneuropathy, unspecified: Secondary | ICD-10-CM | POA: Diagnosis not present

## 2014-05-19 DIAGNOSIS — G5602 Carpal tunnel syndrome, left upper limb: Secondary | ICD-10-CM | POA: Diagnosis not present

## 2014-05-19 DIAGNOSIS — I10 Essential (primary) hypertension: Secondary | ICD-10-CM | POA: Diagnosis not present

## 2014-05-19 DIAGNOSIS — G5601 Carpal tunnel syndrome, right upper limb: Secondary | ICD-10-CM | POA: Diagnosis not present

## 2014-05-26 DIAGNOSIS — I10 Essential (primary) hypertension: Secondary | ICD-10-CM | POA: Diagnosis not present

## 2014-05-26 DIAGNOSIS — D649 Anemia, unspecified: Secondary | ICD-10-CM | POA: Diagnosis not present

## 2014-05-26 DIAGNOSIS — L219 Seborrheic dermatitis, unspecified: Secondary | ICD-10-CM | POA: Diagnosis not present

## 2014-05-26 DIAGNOSIS — M1990 Unspecified osteoarthritis, unspecified site: Secondary | ICD-10-CM | POA: Diagnosis not present

## 2014-06-18 ENCOUNTER — Other Ambulatory Visit: Payer: Self-pay | Admitting: Radiology

## 2014-06-18 DIAGNOSIS — Z6841 Body Mass Index (BMI) 40.0 and over, adult: Secondary | ICD-10-CM | POA: Diagnosis not present

## 2014-06-18 DIAGNOSIS — G5602 Carpal tunnel syndrome, left upper limb: Secondary | ICD-10-CM | POA: Diagnosis not present

## 2014-06-18 DIAGNOSIS — I10 Essential (primary) hypertension: Secondary | ICD-10-CM | POA: Diagnosis not present

## 2014-06-18 DIAGNOSIS — G5601 Carpal tunnel syndrome, right upper limb: Secondary | ICD-10-CM | POA: Diagnosis not present

## 2014-06-26 DIAGNOSIS — I10 Essential (primary) hypertension: Secondary | ICD-10-CM | POA: Diagnosis not present

## 2014-06-26 DIAGNOSIS — R635 Abnormal weight gain: Secondary | ICD-10-CM | POA: Diagnosis not present

## 2014-06-30 DIAGNOSIS — R1012 Left upper quadrant pain: Secondary | ICD-10-CM | POA: Diagnosis not present

## 2014-07-01 NOTE — Patient Instructions (Signed)
[name]  07/01/2014   Your procedure is scheduled on:  07/07/2017  Report to [facility] at 65  AM.  Call this number if you have problems the morning of surgery: [phone]   Remember:   Do not eat food or drink liquids after midnight.   Take these medicines the morning of surgery with A SIP OF WATER: amlodipine, coreg, celebrex, valium, nexium, cozaar, phenergan, zanaflex, topamax, ultram.   Do not wear jewelry, make-up or nail polish.  Do not wear lotions, powders, or perfumes.   Do not shave 48 hours prior to surgery. Men may shave face and neck.  Do not bring valuables to the hospital.  [facility] is not responsible for any belongings or valuables.               Contacts, dentures or bridgework may not be worn into surgery.  Leave suitcase in the car. After surgery it may be brought to your room.  For patients admitted to the hospital, discharge time is determined by your treatment team.               Patients discharged the day of surgery will not be allowed to drive home.  Name and phone number of your driver: family  Special Instructions: Shower using CHG 2 nights before surgery and the night before surgery.  If you shower the day of surgery use CHG.  Use special wash - you have one bottle of CHG for all showers.  You should use approximately 1/3 of the bottle for each shower.   Please read over the following fact sheets that you were given: Pain Booklet, Coughing and Deep Breathing, Surgical Site Infection Prevention, Anesthesia Post-op Instructions and Care and Recovery After Surgery Carpal Tunnel Release Carpal tunnel release is done to relieve the pressure on the nerves and tendons on the bottom side of your wrist.  LET YOUR CAREGIVER KNOW ABOUT:   Allergies to food or medicine.  Medicines taken, including vitamins, herbs, eyedrops, over-the-counter medicines, and creams.  Use of steroids (by mouth or creams).  Previous problems with anesthetics or  numbing medicines.  History of bleeding problems or blood clots.  Previous surgery.  Other health problems, including diabetes and kidney problems.  Possibility of pregnancy, if this applies. RISKS AND COMPLICATIONS  Some problems that may happen after this procedure include:  Infection.  Damage to the nerves, arteries or tendons could occur. This would be very uncommon.  Bleeding. BEFORE THE PROCEDURE   This surgery may be done while you are asleep (general anesthetic) or may be done under a block where only your forearm and the surgical area is numb.  If the surgery is done under a block, the numbness will gradually wear off within several hours after surgery. HOME CARE INSTRUCTIONS   Have a responsible person with you for 24 hours.  Do not drive a car or use public transportation for 24 hours.  Only take over-the-counter or prescription medicines for pain, discomfort, or fever as directed by your caregiver. Take them as directed.  You may put ice on the palm side of the affected wrist.  Put ice in a plastic bag.  Place a towel between your skin and the bag.  Leave the ice on for 20 to 30 minutes, 4 times per day.  If you were given a splint to keep your wrist from bending, use it as directed. It is important to wear the splint at night  or as directed. Use the splint for as long as you have pain or numbness in your hand, arm, or wrist. This may take 1 to 2 months.  Keep your hand raised (elevated) above the level of your heart as much as possible. This keeps swelling down and helps with discomfort.  Change bandages (dressings) as directed.  Keep the wound clean and dry. SEEK MEDICAL CARE IF:   You develop pain not relieved with medications.  You develop numbness of your hand.  You develop bleeding from your surgical site.  You have an oral temperature above 102 F (38.9 C).  You develop redness or swelling of the surgical site.  You develop new, unexplained  problems. SEEK IMMEDIATE MEDICAL CARE IF:   You develop a rash.  You have difficulty breathing.  You develop any reaction or side effects to medications given. Document Released: 05/28/2003 Document Revised: 05/30/2011 Document Reviewed: 01/11/2007 [facility] Patient Information 2015 [place], [place]. This information is not intended to replace advice given to you by your health care provider. Make sure you discuss any questions you have with your health care provider. PATIENT INSTRUCTIONS POST-ANESTHESIA  IMMEDIATELY FOLLOWING SURGERY:  Do not drive or operate machinery for the first twenty four hours after surgery.  Do not make any important decisions for twenty four hours after surgery or while taking narcotic pain medications or sedatives.  If you develop intractable nausea and vomiting or a severe headache please notify your doctor immediately.  FOLLOW-UP:  Please make an appointment with your surgeon as instructed. You do not need to follow up with anesthesia unless specifically instructed to do so.  WOUND CARE INSTRUCTIONS (if applicable):  Keep a dry clean dressing on the anesthesia/puncture wound site if there is drainage.  Once the wound has quit draining you may leave it open to air.  Generally you should leave the bandage intact for twenty four hours unless there is drainage.  If the epidural site drains for more than 36-48 hours please call the anesthesia department.  QUESTIONS?:  Please feel free to call your physician or the hospital operator if you have any questions, and they will be happy to assist you.

## 2014-07-03 ENCOUNTER — Other Ambulatory Visit: Payer: Self-pay

## 2014-07-03 ENCOUNTER — Encounter (HOSPITAL_COMMUNITY): Payer: Self-pay

## 2014-07-03 ENCOUNTER — Encounter (HOSPITAL_COMMUNITY)
Admission: RE | Admit: 2014-07-03 | Discharge: 2014-07-03 | Disposition: A | Discharge status: Home / Self Care | Payer: Medicare Other | Source: Ambulatory Visit | Attending: Orthopaedic Surgery | Admitting: Orthopaedic Surgery

## 2014-07-03 DIAGNOSIS — Z01810 Encounter for preprocedural cardiovascular examination: Secondary | ICD-10-CM | POA: Diagnosis not present

## 2014-07-03 DIAGNOSIS — Z01812 Encounter for preprocedural laboratory examination: Secondary | ICD-10-CM | POA: Insufficient documentation

## 2014-07-03 LAB — CBC WITH DIFFERENTIAL/PLATELET
Basophils Absolute: 0 10*3/uL (ref 0.0–0.1)
Basophils Relative: 1 % (ref 0–1)
Eosinophils Absolute: 0.4 10*3/uL (ref 0.0–0.7)
Eosinophils Relative: 8 % — ABNORMAL HIGH (ref 0–5)
HCT: 43.2 % (ref 36.0–46.0)
Hemoglobin: 13.8 g/dL (ref 12.0–15.0)
Lymphocytes Relative: 31 % (ref 12–46)
Lymphs Abs: 1.6 10*3/uL (ref 0.7–4.0)
MCH: 29.5 pg (ref 26.0–34.0)
MCHC: 31.9 g/dL (ref 30.0–36.0)
MCV: 92.3 fL (ref 78.0–100.0)
Monocytes Absolute: 0.5 10*3/uL (ref 0.1–1.0)
Monocytes Relative: 10 % (ref 3–12)
Neutro Abs: 2.6 10*3/uL (ref 1.7–7.7)
Neutrophils Relative %: 50 % (ref 43–77)
Platelets: 161 10*3/uL (ref 150–400)
RBC: 4.68 MIL/uL (ref 3.87–5.11)
RDW: 15.8 % — ABNORMAL HIGH (ref 11.5–15.5)
WBC: 5 10*3/uL (ref 4.0–10.5)

## 2014-07-03 LAB — COMPREHENSIVE METABOLIC PANEL
ALT: 19 U/L (ref 0–35)
AST: 24 U/L (ref 0–37)
Albumin: 3.8 g/dL (ref 3.5–5.2)
Alkaline Phosphatase: 152 U/L — ABNORMAL HIGH (ref 39–117)
Anion gap: 7 (ref 5–15)
BUN: 28 mg/dL — ABNORMAL HIGH (ref 6–23)
CO2: 24 mmol/L (ref 19–32)
Calcium: 8.4 mg/dL (ref 8.4–10.5)
Chloride: 111 mmol/L (ref 96–112)
Creatinine, Ser: 1.69 mg/dL — ABNORMAL HIGH (ref 0.50–1.10)
GFR calc Af Amer: 40 mL/min — ABNORMAL LOW (ref >90)
GFR calc non Af Amer: 35 mL/min — ABNORMAL LOW (ref >90)
Glucose, Bld: 87 mg/dL (ref 70–99)
Potassium: 4 mmol/L (ref 3.5–5.1)
Sodium: 142 mmol/L (ref 135–145)
Total Bilirubin: 0.7 mg/dL (ref 0.3–1.2)
Total Protein: 6.6 g/dL (ref 6.0–8.3)

## 2014-07-03 LAB — URINALYSIS, ROUTINE W REFLEX MICROSCOPIC
Bilirubin Urine: NEGATIVE
Glucose, UA: NEGATIVE mg/dL
Leukocytes, UA: NEGATIVE
Nitrite: NEGATIVE
Protein, ur: 100 mg/dL — AB
Specific Gravity, Urine: 1.025 (ref 1.005–1.030)
Urobilinogen, UA: 0.2 mg/dL (ref 0.0–1.0)
pH: 6 (ref 5.0–8.0)

## 2014-07-03 LAB — URINE MICROSCOPIC-ADD ON

## 2014-07-07 NOTE — H&P (Signed)
[name] is an 49 y.o. female.   Chief Complaint: Carpal tunnel syndrome right HPI: She has had a long history of bilateral carpal tunnel syndrome with more pain on the right side.  She has had positive EMGs.  She has not responded well to conservative treatment over many months.  I have explained surgical open release of the volar carpal ligament to her as an outpatient.  I went over the risks and imponderables.  She asked appropriate questions.  She has elected to have surgery done at this time as an outpatient.  Past Medical History  Diagnosis Date  . IUD     HISTORY OF IUD --REMOVED IN 2006  . Hypertension   . Anemia   . History of blood transfusion   . Anxiety   . Bipolar disorder   . Depression   . Seizures 2009,2012    two seizures due to a med changes    Past Surgical History  Procedure Laterality Date  . Rotator cuff repair    . Gastric bypass  2000  . Hysteroscopy w/d&c  11/14/2011    Procedure: DILATATION AND CURETTAGE /HYSTEROSCOPY;  Surgeon: Marylynn Pearson, MD;  Location: [facility];  Service: Gynecology;;  with removal of polyps  . Cholecystectomy    . Intrauterine device (iud) insertion  03/21/2010    Family History  Problem Relation Age of Onset  . Hypertension Mother   . Kidney failure Mother   . Hypertension Father   . Diabetes Sister   . Hypertension Sister   . Hypertension Brother   . Hypertension Maternal Aunt   . Hypertension Maternal Uncle   . Hypertension Maternal Grandmother   . Hypertension Maternal Grandfather   . Hypertension Paternal Grandmother   . Hypertension Paternal Grandfather   . Diabetes Cousin    Social History:  reports that she quit smoking about 26 years ago. Her smoking use included Cigarettes. She has a 1 pack-year smoking history. She has never used smokeless tobacco. She reports that she does not drink alcohol or use illicit drugs.  Allergies:  Allergies  Allergen Reactions  . Ondansetron Nausea And Vomiting  . Sulfa  Antibiotics Rash    No prescriptions prior to admission    No results found for this or any previous visit (from the past 48 hour(s)). No results found.  Review of Systems  Cardiovascular:       Essential hypertension  Musculoskeletal: Positive for joint pain (She has noctural night pain in both hands affecting median nerve.  It has not responded well to conservative treatment.).       History of shoulder rotator cuff repairs bilaterally.  Endo/Heme/Allergies:       History of anemia  Psychiatric/Behavioral:       History of bipolar disorder    There were no vitals taken for this visit. Physical Exam  Constitutional: She is oriented to person, place, and time. She appears well-developed and well-nourished.  HENT:  Head: Normocephalic and atraumatic.  Eyes: EOM are normal. Pupils are equal, round, and reactive to light.  Neck: Normal range of motion.  Cardiovascular: Normal rate, regular rhythm, normal heart sounds and intact distal pulses.   Respiratory: Effort normal and breath sounds normal.  GI: Soft.  Musculoskeletal: She exhibits tenderness (She has positive Phalen and Tinel of both medial nerves.  The right hand has more pain and tenderness.  ROM of the wrist and fingers is normal.).  Neurological: She is alert and oriented to person, place, and time.  She has normal reflexes.  Skin: Skin is warm and dry.  Psychiatric: She has a normal mood and affect. Her behavior is normal. Judgment and thought content normal.     Assessment/Plan Bilateral carpal tunnel syndrome, worse on the right.  To do open carpal tunnel release on the right hand/wrist area.  Treyden Hakim 07/07/2014, 2:36 PM

## 2014-07-08 ENCOUNTER — Encounter (HOSPITAL_COMMUNITY): Payer: Self-pay | Admitting: *Deleted

## 2014-07-08 ENCOUNTER — Encounter (HOSPITAL_COMMUNITY)
Admission: RE | Disposition: A | Discharge status: Home Health | Payer: Self-pay | Source: Ambulatory Visit | Attending: Orthopaedic Surgery

## 2014-07-08 ENCOUNTER — Ambulatory Visit (HOSPITAL_COMMUNITY)
Admission: RE | Admit: 2014-07-08 | Discharge: 2014-07-08 | Disposition: A | Discharge status: Home Health | Payer: Medicare Other | Source: Ambulatory Visit | Attending: Orthopaedic Surgery | Admitting: Orthopaedic Surgery

## 2014-07-08 ENCOUNTER — Ambulatory Visit (HOSPITAL_COMMUNITY): Payer: Medicare Other | Admitting: Anesthesiology

## 2014-07-08 DIAGNOSIS — Z882 Allergy status to sulfonamides status: Secondary | ICD-10-CM | POA: Diagnosis not present

## 2014-07-08 DIAGNOSIS — F419 Anxiety disorder, unspecified: Secondary | ICD-10-CM | POA: Diagnosis not present

## 2014-07-08 DIAGNOSIS — Z87891 Personal history of nicotine dependence: Secondary | ICD-10-CM | POA: Insufficient documentation

## 2014-07-08 DIAGNOSIS — Z9049 Acquired absence of other specified parts of digestive tract: Secondary | ICD-10-CM | POA: Insufficient documentation

## 2014-07-08 DIAGNOSIS — Z9884 Bariatric surgery status: Secondary | ICD-10-CM | POA: Insufficient documentation

## 2014-07-08 DIAGNOSIS — G5601 Carpal tunnel syndrome, right upper limb: Secondary | ICD-10-CM | POA: Diagnosis not present

## 2014-07-08 DIAGNOSIS — F319 Bipolar disorder, unspecified: Secondary | ICD-10-CM | POA: Insufficient documentation

## 2014-07-08 DIAGNOSIS — I10 Essential (primary) hypertension: Secondary | ICD-10-CM | POA: Diagnosis not present

## 2014-07-08 HISTORY — PX: CARPAL TUNNEL RELEASE: SHX101

## 2014-07-08 LAB — PREGNANCY, URINE: Preg Test, Ur: NEGATIVE

## 2014-07-08 SURGERY — CARPAL TUNNEL RELEASE
Anesthesia: Regional | Site: Wrist | Laterality: Right

## 2014-07-08 MED ORDER — MIDAZOLAM HCL 5 MG/5ML IJ SOLN
INTRAMUSCULAR | Status: DC | PRN
  Administered 2014-07-08 (×2): 1 mg via INTRAVENOUS

## 2014-07-08 MED ORDER — LACTATED RINGERS IV SOLN
INTRAVENOUS | Status: DC
  Administered 2014-07-08: 1000 mL via INTRAVENOUS

## 2014-07-08 MED ORDER — FENTANYL CITRATE (PF) 100 MCG/2ML IJ SOLN
25.0000 ug | INTRAMUSCULAR | Status: AC
  Administered 2014-07-08: 25 ug via INTRAVENOUS

## 2014-07-08 MED ORDER — SODIUM CHLORIDE 0.9 % IJ SOLN
INTRAMUSCULAR | Status: AC
  Filled 2014-07-08: qty 3

## 2014-07-08 MED ORDER — FENTANYL CITRATE (PF) 100 MCG/2ML IJ SOLN: 25.0000 ug | INTRAMUSCULAR | Status: DC | PRN

## 2014-07-08 MED ORDER — MIDAZOLAM HCL 2 MG/2ML IJ SOLN
1.0000 mg | INTRAMUSCULAR | Status: DC | PRN
  Administered 2014-07-08: 2 mg via INTRAVENOUS

## 2014-07-08 MED ORDER — CHLORHEXIDINE GLUCONATE 4 % EX LIQD: 60.0000 mL | Freq: Once | CUTANEOUS | Status: DC

## 2014-07-08 MED ORDER — MIDAZOLAM HCL 2 MG/2ML IJ SOLN
INTRAMUSCULAR | Status: AC
  Filled 2014-07-08: qty 2

## 2014-07-08 MED ORDER — SODIUM CHLORIDE 0.9 % IJ SOLN
INTRAMUSCULAR | Status: DC | PRN
  Administered 2014-07-08: 1 mL via INTRAVENOUS

## 2014-07-08 MED ORDER — LIDOCAINE HCL (PF) 0.5 % IJ SOLN
INTRAMUSCULAR | Status: AC
  Filled 2014-07-08: qty 50

## 2014-07-08 MED ORDER — FENTANYL CITRATE (PF) 100 MCG/2ML IJ SOLN
INTRAMUSCULAR | Status: AC
  Filled 2014-07-08: qty 2

## 2014-07-08 MED ORDER — FENTANYL CITRATE (PF) 100 MCG/2ML IJ SOLN
INTRAMUSCULAR | Status: DC | PRN
  Administered 2014-07-08 (×2): 50 ug via INTRAVENOUS

## 2014-07-08 MED ORDER — PROPOFOL 10 MG/ML IV BOLUS
INTRAVENOUS | Status: AC
  Filled 2014-07-08: qty 20

## 2014-07-08 MED ORDER — LIDOCAINE HCL (CARDIAC) 10 MG/ML IV SOLN
INTRAVENOUS | Status: DC | PRN
  Administered 2014-07-08: 25 mg via INTRAVENOUS

## 2014-07-08 MED ORDER — LIDOCAINE HCL (PF) 0.5 % IJ SOLN
INTRAMUSCULAR | Status: DC | PRN
  Administered 2014-07-08: 50 mL

## 2014-07-08 MED ORDER — PROPOFOL INFUSION 10 MG/ML OPTIME
INTRAVENOUS | Status: DC | PRN
  Administered 2014-07-08: 125 ug/kg/min via INTRAVENOUS

## 2014-07-08 MED ORDER — SODIUM CHLORIDE 0.9 % IR SOLN
Status: DC | PRN
  Administered 2014-07-08: 1000 mL

## 2014-07-08 MED ORDER — SODIUM CHLORIDE 0.9 % IJ SOLN
INTRAMUSCULAR | Status: AC
  Filled 2014-07-08: qty 10

## 2014-07-08 MED ORDER — MEPERIDINE HCL 50 MG/ML IJ SOLN: 6.2500 mg | Freq: Once | INTRAMUSCULAR | Status: DC

## 2014-07-08 SURGICAL SUPPLY — 41 items
BAG HAMPER (MISCELLANEOUS) ×3 IMPLANT
BANDAGE ELASTIC 3 VELCRO NS (GAUZE/BANDAGES/DRESSINGS) ×3 IMPLANT
BANDAGE ESMARK 4X12 BL STRL LF (DISPOSABLE) ×1 IMPLANT
BLADE 15 SAFETY STRL DISP (BLADE) IMPLANT
BLADE SURG 15 STRL LF DISP TIS (BLADE) ×1 IMPLANT
BLADE SURG 15 STRL SS (BLADE) ×2
BNDG ESMARK 4X12 BLUE STRL LF (DISPOSABLE) ×3
CLOTH BEACON ORANGE TIMEOUT ST (SAFETY) ×3 IMPLANT
COVER LIGHT HANDLE STERIS (MISCELLANEOUS) ×6 IMPLANT
CUFF TOURNIQUET SINGLE 18IN (TOURNIQUET CUFF) IMPLANT
CUFF TOURNIQUET SINGLE 24IN (TOURNIQUET CUFF) ×3 IMPLANT
DRSG XEROFORM 1X8 (GAUZE/BANDAGES/DRESSINGS) ×3 IMPLANT
DURAPREP 26ML APPLICATOR (WOUND CARE) ×3 IMPLANT
ELECT NEEDLE TIP 2.8 STRL (NEEDLE) IMPLANT
ELECT REM PT RETURN 9FT ADLT (ELECTROSURGICAL) ×3
ELECTRODE REM PT RTRN 9FT ADLT (ELECTROSURGICAL) ×1 IMPLANT
FORMALIN 10 PREFIL 120ML (MISCELLANEOUS) ×3 IMPLANT
GAUZE SPONGE 4X4 12PLY STRL (GAUZE/BANDAGES/DRESSINGS) ×2 IMPLANT
GLOVE BIO SURGEON STRL SZ8 (GLOVE) ×3 IMPLANT
GLOVE BIO SURGEON STRL SZ8.5 (GLOVE) ×3 IMPLANT
GLOVE BIOGEL PI IND STRL 7.5 (GLOVE) ×1 IMPLANT
GLOVE BIOGEL PI INDICATOR 7.5 (GLOVE) ×2
GLOVE EXAM NITRILE MD LF STRL (GLOVE) ×3 IMPLANT
GLOVE SURG SS PI 7.5 STRL IVOR (GLOVE) ×3 IMPLANT
GOWN STRL REUS W/TWL LRG LVL3 (GOWN DISPOSABLE) ×6 IMPLANT
GOWN STRL REUS W/TWL XL LVL3 (GOWN DISPOSABLE) ×3 IMPLANT
KIT ROOM TURNOVER APOR (KITS) ×3 IMPLANT
NEEDLE HYPO 18GX1.5 BLUNT FILL (NEEDLE) ×3 IMPLANT
NEEDLE HYPO 27GX1-1/4 (NEEDLE) ×6 IMPLANT
NS IRRIG 1000ML POUR BTL (IV SOLUTION) ×3 IMPLANT
PACK BASIC LIMB (CUSTOM PROCEDURE TRAY) ×3 IMPLANT
PAD ARMBOARD 7.5X6 YLW CONV (MISCELLANEOUS) ×3 IMPLANT
PAD CAST 3X4 CTTN HI CHSV (CAST SUPPLIES) ×1 IMPLANT
PADDING CAST COTTON 3X4 STRL (CAST SUPPLIES) ×2
PADDING WEBRIL 3 STERILE (GAUZE/BANDAGES/DRESSINGS) ×3 IMPLANT
SET BASIN LINEN APH (SET/KITS/TRAYS/PACK) ×3 IMPLANT
SPONGE GAUZE 4X4 12PLY (GAUZE/BANDAGES/DRESSINGS) ×3 IMPLANT
SUT ETHILON 3 0 FSL (SUTURE) ×3 IMPLANT
SYR 3ML LL SCALE MARK (SYRINGE) ×3 IMPLANT
TOWEL OR 17X26 4PK STRL BLUE (TOWEL DISPOSABLE) ×3 IMPLANT
VESSEL LOOPS MAXI RED (MISCELLANEOUS) ×3 IMPLANT

## 2014-07-08 NOTE — Brief Op Note (Signed)
07/08/2014  9:49 AM  PATIENT:  [name]  49 y.o. female  PRE-OPERATIVE DIAGNOSIS:  carpal tunnel right  POST-OPERATIVE DIAGNOSIS:  carpal tunnel right  PROCEDURE:  Procedure(s): RIGHT CARPAL TUNNEL RELEASE (Right)  SURGEON:  Surgeon(s) and Role:    * Sanjuana Kava, MD - Primary  PHYSICIAN ASSISTANT:   ASSISTANTS: none   ANESTHESIA:   regional  EBL:  Total I/O In: 500 [I.V.:500] Out: 0   BLOOD ADMINISTERED:none  DRAINS: none   LOCAL MEDICATIONS USED:  NONE  SPECIMEN:  Source of Specimen:  right volar carpal ligament  DISPOSITION OF SPECIMEN:  PATHOLOGY  COUNTS:  YES  TOURNIQUET:   Total Tourniquet Time Documented: Upper Arm (Right) - 36 minutes Total: Upper Arm (Right) - 36 minutes   DICTATION: .Other Dictation: Dictation Number [id number]  PLAN OF CARE: Discharge to home after PACU  PATIENT DISPOSITION:  PACU - hemodynamically stable.   Delay start of Pharmacological VTE agent (>24hrs) due to surgical blood loss or risk of bleeding: not applicable

## 2014-07-08 NOTE — Progress Notes (Signed)
The History and Physical is unchanged. I have examined the patient. The patient is medically able to have surgery on the right volar wrist and hand . [name]

## 2014-07-08 NOTE — Anesthesia Preprocedure Evaluation (Signed)
Anesthesia Evaluation  Patient identified by MRN, date of birth, ID band Patient awake    Reviewed: Allergy & Precautions, H&P , NPO status , Patient's Chart, lab work & pertinent test results, reviewed documented beta blocker date and time   Airway Mallampati: III  TM Distance: >3 FB Neck ROM: Full    Dental no notable dental hx. (+) Teeth Intact   Pulmonary sleep apnea and Continuous Positive Airway Pressure Ventilation , Current Smoker, former smoker,  breath sounds clear to auscultation  Pulmonary exam normal       Cardiovascular hypertension, Pt. on medications and Pt. on home beta blockers Rhythm:Regular Rate:Normal     Neuro/Psych Seizures -, Well Controlled,  PSYCHIATRIC DISORDERS Anxiety Depression Bipolar Disorder    GI/Hepatic Neg liver ROS, GERD-  Medicated and Controlled,S/P Gastric Bypass   Endo/Other  Morbid obesity  Renal/GU negative Renal ROS  negative genitourinary   Musculoskeletal negative musculoskeletal ROS (+)   Abdominal (+) + obese,   Peds  Hematology Hx/o Blood Transfusion   Anesthesia Other Findings   Reproductive/Obstetrics negative OB ROS Irregular Menses Possible endometrial polyp                             Anesthesia Physical Anesthesia Plan  ASA: III  Anesthesia Plan: Bier Block   Post-op Pain Management:    Induction: Intravenous  Airway Management Planned: Simple Face Mask  Additional Equipment:   Intra-op Plan:   Post-operative Plan:   Informed Consent: I have reviewed the patients History and Physical, chart, labs and discussed the procedure including the risks, benefits and alternatives for the proposed anesthesia with the patient or authorized representative who has indicated his/her understanding and acceptance.     Plan Discussed with:   Anesthesia Plan Comments:         Anesthesia Quick Evaluation

## 2014-07-08 NOTE — Anesthesia Postprocedure Evaluation (Signed)
  Anesthesia Post-op Note  Patient: [name]  Procedure(s) Performed: Procedure(s): RIGHT CARPAL TUNNEL RELEASE (Right)  Patient Location: PACU  Anesthesia Type:Bier block  Level of Consciousness: awake, alert , oriented and patient cooperative  Airway and Oxygen Therapy: Patient Spontanous Breathing and Patient connected to face mask oxygen  Post-op Pain: none  Post-op Assessment: Post-op Vital signs reviewed, Patient's Cardiovascular Status Stable, Respiratory Function Stable, Patent Airway, No signs of Nausea or vomiting and Pain level controlled  Post-op Vital Signs: Reviewed and stable  Last Vitals:  Filed Vitals:   07/08/14 0945  BP: 150/78  Pulse:   Temp:   Resp: 16    Complications: No apparent anesthesia complications

## 2014-07-08 NOTE — Discharge Instructions (Signed)
Keep wound dry.  Keep bandages intact.  Move fingers often.  Elevate as needed.  Keep appointment with Dr. Luna Glasgow in one week.  Call his office if any problem, [phone], or if after hours, the hospital at [phone].  Carpal Tunnel Release (Repair), Care After Refer to this sheet in the next few weeks. These discharge instructions provide you with general information on caring for yourself after you leave the hospital. Your caregiver may also give you specific instructions. Your treatment has been planned according to the most current medical practices available, but unavoidable complications sometimes occur. If you have any problems or questions after discharge, please call your caregiver. HOME CARE INSTRUCTIONS   Have a responsible person with you for 24 hours.  Do not drive a car or take public transportation for 24 hours.  Only take over-the-counter or prescription medicines for pain, discomfort, or fever as directed by your caregiver. Take them as directed.  You may put ice on the palm side of the affected wrist.  Put ice in a plastic bag.  Place a towel between your skin and the bag.  Leave the ice on for 15-20 minutes, 03-04 times per day.  If you were given a splint to keep your wrist from bending, use it as directed. It is important to wear the splint at night or as directed. Use the splint for as long as you have pain or numbness in your hand, arm, or wrist. This may take 1 to 2 months.  Keep your hand raised (elevated) above the level of your heart as much as possible. This keeps swelling down and helps with discomfort.  Change bandages (dressings) as directed.  Keep the wound clean and dry. SEEK MEDICAL CARE IF:   You develop pain not relieved with medicines.  You develop numbness of your hand.  You develop bleeding from your surgical site.  You have an oral temperature above 102 F (38.9 C).  You develop redness or swelling of the surgical site.  You  develop new, unexplained problems. SEEK IMMEDIATE MEDICAL CARE IF:   You develop a rash.  You have difficulty breathing.  You develop any reaction or side effects to medicines given. MAKE SURE YOU:   Understand these instructions.  Will watch your condition.  Will get help right away if you are not doing well or get worse.

## 2014-07-08 NOTE — Transfer of Care (Signed)
Immediate Anesthesia Transfer of Care Note  Patient: [name]  Procedure(s) Performed: Procedure(s): RIGHT CARPAL TUNNEL RELEASE (Right)  Patient Location: PACU  Anesthesia Type:Bier block  Level of Consciousness: awake, alert , oriented and patient cooperative  Airway & Oxygen Therapy: Patient Spontanous Breathing and Patient connected to nasal cannula oxygen  Post-op Assessment: Report given to RN and Post -op Vital signs reviewed and stable  Post vital signs: Reviewed and stable  Last Vitals:  Filed Vitals:   07/08/14 0855  BP: 143/89  Pulse:   Temp:   Resp: 11    Complications: No apparent anesthesia complications

## 2014-07-08 NOTE — Anesthesia Procedure Notes (Addendum)
Procedure Name: MAC Date/Time: 07/08/2014 8:58 AM Performed by: Andree Elk, AMY A Pre-anesthesia Checklist: Patient identified, Timeout performed, Emergency Drugs available, Suction available and Patient being monitored Oxygen Delivery Method: Simple face mask   Anesthesia Regional Block:  Bier block (IV Regional)  Pre-Anesthetic Checklist: ,, timeout performed, Correct Patient, Correct Site, Correct Laterality, Correct Procedure,, site marked, surgical consent,, at surgeon's request  Laterality: Right     Needles:  Injection technique: Single-shot  Needle Type: Other      Needle Gauge: 22 and 22 G    Additional Needles: Bier block (IV Regional)  Nerve Stimulator or Paresthesia:   Additional Responses:  Pulse checked post tourniquet inflation. IV NSL discontinued post injection. Narrative:  Start time: 07/08/2014 9:14 AM  Performed by: Personally

## 2014-07-09 ENCOUNTER — Encounter (HOSPITAL_COMMUNITY): Payer: Self-pay | Admitting: Orthopaedic Surgery

## 2014-07-09 NOTE — Op Note (Signed)
[name], [name]             ACCOUNT NO.:  [id number]  MEDICAL RECORD NO.:  [id number]  LOCATION:                                 FACILITY:  PHYSICIAN:  J. Sanjuana Kava, M.D. DATE OF BIRTH:  1965/05/03  DATE OF PROCEDURE:  07/08/2014 DATE OF DISCHARGE:  07/08/2014                              OPERATIVE REPORT   PREOPERATIVE DIAGNOSIS:  Carpal tunnel syndrome on the right.  POSTOPERATIVE DIAGNOSIS:  Carpal tunnel syndrome on the right.  PROCEDURE:  Release of volar carpal ligament, saline neurolysis, epineurotomy in the right median nerve.  ANESTHESIA:  Regional anesthesia.  TOURNIQUET:  36 minutes.  DRAINS:  None.  SPLINT:  None.  INDICATIONS:  The patient has carpal tunnel syndrome bilaterally.  The right hand hurts more than the left.  An EMG is documented.  She has tried conservative treatment, wrist splints, and anti-inflammatories, but she continues to have pain and tenderness.  I have recommended surgery.  I went over the risks and imponderables of the procedure  with the patient.  She appears to understand the procedure as outlined.  She realizes this was an outpatient elective procedure.  She has elected to have the procedure done at this time.  DESCRIPTION OF PROCEDURE:  The patient was seen in the holding area, identified the right wrist of the hand as the area where we will do the surgery for carpal tunnel.  She placed a mark on the hand  too.  She was brought to the operating room and placed supine on the operating room table with the hand table attached.  She was given Bier block and regional anesthesia by the seizure personnel.  Once this was obtained, she was prepped and draped in the usual manner.  A generalized time-out identifying the patient as Ms Soulsby for doing carpal tunnel release on the right.  All instrumentation was properly positioned and working.  The OR team knew each other.  Outline for incision was made with careful dissection,  the median nerve was identified proximally.  Vessel loop was placed around the nerve.  A grooved director was then inserted and the volar carpal ligament was incised.  The  nerve was obviously compressed.  Saline neurolysis and epineurotomy was carried out without any apparent injury to the nerve. Retinaculum was cut proximally.  Vessel loop removed after inspecting the nerve and see no injury. The wound was reapproximated using 3-0 nylon interrupted vertical mattress manner.  Sterile dressing was applied and bulky dressing applied and sheet cotton applied, sheet cotton cut dorsally.  ACE bandage applied loosely.  The patient tolerated the procedure well and will go to recovery in good condition as an outpatient.          ______________________________ J. Sanjuana Kava, M.D.     JWK/MEDQ  D:  07/08/2014  T:  07/09/2014  Job:  [id number]

## 2014-08-05 ENCOUNTER — Ambulatory Visit (INDEPENDENT_AMBULATORY_CARE_PROVIDER_SITE_OTHER): Payer: Medicare Other | Admitting: Licensed Clinical Social Worker

## 2014-08-05 DIAGNOSIS — F4323 Adjustment disorder with mixed anxiety and depressed mood: Secondary | ICD-10-CM | POA: Diagnosis not present

## 2014-08-11 ENCOUNTER — Ambulatory Visit: Payer: Federal, State, Local not specified - PPO | Admitting: Family

## 2014-08-13 ENCOUNTER — Encounter: Payer: Self-pay | Admitting: Family

## 2014-08-13 ENCOUNTER — Ambulatory Visit (INDEPENDENT_AMBULATORY_CARE_PROVIDER_SITE_OTHER): Payer: Medicare Other | Admitting: Family

## 2014-08-13 VITALS — BP 124/72 | HR 85 | Temp 98.3°F | Ht 61.0 in | Wt 221.0 lb

## 2014-08-13 DIAGNOSIS — Z9884 Bariatric surgery status: Secondary | ICD-10-CM

## 2014-08-13 DIAGNOSIS — R635 Abnormal weight gain: Secondary | ICD-10-CM | POA: Diagnosis not present

## 2014-08-13 DIAGNOSIS — F3160 Bipolar disorder, current episode mixed, unspecified: Secondary | ICD-10-CM | POA: Diagnosis not present

## 2014-08-13 DIAGNOSIS — I10 Essential (primary) hypertension: Secondary | ICD-10-CM

## 2014-08-13 DIAGNOSIS — Z79899 Other long term (current) drug therapy: Secondary | ICD-10-CM | POA: Diagnosis not present

## 2014-08-13 MED ORDER — DIAZEPAM 10 MG PO TABS: 10.0000 mg | ORAL_TABLET | Freq: Three times a day (TID) | ORAL | Status: DC

## 2014-08-13 MED ORDER — TOPIRAMATE 100 MG PO TABS: 200.0000 mg | ORAL_TABLET | Freq: Every day | ORAL | Status: DC

## 2014-08-13 MED ORDER — DIAZEPAM 10 MG PO TABS: 10.0000 mg | ORAL_TABLET | Freq: Three times a day (TID) | ORAL | Status: DC | PRN

## 2014-08-13 MED ORDER — LOSARTAN POTASSIUM 25 MG PO TABS: 25.0000 mg | ORAL_TABLET | Freq: Every day | ORAL | Status: DC

## 2014-08-13 NOTE — Patient Instructions (Addendum)
Thank you for choosing [facility].  Summary/Instructions:  Please continue to take your medication as prescribed.  Your prescription(s) have been submitted to your pharmacy or been printed and provided for you. Please take as directed and contact our office if you believe you are having problem(s) with the medication(s) or have any questions.  Referrals have been made during this visit. You should expect to hear back from our schedulers in about 7-10 days in regards to establishing an appointment with the specialists we discussed.

## 2014-08-13 NOTE — Assessment & Plan Note (Signed)
Previous history of gastric bypass surgery and loss of 220 pounds. Indicates recent gain of 30 pounds. Patient requests to see a registered dietitian to discuss her dietary habits. Referral to dietitian placed.

## 2014-08-13 NOTE — Progress Notes (Signed)
Subjective:    Patient ID: [name], female    DOB: 1965/04/09, 49 y.o.   MRN: [id number]  Chief Complaint  Patient presents with  . Establish Care    HPI:  [name] is a 49 y.o. female with a PMH of bipolar disorder, hypertension, and  iron deficiency anemia  who presents today for an office visit to establish care.   1.) Bipolar disorder - Previously maintained by psychiatry and was "fired" by her provider because she did not seek care as instructed. She know is under the counseling of Dr. Sharol Roussel of [facility]. She is currently maintained on Venlafaxine, topamax and valium. She takes her medications as prescribed and denies adverse side effects. Indicates that her mood has been well maintained with these medications.   2) Weight - Previously had a gastric bypass surgery completed and lost 240 pounds with a starting weight of 460. She notes that she has gained 30 pounds recently and is not sure why. Averages about 1-2 meals per day. She has a protein shake in the morning and then a dinner with vegetables, starch and protein. Exercise consists of walking about 1/4 mile daily. Her activity is limited secondary to pain and arthritis.  3) Hypertension - Maintained on carvedilol, losartan and amlodipine. She takes her medications as prescribed and denies adverse side effects.   BP Readings from Last 3 Encounters:  08/13/14 124/72  07/08/14 134/97  01/17/14 102/68    Allergies  Allergen Reactions  . Ondansetron Nausea And Vomiting  . Sulfa Antibiotics Rash     Outpatient Prescriptions Prior to Visit  Medication Sig Dispense Refill  . AMITIZA 24 MCG capsule Take 24 mcg by mouth 2 (two) times daily.     Marland Kitchen amLODipine (NORVASC) 10 MG tablet Take 10 mg by mouth daily.    . carvedilol (COREG) 12.5 MG tablet Take 12.5 mg by mouth 2 (two) times daily with a meal.    . celecoxib (CELEBREX) 200 MG capsule Take 200 mg by mouth daily.    Marland Kitchen esomeprazole (NEXIUM) 40 MG  capsule Take 40 mg by mouth daily at 12 noon.    . Fe Fum-FePoly-FA-Vit C-Vit B3 (INTEGRA F) 125-1 MG CAPS Take 1 tablet by mouth daily.    . furosemide (LASIX) 20 MG tablet Take 20 mg by mouth daily.    Marland Kitchen lidocaine (LIDODERM) 5 % Place 1 patch onto the skin daily as needed (for pain). Remove & Discard patch within 12 hours or as directed by MD    . promethazine (PHENERGAN) 25 MG tablet Take 25 mg by mouth every 6 (six) hours as needed for nausea or vomiting.    Marland Kitchen tiZANidine (ZANAFLEX) 4 MG tablet Take 4 mg by mouth every 8 (eight) hours as needed for muscle spasms.    . traMADol (ULTRAM-ER) 200 MG 24 hr tablet Take 200 mg by mouth daily.    . Venlafaxine HCl 150 MG TB24 Take 150 mg by mouth 2 (two) times daily.    . diazepam (VALIUM) 10 MG tablet Take 10 mg by mouth 3 (three) times daily.   2  . losartan (COZAAR) 25 MG tablet Take 25 mg by mouth daily.    Marland Kitchen topiramate (TOPAMAX) 100 MG tablet Take 200 mg by mouth at bedtime.   1   No facility-administered medications prior to visit.     Past Medical History  Diagnosis Date  . IUD     HISTORY OF IUD --REMOVED IN 2006  .  Hypertension   . Anemia   . History of blood transfusion   . Anxiety   . Bipolar disorder   . Depression   . Seizures 2009,2012    two seizures due to a med changes     Past Surgical History  Procedure Laterality Date  . Rotator cuff repair    . Gastric bypass  2000  . Hysteroscopy w/d&c  11/14/2011    Procedure: DILATATION AND CURETTAGE /HYSTEROSCOPY;  Surgeon: Marylynn Pearson, MD;  Location: [facility];  Service: Gynecology;;  with removal of polyps  . Intrauterine device (iud) insertion  03/21/2010  . Carpal tunnel release Right 07/08/2014    Procedure: RIGHT CARPAL TUNNEL RELEASE;  Surgeon: Sanjuana Kava, MD;  Location: [facility];  Service: Orthopedics;  Laterality: Right;  . Cholecystectomy       Family History  Problem Relation Age of Onset  . Hypertension Mother   . Kidney failure Mother   . Hypertension  Father   . Diabetes Sister   . Hypertension Sister   . Hypertension Brother   . Hypertension Maternal Aunt   . Hypertension Maternal Uncle   . Hypertension Maternal Grandmother   . Hypertension Maternal Grandfather   . Hypertension Paternal Grandmother   . Hypertension Paternal Grandfather   . Diabetes Cousin      History   Social History  . Marital Status: Single    Spouse Name: N/A  . Number of Children: 0  . Years of Education: 14   Occupational History  . Retired    Social History Main Topics  . Smoking status: Former Smoker -- 0.25 packs/day for 4 years    Types: Cigarettes    Quit date: 03/21/1988  . Smokeless tobacco: Never Used  . Alcohol Use: Yes     Comment: social  . Drug Use: No  . Sexual Activity: No   Other Topics Concern  . Not on file   Social History Narrative   Fun: Puzzles, watch mystery shows, read   Denies religious beliefs effecting health care.     Review of Systems  Respiratory: Negative for chest tightness and shortness of breath.   Cardiovascular: Negative for chest pain, palpitations and leg swelling.  Neurological: Negative for headaches.  Psychiatric/Behavioral: Negative for suicidal ideas, behavioral problems and dysphoric mood. The patient is not nervous/anxious and is not hyperactive.       Objective:    BP 124/72 mmHg  Pulse 85  Temp(Src) 98.3 F (36.8 C) (Oral)  Ht 5\' 1"  (1.549 m)  Wt 221 lb (100.245 kg)  BMI 41.78 kg/m2  SpO2 96% Nursing note and vital signs reviewed.  Physical Exam  Constitutional: She is oriented to person, place, and time. She appears well-developed and well-nourished. No distress.  Cardiovascular: Normal rate, regular rhythm, normal heart sounds and intact distal pulses.   Pulmonary/Chest: Effort normal and breath sounds normal.  Neurological: She is alert and oriented to person, place, and time.  Skin: Skin is warm and dry.  Psychiatric: She has a normal mood and affect. Her behavior is  normal. Judgment and thought content normal.       Assessment & Plan:   Problem List Items Addressed This Visit      Cardiovascular and Mediastinum   Essential hypertension, benign    Hypertension remains well controlled with current regimen and below goal of 140/90. Continue current dosages of carvedilol, amlodipine, and losartan. Losartan refilled.      Relevant Medications   losartan (COZAAR) 25 MG tablet  Other   Bipolar disorder - Primary    Stable with current regimen and no adverse side effects. Continue current dosages of venlafaxine, Topamax, and volume. Urine drug screen obtained and controlled substance contract signed and [place] Controlled Substance Database reviewed.       Relevant Medications   topiramate (TOPAMAX) 100 MG tablet   diazepam (VALIUM) 10 MG tablet   Weight gain following gastric bypass surgery    Previous history of gastric bypass surgery and loss of 220 pounds. Indicates recent gain of 30 pounds. Patient requests to see a registered dietitian to discuss her dietary habits. Referral to dietitian placed.      Relevant Orders   Ambulatory referral to Nutrition and Diabetic Education

## 2014-08-13 NOTE — Assessment & Plan Note (Signed)
Hypertension remains well controlled with current regimen and below goal of 140/90. Continue current dosages of carvedilol, amlodipine, and losartan. Losartan refilled.

## 2014-08-13 NOTE — Assessment & Plan Note (Addendum)
Stable with current regimen and no adverse side effects. Continue current dosages of venlafaxine, Topamax, and volume. Urine drug screen obtained and controlled substance contract signed and [place] Controlled Substance Database reviewed.

## 2014-08-19 ENCOUNTER — Ambulatory Visit (INDEPENDENT_AMBULATORY_CARE_PROVIDER_SITE_OTHER): Payer: Medicare Other | Admitting: Licensed Clinical Social Worker

## 2014-08-19 DIAGNOSIS — F4322 Adjustment disorder with anxiety: Secondary | ICD-10-CM | POA: Diagnosis not present

## 2014-08-26 ENCOUNTER — Ambulatory Visit: Payer: Medicare Other | Admitting: Licensed Clinical Social Worker

## 2014-09-03 ENCOUNTER — Ambulatory Visit (INDEPENDENT_AMBULATORY_CARE_PROVIDER_SITE_OTHER): Payer: Medicare Other | Admitting: Licensed Clinical Social Worker

## 2014-09-03 DIAGNOSIS — F4322 Adjustment disorder with anxiety: Secondary | ICD-10-CM | POA: Diagnosis not present

## 2014-09-09 ENCOUNTER — Telehealth: Payer: Self-pay | Admitting: Family

## 2014-09-09 DIAGNOSIS — F3160 Bipolar disorder, current episode mixed, unspecified: Secondary | ICD-10-CM

## 2014-09-09 MED ORDER — DIAZEPAM 10 MG PO TABS: 10.0000 mg | ORAL_TABLET | Freq: Three times a day (TID) | ORAL | Status: DC | PRN

## 2014-09-09 NOTE — Telephone Encounter (Signed)
Medication printed and dated.

## 2014-09-09 NOTE — Telephone Encounter (Signed)
Pt called in and needs refill on her diazepam (VALIUM) 10 MG tablet [id number]

## 2014-09-09 NOTE — Telephone Encounter (Signed)
Rx faxed to pharmacy called pt and LVM letting her know.

## 2014-09-23 ENCOUNTER — Ambulatory Visit: Payer: Medicare Other | Admitting: Licensed Clinical Social Worker

## 2014-10-06 ENCOUNTER — Telehealth: Payer: Self-pay | Admitting: Family

## 2014-10-06 NOTE — Telephone Encounter (Signed)
Pt called in and needs refill on her Venlafaxine HCl 150 MG TB24 [id number]  To [place] on file

## 2014-10-07 MED ORDER — VENLAFAXINE HCL ER 150 MG PO TB24: 150.0000 mg | ORAL_TABLET | Freq: Every day | ORAL | Status: DC

## 2014-10-07 NOTE — Telephone Encounter (Signed)
Patient calling back regarding prescription

## 2014-10-07 NOTE — Telephone Encounter (Signed)
Medication refilled

## 2014-10-08 NOTE — Telephone Encounter (Signed)
Patient picked up her prescription for Venlafaxine and it was written up for 1 a day and she needs to take 2 a day.  Please advise

## 2014-10-09 MED ORDER — VENLAFAXINE HCL ER 150 MG PO CP24: 150.0000 mg | ORAL_CAPSULE | Freq: Two times a day (BID) | ORAL | Status: DC

## 2014-10-09 NOTE — Telephone Encounter (Signed)
Pt states that her bottle says take 1 pill (150 mg) 2 times daily. She states that is always how it has been written and she doesn't understand why it has to change all the sudden.

## 2014-10-09 NOTE — Telephone Encounter (Signed)
Please call patient to verify previous dose as 225 mg is the usual  maximum recommended dose of Venlafaxine and her current places her at 300.

## 2014-10-09 NOTE — Telephone Encounter (Signed)
I wanted to ensure the dose that she was on. I have sent a new prescription to the pharmacy.

## 2014-10-09 NOTE — Addendum Note (Signed)
Addended by: Mauricio Po D on: 10/09/2014 02:58 PM   Modules accepted: Orders, Medications

## 2014-10-09 NOTE — Telephone Encounter (Signed)
LVM letting pt know.  

## 2014-10-13 ENCOUNTER — Telehealth: Payer: Self-pay | Admitting: Family

## 2014-10-13 DIAGNOSIS — F3160 Bipolar disorder, current episode mixed, unspecified: Secondary | ICD-10-CM

## 2014-10-13 NOTE — Telephone Encounter (Signed)
Patient requesting refill for diazepam (VALIUM) 10 MG tablet [id number].  Pharmacy is [facility]

## 2014-10-15 MED ORDER — DIAZEPAM 10 MG PO TABS: 10.0000 mg | ORAL_TABLET | Freq: Three times a day (TID) | ORAL | Status: DC | PRN

## 2014-10-15 NOTE — Addendum Note (Signed)
Addended by: Mauricio Po D on: 10/15/2014 08:09 AM   Modules accepted: Orders

## 2014-10-15 NOTE — Telephone Encounter (Signed)
Medication refilled

## 2014-10-20 DIAGNOSIS — G5601 Carpal tunnel syndrome, right upper limb: Secondary | ICD-10-CM | POA: Diagnosis not present

## 2014-10-20 DIAGNOSIS — I10 Essential (primary) hypertension: Secondary | ICD-10-CM | POA: Diagnosis not present

## 2014-10-20 DIAGNOSIS — Z6841 Body Mass Index (BMI) 40.0 and over, adult: Secondary | ICD-10-CM | POA: Diagnosis not present

## 2014-10-21 ENCOUNTER — Ambulatory Visit (INDEPENDENT_AMBULATORY_CARE_PROVIDER_SITE_OTHER): Payer: Medicare Other | Admitting: Licensed Clinical Social Worker

## 2014-10-21 DIAGNOSIS — F4322 Adjustment disorder with anxiety: Secondary | ICD-10-CM

## 2014-10-23 ENCOUNTER — Telehealth: Payer: Self-pay | Admitting: *Deleted

## 2014-10-23 MED ORDER — CARVEDILOL 12.5 MG PO TABS: 12.5000 mg | ORAL_TABLET | Freq: Two times a day (BID) | ORAL | Status: DC

## 2014-10-23 NOTE — Telephone Encounter (Signed)
Received call pt is needing refill on her carvedilol 12.5. Verified pharamcy inform pt will send to [facility] Rx sent electronically.Marland KitchenJohny Chess

## 2014-11-03 ENCOUNTER — Ambulatory Visit: Payer: Medicare Other | Admitting: Family

## 2014-11-05 DIAGNOSIS — I10 Essential (primary) hypertension: Secondary | ICD-10-CM | POA: Diagnosis not present

## 2014-11-05 DIAGNOSIS — Z6841 Body Mass Index (BMI) 40.0 and over, adult: Secondary | ICD-10-CM | POA: Diagnosis not present

## 2014-11-05 DIAGNOSIS — G5601 Carpal tunnel syndrome, right upper limb: Secondary | ICD-10-CM | POA: Diagnosis not present

## 2014-11-05 DIAGNOSIS — L910 Hypertrophic scar: Secondary | ICD-10-CM | POA: Diagnosis not present

## 2014-11-07 ENCOUNTER — Telehealth: Payer: Self-pay | Admitting: Family

## 2014-11-07 NOTE — Telephone Encounter (Signed)
Noted  

## 2014-11-07 NOTE — Telephone Encounter (Signed)
Ok to reschedule if she follows up.

## 2014-11-07 NOTE — Telephone Encounter (Signed)
Patient no showed on 8/15 for a follow up.  Please advise.

## 2014-11-10 ENCOUNTER — Ambulatory Visit (INDEPENDENT_AMBULATORY_CARE_PROVIDER_SITE_OTHER): Payer: Medicare Other | Admitting: Licensed Clinical Social Worker

## 2014-11-10 DIAGNOSIS — N7011 Chronic salpingitis: Secondary | ICD-10-CM | POA: Diagnosis not present

## 2014-11-10 DIAGNOSIS — F4322 Adjustment disorder with anxiety: Secondary | ICD-10-CM

## 2014-11-10 DIAGNOSIS — N8320 Unspecified ovarian cysts: Secondary | ICD-10-CM | POA: Diagnosis not present

## 2014-11-18 ENCOUNTER — Encounter: Payer: Self-pay | Admitting: Family

## 2014-11-18 ENCOUNTER — Ambulatory Visit (INDEPENDENT_AMBULATORY_CARE_PROVIDER_SITE_OTHER): Payer: Medicare Other | Admitting: Family

## 2014-11-18 VITALS — BP 132/104 | HR 71 | Temp 97.9°F | Resp 16 | Ht 61.0 in | Wt 213.0 lb

## 2014-11-18 DIAGNOSIS — R059 Cough, unspecified: Secondary | ICD-10-CM

## 2014-11-18 DIAGNOSIS — F3160 Bipolar disorder, current episode mixed, unspecified: Secondary | ICD-10-CM

## 2014-11-18 DIAGNOSIS — R05 Cough: Secondary | ICD-10-CM | POA: Diagnosis not present

## 2014-11-18 MED ORDER — DIAZEPAM 10 MG PO TABS: 10.0000 mg | ORAL_TABLET | Freq: Three times a day (TID) | ORAL | Status: DC | PRN

## 2014-11-18 MED ORDER — AZITHROMYCIN 250 MG PO TABS: ORAL_TABLET | ORAL | Status: DC

## 2014-11-18 MED ORDER — HYDROCODONE-HOMATROPINE 5-1.5 MG/5ML PO SYRP: 5.0000 mL | ORAL_SOLUTION | Freq: Three times a day (TID) | ORAL | Status: DC | PRN

## 2014-11-18 NOTE — Progress Notes (Signed)
Subjective:    Patient ID: [name], female    DOB: [birth year], 49 y.o.   MRN: [id number]  Chief Complaint  Patient presents with  . Cough    coughing so much that her side started hurting and head started hurting, then felt like she was losing her balance, congestion, productive cough    HPI:  [name] is a 49 y.o. female with a PMH of B12 deficiency, iron deficiency anemia, hypertension, bipolar, and abdominal pain who presents today for acute visit.   This is a new problem. Associated symptoms of productive cough with purlulent and congestion has been going on for about 5 days. Modifying factors include over the counter allergy medication and robutussin which helped a little. Denies fevers, chills or sore throat. Severity of cough is enough to disturb her sleep and also result in mild side and head pain. Denies recent antibiotics.  Allergies  Allergen Reactions  . Ondansetron Nausea And Vomiting  . Sulfa Antibiotics Rash    Current Outpatient Prescriptions on File Prior to Visit  Medication Sig Dispense Refill  . AMITIZA 24 MCG capsule Take 24 mcg by mouth 2 (two) times daily.     Marland Kitchen amLODipine (NORVASC) 10 MG tablet Take 10 mg by mouth daily.    . carvedilol (COREG) 12.5 MG tablet Take 1 tablet (12.5 mg total) by mouth 2 (two) times daily with a meal. 60 tablet 5  . celecoxib (CELEBREX) 200 MG capsule Take 200 mg by mouth daily.    Marland Kitchen esomeprazole (NEXIUM) 40 MG capsule Take 40 mg by mouth daily at 12 noon.    . Fe Fum-FePoly-FA-Vit C-Vit B3 (INTEGRA F) 125-1 MG CAPS Take 1 tablet by mouth daily.    . furosemide (LASIX) 20 MG tablet Take 20 mg by mouth daily.    Marland Kitchen lidocaine (LIDODERM) 5 % Place 1 patch onto the skin daily as needed (for pain). Remove & Discard patch within 12 hours or as directed by MD    . losartan (COZAAR) 25 MG tablet Take 1 tablet (25 mg total) by mouth daily. 90 tablet 0  . promethazine (PHENERGAN) 25 MG tablet Take 25 mg by mouth every 6  (six) hours as needed for nausea or vomiting.    Marland Kitchen tiZANidine (ZANAFLEX) 4 MG tablet Take 4 mg by mouth every 8 (eight) hours as needed for muscle spasms.    Marland Kitchen topiramate (TOPAMAX) 100 MG tablet Take 2 tablets (200 mg total) by mouth at bedtime. 60 tablet 3  . traMADol (ULTRAM-ER) 200 MG 24 hr tablet Take 200 mg by mouth daily.    Marland Kitchen venlafaxine XR (EFFEXOR-XR) 150 MG 24 hr capsule Take 1 capsule (150 mg total) by mouth 2 (two) times daily. 60 capsule 0   No current facility-administered medications on file prior to visit.     Review of Systems  Constitutional: Positive for fever and chills.  HENT: Positive for congestion. Negative for ear pain, facial swelling, postnasal drip, sinus pressure and sore throat.   Respiratory: Positive for cough. Negative for chest tightness and shortness of breath.   Neurological: Positive for headaches.      Objective:    BP 132/104 mmHg  Pulse 71  Temp(Src) 97.9 F (36.6 C) (Oral)  Resp 16  Ht 5\' 1"  (1.549 m)  Wt 213 lb (96.616 kg)  BMI 40.27 kg/m2  SpO2 97% Nursing note and vital signs reviewed.  Physical Exam  Constitutional: She is oriented to person, place, and time. She  appears well-developed and well-nourished. No distress.  HENT:  Right Ear: Hearing, tympanic membrane, external ear and ear canal normal.  Left Ear: Hearing, tympanic membrane, external ear and ear canal normal.  Nose: Nose normal. Right sinus exhibits no maxillary sinus tenderness and no frontal sinus tenderness. Left sinus exhibits no maxillary sinus tenderness and no frontal sinus tenderness.  Mouth/Throat: Uvula is midline, oropharynx is clear and moist and mucous membranes are normal.  Cardiovascular: Normal rate, regular rhythm, normal heart sounds and intact distal pulses.   Pulmonary/Chest: Effort normal and breath sounds normal.  Neurological: She is alert and oriented to person, place, and time.  Skin: Skin is warm and dry.  Psychiatric: She has a normal mood and  affect. Her behavior is normal. Judgment and thought content normal.       Assessment & Plan:   Problem List Items Addressed This Visit      Other   Bipolar disorder    Requests refill of Valium.      Relevant Medications   diazepam (VALIUM) 10 MG tablet   Cough - Primary    Symptoms and exam consistent with acute bronchitis. Start azithromycin. Start Hycodan as needed for cough and sleep. Continue over-the-counter medications as needed for symptom relief and supportive care. Follow-up if symptoms worsen or fail to improve.      Relevant Medications   azithromycin (ZITHROMAX) 250 MG tablet   HYDROcodone-homatropine (HYCODAN) 5-1.5 MG/5ML syrup

## 2014-11-18 NOTE — Progress Notes (Signed)
Pre visit review using our clinic review tool, if applicable. No additional management support is needed unless otherwise documented below in the visit note. 

## 2014-11-18 NOTE — Assessment & Plan Note (Signed)
Symptoms and exam consistent with acute bronchitis. Start azithromycin. Start Hycodan as needed for cough and sleep. Continue over-the-counter medications as needed for symptom relief and supportive care. Follow-up if symptoms worsen or fail to improve.

## 2014-11-18 NOTE — Patient Instructions (Signed)
Thank you for choosing [facility].  Summary/Instructions:  Your prescription(s) have been submitted to your pharmacy or been printed and provided for you. Please take as directed and contact our office if you believe you are having problem(s) with the medication(s) or have any questions.  If your symptoms worsen or fail to improve, please contact our office for further instruction, or in case of emergency go directly to the emergency room at the closest medical facility.   General Recommendations:    Please drink plenty of fluids.  Get plenty of rest   Sleep in humidified air  Use saline nasal sprays  Netti pot   OTC Medications:  Decongestants - helps relieve congestion   Flonase (generic fluticasone) or Nasacort (generic triamcinolone) - please make sure to use the "cross-over" technique at a 45 degree angle towards the opposite eye as opposed to straight up the nasal passageway.   Sudafed (generic pseudoephedrine - Note this is the one that is available behind the pharmacy counter); Products with phenylephrine (-PE) may also be used but is often not as effective as pseudoephedrine.   If you have HIGH BLOOD PRESSURE - Coricidin HBP; AVOID any product that is -D as this contains pseudoephedrine which may increase your blood pressure.  Afrin (oxymetazoline) every 6-8 hours for up to 3 days.   Allergies - helps relieve runny nose, itchy eyes and sneezing   Claritin (generic loratidine), Allegra (fexofenidine), or Zyrtec (generic cyrterizine) for runny nose. These medications should not cause drowsiness.  Note - Benadryl (generic diphenhydramine) may be used however may cause drowsiness  Cough -   Delsym or Robitussin (generic dextromethorphan)  Expectorants - helps loosen mucus to ease removal   Mucinex (generic guaifenesin) as directed on the package.  Headaches / General Aches   Tylenol (generic acetaminophen) - DO NOT EXCEED 3 grams (3,000 mg) in a 24  hour time period  Advil/Motrin (generic ibuprofen)   Sore Throat -   Salt water gargle   Chloraseptic (generic benzocaine) spray or lozenges / Sucrets (generic dyclonine)      

## 2014-11-18 NOTE — Assessment & Plan Note (Signed)
Requests refill of Valium.

## 2014-12-01 ENCOUNTER — Other Ambulatory Visit: Payer: Self-pay | Admitting: Family

## 2014-12-08 ENCOUNTER — Ambulatory Visit: Payer: Medicare Other | Admitting: Licensed Clinical Social Worker

## 2014-12-17 ENCOUNTER — Ambulatory Visit (INDEPENDENT_AMBULATORY_CARE_PROVIDER_SITE_OTHER): Payer: Medicare Other | Admitting: Licensed Clinical Social Worker

## 2014-12-17 DIAGNOSIS — F4322 Adjustment disorder with anxiety: Secondary | ICD-10-CM | POA: Diagnosis not present

## 2014-12-19 ENCOUNTER — Other Ambulatory Visit: Payer: Self-pay | Admitting: Family

## 2014-12-22 ENCOUNTER — Telehealth: Payer: Self-pay | Admitting: *Deleted

## 2014-12-22 DIAGNOSIS — F3160 Bipolar disorder, current episode mixed, unspecified: Secondary | ICD-10-CM

## 2014-12-22 MED ORDER — DIAZEPAM 10 MG PO TABS
10.0000 mg | ORAL_TABLET | Freq: Three times a day (TID) | ORAL | Status: DC | PRN
Start: 2014-12-22 — End: 2015-01-19

## 2014-12-22 NOTE — Telephone Encounter (Signed)
Medication refilled

## 2014-12-22 NOTE — Telephone Encounter (Signed)
Notified pt rx fax to pharmacy.../lmb 

## 2014-12-22 NOTE — Telephone Encounter (Signed)
Received call pt is requesting refill on her Valium. Pls advise...[name]

## 2015-01-05 ENCOUNTER — Ambulatory Visit (INDEPENDENT_AMBULATORY_CARE_PROVIDER_SITE_OTHER): Payer: Medicare Other | Admitting: Family

## 2015-01-05 ENCOUNTER — Encounter: Payer: Self-pay | Admitting: Family

## 2015-01-05 ENCOUNTER — Telehealth: Payer: Self-pay | Admitting: Family

## 2015-01-05 ENCOUNTER — Other Ambulatory Visit (INDEPENDENT_AMBULATORY_CARE_PROVIDER_SITE_OTHER): Payer: Medicare Other

## 2015-01-05 VITALS — BP 112/90 | Temp 98.6°F | Resp 16 | Ht 61.0 in | Wt 203.0 lb

## 2015-01-05 DIAGNOSIS — R42 Dizziness and giddiness: Secondary | ICD-10-CM | POA: Diagnosis not present

## 2015-01-05 DIAGNOSIS — R51 Headache: Secondary | ICD-10-CM

## 2015-01-05 DIAGNOSIS — R7989 Other specified abnormal findings of blood chemistry: Secondary | ICD-10-CM

## 2015-01-05 DIAGNOSIS — R519 Headache, unspecified: Secondary | ICD-10-CM

## 2015-01-05 LAB — CBC WITH DIFFERENTIAL/PLATELET
Basophils Absolute: 0 10*3/uL (ref 0.0–0.1)
Basophils Relative: 0.6 % (ref 0.0–3.0)
Eosinophils Absolute: 0 10*3/uL (ref 0.0–0.7)
Eosinophils Relative: 0.8 % (ref 0.0–5.0)
HCT: 45.5 % (ref 36.0–46.0)
Hemoglobin: 14.9 g/dL (ref 12.0–15.0)
Lymphocytes Relative: 32.8 % (ref 12.0–46.0)
Lymphs Abs: 1.4 10*3/uL (ref 0.7–4.0)
MCHC: 32.7 g/dL (ref 30.0–36.0)
MCV: 88.3 fl (ref 78.0–100.0)
Monocytes Absolute: 0.5 10*3/uL (ref 0.1–1.0)
Monocytes Relative: 11.4 % (ref 3.0–12.0)
Neutro Abs: 2.4 10*3/uL (ref 1.4–7.7)
Neutrophils Relative %: 54.4 % (ref 43.0–77.0)
Platelets: 137 10*3/uL — ABNORMAL LOW (ref 150.0–400.0)
RBC: 5.15 Mil/uL — ABNORMAL HIGH (ref 3.87–5.11)
RDW: 15.2 % (ref 11.5–15.5)
WBC: 4.3 10*3/uL (ref 4.0–10.5)

## 2015-01-05 LAB — COMPREHENSIVE METABOLIC PANEL
ALT: 11 U/L (ref 0–35)
AST: 16 U/L (ref 0–37)
Albumin: 4.1 g/dL (ref 3.5–5.2)
Alkaline Phosphatase: 163 U/L — ABNORMAL HIGH (ref 39–117)
BUN: 40 mg/dL — ABNORMAL HIGH (ref 6–23)
CO2: 24 mEq/L (ref 19–32)
Calcium: 8.4 mg/dL (ref 8.4–10.5)
Chloride: 109 mEq/L (ref 96–112)
Creatinine, Ser: 3.33 mg/dL — ABNORMAL HIGH (ref 0.40–1.20)
GFR: 18.93 mL/min — ABNORMAL LOW (ref >60.00)
Glucose, Bld: 85 mg/dL (ref 70–99)
Potassium: 3.8 mEq/L (ref 3.5–5.1)
Sodium: 143 mEq/L (ref 135–145)
Total Bilirubin: 0.5 mg/dL (ref 0.2–1.2)
Total Protein: 7.2 g/dL (ref 6.0–8.3)

## 2015-01-05 NOTE — Patient Instructions (Addendum)
Thank you for choosing [facility].  Summary/Instructions:  Please stop by the lab on the basement level of the building for your blood work. Your results will be released to [facility] (or called to you) after review, usually within 72 hours after test completion. If any changes need to be made, you will be notified at that same time.  Referrals have been made during this visit. You should expect to hear back from our schedulers in about 7-10 days in regards to establishing an appointment with the specialists we discussed.   If your symptoms worsen or fail to improve, please contact our office for further instruction, or in case of emergency go directly to the emergency room at the closest medical facility.   Change positions slowly.  Decrease carvedilol to 6.25 mg (0.5 tablets) 2 times per day.

## 2015-01-05 NOTE — Assessment & Plan Note (Signed)
Mixed sensation of questionable origin however cardiovascular unlikely source per patient previous testing. Neurological exam is benign today. Given previous history will obtain CT scan to rule out intracranial pathology. Obtain CMET and CBC with diff to rule out metabolic or infectious source. Refer to neurology for further examination. Decrease carvedilol to 6.25 mg twice daily to increase pressure slighlty. Follow up pending lab work and imaging. Advised to seek further care if symptoms worsen prior to then.

## 2015-01-05 NOTE — Progress Notes (Signed)
Pre visit review using our clinic review tool, if applicable. No additional management support is needed unless otherwise documented below in the visit note. 

## 2015-01-05 NOTE — Assessment & Plan Note (Signed)
Question if headaches are the cause or a symptom. Previous head trauma with benign neuro exam. Continue OTC medications as needed for discomfort. Follow up with neurology.

## 2015-01-05 NOTE — Progress Notes (Signed)
Subjective:    Patient ID: [name], female    DOB: May 20, 1965, 49 y.o.   MRN: [id number]  Chief Complaint  Patient presents with  . Dizziness    [name] 3 years ago she fell and hit her head, within the last year has had issues where she would pass out, fall, stumble, for the past couple months has been having dizzy spells, said she fell getting in the tub the other day    HPI:  [name] is a 49 y.o. female who  has a past medical history of IUD; Hypertension; Anemia; History of blood transfusion; Anxiety; Bipolar disorder ([place]); Depression; and Seizures ([place]) [id number]). and presents today for an office visit.   Associated symptoms of stumbling, falling and possible syncope have been going on for the past couple of months. Also describes a headache that is undescribable. Modifying factors include walking with a walking stick. Describes several episodes of "falling over" without dizziness or lightheadness. Explains a history of a fall about 3 years ago where she struck her head without loss of consciousness and was treated for vertigo with initial symptom improvements. Episodes of increased frequency. Notes the room spins on occasion only when lying down. Denies dizziness or lightheadedness with changes of position. Notes that she fell the other day in the tub while she was standing straight up. Denies changes in medications. Timing of the symptoms is generally worse in the morning, but occurs throughout the day. Denies any treatments or tests that would be done. Has been seen by cardiology and reports that her stress test and 2D echocardiogram were normal.   Allergies  Allergen Reactions  . Ondansetron Nausea And Vomiting  . Sulfa Antibiotics Rash     Current Outpatient Prescriptions on File Prior to Visit  Medication Sig Dispense Refill  . AMITIZA 24 MCG capsule Take 24 mcg by mouth 2 (two) times daily.     Marland Kitchen amLODipine (NORVASC) 10 MG tablet Take 10 mg by mouth daily.     Marland Kitchen azithromycin (ZITHROMAX) 250 MG tablet Take 2 tablets for 1 day and then 1 tablet daily for 4 days 6 tablet 0  . carvedilol (COREG) 12.5 MG tablet Take 1 tablet (12.5 mg total) by mouth 2 (two) times daily with a meal. 60 tablet 5  . celecoxib (CELEBREX) 200 MG capsule Take 200 mg by mouth daily.    . diazepam (VALIUM) 10 MG tablet Take 1 tablet (10 mg total) by mouth 3 (three) times daily as needed for anxiety. 90 tablet 0  . esomeprazole (NEXIUM) 40 MG capsule Take 40 mg by mouth daily at 12 noon.    . Fe Fum-FePoly-FA-Vit C-Vit B3 (INTEGRA F) 125-1 MG CAPS Take 1 tablet by mouth daily.    . furosemide (LASIX) 20 MG tablet Take 20 mg by mouth daily.    Marland Kitchen HYDROcodone-homatropine (HYCODAN) 5-1.5 MG/5ML syrup Take 5 mLs by mouth every 8 (eight) hours as needed for cough. 120 mL 0  . lidocaine (LIDODERM) 5 % Place 1 patch onto the skin daily as needed (for pain). Remove & Discard patch within 12 hours or as directed by MD    . losartan (COZAAR) 25 MG tablet TAKE ONE TABLET BY MOUTH ONCE DAILY. 90 tablet 0  . promethazine (PHENERGAN) 25 MG tablet Take 25 mg by mouth every 6 (six) hours as needed for nausea or vomiting.    Marland Kitchen tiZANidine (ZANAFLEX) 4 MG tablet Take 4 mg by mouth every 8 (eight) hours as  needed for muscle spasms.    Marland Kitchen topiramate (TOPAMAX) 100 MG tablet TAKE (2) TABLETS BY MOUTH AT BEDTIME. 60 tablet 0  . traMADol (ULTRAM-ER) 200 MG 24 hr tablet Take 200 mg by mouth daily.    Marland Kitchen venlafaxine XR (EFFEXOR-XR) 150 MG 24 hr capsule TAKE ONE CAPSULE BY MOUTH TWICE DAILY. 60 capsule 5   No current facility-administered medications on file prior to visit.     Past Surgical History  Procedure Laterality Date  . Rotator cuff repair    . Gastric bypass  2000  . Hysteroscopy w/d&c  11/14/2011    Procedure: DILATATION AND CURETTAGE /HYSTEROSCOPY;  Surgeon: Marylynn Pearson, MD;  Location: [facility];  Service: Gynecology;;  with removal of polyps  . Intrauterine device (iud) insertion  03/21/2010    . Carpal tunnel release Right 07/08/2014    Procedure: RIGHT CARPAL TUNNEL RELEASE;  Surgeon: Sanjuana Kava, MD;  Location: [facility];  Service: Orthopedics;  Laterality: Right;  . Cholecystectomy       Review of Systems  Constitutional: Positive for fatigue. Negative for fever and chills.  Respiratory: Negative for chest tightness and shortness of breath.   Cardiovascular: Negative for chest pain, palpitations and leg swelling.  Gastrointestinal: Positive for nausea. Negative for vomiting.  Neurological: Positive for syncope and headaches. Negative for dizziness and light-headedness.      Objective:    BP 112/90 mmHg  Temp(Src) 98.6 F (37 C) (Oral)  Resp 16  Ht 5\' 1"  (1.549 m)  Wt 203 lb (92.08 kg)  BMI 38.38 kg/m2 Nursing note and vital signs reviewed.  Physical Exam  Constitutional: She is oriented to person, place, and time. She appears well-developed and well-nourished. No distress.  HENT:  Right Ear: Hearing, tympanic membrane, external ear and ear canal normal.  Left Ear: Hearing, tympanic membrane, external ear and ear canal normal.  Nose: Right sinus exhibits no maxillary sinus tenderness and no frontal sinus tenderness. Left sinus exhibits no maxillary sinus tenderness and no frontal sinus tenderness.  Mouth/Throat: Uvula is midline, oropharynx is clear and moist and mucous membranes are normal.  Cardiovascular: Normal rate, regular rhythm, normal heart sounds and intact distal pulses.   Mild symptoms noted from lying to sitting - patient indicates not completely same feeling.   Pulmonary/Chest: Effort normal and breath sounds normal.  Neurological: She is alert and oriented to person, place, and time. No cranial nerve deficit. Coordination normal.  Eye fluttering noted when patient is lying flat.   Skin: Skin is warm and dry.  Psychiatric: She has a normal mood and affect. Her behavior is normal. Judgment and thought content normal.       Assessment & Plan:    Problem List Items Addressed This Visit      Other   Generalized headaches - Primary    Question if headaches are the cause or a symptom. Previous head trauma with benign neuro exam. Continue OTC medications as needed for discomfort. Follow up with neurology.       Relevant Orders   CT Head Wo Contrast   Comprehensive metabolic panel (Completed)   Ambulatory referral to Neurology   CBC w/Diff (Completed)   Dizziness    Mixed sensation of questionable origin however cardiovascular unlikely source per patient previous testing. Neurological exam is benign today. Given previous history will obtain CT scan to rule out intracranial pathology. Obtain CMET and CBC with diff to rule out metabolic or infectious source. Refer to neurology for further examination. Decrease carvedilol to  6.25 mg twice daily to increase pressure slighlty. Follow up pending lab work and imaging. Advised to seek further care if symptoms worsen prior to then.       Relevant Orders   CT Head Wo Contrast   Comprehensive metabolic panel (Completed)   Ambulatory referral to Neurology   CBC w/Diff (Completed)

## 2015-01-05 NOTE — Telephone Encounter (Signed)
Please inform patient:  Your blood work shows no evidence of infection that would lead to your symptoms. However your kidney function is significantly decreased which is very concerning. Therefore I would like you to stop by the lab in the next day or so for repeat testing to confirm the results. I am going to place a referral to nephrology in the meantime and can cancel if your lab work returns normal.

## 2015-01-07 NOTE — Telephone Encounter (Signed)
LVM for pt to call back.

## 2015-01-13 ENCOUNTER — Telehealth: Payer: Self-pay | Admitting: Family

## 2015-01-13 NOTE — Telephone Encounter (Signed)
Patient declined referral to [facility] despite GFR being 18 with concern for chronic kidney disease. She has also yet to date completed the repeat blood work as requested.

## 2015-01-13 NOTE — Telephone Encounter (Signed)
Per [name] from [facility] said she called pt to schedule and pt said she was not coming. Declined appt

## 2015-01-15 ENCOUNTER — Telehealth: Payer: Self-pay | Admitting: Family

## 2015-01-15 NOTE — Telephone Encounter (Signed)
Patient called in to talk with [facility]. She also needs to know the phone number of where the CT scan for Monday is.  Can you please call her.

## 2015-01-16 NOTE — Telephone Encounter (Signed)
Sent pts lab results in the mail.

## 2015-01-16 NOTE — Telephone Encounter (Signed)
Left message for patient to call office.  

## 2015-01-19 ENCOUNTER — Inpatient Hospital Stay: Admission: RE | Admit: 2015-01-19 | Payer: Self-pay | Source: Ambulatory Visit

## 2015-01-19 ENCOUNTER — Telehealth: Payer: Self-pay | Admitting: *Deleted

## 2015-01-19 ENCOUNTER — Encounter: Payer: Self-pay | Admitting: Family

## 2015-01-19 ENCOUNTER — Other Ambulatory Visit: Payer: Self-pay | Admitting: Family

## 2015-01-19 ENCOUNTER — Ambulatory Visit (INDEPENDENT_AMBULATORY_CARE_PROVIDER_SITE_OTHER)
Admission: RE | Admit: 2015-01-19 | Discharge: 2015-01-19 | Disposition: A | Discharge status: Home / Self Care | Payer: Medicare Other | Source: Ambulatory Visit | Attending: Family | Admitting: Family

## 2015-01-19 DIAGNOSIS — R51 Headache: Secondary | ICD-10-CM

## 2015-01-19 DIAGNOSIS — R519 Headache, unspecified: Secondary | ICD-10-CM

## 2015-01-19 DIAGNOSIS — R42 Dizziness and giddiness: Secondary | ICD-10-CM

## 2015-01-19 DIAGNOSIS — F3160 Bipolar disorder, current episode mixed, unspecified: Secondary | ICD-10-CM

## 2015-01-19 MED ORDER — TOPIRAMATE 100 MG PO TABS: ORAL_TABLET | ORAL | Status: DC

## 2015-01-19 MED ORDER — DIAZEPAM 10 MG PO TABS: 10.0000 mg | ORAL_TABLET | Freq: Three times a day (TID) | ORAL | Status: DC | PRN

## 2015-01-19 NOTE — Telephone Encounter (Signed)
Medications refilled

## 2015-01-19 NOTE — Telephone Encounter (Signed)
Last refill was 9/30

## 2015-01-19 NOTE — Telephone Encounter (Signed)
Notified pt rx's sne tto pharmacy...[name]

## 2015-01-19 NOTE — Telephone Encounter (Signed)
Left msg on triage requesting refills on her Diazepam & Topamax. Pls advise...Johny Chess

## 2015-01-21 ENCOUNTER — Other Ambulatory Visit (INDEPENDENT_AMBULATORY_CARE_PROVIDER_SITE_OTHER): Payer: Medicare Other

## 2015-01-21 ENCOUNTER — Ambulatory Visit (INDEPENDENT_AMBULATORY_CARE_PROVIDER_SITE_OTHER): Payer: Medicare Other | Admitting: Family

## 2015-01-21 ENCOUNTER — Encounter: Payer: Self-pay | Admitting: Family

## 2015-01-21 VITALS — BP 110/86 | HR 92 | Temp 98.0°F | Resp 18 | Ht 61.0 in | Wt 199.0 lb

## 2015-01-21 DIAGNOSIS — Z23 Encounter for immunization: Secondary | ICD-10-CM

## 2015-01-21 DIAGNOSIS — R748 Abnormal levels of other serum enzymes: Secondary | ICD-10-CM | POA: Diagnosis not present

## 2015-01-21 DIAGNOSIS — Z9889 Other specified postprocedural states: Secondary | ICD-10-CM | POA: Diagnosis not present

## 2015-01-21 DIAGNOSIS — R944 Abnormal results of kidney function studies: Secondary | ICD-10-CM

## 2015-01-21 DIAGNOSIS — Z9884 Bariatric surgery status: Secondary | ICD-10-CM | POA: Insufficient documentation

## 2015-01-21 DIAGNOSIS — R7989 Other specified abnormal findings of blood chemistry: Secondary | ICD-10-CM

## 2015-01-21 LAB — RENAL FUNCTION PANEL
Albumin: 4.3 g/dL (ref 3.5–5.2)
BUN: 21 mg/dL (ref 6–23)
CO2: 27 mEq/L (ref 19–32)
Calcium: 9 mg/dL (ref 8.4–10.5)
Chloride: 108 mEq/L (ref 96–112)
Creatinine, Ser: 2.39 mg/dL — ABNORMAL HIGH (ref 0.40–1.20)
GFR: 27.75 mL/min — ABNORMAL LOW (ref >60.00)
Glucose, Bld: 91 mg/dL (ref 70–99)
Phosphorus: 2.9 mg/dL (ref 2.3–4.6)
Potassium: 3.4 mEq/L — ABNORMAL LOW (ref 3.5–5.1)
Sodium: 143 mEq/L (ref 135–145)

## 2015-01-21 NOTE — Progress Notes (Signed)
Pre visit review using our clinic review tool, if applicable. No additional management support is needed unless otherwise documented below in the visit note.   Getting flu shot

## 2015-01-21 NOTE — Progress Notes (Signed)
Subjective:    Patient ID: [name], female    DOB: [birth year], 49 y.o.   MRN: [id number]  Chief Complaint  Patient presents with  . Referral    wants referral to different nephrologist, and plastic surgery    HPI:  [name] is a 49 y.o. female who  has a past medical history of IUD; Hypertension; Anemia; History of blood transfusion; Anxiety; Bipolar disorder ([name]); Depression; and Seizures ([name]) [id number]). and presents today for an office follow up.    1.) Decreased kidney function - During recent lab work she was noted to have an elevated serum creatinine and decreased GFR. Was requested at that time follow up with additional blood work which was never completed and the referral to nephrologist was declined by the patient at that time. Indicates she does not want to go to [facility] and would like to be referred somewhere else. Denies any changes to urination.   2.) Refer to plastic surgery - Would like to be referred for plastic surgery for removal of abdominal tissue following bariatric surgery. Reports the more weight she losses, the more difficulty that she has.   Allergies  Allergen Reactions  . Ondansetron Nausea And Vomiting  . Sulfa Antibiotics Rash     Current Outpatient Prescriptions on File Prior to Visit  Medication Sig Dispense Refill  . AMITIZA 24 MCG capsule Take 24 mcg by mouth 2 (two) times daily.     Marland Kitchen amLODipine (NORVASC) 10 MG tablet Take 10 mg by mouth daily.    Marland Kitchen azithromycin (ZITHROMAX) 250 MG tablet Take 2 tablets for 1 day and then 1 tablet daily for 4 days 6 tablet 0  . carvedilol (COREG) 12.5 MG tablet Take 1 tablet (12.5 mg total) by mouth 2 (two) times daily with a meal. 60 tablet 5  . celecoxib (CELEBREX) 200 MG capsule Take 200 mg by mouth daily.    . diazepam (VALIUM) 10 MG tablet Take 1 tablet (10 mg total) by mouth 3 (three) times daily as needed for anxiety. 90 tablet 0  . esomeprazole (NEXIUM) 40 MG capsule Take 40 mg  by mouth daily at 12 noon.    . Fe Fum-FePoly-FA-Vit C-Vit B3 (INTEGRA F) 125-1 MG CAPS Take 1 tablet by mouth daily.    . furosemide (LASIX) 20 MG tablet Take 20 mg by mouth daily.    Marland Kitchen HYDROcodone-homatropine (HYCODAN) 5-1.5 MG/5ML syrup Take 5 mLs by mouth every 8 (eight) hours as needed for cough. 120 mL 0  . lidocaine (LIDODERM) 5 % Place 1 patch onto the skin daily as needed (for pain). Remove & Discard patch within 12 hours or as directed by MD    . losartan (COZAAR) 25 MG tablet TAKE ONE TABLET BY MOUTH ONCE DAILY. 90 tablet 0  . promethazine (PHENERGAN) 25 MG tablet Take 25 mg by mouth every 6 (six) hours as needed for nausea or vomiting.    Marland Kitchen tiZANidine (ZANAFLEX) 4 MG tablet Take 4 mg by mouth every 8 (eight) hours as needed for muscle spasms.    Marland Kitchen topiramate (TOPAMAX) 100 MG tablet TAKE (2) TABLETS BY MOUTH AT BEDTIME. 60 tablet 2  . traMADol (ULTRAM-ER) 200 MG 24 hr tablet Take 200 mg by mouth daily.    Marland Kitchen venlafaxine XR (EFFEXOR-XR) 150 MG 24 hr capsule TAKE ONE CAPSULE BY MOUTH TWICE DAILY. 60 capsule 5   No current facility-administered medications on file prior to visit.     Past Surgical History  Procedure Laterality Date  . Rotator cuff repair    . Gastric bypass  2000  . Hysteroscopy w/d&c  11/14/2011    Procedure: DILATATION AND CURETTAGE /HYSTEROSCOPY;  Surgeon: Marylynn Pearson, MD;  Location: [facility];  Service: Gynecology;;  with removal of polyps  . Intrauterine device (iud) insertion  03/21/2010  . Carpal tunnel release Right 07/08/2014    Procedure: RIGHT CARPAL TUNNEL RELEASE;  Surgeon: Sanjuana Kava, MD;  Location: [facility];  Service: Orthopedics;  Laterality: Right;  . Cholecystectomy      Review of Systems  Constitutional: Negative for fever and chills.  Genitourinary: Negative for dysuria, urgency, frequency, hematuria, decreased urine volume and difficulty urinating.      Objective:    BP 110/86 mmHg  Pulse 92  Temp(Src) 98 F (36.7 C) (Oral)  Resp 18   Ht 5\' 1"  (1.549 m)  Wt 199 lb (90.266 kg)  BMI 37.62 kg/m2  SpO2 99% Nursing note and vital signs reviewed.  Physical Exam  Constitutional: She is oriented to person, place, and time. She appears well-developed and well-nourished. No distress.  Cardiovascular: Normal rate, regular rhythm, normal heart sounds and intact distal pulses.   Pulmonary/Chest: Effort normal and breath sounds normal.  Neurological: She is alert and oriented to person, place, and time.  Skin: Skin is warm and dry.  Psychiatric: She has a normal mood and affect. Her behavior is normal. Judgment and thought content normal.       Assessment & Plan:   Problem List Items Addressed This Visit      Other   Decreased GFR - Primary    Previously noted to have a GFR of 18 with requested retesting not being completed. No signs of urinary dysfunction. Obtain renal panel which was previously ordered. New referral to nephrology placed per patient request. Concern for CKD Stage IV. Continue medications as prescribed and follow up pending nephrology appointment.       Relevant Orders   Ambulatory referral to Nephrology   H/O gastric bypass    Requesting referral to plastic surgery for follow up after gastric bypass surgery with additional weight loss causing mild discomfort near the right abdomen.       Relevant Orders   Ambulatory referral to Plastic Surgery    Other Visit Diagnoses    Encounter for immunization

## 2015-01-21 NOTE — Assessment & Plan Note (Signed)
Previously noted to have a GFR of 18 with requested retesting not being completed. No signs of urinary dysfunction. Obtain renal panel which was previously ordered. New referral to nephrology placed per patient request. Concern for CKD Stage IV. Continue medications as prescribed and follow up pending nephrology appointment.

## 2015-01-21 NOTE — Patient Instructions (Signed)
Thank you for choosing [facility].  Summary/Instructions:  Please stop by the lab on the basement level of the building for your blood work. Your results will be released to MyChart (or called to you) after review, usually within 72 hours after test completion. If any changes need to be made, you will be notified at that same time.  If your symptoms worsen or fail to improve, please contact our office for further instruction, or in case of emergency go directly to the emergency room at the closest medical facility.     

## 2015-01-21 NOTE — Assessment & Plan Note (Signed)
Requesting referral to plastic surgery for follow up after gastric bypass surgery with additional weight loss causing mild discomfort near the right abdomen.

## 2015-02-05 ENCOUNTER — Telehealth: Payer: Self-pay | Admitting: Family

## 2015-02-05 NOTE — Telephone Encounter (Signed)
Bonnita Nasuti completed the referrals.

## 2015-02-05 NOTE — Telephone Encounter (Signed)
Patient called in asking about the 2 referrals that were put in on 11/2

## 2015-02-16 ENCOUNTER — Ambulatory Visit: Payer: Self-pay | Admitting: Neurology

## 2015-03-04 DIAGNOSIS — M549 Dorsalgia, unspecified: Secondary | ICD-10-CM | POA: Diagnosis not present

## 2015-03-04 DIAGNOSIS — M25561 Pain in right knee: Secondary | ICD-10-CM | POA: Diagnosis not present

## 2015-03-04 DIAGNOSIS — L304 Erythema intertrigo: Secondary | ICD-10-CM | POA: Diagnosis not present

## 2015-03-04 DIAGNOSIS — M793 Panniculitis, unspecified: Secondary | ICD-10-CM | POA: Diagnosis not present

## 2015-03-04 DIAGNOSIS — M25562 Pain in left knee: Secondary | ICD-10-CM | POA: Diagnosis not present

## 2015-03-04 DIAGNOSIS — Z9884 Bariatric surgery status: Secondary | ICD-10-CM | POA: Diagnosis not present

## 2015-03-07 ENCOUNTER — Emergency Department (HOSPITAL_COMMUNITY): Payer: Medicare Other

## 2015-03-07 ENCOUNTER — Inpatient Hospital Stay (HOSPITAL_COMMUNITY)
Admission: EM | Admit: 2015-03-07 | Discharge: 2015-03-09 | DRG: 389 | Disposition: A | Discharge status: Home / Self Care | Payer: Medicare Other | Attending: Internal Medicine | Admitting: Internal Medicine

## 2015-03-07 ENCOUNTER — Encounter (HOSPITAL_COMMUNITY): Payer: Self-pay

## 2015-03-07 DIAGNOSIS — D638 Anemia in other chronic diseases classified elsewhere: Secondary | ICD-10-CM | POA: Diagnosis present

## 2015-03-07 DIAGNOSIS — Z791 Long term (current) use of non-steroidal anti-inflammatories (NSAID): Secondary | ICD-10-CM

## 2015-03-07 DIAGNOSIS — F419 Anxiety disorder, unspecified: Secondary | ICD-10-CM | POA: Diagnosis present

## 2015-03-07 DIAGNOSIS — R1013 Epigastric pain: Secondary | ICD-10-CM

## 2015-03-07 DIAGNOSIS — Z87891 Personal history of nicotine dependence: Secondary | ICD-10-CM | POA: Diagnosis not present

## 2015-03-07 DIAGNOSIS — Z6838 Body mass index (BMI) 38.0-38.9, adult: Secondary | ICD-10-CM

## 2015-03-07 DIAGNOSIS — I129 Hypertensive chronic kidney disease with stage 1 through stage 4 chronic kidney disease, or unspecified chronic kidney disease: Secondary | ICD-10-CM | POA: Diagnosis not present

## 2015-03-07 DIAGNOSIS — E876 Hypokalemia: Secondary | ICD-10-CM | POA: Diagnosis not present

## 2015-03-07 DIAGNOSIS — Z9889 Other specified postprocedural states: Secondary | ICD-10-CM | POA: Diagnosis not present

## 2015-03-07 DIAGNOSIS — Z79891 Long term (current) use of opiate analgesic: Secondary | ICD-10-CM

## 2015-03-07 DIAGNOSIS — I10 Essential (primary) hypertension: Secondary | ICD-10-CM | POA: Diagnosis present

## 2015-03-07 DIAGNOSIS — N184 Chronic kidney disease, stage 4 (severe): Secondary | ICD-10-CM | POA: Diagnosis not present

## 2015-03-07 DIAGNOSIS — F319 Bipolar disorder, unspecified: Secondary | ICD-10-CM | POA: Diagnosis not present

## 2015-03-07 DIAGNOSIS — K5660 Unspecified intestinal obstruction: Principal | ICD-10-CM | POA: Diagnosis present

## 2015-03-07 DIAGNOSIS — K56609 Unspecified intestinal obstruction, unspecified as to partial versus complete obstruction: Secondary | ICD-10-CM | POA: Diagnosis present

## 2015-03-07 DIAGNOSIS — K529 Noninfective gastroenteritis and colitis, unspecified: Secondary | ICD-10-CM | POA: Diagnosis present

## 2015-03-07 DIAGNOSIS — Z9884 Bariatric surgery status: Secondary | ICD-10-CM

## 2015-03-07 DIAGNOSIS — Z79899 Other long term (current) drug therapy: Secondary | ICD-10-CM

## 2015-03-07 DIAGNOSIS — R569 Unspecified convulsions: Secondary | ICD-10-CM | POA: Diagnosis not present

## 2015-03-07 DIAGNOSIS — R109 Unspecified abdominal pain: Secondary | ICD-10-CM | POA: Diagnosis present

## 2015-03-07 DIAGNOSIS — K5669 Other intestinal obstruction: Secondary | ICD-10-CM | POA: Diagnosis not present

## 2015-03-07 DIAGNOSIS — R1084 Generalized abdominal pain: Secondary | ICD-10-CM | POA: Diagnosis not present

## 2015-03-07 DIAGNOSIS — F3160 Bipolar disorder, current episode mixed, unspecified: Secondary | ICD-10-CM | POA: Diagnosis not present

## 2015-03-07 LAB — URINALYSIS, ROUTINE W REFLEX MICROSCOPIC
Bilirubin Urine: NEGATIVE
Glucose, UA: NEGATIVE mg/dL
Hgb urine dipstick: NEGATIVE
Ketones, ur: NEGATIVE mg/dL
Leukocytes, UA: NEGATIVE
Nitrite: NEGATIVE
Protein, ur: 30 mg/dL — AB
Specific Gravity, Urine: 1.018 (ref 1.005–1.030)
pH: 5 (ref 5.0–8.0)

## 2015-03-07 LAB — COMPREHENSIVE METABOLIC PANEL
ALT: 18 U/L (ref 14–54)
AST: 20 U/L (ref 15–41)
Albumin: 4.7 g/dL (ref 3.5–5.0)
Alkaline Phosphatase: 193 U/L — ABNORMAL HIGH (ref 38–126)
Anion gap: 12 (ref 5–15)
BUN: 37 mg/dL — ABNORMAL HIGH (ref 6–20)
CO2: 23 mmol/L (ref 22–32)
Calcium: 8.5 mg/dL — ABNORMAL LOW (ref 8.9–10.3)
Chloride: 106 mmol/L (ref 101–111)
Creatinine, Ser: 2.43 mg/dL — ABNORMAL HIGH (ref 0.44–1.00)
GFR calc Af Amer: 26 mL/min — ABNORMAL LOW (ref >60)
GFR calc non Af Amer: 22 mL/min — ABNORMAL LOW (ref >60)
Glucose, Bld: 146 mg/dL — ABNORMAL HIGH (ref 65–99)
Potassium: 3.7 mmol/L (ref 3.5–5.1)
Sodium: 141 mmol/L (ref 135–145)
Total Bilirubin: 0.8 mg/dL (ref 0.3–1.2)
Total Protein: 7.6 g/dL (ref 6.5–8.1)

## 2015-03-07 LAB — CBC WITH DIFFERENTIAL/PLATELET
Basophils Absolute: 0 10*3/uL (ref 0.0–0.1)
Basophils Relative: 0 %
Eosinophils Absolute: 0 10*3/uL (ref 0.0–0.7)
Eosinophils Relative: 0 %
HCT: 42.7 % (ref 36.0–46.0)
Hemoglobin: 13.8 g/dL (ref 12.0–15.0)
Lymphocytes Relative: 17 %
Lymphs Abs: 0.9 10*3/uL (ref 0.7–4.0)
MCH: 28.8 pg (ref 26.0–34.0)
MCHC: 32.3 g/dL (ref 30.0–36.0)
MCV: 89 fL (ref 78.0–100.0)
Monocytes Absolute: 0.1 10*3/uL (ref 0.1–1.0)
Monocytes Relative: 3 %
Neutro Abs: 4.4 10*3/uL (ref 1.7–7.7)
Neutrophils Relative %: 80 %
Platelets: 143 10*3/uL — ABNORMAL LOW (ref 150–400)
RBC: 4.8 MIL/uL (ref 3.87–5.11)
RDW: 14.7 % (ref 11.5–15.5)
WBC: 5.5 10*3/uL (ref 4.0–10.5)

## 2015-03-07 LAB — LIPASE, BLOOD: Lipase: 41 U/L (ref 11–51)

## 2015-03-07 LAB — URINE MICROSCOPIC-ADD ON
Bacteria, UA: NONE SEEN
RBC / HPF: NONE SEEN RBC/hpf (ref 0–5)
WBC, UA: NONE SEEN WBC/hpf (ref 0–5)

## 2015-03-07 MED ORDER — AMLODIPINE BESYLATE 10 MG PO TABS
10.0000 mg | ORAL_TABLET | Freq: Every day | ORAL | Status: DC
  Administered 2015-03-07 – 2015-03-08 (×2): 10 mg via ORAL
  Filled 2015-03-07 (×4): qty 1

## 2015-03-07 MED ORDER — GUAIFENESIN-DM 100-10 MG/5ML PO SYRP: 5.0000 mL | ORAL_SOLUTION | ORAL | Status: DC | PRN

## 2015-03-07 MED ORDER — TIZANIDINE HCL 4 MG PO TABS
4.0000 mg | ORAL_TABLET | Freq: Three times a day (TID) | ORAL | Status: DC | PRN
  Filled 2015-03-07: qty 1

## 2015-03-07 MED ORDER — VENLAFAXINE HCL ER 150 MG PO CP24
150.0000 mg | ORAL_CAPSULE | Freq: Two times a day (BID) | ORAL | Status: DC
  Administered 2015-03-07 – 2015-03-09 (×4): 150 mg via ORAL
  Filled 2015-03-07 (×5): qty 1

## 2015-03-07 MED ORDER — MORPHINE SULFATE (PF) 2 MG/ML IV SOLN
2.0000 mg | INTRAVENOUS | Status: DC | PRN
  Administered 2015-03-07: 2 mg via INTRAVENOUS
  Filled 2015-03-07: qty 1

## 2015-03-07 MED ORDER — SODIUM CHLORIDE 0.9 % IV SOLN
INTRAVENOUS | Status: AC
  Administered 2015-03-07: 14:00:00 via INTRAVENOUS

## 2015-03-07 MED ORDER — PROMETHAZINE HCL 25 MG/ML IJ SOLN: 25.0000 mg | Freq: Four times a day (QID) | INTRAMUSCULAR | Status: DC | PRN

## 2015-03-07 MED ORDER — TOPIRAMATE 100 MG PO TABS
200.0000 mg | ORAL_TABLET | Freq: Every day | ORAL | Status: DC
  Administered 2015-03-07 – 2015-03-08 (×2): 200 mg via ORAL
  Filled 2015-03-07 (×4): qty 2

## 2015-03-07 MED ORDER — TOPIRAMATE 100 MG PO TABS
100.0000 mg | ORAL_TABLET | Freq: Every day | ORAL | Status: DC
  Filled 2015-03-07: qty 1

## 2015-03-07 MED ORDER — MORPHINE SULFATE (PF) 4 MG/ML IV SOLN
4.0000 mg | Freq: Once | INTRAVENOUS | Status: AC
Start: 2015-03-07 — End: 2015-03-07
  Administered 2015-03-07: 4 mg via INTRAVENOUS
  Filled 2015-03-07: qty 1

## 2015-03-07 MED ORDER — HYDROCODONE-ACETAMINOPHEN 5-325 MG PO TABS: 1.0000 | ORAL_TABLET | ORAL | Status: DC | PRN

## 2015-03-07 MED ORDER — SODIUM CHLORIDE 0.9 % IV BOLUS (SEPSIS)
1000.0000 mL | Freq: Once | INTRAVENOUS | Status: AC
  Administered 2015-03-07: 1000 mL via INTRAVENOUS

## 2015-03-07 MED ORDER — IOHEXOL 300 MG/ML  SOLN
50.0000 mL | Freq: Once | INTRAMUSCULAR | Status: AC | PRN
  Administered 2015-03-07: 50 mL via ORAL

## 2015-03-07 MED ORDER — PANTOPRAZOLE SODIUM 40 MG IV SOLR
40.0000 mg | Freq: Two times a day (BID) | INTRAVENOUS | Status: DC
  Administered 2015-03-07 – 2015-03-08 (×3): 40 mg via INTRAVENOUS
  Filled 2015-03-07 (×6): qty 40

## 2015-03-07 MED ORDER — DIAZEPAM 5 MG PO TABS
10.0000 mg | ORAL_TABLET | Freq: Three times a day (TID) | ORAL | Status: DC | PRN
  Administered 2015-03-07 – 2015-03-08 (×3): 10 mg via ORAL
  Filled 2015-03-07 (×3): qty 2

## 2015-03-07 MED ORDER — HEPARIN SODIUM (PORCINE) 5000 UNIT/ML IJ SOLN
5000.0000 [IU] | Freq: Three times a day (TID) | INTRAMUSCULAR | Status: DC
  Administered 2015-03-07 – 2015-03-09 (×5): 5000 [IU] via SUBCUTANEOUS
  Filled 2015-03-07 (×8): qty 1

## 2015-03-07 MED ORDER — CARVEDILOL 12.5 MG PO TABS
12.5000 mg | ORAL_TABLET | Freq: Two times a day (BID) | ORAL | Status: DC
  Administered 2015-03-07 – 2015-03-09 (×4): 12.5 mg via ORAL
  Filled 2015-03-07 (×6): qty 1

## 2015-03-07 MED ORDER — PROMETHAZINE HCL 25 MG/ML IJ SOLN
12.5000 mg | Freq: Once | INTRAMUSCULAR | Status: AC
  Administered 2015-03-07: 12.5 mg via INTRAVENOUS
  Filled 2015-03-07: qty 1

## 2015-03-07 MED ORDER — SODIUM CHLORIDE 0.9 % IV SOLN
INTRAVENOUS | Status: DC
  Administered 2015-03-07 – 2015-03-08 (×2): via INTRAVENOUS

## 2015-03-07 NOTE — Consult Note (Signed)
Reason for Consult:vomiting Referring Physician: Dr. Clinton Quant is an 49 y.o. female.  HPI: The pt is a 49yo bf who presents with abdominal pain that started yesterday. She had some nausea and vomiting during the night. No fever or chills. She thinks she has been having more abdominal discomfort over the last couple months. She has a history of gastric bypass done in [place], [place] about 10 years ago by a Dr. Sherryle Lis.  Past Medical History  Diagnosis Date  . IUD     HISTORY OF IUD --REMOVED IN 2006  . Hypertension   . Anemia   . History of blood transfusion   . Anxiety   . Bipolar disorder ([place])   . Depression   . Seizures ([place]) [id number]    two seizures due to a med changes    Past Surgical History  Procedure Laterality Date  . Rotator cuff repair    . Gastric bypass  2000  . Hysteroscopy w/d&c  11/14/2011    Procedure: DILATATION AND CURETTAGE /HYSTEROSCOPY;  Surgeon: Marylynn Pearson, MD;  Location: [facility];  Service: Gynecology;;  with removal of polyps  . Intrauterine device (iud) insertion  03/21/2010  . Carpal tunnel release Right 07/08/2014    Procedure: RIGHT CARPAL TUNNEL RELEASE;  Surgeon: Sanjuana Kava, MD;  Location: [facility];  Service: Orthopedics;  Laterality: Right;  . Cholecystectomy      Family History  Problem Relation Age of Onset  . Hypertension Mother   . Kidney failure Mother   . Hypertension Father   . Diabetes Sister   . Hypertension Sister   . Hypertension Brother   . Hypertension Maternal Aunt   . Hypertension Maternal Uncle   . Hypertension Maternal Grandmother   . Hypertension Maternal Grandfather   . Hypertension Paternal Grandmother   . Hypertension Paternal Grandfather   . Diabetes Cousin     Social History:  reports that she quit smoking about 26 years ago. Her smoking use included Cigarettes. She has a 1 pack-year smoking history. She has never used smokeless tobacco. She reports that she drinks alcohol. She reports that she  does not use illicit drugs.  Allergies:  Allergies  Allergen Reactions  . Ondansetron Nausea And Vomiting  . Quetiapine     Other reaction(s): Other (See Comments) Possible Hair Loss  . Sulfa Antibiotics Rash    Medications: I have reviewed the patient's current medications.  Results for orders placed or performed during the hospital encounter of 03/07/15 (from the past 48 hour(s))  Comprehensive metabolic panel     Status: Abnormal   Collection Time: 03/07/15  9:13 AM  Result Value Ref Range   Sodium 141 135 - 145 mmol/L   Potassium 3.7 3.5 - 5.1 mmol/L   Chloride 106 101 - 111 mmol/L   CO2 23 22 - 32 mmol/L   Glucose, Bld 146 (H) 65 - 99 mg/dL   BUN 37 (H) 6 - 20 mg/dL   Creatinine, Ser 2.43 (H) 0.44 - 1.00 mg/dL   Calcium 8.5 (L) 8.9 - 10.3 mg/dL   Total Protein 7.6 6.5 - 8.1 g/dL   Albumin 4.7 3.5 - 5.0 g/dL   AST 20 15 - 41 U/L   ALT 18 14 - 54 U/L   Alkaline Phosphatase 193 (H) 38 - 126 U/L   Total Bilirubin 0.8 0.3 - 1.2 mg/dL   GFR calc non Af Amer 22 (L) >60 mL/min   GFR calc Af Amer 26 (L) >60 mL/min  Comment: (NOTE) The eGFR has been calculated using the CKD EPI equation. This calculation has not been validated in all clinical situations. eGFR's persistently <60 mL/min signify possible Chronic Kidney Disease.    Anion gap 12 5 - 15  Lipase, blood     Status: None   Collection Time: 03/07/15  9:13 AM  Result Value Ref Range   Lipase 41 11 - 51 U/L  CBC with Differential     Status: Abnormal   Collection Time: 03/07/15  9:13 AM  Result Value Ref Range   WBC 5.5 4.0 - 10.5 K/uL   RBC 4.80 3.87 - 5.11 MIL/uL   Hemoglobin 13.8 12.0 - 15.0 g/dL   HCT 42.7 36.0 - 46.0 %   MCV 89.0 78.0 - 100.0 fL   MCH 28.8 26.0 - 34.0 pg   MCHC 32.3 30.0 - 36.0 g/dL   RDW 14.7 11.5 - 15.5 %   Platelets 143 (L) 150 - 400 K/uL   Neutrophils Relative % 80 %   Neutro Abs 4.4 1.7 - 7.7 K/uL   Lymphocytes Relative 17 %   Lymphs Abs 0.9 0.7 - 4.0 K/uL   Monocytes Relative  3 %   Monocytes Absolute 0.1 0.1 - 1.0 K/uL   Eosinophils Relative 0 %   Eosinophils Absolute 0.0 0.0 - 0.7 K/uL   Basophils Relative 0 %   Basophils Absolute 0.0 0.0 - 0.1 K/uL  Urinalysis, Routine w reflex microscopic     Status: Abnormal   Collection Time: 03/07/15 11:49 AM  Result Value Ref Range   Color, Urine YELLOW YELLOW   APPearance CLEAR CLEAR   Specific Gravity, Urine 1.018 1.005 - 1.030   pH 5.0 5.0 - 8.0   Glucose, UA NEGATIVE NEGATIVE mg/dL   Hgb urine dipstick NEGATIVE NEGATIVE   Bilirubin Urine NEGATIVE NEGATIVE   Ketones, ur NEGATIVE NEGATIVE mg/dL   Protein, ur 30 (A) NEGATIVE mg/dL   Nitrite NEGATIVE NEGATIVE   Leukocytes, UA NEGATIVE NEGATIVE  Urine microscopic-add on     Status: Abnormal   Collection Time: 03/07/15 11:49 AM  Result Value Ref Range   Squamous Epithelial / LPF 0-5 (A) NONE SEEN   WBC, UA NONE SEEN 0 - 5 WBC/hpf   RBC / HPF NONE SEEN 0 - 5 RBC/hpf   Bacteria, UA NONE SEEN NONE SEEN    Ct Abdomen Pelvis Wo Contrast  03/07/2015  CLINICAL DATA:  Epigastric pain EXAM: CT ABDOMEN AND PELVIS WITHOUT CONTRAST TECHNIQUE: Multidetector CT imaging of the abdomen and pelvis was performed following the standard protocol without IV contrast. COMPARISON:  03/25/2014 FINDINGS: Lung bases are free of acute infiltrate or sizable effusion. The gallbladder has been surgically removed. The liver, spleen, adrenal glands and pancreas are within normal limits. A hiatal hernia is noted. Postsurgical changes are noted in the stomach. The kidneys are well visualized bilaterally. No calculi or obstructive changes are noted. The appendix is within normal limits. The colon is within normal limits. There is a focally dilated loop of small bowel identified in the left mid abdomen in an area of prior surgery. No definitive obstructing lesion is seen although there is considerable fecalization of bowel contents. This may represent an area of postsurgical stenosis. An IUD is noted  within the uterus. The bladder is well distended. No acute bony abnormality is seen. IMPRESSION: Focally dilated loop of small bowel as described in the left mid abdomen. This may represent an area of postsurgical stenosis with partial small bowel obstruction. No  other focal abnormality is seen. Electronically Signed   By: Inez Catalina M.D.   On: 03/07/2015 10:42    Review of Systems  Constitutional: Negative.   HENT: Negative.   Eyes: Negative.   Respiratory: Negative.   Cardiovascular: Negative.   Gastrointestinal: Positive for nausea, vomiting and abdominal pain.  Genitourinary: Negative.   Musculoskeletal: Negative.   Skin: Negative.   Neurological: Negative.   Endo/Heme/Allergies: Negative.   Psychiatric/Behavioral: Negative.    Blood pressure 129/92, pulse 98, temperature 98.2 F (36.8 C), temperature source Oral, resp. rate 16, height 4' 11"  (1.499 m), weight 86.183 kg (190 lb), SpO2 99 %. Physical Exam  Constitutional: She is oriented to person, place, and time. She appears well-developed and well-nourished.  HENT:  Head: Normocephalic and atraumatic.  Eyes: Conjunctivae and EOM are normal. Pupils are equal, round, and reactive to light.  Neck: Normal range of motion. Neck supple.  Cardiovascular: Normal rate, regular rhythm and normal heart sounds.   Respiratory: Effort normal and breath sounds normal.  GI: Soft. Bowel sounds are normal.  There is some mild tenderness centrally but no distension.  Musculoskeletal: Normal range of motion.  Neurological: She is alert and oriented to person, place, and time.  Skin: Skin is warm and dry.  Psychiatric: She has a normal mood and affect. Her behavior is normal.    Assessment/Plan: The pt appears to have a partial sbo possibly near one of her anastamoses. I would agree with bowel rest and IV hydration. If she vomits again then she may need an ng tube and would start small bowel protocol. Will follow closely.  TOTH III,PAUL  S 03/07/2015, 4:20 PM

## 2015-03-07 NOTE — ED Provider Notes (Signed)
CSN: [id number]     Arrival date & time 03/07/15  [id number] History   First MD Initiated Contact with Patient 03/07/15 [phone]     Chief Complaint  Patient presents with  . Abdominal Pain     (Consider location/radiation/quality/duration/timing/severity/associated sxs/prior Treatment) HPI Comments: Patient is a 49 year old female with history of gastric bypass, hypertension, bipolar. She presents for evaluation of periumbilical and epigastric abdominal pain which started yesterday and has worsened through the night. She reports vomiting on several occasions. She has had several bowel movements, however denies diarrhea. She denies fevers or chills. She denies any urinary complaints. She had similar symptoms in the past which she reports were related to ulcers.  Patient is a 49 y.o. female presenting with abdominal pain. The history is provided by the patient.  Abdominal Pain Pain location:  Epigastric Pain quality: cramping   Pain radiates to:  Does not radiate Pain severity:  Moderate Onset quality:  Gradual Duration:  24 hours Timing:  Constant Progression:  Worsening Chronicity:  New Relieved by:  Nothing Worsened by:  Nothing tried Ineffective treatments:  None tried Associated symptoms: nausea and vomiting     Past Medical History  Diagnosis Date  . IUD     HISTORY OF IUD --REMOVED IN 2006  . Hypertension   . Anemia   . History of blood transfusion   . Anxiety   . Bipolar disorder ([place])   . Depression   . Seizures ([place]) [id number]    two seizures due to a med changes   Past Surgical History  Procedure Laterality Date  . Rotator cuff repair    . Gastric bypass  2000  . Hysteroscopy w/d&c  11/14/2011    Procedure: DILATATION AND CURETTAGE /HYSTEROSCOPY;  Surgeon: Marylynn Pearson, MD;  Location: [facility];  Service: Gynecology;;  with removal of polyps  . Intrauterine device (iud) insertion  03/21/2010  . Carpal tunnel release Right 07/08/2014    Procedure: RIGHT CARPAL TUNNEL  RELEASE;  Surgeon: Sanjuana Kava, MD;  Location: [facility];  Service: Orthopedics;  Laterality: Right;  . Cholecystectomy     Family History  Problem Relation Age of Onset  . Hypertension Mother   . Kidney failure Mother   . Hypertension Father   . Diabetes Sister   . Hypertension Sister   . Hypertension Brother   . Hypertension Maternal Aunt   . Hypertension Maternal Uncle   . Hypertension Maternal Grandmother   . Hypertension Maternal Grandfather   . Hypertension Paternal Grandmother   . Hypertension Paternal Grandfather   . Diabetes Cousin    Social History  Substance Use Topics  . Smoking status: Former Smoker -- 0.25 packs/day for 4 years    Types: Cigarettes    Quit date: 03/21/1988  . Smokeless tobacco: Never Used  . Alcohol Use: Yes     Comment: social   OB History    Gravida Para Term Preterm AB TAB SAB Ectopic Multiple Living   1    1  1    0     Review of Systems  Gastrointestinal: Positive for nausea, vomiting and abdominal pain.  All other systems reviewed and are negative.     Allergies  Ondansetron and Sulfa antibiotics  Home Medications   Prior to Admission medications   Medication Sig Start Date End Date Taking? Authorizing Provider  AMITIZA 24 MCG capsule Take 24 mcg by mouth 2 (two) times daily.  01/01/14   Historical Provider, MD  amLODipine (NORVASC) 10 MG tablet Take  10 mg by mouth daily.    Historical Provider, MD  azithromycin (ZITHROMAX) 250 MG tablet Take 2 tablets for 1 day and then 1 tablet daily for 4 days 11/18/14   [name], FNP  carvedilol (COREG) 12.5 MG tablet Take 1 tablet (12.5 mg total) by mouth 2 (two) times daily with a meal. 10/23/14   [name], FNP  celecoxib (CELEBREX) 200 MG capsule Take 200 mg by mouth daily.    Historical Provider, MD  diazepam (VALIUM) 10 MG tablet Take 1 tablet (10 mg total) by mouth 3 (three) times daily as needed for anxiety. 01/19/15   [name], FNP  esomeprazole (NEXIUM) 40 MG  capsule Take 40 mg by mouth daily at 12 noon.    Historical Provider, MD  Fe Fum-FePoly-FA-Vit C-Vit B3 (INTEGRA F) 125-1 MG CAPS Take 1 tablet by mouth daily.    Historical Provider, MD  furosemide (LASIX) 20 MG tablet Take 20 mg by mouth daily.    Historical Provider, MD  HYDROcodone-homatropine (HYCODAN) 5-1.5 MG/5ML syrup Take 5 mLs by mouth every 8 (eight) hours as needed for cough. 11/18/14   [name], FNP  lidocaine (LIDODERM) 5 % Place 1 patch onto the skin daily as needed (for pain). Remove & Discard patch within 12 hours or as directed by MD    Historical Provider, MD  losartan (COZAAR) 25 MG tablet TAKE ONE TABLET BY MOUTH ONCE DAILY. 12/19/14   [name], FNP  promethazine (PHENERGAN) 25 MG tablet Take 25 mg by mouth every 6 (six) hours as needed for nausea or vomiting.    Historical Provider, MD  tiZANidine (ZANAFLEX) 4 MG tablet Take 4 mg by mouth every 8 (eight) hours as needed for muscle spasms.    Historical Provider, MD  topiramate (TOPAMAX) 100 MG tablet TAKE (2) TABLETS BY MOUTH AT BEDTIME. 01/19/15   [name], FNP  traMADol (ULTRAM-ER) 200 MG 24 hr tablet Take 200 mg by mouth daily.    Historical Provider, MD  venlafaxine XR (EFFEXOR-XR) 150 MG 24 hr capsule TAKE ONE CAPSULE BY MOUTH TWICE DAILY. 12/01/14   [name], FNP   BP 164/124 mmHg  Pulse 106  Temp(Src) 98 F (36.7 C) (Oral)  Resp 17  Ht 4\' 11"  (1.499 m)  Wt 190 lb (86.183 kg)  BMI 38.35 kg/m2  SpO2 99% Physical Exam  Constitutional: She is oriented to person, place, and time. She appears well-developed and well-nourished. No distress.  HENT:  Head: Normocephalic and atraumatic.  Neck: Normal range of motion. Neck supple.  Cardiovascular: Normal rate and regular rhythm.  Exam reveals no gallop and no friction rub.   No murmur heard. Pulmonary/Chest: Effort normal and breath sounds normal. No respiratory distress. She has no wheezes.  Abdominal: Soft. Bowel sounds are normal. She  exhibits no distension. There is tenderness.  There is tenderness to palpation in the epigastric and periumbilical region. There is no rebound and no guarding.  Musculoskeletal: Normal range of motion.  Neurological: She is alert and oriented to person, place, and time.  Skin: Skin is warm and dry. She is not diaphoretic.  Nursing note and vitals reviewed.   ED Course  Procedures (including critical care time) Labs Review Labs Reviewed  COMPREHENSIVE METABOLIC PANEL  LIPASE, BLOOD  CBC WITH DIFFERENTIAL/PLATELET  URINALYSIS, ROUTINE W REFLEX MICROSCOPIC (NOT AT [facility])    Imaging Review No results found. I have personally reviewed and evaluated these images and lab results as  part of my medical decision-making.   EKG Interpretation None      MDM   Final diagnoses:  None    Patient presents with abdominal cramping and vomiting. Her workup is consistent with a area of partial small bowel obstruction. I've spoken with Dr. Marlou Starks from surgery who would like the patient admitted to the medicine service. Dr. Candiss Norse agrees to admit.    Veryl Speak, MD 03/07/15 [phone]

## 2015-03-07 NOTE — H&P (Signed)
Patient Demographics:    [name], is a 49 y.o. female  MRN: [id number]   DOB - [birth year]  Admit Date - 03/07/2015  Outpatient Primary MD for the patient is Mauricio Po, FNP   With History of -  Past Medical History  Diagnosis Date  . IUD     HISTORY OF IUD --REMOVED IN 2006  . Hypertension   . Anemia   . History of blood transfusion   . Anxiety   . Bipolar disorder ([place])   . Depression   . Seizures ([place]) [id number]    two seizures due to a med changes      Past Surgical History  Procedure Laterality Date  . Rotator cuff repair    . Gastric bypass  2000  . Hysteroscopy w/d&c  11/14/2011    Procedure: DILATATION AND CURETTAGE /HYSTEROSCOPY;  Surgeon: Marylynn Pearson, MD;  Location: [facility];  Service: Gynecology;;  with removal of polyps  . Intrauterine device (iud) insertion  03/21/2010  . Carpal tunnel release Right 07/08/2014    Procedure: RIGHT CARPAL TUNNEL RELEASE;  Surgeon: Sanjuana Kava, MD;  Location: [facility];  Service: Orthopedics;  Laterality: Right;  . Cholecystectomy      in for   Chief Complaint  Patient presents with  . Abdominal Pain      HPI:    [name]  is a 49 y.o. female, with history of morbid obesity, gastric bypass surgery 10 years ago, essential hypertension, chronic kidney disease stage IV with baseline creatinine close to 2.5, anemia of chronic disease, bipolar disorder, seizures, comes to the hospital with sudden onset of abdominal pain, diarrhea, nausea and vomiting starting yesterday night. Denies any fever chills. No chest or shortness of breath, no exposure to bad foods, no other family members sick.  She came to the ER for above complaints, in the ER CT scan of the abdomen shows possible small bowel obstruction, patient actually had bowel movement  earlier today and is passing flatus, she currently feels better. General surgery was consulted and I was requested to admit the patient.    Review of systems:    In addition to the HPI above,   No Fever-chills, No Headache, No changes with Vision or hearing, No problems swallowing food or Liquids, No Chest pain, Cough or Shortness of Breath, Abnormal symptoms as above, No Blood in stool or Urine, No dysuria, No new skin rashes or bruises, No new joints pains-aches,  No new weakness, tingling, numbness in any extremity, No recent weight gain or loss, No polyuria, polydypsia or polyphagia, No significant Mental Stressors.  A full 10 point Review of Systems was done, except as stated above, all other Review of Systems were negative.    Social History:     Social History  Substance Use Topics  . Smoking status: Former Smoker -- 0.25 packs/day for 4 years    Types: Cigarettes    Quit  date: 03/21/1988  . Smokeless tobacco: Never Used  . Alcohol Use: Yes     Comment: social    Lives - at home and fairly mobile      Family History :     Family History  Problem Relation Age of Onset  . Hypertension Mother   . Kidney failure Mother   . Hypertension Father   . Diabetes Sister   . Hypertension Sister   . Hypertension Brother   . Hypertension Maternal Aunt   . Hypertension Maternal Uncle   . Hypertension Maternal Grandmother   . Hypertension Maternal Grandfather   . Hypertension Paternal Grandmother   . Hypertension Paternal Grandfather   . Diabetes Cousin        Home Medications:   Prior to Admission medications   Medication Sig Start Date End Date Taking? Authorizing Provider  amLODipine (NORVASC) 10 MG tablet Take 10 mg by mouth daily.   Yes Historical Provider, MD  carvedilol (COREG) 12.5 MG tablet Take 1 tablet (12.5 mg total) by mouth 2 (two) times daily with a meal. 10/23/14  Yes [name], FNP  celecoxib (CELEBREX) 200 MG capsule Take 200 mg by mouth  daily as needed for mild pain.    Yes Historical Provider, MD  diazepam (VALIUM) 10 MG tablet Take 1 tablet (10 mg total) by mouth 3 (three) times daily as needed for anxiety. 01/19/15  Yes [name], FNP  diphenhydrAMINE (BENADRYL) 25 MG tablet Take 50 mg by mouth 2 (two) times daily as needed for allergies.   Yes Historical Provider, MD  furosemide (LASIX) 20 MG tablet Take 20 mg by mouth daily as needed for fluid.    Yes Historical Provider, MD  lidocaine (LIDODERM) 5 % Place 1 patch onto the skin daily as needed (for pain). Remove & Discard patch within 12 hours or as directed by MD   Yes Historical Provider, MD  losartan (COZAAR) 25 MG tablet TAKE ONE TABLET BY MOUTH ONCE DAILY. 12/19/14  Yes [name], FNP  tiZANidine (ZANAFLEX) 4 MG tablet Take 4 mg by mouth every 8 (eight) hours as needed for muscle spasms.   Yes Historical Provider, MD  topiramate (TOPAMAX) 100 MG tablet TAKE (2) TABLETS BY MOUTH AT BEDTIME. 01/19/15  Yes [name], FNP  traMADol (ULTRAM-ER) 200 MG 24 hr tablet Take 200 mg by mouth daily.   Yes Historical Provider, MD  venlafaxine XR (EFFEXOR-XR) 150 MG 24 hr capsule TAKE ONE CAPSULE BY MOUTH TWICE DAILY. 12/01/14  Yes [name], FNP  azithromycin (ZITHROMAX) 250 MG tablet Take 2 tablets for 1 day and then 1 tablet daily for 4 days Patient not taking: Reported on 03/07/2015 11/18/14   [name], FNP  HYDROcodone-homatropine [facility]) 5-1.5 MG/5ML syrup Take 5 mLs by mouth every 8 (eight) hours as needed for cough. Patient not taking: Reported on 03/07/2015 11/18/14   [name], FNP     Allergies:     Allergies  Allergen Reactions  . Ondansetron Nausea And Vomiting  . Quetiapine     Other reaction(s): Other (See Comments) Possible Hair Loss  . Sulfa Antibiotics Rash     Physical Exam:   Vitals  Blood pressure 136/93, pulse 97, temperature 98 F (36.7 C), temperature source Oral, resp. rate 17, height 4\' 11"  (1.499 m),  weight 86.183 kg (190 lb), SpO2 100 %.   1. General middle-aged obese African-American female lying in bed in NAD,     2. Normal  affect and insight, Not Suicidal or Homicidal, Awake Alert, Oriented X 3.  3. No F.N deficits, ALL C.Nerves Intact, Strength 5/5 all 4 extremities, Sensation intact all 4 extremities, Plantars down going.  4. Ears and Eyes appear Normal, Conjunctivae clear, PERRLA. Moist Oral Mucosa.  5. Supple Neck, No JVD, No cervical lymphadenopathy appriciated, No Carotid Bruits.  6. Symmetrical Chest wall movement, Good air movement bilaterally, CTAB.  7. RRR, No Gallops, Rubs or Murmurs, No Parasternal Heave.  8. Positive Bowel Sounds, Abdomen Soft, No tenderness, No organomegaly appriciated,No rebound -guarding or rigidity.  9.  No Cyanosis, Normal Skin Turgor, No Skin Rash or Bruise.  10. Good muscle tone,  joints appear normal , no effusions, Normal ROM.  11. No Palpable Lymph Nodes in Neck or Axillae      Data Review:    CBC  Recent Labs Lab 03/07/15 0913  WBC 5.5  HGB 13.8  HCT 42.7  PLT 143*  MCV 89.0  MCH 28.8  MCHC 32.3  RDW 14.7  LYMPHSABS 0.9  MONOABS 0.1  EOSABS 0.0  BASOSABS 0.0   ------------------------------------------------------------------------------------------------------------------  Chemistries   Recent Labs Lab 03/07/15 0913  NA 141  K 3.7  CL 106  CO2 23  GLUCOSE 146*  BUN 37*  CREATININE 2.43*  CALCIUM 8.5*  AST 20  ALT 18  ALKPHOS 193*  BILITOT 0.8   ------------------------------------------------------------------------------------------------------------------ estimated creatinine clearance is 27 mL/min (by C-G formula based on Cr of 2.43). ------------------------------------------------------------------------------------------------------------------ No results for input(s): TSH, T4TOTAL, T3FREE, THYROIDAB in the last 72 hours.  Invalid input(s): FREET3   Coagulation profile No results for  input(s): INR, PROTIME in the last 168 hours. ------------------------------------------------------------------------------------------------------------------- No results for input(s): DDIMER in the last 72 hours. -------------------------------------------------------------------------------------------------------------------  Cardiac Enzymes No results for input(s): CKMB, TROPONINI, MYOGLOBIN in the last 168 hours.  Invalid input(s): CK ------------------------------------------------------------------------------------------------------------------ Invalid input(s): POCBNP   ---------------------------------------------------------------------------------------------------------------  Urinalysis    Component Value Date/Time   COLORURINE YELLOW 07/03/2014 1130   APPEARANCEUR CLEAR 07/03/2014 1130   LABSPEC 1.025 07/03/2014 1130   PHURINE 6.0 07/03/2014 1130   GLUCOSEU NEGATIVE 07/03/2014 1130   HGBUR LARGE* 07/03/2014 1130   BILIRUBINUR NEGATIVE 07/03/2014 1130   KETONESUR TRACE* 07/03/2014 1130   PROTEINUR 100* 07/03/2014 1130   UROBILINOGEN 0.2 07/03/2014 1130   NITRITE NEGATIVE 07/03/2014 1130   LEUKOCYTESUR NEGATIVE 07/03/2014 1130    ----------------------------------------------------------------------------------------------------------------   Imaging Results:    Ct Abdomen Pelvis Wo Contrast  03/07/2015  CLINICAL DATA:  Epigastric pain EXAM: CT ABDOMEN AND PELVIS WITHOUT CONTRAST TECHNIQUE: Multidetector CT imaging of the abdomen and pelvis was performed following the standard protocol without IV contrast. COMPARISON:  03/25/2014 FINDINGS: Lung bases are free of acute infiltrate or sizable effusion. The gallbladder has been surgically removed. The liver, spleen, adrenal glands and pancreas are within normal limits. A hiatal hernia is noted. Postsurgical changes are noted in the stomach. The kidneys are well visualized bilaterally. No calculi or obstructive changes  are noted. The appendix is within normal limits. The colon is within normal limits. There is a focally dilated loop of small bowel identified in the left mid abdomen in an area of prior surgery. No definitive obstructing lesion is seen although there is considerable fecalization of bowel contents. This may represent an area of postsurgical stenosis. An IUD is noted within the uterus. The bladder is well distended. No acute bony abnormality is seen. IMPRESSION: Focally dilated loop of small bowel as described in the left mid abdomen. This may represent  an area of postsurgical stenosis with partial small bowel obstruction. No other focal abnormality is seen. Electronically Signed   By: Inez Catalina M.D.   On: 03/07/2015 10:42        Assessment & Plan:     1. Gastroenteritis versus partial small bowel obstruction. We'll keep her in 23 hour observation, clear liquids only if she can tolerate, gentle IV fluids, supportive care, general surgery to evaluate. Pain and nausea control.  2. Essential hypertension. Home medications to be continued, continue Norvasc and Coreg, hold Lasix, hold ARB as she has the potential to get dehydrated.  3. CK D stage IV. Creatinine at baseline. For now old and ARB and hydrating gently. We'll monitor.  4. History of gastric bypass surgery 10 years ago. Stable.  5. History of seizures. Home medications continued.   DVT Prophylaxis Heparin    AM Labs Ordered, also please review Full Orders  Family Communication: Admission, patients condition and plan of care including tests being ordered have been discussed with the patient and son who indicate understanding and agree with the plan and Code Status.  Code Status Full  Likely DC to  Home 1-2 days  Condition Fair  Time spent in minutes : 35    SINGH,PRASHANT K M.D on 03/07/2015 at 12:29 PM  Between 7am to 7pm - Pager - [phone]  After 7pm go to [email or web address] - password [facility]  [facility] - Office   [phone]

## 2015-03-07 NOTE — ED Notes (Signed)
Starting yesterday, pt states she has abdominal pain, nausea, and her legs feel funny.  States she wasn't able to sleep all night. Denies fevers.

## 2015-03-08 ENCOUNTER — Observation Stay (HOSPITAL_COMMUNITY): Payer: Medicare Other

## 2015-03-08 DIAGNOSIS — Z9889 Other specified postprocedural states: Secondary | ICD-10-CM

## 2015-03-08 DIAGNOSIS — K5669 Other intestinal obstruction: Secondary | ICD-10-CM | POA: Diagnosis not present

## 2015-03-08 DIAGNOSIS — F3160 Bipolar disorder, current episode mixed, unspecified: Secondary | ICD-10-CM | POA: Diagnosis not present

## 2015-03-08 DIAGNOSIS — I10 Essential (primary) hypertension: Secondary | ICD-10-CM

## 2015-03-08 DIAGNOSIS — K5660 Unspecified intestinal obstruction: Secondary | ICD-10-CM | POA: Diagnosis not present

## 2015-03-08 LAB — CBC
HCT: 34.2 % — ABNORMAL LOW (ref 36.0–46.0)
Hemoglobin: 11 g/dL — ABNORMAL LOW (ref 12.0–15.0)
MCH: 28.6 pg (ref 26.0–34.0)
MCHC: 32.2 g/dL (ref 30.0–36.0)
MCV: 89.1 fL (ref 78.0–100.0)
Platelets: 112 10*3/uL — ABNORMAL LOW (ref 150–400)
RBC: 3.84 MIL/uL — ABNORMAL LOW (ref 3.87–5.11)
RDW: 14.9 % (ref 11.5–15.5)
WBC: 4 10*3/uL (ref 4.0–10.5)

## 2015-03-08 LAB — PROTIME-INR
INR: 1.32 (ref 0.00–1.49)
Prothrombin Time: 16.5 seconds — ABNORMAL HIGH (ref 11.6–15.2)

## 2015-03-08 LAB — BASIC METABOLIC PANEL
Anion gap: 8 (ref 5–15)
Anion gap: 8 (ref 5–15)
BUN: 33 mg/dL — ABNORMAL HIGH (ref 6–20)
BUN: 29 mg/dL — ABNORMAL HIGH (ref 6–20)
CO2: 24 mmol/L (ref 22–32)
CO2: 21 mmol/L — ABNORMAL LOW (ref 22–32)
Calcium: 7.7 mg/dL — ABNORMAL LOW (ref 8.9–10.3)
Calcium: 7.8 mg/dL — ABNORMAL LOW (ref 8.9–10.3)
Chloride: 115 mmol/L — ABNORMAL HIGH (ref 101–111)
Chloride: 113 mmol/L — ABNORMAL HIGH (ref 101–111)
Creatinine, Ser: 2.22 mg/dL — ABNORMAL HIGH (ref 0.44–1.00)
Creatinine, Ser: 2.22 mg/dL — ABNORMAL HIGH (ref 0.44–1.00)
GFR calc Af Amer: 29 mL/min — ABNORMAL LOW (ref >60)
GFR calc Af Amer: 29 mL/min — ABNORMAL LOW (ref >60)
GFR calc non Af Amer: 25 mL/min — ABNORMAL LOW (ref >60)
GFR calc non Af Amer: 25 mL/min — ABNORMAL LOW (ref >60)
Glucose, Bld: 91 mg/dL (ref 65–99)
Glucose, Bld: 80 mg/dL (ref 65–99)
Potassium: 2.9 mmol/L — ABNORMAL LOW (ref 3.5–5.1)
Potassium: 4.1 mmol/L (ref 3.5–5.1)
Sodium: 147 mmol/L — ABNORMAL HIGH (ref 135–145)
Sodium: 142 mmol/L (ref 135–145)

## 2015-03-08 LAB — MAGNESIUM: Magnesium: 1.7 mg/dL (ref 1.7–2.4)

## 2015-03-08 MED ORDER — POTASSIUM CHLORIDE CRYS ER 20 MEQ PO TBCR
40.0000 meq | EXTENDED_RELEASE_TABLET | ORAL | Status: AC
  Administered 2015-03-08 (×3): 40 meq via ORAL
  Filled 2015-03-08 (×3): qty 2

## 2015-03-08 MED ORDER — MAGNESIUM SULFATE 2 GM/50ML IV SOLN
2.0000 g | Freq: Once | INTRAVENOUS | Status: AC
  Administered 2015-03-08: 2 g via INTRAVENOUS
  Filled 2015-03-08: qty 50

## 2015-03-08 NOTE — Progress Notes (Addendum)
General Surgery Note  LOS: 1 day  POD -     Assessment/Plan: 1.  SBO  Odd "fecalizaiton" of small bowel near [place]  For KUB this AM.  This is probably more of a chronic problem than an acute issue.  The questions is should management continue here or should she have follow up with her primary bariatric doctor.  She has not seen him in over 5 years.  2.  HTN 3.  Bipolar disease  She does not believe diagnosis.  She said that she was "fired" by Dr. Toy Care 4.  History of open gastric bypass  Surgeon Dr. Bernadette Hoit in [place], [place] in 2000  Current weight - 183, BMI - 38.5  5. DVT prophylaxis - SQ Heparin 6.  Creatinine elevated  Chronic renal disease (1.69 in April 2016) with acute injury 7.  History of marginal ulcer - seen by Dr. Benny Lennert 8.  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  But is eating full liquid diet. Objective:   Filed Vitals:   03/07/15 2240 03/08/15 0627  BP: 108/60 116/76  Pulse: 87 77  Temp: 98.8 F (37.1 C) 98 F (36.7 C)  Resp: 16 18     Intake/Output from previous day:  12/17 0701 - 12/18 0700 In: 1016.3 [P.O.:120; I.V.:896.3] Out: -   Intake/Output this shift:      Physical Exam:   General: Obese AAF who is alert and oriented.    HEENT: Normal. Pupils equal. .   Lungs: Clear   Abdomen: BS present, has fullness in LUQ (consistent area seen on CT scan)     Lab Results:    Recent Labs  03/07/15 0913 03/08/15 0545  WBC 5.5 4.0  HGB 13.8 11.0*  HCT 42.7 34.2*  PLT 143* 112*    BMET   Recent Labs  03/07/15 0913 03/08/15 0545  NA 141 147*  K 3.7 2.9*  CL 106 115*  CO2 23 24  GLUCOSE 146* 91  BUN 37* 33*  CREATININE 2.43* 2.22*  CALCIUM 8.5* 7.7*    PT/INR   Recent Labs  03/08/15 0545  LABPROT 16.5*  INR 1.32    ABG  No results for input(s): PHART, HCO3 in the last 72 hours.  Invalid input(s): PCO2, PO2   Studies/Results:  Ct Abdomen Pelvis Wo Contrast  03/07/2015  CLINICAL DATA:  Epigastric pain EXAM: CT ABDOMEN AND PELVIS WITHOUT CONTRAST TECHNIQUE: Multidetector CT imaging of the abdomen and pelvis was performed following the standard protocol without IV contrast. COMPARISON:  03/25/2014 FINDINGS: Lung bases are free of acute infiltrate or sizable effusion. The gallbladder has been surgically removed. The liver, spleen, adrenal glands and pancreas are within normal limits. A hiatal hernia is noted. Postsurgical changes are noted in the stomach. The kidneys are well visualized bilaterally. No calculi or obstructive changes are noted. The appendix is within normal limits. The colon is within normal limits. There is a focally dilated loop of small bowel identified in the left mid abdomen in an area of prior surgery. No definitive obstructing lesion is seen although there is considerable fecalization of bowel contents. This may represent an area of postsurgical stenosis. An IUD is noted within the uterus. The bladder is well distended. No  acute bony abnormality is seen. IMPRESSION: Focally dilated loop of small bowel as described in the left mid abdomen. This may represent an area of postsurgical stenosis with partial small bowel obstruction. No other focal abnormality is seen. Electronically Signed   By: Inez Catalina M.D.   On: 03/07/2015 10:42     Anti-infectives:   Anti-infectives    None      Alphonsa Overall, MD, FACS Pager: [phone] Surgery Office: [phone] 03/08/2015

## 2015-03-08 NOTE — Progress Notes (Signed)
PROGRESS NOTE  [name] [id number] DOB: [birth year] DOA: 03/07/2015 PCP: Mauricio Po, FNP  Assessment/Plan: Gastroenteritis versus partial small bowel obstruction.  -seen by surgery -ambulate patient - x ray pending  Hypokalemia- -replete -check Mg  Essential hypertension. -hold BP meds for low BP  CK D stage IV. Creatinine at baseline.  History of gastric bypass surgery 10 years ago. Stable. Needs to follow up with surgeon  History of seizures. Home medications continued.  Code Status: full Family Communication: patient Disposition Plan: home in AM   Consultants:  surgery  Procedures:      HPI/Subjective: Ate oatmeal this AM No pain Passing gas  Objective: Filed Vitals:   03/08/15 0627 03/08/15 0855  BP: 116/76 96/67  Pulse: 77 91  Temp: 98 F (36.7 C)   Resp: 18     Intake/Output Summary (Last 24 hours) at 03/08/15 0920 Last data filed at 03/08/15 0600  Gross per 24 hour  Intake 1016.25 ml  Output      0 ml  Net 1016.25 ml   Filed Weights   03/07/15 0837 03/08/15 0627  Weight: 86.183 kg (190 lb) 88.179 kg (194 lb 6.4 oz)    Exam:   General:  Awake, NAD  Cardiovascular: rrr  Respiratory: clear  Abdomen: +BS, soft, NT  Musculoskeletal: no edema   Data Reviewed: Basic Metabolic Panel:  Recent Labs Lab 03/07/15 0913 03/08/15 0545  NA 141 147*  K 3.7 2.9*  CL 106 115*  CO2 23 24  GLUCOSE 146* 91  BUN 37* 33*  CREATININE 2.43* 2.22*  CALCIUM 8.5* 7.7*   Liver Function Tests:  Recent Labs Lab 03/07/15 0913  AST 20  ALT 18  ALKPHOS 193*  BILITOT 0.8  PROT 7.6  ALBUMIN 4.7    Recent Labs Lab 03/07/15 0913  LIPASE 41   No results for input(s): AMMONIA in the last 168 hours. CBC:  Recent Labs Lab 03/07/15 0913 03/08/15 0545  WBC 5.5 4.0  NEUTROABS 4.4  --   HGB 13.8 11.0*  HCT 42.7 34.2*  MCV 89.0 89.1  PLT 143* 112*   Cardiac Enzymes: No results for input(s): CKTOTAL, CKMB,  CKMBINDEX, TROPONINI in the last 168 hours. BNP (last 3 results) No results for input(s): BNP in the last 8760 hours.  ProBNP (last 3 results) No results for input(s): PROBNP in the last 8760 hours.  CBG: No results for input(s): GLUCAP in the last 168 hours.  No results found for this or any previous visit (from the past 240 hour(s)).   Studies: Ct Abdomen Pelvis Wo Contrast  03/07/2015  CLINICAL DATA:  Epigastric pain EXAM: CT ABDOMEN AND PELVIS WITHOUT CONTRAST TECHNIQUE: Multidetector CT imaging of the abdomen and pelvis was performed following the standard protocol without IV contrast. COMPARISON:  03/25/2014 FINDINGS: Lung bases are free of acute infiltrate or sizable effusion. The gallbladder has been surgically removed. The liver, spleen, adrenal glands and pancreas are within normal limits. A hiatal hernia is noted. Postsurgical changes are noted in the stomach. The kidneys are well visualized bilaterally. No calculi or obstructive changes are noted. The appendix is within normal limits. The colon is within normal limits. There is a focally dilated loop of small bowel identified in the left mid abdomen in an area of prior surgery. No definitive obstructing lesion is seen although there is considerable fecalization of bowel contents. This may represent an area of postsurgical stenosis. An IUD is noted within the uterus. The bladder is well distended. No acute  bony abnormality is seen. IMPRESSION: Focally dilated loop of small bowel as described in the left mid abdomen. This may represent an area of postsurgical stenosis with partial small bowel obstruction. No other focal abnormality is seen. Electronically Signed   By: Inez Catalina M.D.   On: 03/07/2015 10:42    Scheduled Meds: . amLODipine  10 mg Oral Daily  . carvedilol  12.5 mg Oral BID WC  . heparin  5,000 Units Subcutaneous 3 times per day  . pantoprazole (PROTONIX) IV  40 mg Intravenous Q12H  . potassium chloride  40 mEq Oral  Q4H  . topiramate  200 mg Oral Daily  . venlafaxine XR  150 mg Oral BID   Continuous Infusions:  Antibiotics Given (last 72 hours)    None      Principal Problem:   Small bowel obstruction ([facility]) Active Problems:   Bipolar disorder ([place])   Essential hypertension, benign   Abdominal pain   H/O gastric bypass   Gastroenteritis    Time spent: 25 min    [facility] Pager [phone]. If 7PM-7AM, please contact night-coverage at [email or web address], password [facility] 03/08/2015, 9:20 AM  LOS: 1 day

## 2015-03-09 DIAGNOSIS — Z9889 Other specified postprocedural states: Secondary | ICD-10-CM | POA: Diagnosis not present

## 2015-03-09 DIAGNOSIS — K5669 Other intestinal obstruction: Secondary | ICD-10-CM | POA: Diagnosis not present

## 2015-03-09 DIAGNOSIS — K529 Noninfective gastroenteritis and colitis, unspecified: Secondary | ICD-10-CM

## 2015-03-09 DIAGNOSIS — I10 Essential (primary) hypertension: Secondary | ICD-10-CM | POA: Diagnosis not present

## 2015-03-09 LAB — BASIC METABOLIC PANEL WITH GFR
Anion gap: 7 (ref 5–15)
BUN: 26 mg/dL — ABNORMAL HIGH (ref 6–20)
CO2: 22 mmol/L (ref 22–32)
Calcium: 8.2 mg/dL — ABNORMAL LOW (ref 8.9–10.3)
Chloride: 116 mmol/L — ABNORMAL HIGH (ref 101–111)
Creatinine, Ser: 2.17 mg/dL — ABNORMAL HIGH (ref 0.44–1.00)
GFR calc Af Amer: 30 mL/min — ABNORMAL LOW
GFR calc non Af Amer: 26 mL/min — ABNORMAL LOW
Glucose, Bld: 82 mg/dL (ref 65–99)
Potassium: 4.3 mmol/L (ref 3.5–5.1)
Sodium: 145 mmol/L (ref 135–145)

## 2015-03-09 MED ORDER — BISACODYL 10 MG RE SUPP: 10.0000 mg | Freq: Every day | RECTAL | Status: DC | PRN

## 2015-03-09 MED ORDER — PANTOPRAZOLE SODIUM 40 MG PO TBEC
40.0000 mg | DELAYED_RELEASE_TABLET | Freq: Two times a day (BID) | ORAL | Status: DC
  Administered 2015-03-09: 40 mg via ORAL
  Filled 2015-03-09: qty 1

## 2015-03-09 MED ORDER — LACTULOSE 10 GM/15ML PO SOLN
20.0000 g | Freq: Once | ORAL | Status: AC
  Administered 2015-03-09: 20 g via ORAL
  Filled 2015-03-09 (×2): qty 30

## 2015-03-09 MED ORDER — LACTULOSE 10 GM/15ML PO SOLN: 20.0000 g | Freq: Once | ORAL | Status: DC

## 2015-03-09 NOTE — Progress Notes (Signed)
Subjective: Feels better. Tolerating regular food  Objective: Vital signs in last 24 hours: Temp:  [97.8 F (36.6 C)-98.2 F (36.8 C)] 97.8 F (36.6 C) (12/19 0600) Pulse Rate:  [70-86] 70 (12/19 0600) Resp:  [16-18] 18 (12/19 0600) BP: (113-121)/(66-77) 121/77 mmHg (12/19 0600) SpO2:  [97 %-100 %] 97 % (12/19 0600) Weight:  [89.268 kg (196 lb 12.8 oz)] 89.268 kg (196 lb 12.8 oz) (12/19 0004) Last BM Date: 03/07/15  Intake/Output from previous day:   Intake/Output this shift:    Resp: clear to auscultation bilaterally Cardio: regular rate and rhythm GI: soft, nontender. passing flatus  Lab Results:   Recent Labs  03/07/15 0913 03/08/15 0545  WBC 5.5 4.0  HGB 13.8 11.0*  HCT 42.7 34.2*  PLT 143* 112*   BMET  Recent Labs  03/08/15 1853 03/09/15 0533  NA 142 145  K 4.1 4.3  CL 113* 116*  CO2 21* 22  GLUCOSE 80 82  BUN 29* 26*  CREATININE 2.22* 2.17*  CALCIUM 7.8* 8.2*   PT/INR  Recent Labs  03/08/15 0545  LABPROT 16.5*  INR 1.32   ABG No results for input(s): PHART, HCO3 in the last 72 hours.  Invalid input(s): PCO2, PO2  Studies/Results: Ct Abdomen Pelvis Wo Contrast  03/07/2015  CLINICAL DATA:  Epigastric pain EXAM: CT ABDOMEN AND PELVIS WITHOUT CONTRAST TECHNIQUE: Multidetector CT imaging of the abdomen and pelvis was performed following the standard protocol without IV contrast. COMPARISON:  03/25/2014 FINDINGS: Lung bases are free of acute infiltrate or sizable effusion. The gallbladder has been surgically removed. The liver, spleen, adrenal glands and pancreas are within normal limits. A hiatal hernia is noted. Postsurgical changes are noted in the stomach. The kidneys are well visualized bilaterally. No calculi or obstructive changes are noted. The appendix is within normal limits. The colon is within normal limits. There is a focally dilated loop of small bowel identified in the left mid abdomen in an area of prior surgery. No definitive  obstructing lesion is seen although there is considerable fecalization of bowel contents. This may represent an area of postsurgical stenosis. An IUD is noted within the uterus. The bladder is well distended. No acute bony abnormality is seen. IMPRESSION: Focally dilated loop of small bowel as described in the left mid abdomen. This may represent an area of postsurgical stenosis with partial small bowel obstruction. No other focal abnormality is seen. Electronically Signed   By: Inez Catalina M.D.   On: 03/07/2015 10:42   Dg Abd 2 Views  03/08/2015  CLINICAL DATA:  Small bowel obstruction, IUD, gastric bypass, hypertension EXAM: ABDOMEN - 2 VIEW COMPARISON:  CT abdomen pelvis 03/07/2015 FINDINGS: Stool and minimal contrast in colon. Stable bowel gas pattern with again identified gaseous distention of a bowel loop in the LEFT mid abdomen. Staple line at expected position of stomach. No additional bowel dilatation, bowel wall thickening, or free air. Lung bases grossly clear. Bilateral pelvic phleboliths. No acute osseous findings. IMPRESSION: Persisting gaseous distention of a small bowel loop in the lateral LEFT mid abdomen, unchanged from prior CT. Electronically Signed   By: Lavonia Dana M.D.   On: 03/08/2015 10:33    Anti-infectives: Anti-infectives    None      Assessment/Plan: s/p * No surgery found * She is tolerating regular food. At this point she could go home but she will need workup of her CT findings as an outpt  Follow up with Dr. Lucia Gaskins in the next couple weeks  LOS: 2 days    TOTH III,PAUL S 03/09/2015

## 2015-03-09 NOTE — Discharge Summary (Signed)
Physician Discharge Summary  [name] [id number] DOB: 03/05/1966 DOA: 03/07/2015  PCP: Nicole Po, FNP  Admit date: 03/07/2015 Discharge date: 03/09/2015  Time spent: 35 minutes  Recommendations for Outpatient Follow-up:  1. Dr. Lucia Vincent 2 weeks   Discharge Diagnoses:  Principal Problem:   Small bowel obstruction ([place]) Active Problems:   Bipolar disorder ([place])   Essential hypertension, benign   Abdominal pain   H/O gastric bypass   Gastroenteritis   Discharge Condition: improved  Diet recommendation: bariatric  Filed Weights   03/07/15 0837 03/08/15 0627 03/09/15 0004  Weight: 86.183 kg (190 lb) 88.179 kg (194 lb 6.4 oz) 89.268 kg (196 lb 12.8 oz)    History of present illness:  [name] is a 49 y.o. female, with history of morbid obesity, gastric bypass surgery 10 years ago, essential hypertension, chronic kidney disease stage IV with baseline creatinine close to 2.5, anemia of chronic disease, bipolar disorder, seizures, comes to the hospital with sudden onset of abdominal pain, diarrhea, nausea and vomiting starting yesterday night. Denies any fever chills. No chest or shortness of breath, no exposure to bad foods, no other family members sick.  She came to the ER for above complaints, in the ER CT scan of the abdomen shows possible small bowel obstruction, patient actually had bowel movement earlier today and is passing flatus, she currently feels better. General surgery was consulted and I was requested to admit the patient.  Hospital Course:  Gastroenteritis versus partial small bowel obstruction.  -seen by surgery -ambulate patient -improved and patient tolerating regular diet-- to follow with Dr. Lucia Vincent  Hypokalemia- -repleted -replaced Mg  Essential hypertension. -resume some home meds-- BP well controlled here off some  CK D stage IV. Creatinine at baseline.  History of gastric bypass surgery 10 years ago. Stable. Needs to follow  up with surgeon  History of seizures. Home medications continued.  Procedures:    Consultations:  surgery  Discharge Exam: Filed Vitals:   03/08/15 1458 03/09/15 0600  BP: 113/66 121/77  Pulse: 86 70  Temp: 98.2 F (36.8 C) 97.8 F (36.6 C)  Resp: 16 18    General: awake, NAD +BS, soft, NT  Discharge Instructions   Discharge Instructions    Diet - low sodium heart healthy    Complete by:  As directed      Increase activity slowly    Complete by:  As directed           Current Discharge Medication List    START taking these medications   Details  lactulose (CHRONULAC) 10 GM/15ML solution Take 30 mLs (20 g total) by mouth once. Qty: 240 mL, Refills: 0      CONTINUE these medications which have NOT CHANGED   Details  amLODipine (NORVASC) 10 MG tablet Take 10 mg by mouth daily.    carvedilol (COREG) 12.5 MG tablet Take 1 tablet (12.5 mg total) by mouth 2 (two) times daily with a meal. Qty: 60 tablet, Refills: 5    celecoxib (CELEBREX) 200 MG capsule Take 200 mg by mouth daily as needed for mild pain.     diazepam (VALIUM) 10 MG tablet Take 1 tablet (10 mg total) by mouth 3 (three) times daily as needed for anxiety. Qty: 90 tablet, Refills: 0   Associated Diagnoses: Bipolar affective disorder, current episode mixed, current episode severity unspecified (HCC)    diphenhydrAMINE (BENADRYL) 25 MG tablet Take 50 mg by mouth 2 (two) times daily as needed for allergies.  furosemide (LASIX) 20 MG tablet Take 20 mg by mouth daily as needed for fluid.     lidocaine (LIDODERM) 5 % Place 1 patch onto the skin daily as needed (for pain). Remove & Discard patch within 12 hours or as directed by MD    tiZANidine (ZANAFLEX) 4 MG tablet Take 4 mg by mouth every 8 (eight) hours as needed for muscle spasms.    topiramate (TOPAMAX) 100 MG tablet TAKE (2) TABLETS BY MOUTH AT BEDTIME. Qty: 60 tablet, Refills: 2    traMADol (ULTRAM-ER) 200 MG 24 hr tablet Take 200 mg by  mouth daily.    venlafaxine XR (EFFEXOR-XR) 150 MG 24 hr capsule TAKE ONE CAPSULE BY MOUTH TWICE DAILY. Qty: 60 capsule, Refills: 5      STOP taking these medications     losartan (COZAAR) 25 MG tablet      azithromycin (ZITHROMAX) 250 MG tablet      HYDROcodone-homatropine (HYCODAN) 5-1.5 MG/5ML syrup        Allergies  Allergen Reactions  . Ondansetron Nausea And Vomiting  . Quetiapine     Other reaction(s): Other (See Comments) Possible Hair Loss  . Sulfa Antibiotics Rash      The results of significant diagnostics from this hospitalization (including imaging, microbiology, ancillary and laboratory) are listed below for reference.    Significant Diagnostic Studies: Ct Abdomen Pelvis Wo Contrast  03/07/2015  CLINICAL DATA:  Epigastric pain EXAM: CT ABDOMEN AND PELVIS WITHOUT CONTRAST TECHNIQUE: Multidetector CT imaging of the abdomen and pelvis was performed following the standard protocol without IV contrast. COMPARISON:  03/25/2014 FINDINGS: Lung bases are free of acute infiltrate or sizable effusion. The gallbladder has been surgically removed. The liver, spleen, adrenal glands and pancreas are within normal limits. A hiatal hernia is noted. Postsurgical changes are noted in the stomach. The kidneys are well visualized bilaterally. No calculi or obstructive changes are noted. The appendix is within normal limits. The colon is within normal limits. There is a focally dilated loop of small bowel identified in the left mid abdomen in an area of prior surgery. No definitive obstructing lesion is seen although there is considerable fecalization of bowel contents. This may represent an area of postsurgical stenosis. An IUD is noted within the uterus. The bladder is well distended. No acute bony abnormality is seen. IMPRESSION: Focally dilated loop of small bowel as described in the left mid abdomen. This may represent an area of postsurgical stenosis with partial small bowel  obstruction. No other focal abnormality is seen. Electronically Signed   By: Inez Catalina M.D.   On: 03/07/2015 10:42   Dg Abd 2 Views  03/08/2015  CLINICAL DATA:  Small bowel obstruction, IUD, gastric bypass, hypertension EXAM: ABDOMEN - 2 VIEW COMPARISON:  CT abdomen pelvis 03/07/2015 FINDINGS: Stool and minimal contrast in colon. Stable bowel gas pattern with again identified gaseous distention of a bowel loop in the LEFT mid abdomen. Staple line at expected position of stomach. No additional bowel dilatation, bowel wall thickening, or free air. Lung bases grossly clear. Bilateral pelvic phleboliths. No acute osseous findings. IMPRESSION: Persisting gaseous distention of a small bowel loop in the lateral LEFT mid abdomen, unchanged from prior CT. Electronically Signed   By: Lavonia Dana M.D.   On: 03/08/2015 10:33    Microbiology: No results found for this or any previous visit (from the past 240 hour(s)).   Labs: Basic Metabolic Panel:  Recent Labs Lab 03/07/15 0913 03/08/15 0526 03/08/15 0545 03/08/15 1853  03/09/15 0533  NA 141  --  147* 142 145  K 3.7  --  2.9* 4.1 4.3  CL 106  --  115* 113* 116*  CO2 23  --  24 21* 22  GLUCOSE 146*  --  91 80 82  BUN 37*  --  33* 29* 26*  CREATININE 2.43*  --  2.22* 2.22* 2.17*  CALCIUM 8.5*  --  7.7* 7.8* 8.2*  MG  --  1.7  --   --   --    Liver Function Tests:  Recent Labs Lab 03/07/15 0913  AST 20  ALT 18  ALKPHOS 193*  BILITOT 0.8  PROT 7.6  ALBUMIN 4.7    Recent Labs Lab 03/07/15 0913  LIPASE 41   No results for input(s): AMMONIA in the last 168 hours. CBC:  Recent Labs Lab 03/07/15 0913 03/08/15 0545  WBC 5.5 4.0  NEUTROABS 4.4  --   HGB 13.8 11.0*  HCT 42.7 34.2*  MCV 89.0 89.1  PLT 143* 112*   Cardiac Enzymes: No results for input(s): CKTOTAL, CKMB, CKMBINDEX, TROPONINI in the last 168 hours. BNP: BNP (last 3 results) No results for input(s): BNP in the last 8760 hours.  ProBNP (last 3 results) No  results for input(s): PROBNP in the last 8760 hours.  CBG: No results for input(s): GLUCAP in the last 168 hours.     Signed:  Jamicah Anstead Alison Stalling  [facility] 03/09/2015, 8:02 AM

## 2015-03-19 ENCOUNTER — Ambulatory Visit (INDEPENDENT_AMBULATORY_CARE_PROVIDER_SITE_OTHER): Payer: Medicare Other | Admitting: Family

## 2015-03-19 ENCOUNTER — Encounter: Payer: Self-pay | Admitting: Family

## 2015-03-19 VITALS — BP 102/80 | HR 84 | Temp 98.0°F | Resp 18 | Ht 61.0 in | Wt 190.0 lb

## 2015-03-19 DIAGNOSIS — K56609 Unspecified intestinal obstruction, unspecified as to partial versus complete obstruction: Secondary | ICD-10-CM

## 2015-03-19 DIAGNOSIS — F3160 Bipolar disorder, current episode mixed, unspecified: Secondary | ICD-10-CM

## 2015-03-19 DIAGNOSIS — K5669 Other intestinal obstruction: Secondary | ICD-10-CM | POA: Diagnosis not present

## 2015-03-19 MED ORDER — DIAZEPAM 10 MG PO TABS: 10.0000 mg | ORAL_TABLET | Freq: Three times a day (TID) | ORAL | Status: DC | PRN

## 2015-03-19 NOTE — Progress Notes (Signed)
Pre visit review using our clinic review tool, if applicable. No additional management support is needed unless otherwise documented below in the visit note. 

## 2015-03-19 NOTE — Progress Notes (Signed)
Subjective:    Patient ID: [name], female    DOB: 08/28/1965, 49 y.o.   MRN: [id number]  Chief Complaint  Patient presents with  . Hospitalization Follow-up    was seen for abdominal pain, had a bowel obstruction, feels better    HPI:  [name] is a 49 y.o. female who  has a past medical history of IUD; Hypertension; Anemia; History of blood transfusion; Anxiety; Bipolar disorder ([place]); Depression; and Seizures ([place]) [id number]). and presents today for a follow up office visit.   Recently evaluated in the emergency department and admitted to the hospital with abdominal pain. A CT scan showed possible small bowel obstruction. She did have a bowel movement and was passing flatulance. She was evaluated by surgery and determined that she may need a revision of her gastric bypass. She will be following up with Dr. Lucia Gaskins in the next couple of weeks. All hospital records were reviewed in detail.   Since leaving the hospital she is feeling better without complaints of abdominal pain. She is having bowl movements and is eating an adequately without problems.     Allergies  Allergen Reactions  . Lactulose Itching  . Ondansetron Nausea And Vomiting  . Quetiapine     Other reaction(s): Other (See Comments) Possible Hair Loss  . Sulfa Antibiotics Rash     Current Outpatient Prescriptions on File Prior to Visit  Medication Sig Dispense Refill  . amLODipine (NORVASC) 10 MG tablet Take 10 mg by mouth daily.    . carvedilol (COREG) 12.5 MG tablet Take 1 tablet (12.5 mg total) by mouth 2 (two) times daily with a meal. 60 tablet 5  . celecoxib (CELEBREX) 200 MG capsule Take 200 mg by mouth daily as needed for mild pain.     . diphenhydrAMINE (BENADRYL) 25 MG tablet Take 50 mg by mouth 2 (two) times daily as needed for allergies.    . furosemide (LASIX) 20 MG tablet Take 20 mg by mouth daily as needed for fluid.     Marland Kitchen lidocaine (LIDODERM) 5 % Place 1 patch onto the skin daily  as needed (for pain). Remove & Discard patch within 12 hours or as directed by MD    . tiZANidine (ZANAFLEX) 4 MG tablet Take 4 mg by mouth every 8 (eight) hours as needed for muscle spasms.    Marland Kitchen topiramate (TOPAMAX) 100 MG tablet TAKE (2) TABLETS BY MOUTH AT BEDTIME. 60 tablet 2  . traMADol (ULTRAM-ER) 200 MG 24 hr tablet Take 200 mg by mouth daily.    Marland Kitchen venlafaxine XR (EFFEXOR-XR) 150 MG 24 hr capsule TAKE ONE CAPSULE BY MOUTH TWICE DAILY. 60 capsule 5   No current facility-administered medications on file prior to visit.    Review of Systems  Constitutional: Negative for fever and chills.  Respiratory: Negative for chest tightness and shortness of breath.   Cardiovascular: Negative for chest pain, palpitations and leg swelling.  Gastrointestinal: Negative for nausea, vomiting, abdominal pain, diarrhea and constipation.      Objective:    BP 102/80 mmHg  Pulse 84  Temp(Src) 98 F (36.7 C) (Oral)  Resp 18  Ht 5\' 1"  (1.549 m)  Wt 190 lb (86.183 kg)  BMI 35.92 kg/m2  SpO2 97% Nursing note and vital signs reviewed.  Physical Exam  Constitutional: She is oriented to person, place, and time. She appears well-developed and well-nourished. No distress.  Cardiovascular: Normal rate, regular rhythm, normal heart sounds and intact distal pulses.  Pulmonary/Chest: Effort normal and breath sounds normal.  Abdominal: Soft. Bowel sounds are normal. She exhibits no distension and no mass. There is no tenderness. There is no rebound and no guarding.  Neurological: She is alert and oriented to person, place, and time.  Skin: Skin is warm and dry.  Psychiatric: She has a normal mood and affect. Her behavior is normal. Judgment and thought content normal.       Assessment & Plan:   Problem List Items Addressed This Visit      Digestive   Small bowel obstruction ([place]) - Primary    Abdominal pain has resolved since leaving the hospital with no residual effects. She is eating adequately and  having bowel movements at her baseline. No further follow up necessary for this problem. Will follow up with Dr. Milford Cage for revision of gastric bypass surgery.         Other   Bipolar disorder ([place])   Relevant Medications   diazepam (VALIUM) 10 MG tablet

## 2015-03-19 NOTE — Patient Instructions (Signed)
Thank you for choosing [facility].  Summary/Instructions:  Your prescription(s) have been submitted to your pharmacy or been printed and provided for you. Please take as directed and contact our office if you believe you are having problem(s) with the medication(s) or have any questions.  If your symptoms worsen or fail to improve, please contact our office for further instruction, or in case of emergency go directly to the emergency room at the closest medical facility.   Please continue to take your medications as prescribed.   Follow up with the kidney doctors and Dr. Milford Cage.

## 2015-03-19 NOTE — Assessment & Plan Note (Signed)
Abdominal pain has resolved since leaving the hospital with no residual effects. She is eating adequately and having bowel movements at her baseline. No further follow up necessary for this problem. Will follow up with Dr. Milford Cage for revision of gastric bypass surgery.

## 2015-03-20 ENCOUNTER — Other Ambulatory Visit: Payer: Self-pay | Admitting: Family

## 2015-04-10 ENCOUNTER — Telehealth: Payer: Self-pay | Admitting: *Deleted

## 2015-04-10 MED ORDER — FUROSEMIDE 20 MG PO TABS: 20.0000 mg | ORAL_TABLET | Freq: Every day | ORAL | Status: DC | PRN

## 2015-04-10 NOTE — Telephone Encounter (Signed)
Received call pt requesting refill on her Lasix. Verified pharmacy inform sending electronically...Johny Chess

## 2015-04-20 ENCOUNTER — Other Ambulatory Visit: Payer: Self-pay | Admitting: Family

## 2015-04-21 ENCOUNTER — Other Ambulatory Visit: Payer: Self-pay | Admitting: Family

## 2015-04-21 NOTE — Telephone Encounter (Signed)
Last refill was 12/29

## 2015-04-22 ENCOUNTER — Other Ambulatory Visit: Payer: Self-pay | Admitting: Family

## 2015-04-22 NOTE — Telephone Encounter (Signed)
Notified pt rx has been fax to [facility]...[name]

## 2015-04-22 NOTE — Telephone Encounter (Signed)
Received call requesting status on diazepam refill. She states she is out & needing med ASAP...Johny Chess

## 2015-05-04 ENCOUNTER — Telehealth: Payer: Self-pay | Admitting: Family

## 2015-05-04 MED ORDER — FUROSEMIDE 20 MG PO TABS: 20.0000 mg | ORAL_TABLET | Freq: Every day | ORAL | Status: DC | PRN

## 2015-05-04 MED ORDER — AMLODIPINE BESYLATE 10 MG PO TABS: 10.0000 mg | ORAL_TABLET | Freq: Every day | ORAL | Status: DC

## 2015-05-04 NOTE — Telephone Encounter (Signed)
Medications sent to pharmacy

## 2015-05-04 NOTE — Telephone Encounter (Signed)
Please advise 

## 2015-05-04 NOTE — Telephone Encounter (Signed)
Patient states another doctor prescribes amlodipine but that doctor will not prescribe this medication anymore for her.  She would like to know if Marya Amsler could refill this for her.  She is also requesting a refill on lasix.  Patient uses [facility].

## 2015-05-06 NOTE — Telephone Encounter (Signed)
Pt had left msg on triage 05/05/15 wanting to know if rx's was sent to pharmacy. Called pt back this morning she stated that she pick med up on yesterday...Johny Chess

## 2015-05-08 ENCOUNTER — Telehealth: Payer: Self-pay | Admitting: Family

## 2015-05-08 NOTE — Telephone Encounter (Signed)
Patient received phone call to make a medicare wellness.  Can you please call back in regards.  Was not sure how to schedule because of her age.

## 2015-05-14 DIAGNOSIS — N184 Chronic kidney disease, stage 4 (severe): Secondary | ICD-10-CM | POA: Diagnosis not present

## 2015-05-14 DIAGNOSIS — Z9884 Bariatric surgery status: Secondary | ICD-10-CM | POA: Diagnosis not present

## 2015-05-14 DIAGNOSIS — Z888 Allergy status to other drugs, medicaments and biological substances status: Secondary | ICD-10-CM | POA: Diagnosis not present

## 2015-05-14 DIAGNOSIS — Z6838 Body mass index (BMI) 38.0-38.9, adult: Secondary | ICD-10-CM | POA: Diagnosis not present

## 2015-05-14 DIAGNOSIS — I129 Hypertensive chronic kidney disease with stage 1 through stage 4 chronic kidney disease, or unspecified chronic kidney disease: Secondary | ICD-10-CM | POA: Diagnosis not present

## 2015-05-14 DIAGNOSIS — R809 Proteinuria, unspecified: Secondary | ICD-10-CM | POA: Insufficient documentation

## 2015-05-14 DIAGNOSIS — Z862 Personal history of diseases of the blood and blood-forming organs and certain disorders involving the immune mechanism: Secondary | ICD-10-CM | POA: Diagnosis not present

## 2015-05-14 DIAGNOSIS — Z882 Allergy status to sulfonamides status: Secondary | ICD-10-CM | POA: Diagnosis not present

## 2015-05-14 DIAGNOSIS — E669 Obesity, unspecified: Secondary | ICD-10-CM | POA: Diagnosis not present

## 2015-05-14 DIAGNOSIS — Z79899 Other long term (current) drug therapy: Secondary | ICD-10-CM | POA: Diagnosis not present

## 2015-05-20 ENCOUNTER — Telehealth: Payer: Self-pay | Admitting: Family

## 2015-05-20 ENCOUNTER — Telehealth: Payer: Self-pay | Admitting: *Deleted

## 2015-05-20 ENCOUNTER — Ambulatory Visit: Payer: Self-pay

## 2015-05-20 MED ORDER — TOPIRAMATE 100 MG PO TABS: ORAL_TABLET | ORAL | Status: DC

## 2015-05-20 MED ORDER — DIAZEPAM 10 MG PO TABS: ORAL_TABLET | ORAL | Status: DC

## 2015-05-20 NOTE — Telephone Encounter (Signed)
Rx printed and faxed.  

## 2015-05-20 NOTE — Telephone Encounter (Signed)
Pt requesting refills for topiramate (TOPAMAX) 100 MG tablet [id number] and diazepam (VALIUM) 10 MG tablet [id number]  Pharmacy is [facility]

## 2015-05-20 NOTE — Telephone Encounter (Signed)
Sent Topamax pls advise on Diazepam../lmb

## 2015-05-20 NOTE — Telephone Encounter (Signed)
REFILL REQUEST FOR  TIZANIDINE 4MG  ONE PO Q8 PRN #[phone]

## 2015-05-21 MED ORDER — TIZANIDINE HCL 4 MG PO TABS: ORAL_TABLET | ORAL | Status: DC

## 2015-05-21 NOTE — Telephone Encounter (Signed)
Rx printed

## 2015-06-18 ENCOUNTER — Other Ambulatory Visit: Payer: Self-pay | Admitting: Family

## 2015-06-18 DIAGNOSIS — Z01818 Encounter for other preprocedural examination: Secondary | ICD-10-CM | POA: Diagnosis not present

## 2015-06-19 NOTE — Telephone Encounter (Signed)
Received call pt checking status opn her Diazepam.../lmb

## 2015-06-19 NOTE — Telephone Encounter (Signed)
Notified pt rx has been faxed back to [facility]...[name]

## 2015-06-26 DIAGNOSIS — R809 Proteinuria, unspecified: Secondary | ICD-10-CM | POA: Diagnosis present

## 2015-06-26 DIAGNOSIS — F319 Bipolar disorder, unspecified: Secondary | ICD-10-CM | POA: Diagnosis present

## 2015-06-26 DIAGNOSIS — M793 Panniculitis, unspecified: Secondary | ICD-10-CM | POA: Diagnosis not present

## 2015-06-26 DIAGNOSIS — Z9884 Bariatric surgery status: Secondary | ICD-10-CM | POA: Diagnosis not present

## 2015-06-26 DIAGNOSIS — M170 Bilateral primary osteoarthritis of knee: Secondary | ICD-10-CM | POA: Diagnosis present

## 2015-06-26 DIAGNOSIS — I129 Hypertensive chronic kidney disease with stage 1 through stage 4 chronic kidney disease, or unspecified chronic kidney disease: Secondary | ICD-10-CM | POA: Diagnosis present

## 2015-06-26 DIAGNOSIS — E65 Localized adiposity: Secondary | ICD-10-CM | POA: Diagnosis present

## 2015-06-26 DIAGNOSIS — Z87891 Personal history of nicotine dependence: Secondary | ICD-10-CM | POA: Diagnosis not present

## 2015-06-26 DIAGNOSIS — K429 Umbilical hernia without obstruction or gangrene: Secondary | ICD-10-CM | POA: Diagnosis not present

## 2015-06-26 DIAGNOSIS — G8929 Other chronic pain: Secondary | ICD-10-CM | POA: Diagnosis present

## 2015-06-26 DIAGNOSIS — K432 Incisional hernia without obstruction or gangrene: Secondary | ICD-10-CM | POA: Diagnosis not present

## 2015-06-26 DIAGNOSIS — N184 Chronic kidney disease, stage 4 (severe): Secondary | ICD-10-CM | POA: Diagnosis present

## 2015-06-26 DIAGNOSIS — R000 Tachycardia, unspecified: Secondary | ICD-10-CM | POA: Diagnosis not present

## 2015-07-20 ENCOUNTER — Telehealth: Payer: Self-pay | Admitting: Orthopaedic Surgery

## 2015-07-20 ENCOUNTER — Ambulatory Visit (INDEPENDENT_AMBULATORY_CARE_PROVIDER_SITE_OTHER): Payer: Medicare Other | Admitting: Family

## 2015-07-20 ENCOUNTER — Encounter: Payer: Self-pay | Admitting: Family

## 2015-07-20 ENCOUNTER — Telehealth: Payer: Self-pay

## 2015-07-20 VITALS — BP 130/94 | HR 104 | Temp 97.7°F | Resp 16 | Ht 61.0 in | Wt 178.0 lb

## 2015-07-20 DIAGNOSIS — R600 Localized edema: Secondary | ICD-10-CM

## 2015-07-20 MED ORDER — TOPIRAMATE 100 MG PO TABS: ORAL_TABLET | ORAL | Status: DC

## 2015-07-20 MED ORDER — DIAZEPAM 10 MG PO TABS: ORAL_TABLET | ORAL | Status: DC

## 2015-07-20 MED ORDER — VENLAFAXINE HCL ER 150 MG PO CP24: 150.0000 mg | ORAL_CAPSULE | Freq: Two times a day (BID) | ORAL | Status: DC

## 2015-07-20 MED ORDER — FUROSEMIDE 20 MG PO TABS: 20.0000 mg | ORAL_TABLET | Freq: Every day | ORAL | Status: DC | PRN

## 2015-07-20 NOTE — Progress Notes (Signed)
Subjective:    Patient ID: [name], female    DOB: 13-Jan-1966, 50 y.o.   MRN: [id number]  Chief Complaint  Patient presents with  . Leg Pain    bilateral leg pain that happened last week, it started at the thighs and went down to feet, legs were swollen, pain is gone now    HPI:  [name] is a 50 y.o. female who  has a past medical history of IUD; Hypertension; Anemia; History of blood transfusion; Anxiety; Bipolar disorder ([place]); Depression; and Seizures ([place]) [id number]). and presents today for a follow up office visit.   Associated symptom of pain located in her bilateral legs started approximately 1 week ago and has improved since the initial onset. Continues to experience some edema located in her bilateral lower extremity. She had a panniculectomy and repair of surpaventral hernia. Pain was described as sharp and located in her bilateral thighs.  Allergies  Allergen Reactions  . Lactulose Itching  . Ondansetron Nausea And Vomiting  . Quetiapine     Other reaction(s): Other (See Comments) Possible Hair Loss  . Sulfa Antibiotics Rash     Current Outpatient Prescriptions on File Prior to Visit  Medication Sig Dispense Refill  . carvedilol (COREG) 12.5 MG tablet TAKE (1) TABLET BY MOUTH TWICE DAILY WITH A MEAL. 60 tablet 4  . celecoxib (CELEBREX) 200 MG capsule Take 200 mg by mouth daily as needed for mild pain.     . diphenhydrAMINE (BENADRYL) 25 MG tablet Take 50 mg by mouth 2 (two) times daily as needed for allergies.    Marland Kitchen lidocaine (LIDODERM) 5 % Place 1 patch onto the skin daily as needed (for pain). Remove & Discard patch within 12 hours or as directed by MD    . tiZANidine (ZANAFLEX) 4 MG tablet One by mouth every 8 hours as needed for spasm 90 tablet 3  . traMADol (ULTRAM-ER) 200 MG 24 hr tablet Take 200 mg by mouth daily.     No current facility-administered medications on file prior to visit.     Review of Systems  Constitutional: Negative for  fever and chills.  Respiratory: Negative for chest tightness and shortness of breath.   Cardiovascular: Negative for chest pain, palpitations and leg swelling.  Skin:       Positive for incision site.   Neurological: Negative for headaches.      Objective:    BP 130/94 mmHg  Pulse 104  Temp(Src) 97.7 F (36.5 C) (Oral)  Resp 16  Ht 5\' 1"  (1.549 m)  Wt 178 lb (80.74 kg)  BMI 33.65 kg/m2  SpO2 94% Nursing note and vital signs reviewed.  Physical Exam  Constitutional: She is oriented to person, place, and time. She appears well-developed and well-nourished. No distress.  Cardiovascular: Normal rate, regular rhythm, normal heart sounds and intact distal pulses.   Bilateral lower extremities with mild/moderate non-pitting edema.   Pulmonary/Chest: Effort normal and breath sounds normal.  Neurological: She is alert and oriented to person, place, and time.  Skin: Skin is warm and dry.  Incision site around her torso appears with sutures and no evidence of infection.   Psychiatric: She has a normal mood and affect. Her behavior is normal. Judgment and thought content normal.       Assessment & Plan:   Problem List Items Addressed This Visit      Other   Lower extremity edema - Primary    Bilateral lower extremity and edema most  likely related to recent panniculectomy which is continuing to improve. Incision sites are clean, dry and intact with JP drains in place and functioning correctly. Continue to elevate legs and continue furosemide as needed. Follow up with surgeon as scheduled.           I have discontinued Ms. Gotsch's amLODipine. I have also changed her venlafaxine XR. Additionally, I am having her maintain her traMADol, lidocaine, celecoxib, diphenhydrAMINE, carvedilol, tiZANidine, topiramate, diazepam, and furosemide.   Meds ordered this encounter  Medications  . topiramate (TOPAMAX) 100 MG tablet    Sig: TAKE (2) TABLETS BY MOUTH AT BEDTIME.    Dispense:  60  tablet    Refill:  3  . diazepam (VALIUM) 10 MG tablet    Sig: TAKE 1 TABLET BY MOUTH THREE TIMES DAILY AS NEEDED FOR ANXIETY.    Dispense:  90 tablet    Refill:  0  . furosemide (LASIX) 20 MG tablet    Sig: Take 1 tablet (20 mg total) by mouth daily as needed for fluid.    Dispense:  30 tablet    Refill:  1  . venlafaxine XR (EFFEXOR-XR) 150 MG 24 hr capsule    Sig: Take 1 capsule (150 mg total) by mouth 2 (two) times daily.    Dispense:  60 capsule    Refill:  3     Follow-up: Return if symptoms worsen or fail to improve.  Nicole Po, FNP

## 2015-07-20 NOTE — Assessment & Plan Note (Signed)
Bilateral lower extremity and edema most likely related to recent panniculectomy which is continuing to improve. Incision sites are clean, dry and intact with JP drains in place and functioning correctly. Continue to elevate legs and continue furosemide as needed. Follow up with surgeon as scheduled.

## 2015-07-20 NOTE — Patient Instructions (Addendum)
Thank you for choosing [facility].  Summary/Instructions:  Please continue to elevate your legs and take the furosemide as needed for edema.  Your prescription(s) have been submitted to your pharmacy or been printed and provided for you. Please take as directed and contact our office if you believe you are having problem(s) with the medication(s) or have any questions.  If your symptoms worsen or fail to improve, please contact our office for further instruction, or in case of emergency go directly to the emergency room at the closest medical facility.

## 2015-07-20 NOTE — Progress Notes (Signed)
Pre visit review using our clinic review tool, if applicable. No additional management support is needed unless otherwise documented below in the visit note. 

## 2015-07-21 ENCOUNTER — Telehealth: Payer: Self-pay | Admitting: Orthopaedic Surgery

## 2015-07-21 NOTE — Telephone Encounter (Signed)
Patient is requesting Tramadol ER 200mg  Qty 30 Tablets  Patient has not had a office visit since 11/05/14, does she need appointment first?

## 2015-07-21 NOTE — Telephone Encounter (Signed)
Rx Done . 

## 2015-07-22 NOTE — Telephone Encounter (Signed)
I did one of these yesterday.  She does need to be seen.

## 2015-08-03 DIAGNOSIS — Z114 Encounter for screening for human immunodeficiency virus [HIV]: Secondary | ICD-10-CM | POA: Diagnosis not present

## 2015-08-03 DIAGNOSIS — Z6835 Body mass index (BMI) 35.0-35.9, adult: Secondary | ICD-10-CM | POA: Diagnosis not present

## 2015-08-03 DIAGNOSIS — Z124 Encounter for screening for malignant neoplasm of cervix: Secondary | ICD-10-CM | POA: Diagnosis not present

## 2015-08-03 DIAGNOSIS — Z1159 Encounter for screening for other viral diseases: Secondary | ICD-10-CM | POA: Diagnosis not present

## 2015-08-03 DIAGNOSIS — Z113 Encounter for screening for infections with a predominantly sexual mode of transmission: Secondary | ICD-10-CM | POA: Diagnosis not present

## 2015-08-10 ENCOUNTER — Telehealth: Payer: Self-pay | Admitting: Orthopaedic Surgery

## 2015-08-10 MED ORDER — TIZANIDINE HCL 4 MG PO TABS: ORAL_TABLET | ORAL | Status: DC

## 2015-08-10 NOTE — Telephone Encounter (Signed)
Zanaflex 4 mg  Qty 90 Tablets  Patient states the pharmacy told her to call you for refill

## 2015-08-10 NOTE — Telephone Encounter (Signed)
Rx done. 

## 2015-08-19 ENCOUNTER — Telehealth: Payer: Self-pay | Admitting: *Deleted

## 2015-08-19 MED ORDER — DIAZEPAM 10 MG PO TABS: ORAL_TABLET | ORAL | Status: DC

## 2015-08-19 NOTE — Telephone Encounter (Signed)
Receive call pt is requesting refills on her diazepam.../lmb

## 2015-08-19 NOTE — Telephone Encounter (Signed)
Medication refilled

## 2015-08-19 NOTE — Telephone Encounter (Signed)
Notified pt rx fax to pharmacy.../lmb 

## 2015-08-20 ENCOUNTER — Encounter: Payer: Self-pay | Admitting: Orthopaedic Surgery

## 2015-08-20 ENCOUNTER — Ambulatory Visit (INDEPENDENT_AMBULATORY_CARE_PROVIDER_SITE_OTHER): Payer: Medicare Other

## 2015-08-20 ENCOUNTER — Ambulatory Visit (INDEPENDENT_AMBULATORY_CARE_PROVIDER_SITE_OTHER): Payer: Medicare Other | Admitting: Orthopaedic Surgery

## 2015-08-20 VITALS — BP 193/120 | HR 86 | Temp 97.7°F | Resp 16 | Ht 59.5 in | Wt 162.0 lb

## 2015-08-20 DIAGNOSIS — M25512 Pain in left shoulder: Secondary | ICD-10-CM | POA: Diagnosis not present

## 2015-08-20 DIAGNOSIS — I10 Essential (primary) hypertension: Secondary | ICD-10-CM

## 2015-08-20 NOTE — Patient Instructions (Addendum)
Precautions discussed.  MRI left shoulder.  Return after MRI to be scheduled at [facility].

## 2015-08-20 NOTE — Progress Notes (Signed)
Patient [name], female DOB:18-Feb-1966, 50 y.o. [id number]  Chief Complaint  Patient presents with  . Follow-up    Left shoulder     HPI  [name] is a 50 y.o. female who is having marked pain of the left shoulder getting worse over the last several months.  She has had rotator cuff surgery in the past and did well.  She has pain with almost any motion of the left shoulder now.  It hurts all the time.  Nothing seems to help.  Ice,heat, medicine does not help.  She has no new trauma.  She has lost considerable weight over the last 18 months.  She recently had plastic surgery on the abdomen to remove a pectunculus.  HPI  Body mass index is 32.19 kg/(m^2).  ROS  Review of Systems  HENT: Negative for congestion.   Respiratory: Negative for cough and shortness of breath.   Cardiovascular: Negative for chest pain and leg swelling.  Endocrine: Positive for cold intolerance.  Musculoskeletal: Positive for arthralgias.  Allergic/Immunologic: Positive for environmental allergies.    Past Medical History  Diagnosis Date  . IUD     HISTORY OF IUD --REMOVED IN 2006  . Hypertension   . Anemia   . History of blood transfusion   . Anxiety   . Bipolar disorder ([place])   . Depression   . Seizures ([place]) [id number]    two seizures due to a med changes    Past Surgical History  Procedure Laterality Date  . Rotator cuff repair    . Gastric bypass  2000  . Hysteroscopy w/d&c  11/14/2011    Procedure: DILATATION AND CURETTAGE /HYSTEROSCOPY;  Surgeon: Marylynn Pearson, MD;  Location: [facility];  Service: Gynecology;;  with removal of polyps  . Intrauterine device (iud) insertion  03/21/2010  . Carpal tunnel release Right 07/08/2014    Procedure: RIGHT CARPAL TUNNEL RELEASE;  Surgeon: Sanjuana Kava, MD;  Location: [facility];  Service: Orthopedics;  Laterality: Right;  . Cholecystectomy      Family History  Problem Relation Age of Onset  . Hypertension Mother   . Kidney failure  Mother   . Hypertension Father   . Diabetes Sister   . Hypertension Sister   . Hypertension Brother   . Hypertension Maternal Aunt   . Hypertension Maternal Uncle   . Hypertension Maternal Grandmother   . Hypertension Maternal Grandfather   . Hypertension Paternal Grandmother   . Hypertension Paternal Grandfather   . Diabetes Cousin     Social History Social History  Substance Use Topics  . Smoking status: Former Smoker -- 0.25 packs/day for 4 years    Types: Cigarettes    Quit date: 03/21/1988  . Smokeless tobacco: Never Used  . Alcohol Use: Yes     Comment: social    Allergies  Allergen Reactions  . Lactulose Itching  . Ondansetron Nausea And Vomiting  . Quetiapine     Other reaction(s): Other (See Comments) Possible Hair Loss  . Sulfa Antibiotics Rash    Current Outpatient Prescriptions  Medication Sig Dispense Refill  . carvedilol (COREG) 12.5 MG tablet TAKE (1) TABLET BY MOUTH TWICE DAILY WITH A MEAL. 60 tablet 4  . celecoxib (CELEBREX) 200 MG capsule Take 200 mg by mouth daily as needed for mild pain.     . diazepam (VALIUM) 10 MG tablet TAKE 1 TABLET BY MOUTH THREE TIMES DAILY AS NEEDED FOR ANXIETY. 90 tablet 1  . diphenhydrAMINE (BENADRYL) 25 MG tablet  Take 50 mg by mouth 2 (two) times daily as needed for allergies.    . furosemide (LASIX) 20 MG tablet Take 1 tablet (20 mg total) by mouth daily as needed for fluid. 30 tablet 1  . lidocaine (LIDODERM) 5 % Place 1 patch onto the skin daily as needed (for pain). Remove & Discard patch within 12 hours or as directed by MD    . tiZANidine (ZANAFLEX) 4 MG tablet One by mouth every 8 hours as needed for spasm 90 tablet 3  . topiramate (TOPAMAX) 100 MG tablet TAKE (2) TABLETS BY MOUTH AT BEDTIME. 60 tablet 3  . traMADol (ULTRAM-ER) 200 MG 24 hr tablet TAKE 1 TABLET DAILY FOR PAIN. 30 tablet 3  . venlafaxine XR (EFFEXOR-XR) 150 MG 24 hr capsule Take 1 capsule (150 mg total) by mouth 2 (two) times daily. 60 capsule 3    No current facility-administered medications for this visit.     Physical Exam  Blood pressure 193/120, pulse 86, temperature 97.7 F (36.5 C), resp. rate 16, height 4' 11.5" (1.511 m), weight 162 lb (73.483 kg).  Constitutional: overall normal hygiene, normal nutrition, well developed, normal grooming, normal body habitus. Assistive device:none  Musculoskeletal: gait and station Limp none, muscle tone and strength are normal, no tremors or atrophy is present.  .  Neurological: coordination overall normal.  Deep tendon reflex/nerve stretch intact.  Sensation normal.  Cranial nerves II-XII intact.   Skin:   Scars of both shoulders, abdomen, otherwise overall no scars, lesions, ulcers or rashes. No psoriasis.  Psychiatric: Alert and oriented x 3.  Recent memory intact, remote memory unclear.  Normal mood and affect. Well groomed.  Good eye contact.  Cardiovascular: overall no swelling, no varicosities, no edema bilaterally, normal temperatures of the legs and arms, no clubbing, cyanosis and good capillary refill.  Lymphatic: palpation is normal.  Examination of left Upper Extremity is done.  Inspection:   Overall:  Elbow non-tender without crepitus or defects, forearm non-tender without crepitus or defects, wrist non-tender without crepitus or defects, hand non-tender.    Shoulder: with glenohumeral joint tenderness, without effusion.   Upper arm: without swelling and tenderness   Range of motion:   Overall:  Full range of motion of the elbow, full range of motion of wrist and full range of motion in fingers.   Shoulder:  left  90 degrees forward flexion; 75 degrees abduction; 15 degrees internal rotation, 15 degrees external rotation, 5 degrees extension, 25 degrees adduction.   Stability:   Overall:  Shoulder, elbow and wrist stable   Strength and Tone:   Overall full shoulder muscles strength, full upper arm strength and normal upper arm bulk and tone.   X-rays were done  of the left shoulder, reported separately.  The patient has been educated about the nature of the problem(s) and counseled on treatment options.  The patient appeared to understand what I have discussed and is in agreement with it.  Encounter Diagnoses  Name Primary?  . Left shoulder pain Yes  . Essential hypertension, benign     PLAN Call if any problems.  Precautions discussed.  Continue current medications.   Return to clinic get MRI of the left shoulder.  She may need total shoulder.  Electronically Signed Sanjuana Kava, MD 6/1/201710:13 AM

## 2015-08-31 ENCOUNTER — Ambulatory Visit (HOSPITAL_COMMUNITY)
Admission: RE | Admit: 2015-08-31 | Discharge: 2015-08-31 | Disposition: A | Discharge status: Home / Self Care | Payer: Medicare Other | Source: Ambulatory Visit | Attending: Orthopaedic Surgery | Admitting: Orthopaedic Surgery

## 2015-08-31 DIAGNOSIS — M25512 Pain in left shoulder: Secondary | ICD-10-CM | POA: Diagnosis not present

## 2015-08-31 DIAGNOSIS — S46012A Strain of muscle(s) and tendon(s) of the rotator cuff of left shoulder, initial encounter: Secondary | ICD-10-CM | POA: Diagnosis not present

## 2015-08-31 DIAGNOSIS — X58XXXA Exposure to other specified factors, initial encounter: Secondary | ICD-10-CM | POA: Insufficient documentation

## 2015-08-31 DIAGNOSIS — S46812A Strain of other muscles, fascia and tendons at shoulder and upper arm level, left arm, initial encounter: Secondary | ICD-10-CM | POA: Insufficient documentation

## 2015-08-31 DIAGNOSIS — M19012 Primary osteoarthritis, left shoulder: Secondary | ICD-10-CM | POA: Diagnosis not present

## 2015-09-02 ENCOUNTER — Ambulatory Visit (INDEPENDENT_AMBULATORY_CARE_PROVIDER_SITE_OTHER): Payer: Medicare Other | Admitting: Orthopaedic Surgery

## 2015-09-02 ENCOUNTER — Ambulatory Visit (INDEPENDENT_AMBULATORY_CARE_PROVIDER_SITE_OTHER): Payer: Medicare Other

## 2015-09-02 ENCOUNTER — Encounter: Payer: Self-pay | Admitting: Orthopaedic Surgery

## 2015-09-02 VITALS — BP 136/85 | HR 68 | Temp 97.3°F | Ht 59.0 in | Wt 169.6 lb

## 2015-09-02 DIAGNOSIS — M25511 Pain in right shoulder: Secondary | ICD-10-CM

## 2015-09-02 DIAGNOSIS — I10 Essential (primary) hypertension: Secondary | ICD-10-CM | POA: Diagnosis not present

## 2015-09-02 DIAGNOSIS — M25512 Pain in left shoulder: Secondary | ICD-10-CM | POA: Diagnosis not present

## 2015-09-02 NOTE — Progress Notes (Signed)
Patient [name], female DOB:[birth year], 50 y.o. [id number]  Chief Complaint  Patient presents with  . Follow-up    Left shoulder  . Results    MRI    HPI  [name] is a 50 y.o. female who has bilateral shoulder pain.  She had a MRI of the left shoulder on 08-31-15 showing:  IMPRESSION: 1. Moderately severe osteoarthritis of the glenohumeral joint as described above. 2. Extensive rim rent partial-thickness articular surface tears of the distal infraspinatus and supraspinous tendons.  She needs to consider a total shoulder replacement on the left.  She will think about it.  She has pain of the right shoulder with more pain in overhead use. The right shoulder is not as painful as the left shoulder.  I will get a MRI of the right shoulder. HPI  Body mass index is 34.24 kg/(m^2).  ROS  Review of Systems  HENT: Negative for congestion.   Respiratory: Negative for cough and shortness of breath.   Cardiovascular: Negative for chest pain and leg swelling.  Endocrine: Positive for cold intolerance.  Musculoskeletal: Positive for arthralgias.  Allergic/Immunologic: Positive for environmental allergies.    Past Medical History  Diagnosis Date  . IUD     HISTORY OF IUD --REMOVED IN 2006  . Hypertension   . Anemia   . History of blood transfusion   . Anxiety   . Bipolar disorder ([place])   . Depression   . Seizures ([place]) [id number]    two seizures due to a med changes    Past Surgical History  Procedure Laterality Date  . Rotator cuff repair    . Gastric bypass  2000  . Hysteroscopy w/d&c  11/14/2011    Procedure: DILATATION AND CURETTAGE /HYSTEROSCOPY;  Surgeon: Marylynn Pearson, MD;  Location: [facility];  Service: Gynecology;;  with removal of polyps  . Intrauterine device (iud) insertion  03/21/2010  . Carpal tunnel release Right 07/08/2014    Procedure: RIGHT CARPAL TUNNEL RELEASE;  Surgeon: Sanjuana Kava, MD;  Location: [facility];  Service: Orthopedics;   Laterality: Right;  . Cholecystectomy      Family History  Problem Relation Age of Onset  . Hypertension Mother   . Kidney failure Mother   . Hypertension Father   . Diabetes Sister   . Hypertension Sister   . Hypertension Brother   . Hypertension Maternal Aunt   . Hypertension Maternal Uncle   . Hypertension Maternal Grandmother   . Hypertension Maternal Grandfather   . Hypertension Paternal Grandmother   . Hypertension Paternal Grandfather   . Diabetes Cousin     Social History Social History  Substance Use Topics  . Smoking status: Former Smoker -- 0.25 packs/day for 4 years    Types: Cigarettes    Quit date: 03/21/1988  . Smokeless tobacco: Never Used  . Alcohol Use: Yes     Comment: social    Allergies  Allergen Reactions  . Lactulose Itching  . Ondansetron Nausea And Vomiting  . Quetiapine     Other reaction(s): Other (See Comments) Possible Hair Loss  . Sulfa Antibiotics Rash    Current Outpatient Prescriptions  Medication Sig Dispense Refill  . carvedilol (COREG) 12.5 MG tablet TAKE (1) TABLET BY MOUTH TWICE DAILY WITH A MEAL. 60 tablet 4  . celecoxib (CELEBREX) 200 MG capsule Take 200 mg by mouth daily as needed for mild pain.     . diazepam (VALIUM) 10 MG tablet TAKE 1 TABLET BY MOUTH THREE TIMES DAILY  AS NEEDED FOR ANXIETY. 90 tablet 1  . diphenhydrAMINE (BENADRYL) 25 MG tablet Take 50 mg by mouth 2 (two) times daily as needed for allergies.    . furosemide (LASIX) 20 MG tablet Take 1 tablet (20 mg total) by mouth daily as needed for fluid. 30 tablet 1  . lidocaine (LIDODERM) 5 % Place 1 patch onto the skin daily as needed (for pain). Remove & Discard patch within 12 hours or as directed by MD    . tiZANidine (ZANAFLEX) 4 MG tablet One by mouth every 8 hours as needed for spasm 90 tablet 3  . topiramate (TOPAMAX) 100 MG tablet TAKE (2) TABLETS BY MOUTH AT BEDTIME. 60 tablet 3  . traMADol (ULTRAM-ER) 200 MG 24 hr tablet TAKE 1 TABLET DAILY FOR PAIN. 30  tablet 3  . venlafaxine XR (EFFEXOR-XR) 150 MG 24 hr capsule Take 1 capsule (150 mg total) by mouth 2 (two) times daily. 60 capsule 3   No current facility-administered medications for this visit.     Physical Exam  Blood pressure 136/85, pulse 68, temperature 97.3 F (36.3 C), height 4\' 11"  (1.499 m), weight 169 lb 9.6 oz (76.93 kg).  Constitutional: overall normal hygiene, normal nutrition, well developed, normal grooming, normal body habitus. Assistive device:none  Musculoskeletal: gait and station Limp none, muscle tone and strength are normal, no tremors or atrophy is present.  .  Neurological: coordination overall normal.  Deep tendon reflex/nerve stretch intact.  Sensation normal.  Cranial nerves II-XII intact.   Skin:   normal overall no scars, lesions, ulcers or rashes. No psoriasis.  Psychiatric: Alert and oriented x 3.  Recent memory intact, remote memory unclear.  Normal mood and affect. Well groomed.  Good eye contact.  Cardiovascular: overall no swelling, no varicosities, no edema bilaterally, normal temperatures of the legs and arms, no clubbing, cyanosis and good capillary refill.  Lymphatic: palpation is normal.  Examination of right Upper Extremity is done.  Inspection:   Overall:  Elbow non-tender without crepitus or defects, forearm non-tender without crepitus or defects, wrist non-tender without crepitus or defects, hand non-tender.    Shoulder: with glenohumeral joint tenderness, without effusion.   Upper arm: without swelling and tenderness   Range of motion:   Overall:  Full range of motion of the elbow, full range of motion of wrist and full range of motion in fingers.   Shoulder: right  180 degrees forward flexion; 165 degrees abduction; 35 degrees internal rotation, 35 degrees external rotation, 20 degrees extension, 40 degrees adduction.   Stability:   Overall:  Shoulder, elbow and wrist stable   Strength and Tone:   Overall full shoulder muscles  strength, full upper arm strength and normal upper arm bulk and tone.   The patient has been educated about the nature of the problem(s) and counseled on treatment options.  The patient appeared to understand what I have discussed and is in agreement with it.  Encounter Diagnoses  Name Primary?  . Right shoulder pain Yes  . Left shoulder pain   . Essential hypertension, benign     PLAN Call if any problems.  Precautions discussed.  Continue current medications.   Return to clinic after MRI of the right shoulder.

## 2015-09-02 NOTE — Patient Instructions (Signed)
Return after MRI Right shoulder.

## 2015-09-04 ENCOUNTER — Ambulatory Visit (HOSPITAL_COMMUNITY)
Admission: RE | Admit: 2015-09-04 | Discharge: 2015-09-04 | Disposition: A | Discharge status: Home / Self Care | Payer: Medicare Other | Source: Ambulatory Visit | Attending: Orthopaedic Surgery | Admitting: Orthopaedic Surgery

## 2015-09-04 DIAGNOSIS — M25511 Pain in right shoulder: Secondary | ICD-10-CM | POA: Insufficient documentation

## 2015-09-04 DIAGNOSIS — M75101 Unspecified rotator cuff tear or rupture of right shoulder, not specified as traumatic: Secondary | ICD-10-CM | POA: Diagnosis not present

## 2015-09-08 ENCOUNTER — Ambulatory Visit (INDEPENDENT_AMBULATORY_CARE_PROVIDER_SITE_OTHER): Payer: Medicare Other | Admitting: Orthopaedic Surgery

## 2015-09-08 ENCOUNTER — Encounter: Payer: Self-pay | Admitting: Orthopaedic Surgery

## 2015-09-08 VITALS — BP 137/92 | HR 70 | Ht 59.0 in | Wt 176.0 lb

## 2015-09-08 DIAGNOSIS — M25512 Pain in left shoulder: Secondary | ICD-10-CM | POA: Diagnosis not present

## 2015-09-08 DIAGNOSIS — I10 Essential (primary) hypertension: Secondary | ICD-10-CM | POA: Diagnosis not present

## 2015-09-08 DIAGNOSIS — M25511 Pain in right shoulder: Secondary | ICD-10-CM | POA: Diagnosis not present

## 2015-09-08 NOTE — Progress Notes (Signed)
Patient [id number] [name], female DOB:1965-07-14, 50 y.o. [id number]  Chief Complaint  Patient presents with  . Shoulder Pain    Right shoulder MRI results    HPI  [name] is a 50 y.o. female who has bilateral shoulder pain.  She had a MRI of the right shoulder 09-04-15.  It showed:  IMPRESSION: 1. The repaired rotator cuff is intact. 2. Repaired labrum is intact. 3. No acute abnormalities.  I have explained the findings.  She has severe degenerative disease changes of the left shoulder and is a candidate for a total shoulder.  She wants to go to [facility] evaluation.  I will arrange. HPI  Body mass index is 35.53 kg/(m^2).  ROS  Review of Systems  HENT: Negative for congestion.   Respiratory: Negative for cough and shortness of breath.   Cardiovascular: Negative for chest pain and leg swelling.  Endocrine: Positive for cold intolerance.  Musculoskeletal: Positive for arthralgias.  Allergic/Immunologic: Positive for environmental allergies.    Past Medical History  Diagnosis Date  . IUD     HISTORY OF IUD --REMOVED IN 2006  . Hypertension   . Anemia   . History of blood transfusion   . Anxiety   . Bipolar disorder ([place])   . Depression   . Seizures ([place]) [id number]    two seizures due to a med changes    Past Surgical History  Procedure Laterality Date  . Rotator cuff repair    . Gastric bypass  2000  . Hysteroscopy w/d&c  11/14/2011    Procedure: DILATATION AND CURETTAGE /HYSTEROSCOPY;  Surgeon: Marylynn Pearson, MD;  Location: [facility];  Service: Gynecology;;  with removal of polyps  . Intrauterine device (iud) insertion  03/21/2010  . Carpal tunnel release Right 07/08/2014    Procedure: RIGHT CARPAL TUNNEL RELEASE;  Surgeon: Sanjuana Kava, MD;  Location: [facility];  Service: Orthopedics;  Laterality: Right;  . Cholecystectomy      Family History  Problem Relation Age of Onset  . Hypertension Mother   . Kidney failure Mother   .  Hypertension Father   . Diabetes Sister   . Hypertension Sister   . Hypertension Brother   . Hypertension Maternal Aunt   . Hypertension Maternal Uncle   . Hypertension Maternal Grandmother   . Hypertension Maternal Grandfather   . Hypertension Paternal Grandmother   . Hypertension Paternal Grandfather   . Diabetes Cousin     Social History Social History  Substance Use Topics  . Smoking status: Former Smoker -- 0.25 packs/day for 4 years    Types: Cigarettes    Quit date: 03/21/1988  . Smokeless tobacco: Never Used  . Alcohol Use: Yes     Comment: social    Allergies  Allergen Reactions  . Lactulose Itching  . Ondansetron Nausea And Vomiting  . Quetiapine     Other reaction(s): Other (See Comments) Possible Hair Loss  . Sulfa Antibiotics Rash    Current Outpatient Prescriptions  Medication Sig Dispense Refill  . carvedilol (COREG) 12.5 MG tablet TAKE (1) TABLET BY MOUTH TWICE DAILY WITH A MEAL. 60 tablet 4  . celecoxib (CELEBREX) 200 MG capsule Take 200 mg by mouth daily as needed for mild pain.     . diazepam (VALIUM) 10 MG tablet TAKE 1 TABLET BY MOUTH THREE TIMES DAILY AS NEEDED FOR ANXIETY. 90 tablet 1  . diphenhydrAMINE (BENADRYL) 25 MG tablet Take 50 mg by mouth 2 (two) times daily as needed for  allergies.    . furosemide (LASIX) 20 MG tablet Take 1 tablet (20 mg total) by mouth daily as needed for fluid. 30 tablet 1  . lidocaine (LIDODERM) 5 % Place 1 patch onto the skin daily as needed (for pain). Remove & Discard patch within 12 hours or as directed by MD    . tiZANidine (ZANAFLEX) 4 MG tablet One by mouth every 8 hours as needed for spasm 90 tablet 3  . topiramate (TOPAMAX) 100 MG tablet TAKE (2) TABLETS BY MOUTH AT BEDTIME. 60 tablet 3  . traMADol (ULTRAM-ER) 200 MG 24 hr tablet TAKE 1 TABLET DAILY FOR PAIN. 30 tablet 3  . venlafaxine XR (EFFEXOR-XR) 150 MG 24 hr capsule Take 1 capsule (150 mg total) by mouth 2 (two) times daily. 60 capsule 3   No current  facility-administered medications for this visit.     Physical Exam  Blood pressure 137/92, pulse 70, height 4\' 11"  (1.499 m), weight 176 lb (79.833 kg).  Constitutional: overall normal hygiene, normal nutrition, well developed, normal grooming, normal body habitus. Assistive device:none  Musculoskeletal: gait and station Limp none, muscle tone and strength are normal, no tremors or atrophy is present.  .  Neurological: coordination overall normal.  Deep tendon reflex/nerve stretch intact.  Sensation normal.  Cranial nerves II-XII intact.   Skin:   normal overall no scars, lesions, ulcers or rashes. No psoriasis.  Psychiatric: Alert and oriented x 3.  Recent memory intact, remote memory unclear.  Normal mood and affect. Well groomed.  Good eye contact.  Cardiovascular: overall no swelling, no varicosities, no edema bilaterally, normal temperatures of the legs and arms, no clubbing, cyanosis and good capillary refill.  Lymphatic: palpation is normal.  Examination of right Upper Extremity is done.  Inspection:   Overall:  Elbow non-tender without crepitus or defects, forearm non-tender without crepitus or defects, wrist non-tender without crepitus or defects, hand non-tender.    Shoulder: with glenohumeral joint tenderness, without effusion.   Upper arm: without swelling and tenderness   Range of motion:   Overall:  Full range of motion of the elbow, full range of motion of wrist and full range of motion in fingers.   Shoulder:  right  165 degrees forward flexion; 145 degrees abduction; 35 degrees internal rotation, 35 degrees external rotation, 20 degrees extension, 40 degrees adduction.   Stability:   Overall:  Shoulder, elbow and wrist stable   Strength and Tone:   Overall full shoulder muscles strength, full upper arm strength and normal upper arm bulk and tone.   The patient has been educated about the nature of the problem(s) and counseled on treatment options.  The  patient appeared to understand what I have discussed and is in agreement with it.  Encounter Diagnoses  Name Primary?  . Pain in joint of left shoulder Yes  . Right shoulder pain   . Left shoulder pain   . Essential hypertension, benign     PLAN Call if any problems.  Precautions discussed.  Continue current medications.   Return to clinic to [facility] for evaluation of left shoulder for total shoulder.  Electronically Signed Sanjuana Kava, MD 6/20/201710:12 AM

## 2015-09-14 ENCOUNTER — Telehealth: Payer: Self-pay | Admitting: Orthopaedic Surgery

## 2015-09-14 NOTE — Telephone Encounter (Signed)
Patient called to give [place] the Dr's name that she will be seeing. his name is Dr. Berenice Primas and he is at the [facility]  on [address] in [place]. She said that he would need the xrays and MRI. Her appointment is scheduled for 10/15/15.

## 2015-09-18 ENCOUNTER — Telehealth: Payer: Self-pay | Admitting: *Deleted

## 2015-09-18 MED ORDER — FUROSEMIDE 20 MG PO TABS: 20.0000 mg | ORAL_TABLET | Freq: Every day | ORAL | Status: DC | PRN

## 2015-09-18 NOTE — Telephone Encounter (Signed)
Received fax pt needing refill on her furosemide...Johny Chess

## 2015-10-08 DIAGNOSIS — N760 Acute vaginitis: Secondary | ICD-10-CM | POA: Diagnosis not present

## 2015-10-08 DIAGNOSIS — Z113 Encounter for screening for infections with a predominantly sexual mode of transmission: Secondary | ICD-10-CM | POA: Diagnosis not present

## 2015-10-19 ENCOUNTER — Telehealth: Payer: Self-pay | Admitting: Orthopaedic Surgery

## 2015-10-19 ENCOUNTER — Other Ambulatory Visit: Payer: Self-pay | Admitting: Family

## 2015-10-19 NOTE — Telephone Encounter (Signed)
Last refill was diazepam was 08/19/15

## 2015-10-20 ENCOUNTER — Telehealth: Payer: Self-pay | Admitting: Orthopaedic Surgery

## 2015-11-19 ENCOUNTER — Telehealth: Payer: Self-pay | Admitting: Orthopaedic Surgery

## 2015-11-19 MED ORDER — TRAMADOL HCL ER 200 MG PO TB24: 200.0000 mg | ORAL_TABLET | Freq: Every day | ORAL | 3 refills | Status: DC

## 2015-11-19 NOTE — Telephone Encounter (Signed)
Call from patient + fax received from [facility] for refill: Tramadol ER 200 mg tab, 30, 1 tablet by mouth daily.  Patient's visit 09/08/15 indicates she has been referred to [facility] for management of shoulder.  Please advise.

## 2015-11-20 ENCOUNTER — Telehealth: Payer: Self-pay | Admitting: *Deleted

## 2015-11-20 MED ORDER — DIAZEPAM 10 MG PO TABS: 10.0000 mg | ORAL_TABLET | Freq: Three times a day (TID) | ORAL | 1 refills | Status: DC | PRN

## 2015-11-20 NOTE — Telephone Encounter (Signed)
Rec'd call pt requesting refill on her diazepam.../lmb

## 2015-11-20 NOTE — Telephone Encounter (Signed)
Medication refilled

## 2015-11-20 NOTE — Telephone Encounter (Signed)
Notified pt rx fax to pharmacy.../lmb 

## 2015-11-24 ENCOUNTER — Telehealth: Payer: Self-pay | Admitting: Orthopaedic Surgery

## 2015-11-24 NOTE — Telephone Encounter (Signed)
Faxed request received from [facility] for refill of Tramadol ER 200MG  Tablet, 30 tablet quantity

## 2015-12-18 ENCOUNTER — Other Ambulatory Visit: Payer: Self-pay | Admitting: Family

## 2015-12-18 ENCOUNTER — Other Ambulatory Visit: Payer: Self-pay | Admitting: Orthopaedic Surgery

## 2015-12-21 ENCOUNTER — Other Ambulatory Visit: Payer: Self-pay

## 2015-12-22 ENCOUNTER — Other Ambulatory Visit: Payer: Self-pay | Admitting: Orthopaedic Surgery

## 2015-12-22 ENCOUNTER — Other Ambulatory Visit: Payer: Self-pay | Admitting: *Deleted

## 2015-12-22 MED ORDER — TIZANIDINE HCL 4 MG PO TABS: ORAL_TABLET | ORAL | 0 refills | Status: DC

## 2016-01-01 ENCOUNTER — Emergency Department (HOSPITAL_COMMUNITY)
Admission: EM | Admit: 2016-01-01 | Discharge: 2016-01-01 | Disposition: A | Discharge status: Home / Self Care | Payer: Medicare Other | Attending: Emergency Medicine | Admitting: Emergency Medicine

## 2016-01-01 ENCOUNTER — Encounter (HOSPITAL_COMMUNITY): Payer: Self-pay | Admitting: Emergency Medicine

## 2016-01-01 DIAGNOSIS — I10 Essential (primary) hypertension: Secondary | ICD-10-CM | POA: Diagnosis not present

## 2016-01-01 DIAGNOSIS — Z79899 Other long term (current) drug therapy: Secondary | ICD-10-CM | POA: Diagnosis not present

## 2016-01-01 DIAGNOSIS — Z87891 Personal history of nicotine dependence: Secondary | ICD-10-CM | POA: Diagnosis not present

## 2016-01-01 DIAGNOSIS — A6004 Herpesviral vulvovaginitis: Secondary | ICD-10-CM | POA: Insufficient documentation

## 2016-01-01 DIAGNOSIS — L298 Other pruritus: Secondary | ICD-10-CM | POA: Diagnosis present

## 2016-01-01 DIAGNOSIS — B0089 Other herpesviral infection: Secondary | ICD-10-CM | POA: Diagnosis not present

## 2016-01-01 DIAGNOSIS — B9689 Other specified bacterial agents as the cause of diseases classified elsewhere: Secondary | ICD-10-CM

## 2016-01-01 DIAGNOSIS — N760 Acute vaginitis: Secondary | ICD-10-CM | POA: Diagnosis not present

## 2016-01-01 DIAGNOSIS — R1030 Lower abdominal pain, unspecified: Secondary | ICD-10-CM | POA: Diagnosis not present

## 2016-01-01 LAB — WET PREP, GENITAL
Sperm: NONE SEEN
Trich, Wet Prep: NONE SEEN
Yeast Wet Prep HPF POC: NONE SEEN

## 2016-01-01 MED ORDER — DIBUCAINE 1 % EX OINT: TOPICAL_OINTMENT | Freq: Three times a day (TID) | CUTANEOUS | 0 refills | Status: DC | PRN

## 2016-01-01 MED ORDER — ACYCLOVIR 400 MG PO TABS: 400.0000 mg | ORAL_TABLET | Freq: Three times a day (TID) | ORAL | 0 refills | Status: DC

## 2016-01-01 MED ORDER — IBUPROFEN 600 MG PO TABS
600.0000 mg | ORAL_TABLET | Freq: Three times a day (TID) | ORAL | 0 refills | Status: DC | PRN
Start: 2016-01-01 — End: 2016-03-01

## 2016-01-01 MED ORDER — METRONIDAZOLE 500 MG PO TABS: 500.0000 mg | ORAL_TABLET | Freq: Two times a day (BID) | ORAL | 0 refills | Status: DC

## 2016-01-01 NOTE — ED Provider Notes (Signed)
[facility] DEPT Provider Note   CSN: [id number] Arrival date & time: 01/01/16  1316     History   Chief Complaint Chief Complaint  Patient presents with  . Vaginal Itching    HPI [name] is a 50 y.o. female.  HPI   Patient presents with abnormal vaginal itching, pain, and rash, with associated mild lower abdominal tenderness that began 6 days ago.  Has placed eczema cream inside her vagina and has sprayed with hydrogen peroxide without improvement.  Denies fevers, chills, N/V, urinary or bowel symptoms.  Denies vaginal bleeding.  Has IUD in place.  Denies new sexual partners or concerns for STDs.   Has been told she had genital herpes in the past but has not been treated.  Pt states she has not been sexually active in 1 year.    Past Medical History:  Diagnosis Date  . Anemia   . Anxiety   . Bipolar disorder ([place])   . Depression   . History of blood transfusion   . Hypertension   . IUD    HISTORY OF IUD --REMOVED IN 2006  . Seizures ([place]) [id number]   two seizures due to a med changes    Patient Active Problem List   Diagnosis Date Noted  . Lower extremity edema 07/20/2015  . Small bowel obstruction 03/07/2015  . Gastroenteritis 03/07/2015  . Decreased GFR 01/21/2015  . H/O gastric bypass 01/21/2015  . Generalized headaches 01/05/2015  . Dizziness 01/05/2015  . Cough 11/18/2014  . Weight gain following gastric bypass surgery 08/13/2014  . Deficiency anemia 01/06/2014  . Iron deficiency anemia 01/06/2014  . Vitamin B12 deficiency (dietary) anemia 01/06/2014  . Intractable nausea and vomiting 01/25/2013  . Essential hypertension, benign 01/25/2013  . Hypokalemia 01/25/2013  . AKI (acute kidney injury) ([place]) 01/25/2013  . Abdominal pain 01/25/2013  . Bipolar disorder ([place])   . IUD     Past Surgical History:  Procedure Laterality Date  . CARPAL TUNNEL RELEASE Right 07/08/2014   Procedure: RIGHT CARPAL TUNNEL RELEASE;  Surgeon: Sanjuana Kava, MD;   Location: [facility];  Service: Orthopedics;  Laterality: Right;  . CHOLECYSTECTOMY    . GASTRIC BYPASS  2000  . HYSTEROSCOPY W/D&C  11/14/2011   Procedure: DILATATION AND CURETTAGE /HYSTEROSCOPY;  Surgeon: Marylynn Pearson, MD;  Location: [facility];  Service: Gynecology;;  with removal of polyps  . INTRAUTERINE DEVICE (IUD) INSERTION  03/21/2010  . ROTATOR CUFF REPAIR      OB History    Gravida Para Term Preterm AB Living   1       1 0   SAB TAB Ectopic Multiple Live Births   1               Home Medications    Prior to Admission medications   Medication Sig Start Date End Date Taking? Authorizing Provider  acyclovir (ZOVIRAX) 400 MG tablet Take 1 tablet (400 mg total) by mouth 3 (three) times daily. 01/01/16   Clayton Bibles, PA-C  carvedilol (COREG) 12.5 MG tablet TAKE (1) TABLET BY MOUTH TWICE DAILY WITH A MEAL. 04/21/15   [name], FNP  celecoxib (CELEBREX) 200 MG capsule Take 200 mg by mouth daily as needed for mild pain.     Historical Provider, MD  diazepam (VALIUM) 10 MG tablet Take 1 tablet (10 mg total) by mouth 3 (three) times daily as needed. for anxiety 11/20/15   [name], FNP  dibucaine (NUPERCAINAL) 1 % ointment Apply topically  3 (three) times daily as needed for pain. 01/01/16   Clayton Bibles, PA-C  diphenhydrAMINE (BENADRYL) 25 MG tablet Take 50 mg by mouth 2 (two) times daily as needed for allergies.    Historical Provider, MD  furosemide (LASIX) 20 MG tablet Take 1 tablet (20 mg total) by mouth daily as needed for fluid. 09/18/15   [name], FNP  ibuprofen (ADVIL,MOTRIN) 600 MG tablet Take 1 tablet (600 mg total) by mouth every 8 (eight) hours as needed. 01/01/16   Clayton Bibles, PA-C  lidocaine (LIDODERM) 5 % Place 1 patch onto the skin daily as needed (for pain). Remove & Discard patch within 12 hours or as directed by MD    Historical Provider, MD  metroNIDAZOLE (FLAGYL) 500 MG tablet Take 1 tablet (500 mg total) by mouth 2 (two) times daily. One po bid x 7 days  01/01/16   Clayton Bibles, PA-C  tiZANidine (ZANAFLEX) 4 MG tablet Take 1 tablet (4 mg total) by mouth at bedtime. TAKE 1 TABLET BY MOUTH EVERY 8 HOURS AS NEEDED FOR SPASM(S). 12/27/15   Sanjuana Kava, MD  topiramate (TOPAMAX) 100 MG tablet TAKE (2) TABLETS BY MOUTH AT BEDTIME. 12/21/15   [name], FNP  traMADol (ULTRAM-ER) 200 MG 24 hr tablet TAKE ONE TABLET BY MOUTH DAILY FOR PAIN. 11/24/15   Sanjuana Kava, MD  TraMADol HCl 200 MG CP24 TAKE ONE TABLET BY MOUTH DAILY FOR PAIN. 10/20/15   Sanjuana Kava, MD  venlafaxine XR (EFFEXOR-XR) 150 MG 24 hr capsule TAKE ONE CAPSULE BY MOUTH TWICE DAILY. 12/21/15   [name], FNP    Family History Family History  Problem Relation Age of Onset  . Hypertension Mother   . Kidney failure Mother   . Hypertension Father   . Diabetes Cousin   . Diabetes Sister   . Hypertension Sister   . Hypertension Brother   . Hypertension Maternal Aunt   . Hypertension Maternal Uncle   . Hypertension Maternal Grandmother   . Hypertension Maternal Grandfather   . Hypertension Paternal Grandmother   . Hypertension Paternal Grandfather     Social History Social History  Substance Use Topics  . Smoking status: Former Smoker    Packs/day: 0.25    Years: 4.00    Types: Cigarettes    Quit date: 03/21/1988  . Smokeless tobacco: Never Used  . Alcohol use Yes     Comment: social     Allergies   Lactulose; Ondansetron; Quetiapine; and Sulfa antibiotics   Review of Systems Review of Systems  Constitutional: Negative for chills and fever.  Gastrointestinal: Positive for abdominal pain. Negative for blood in stool, constipation, diarrhea, nausea and vomiting.  Genitourinary: Positive for vaginal pain. Negative for difficulty urinating, dysuria, frequency, menstrual problem, urgency and vaginal bleeding.  Skin: Positive for rash.     Physical Exam Updated Vital Signs BP 146/89 (BP Location: Left Arm)   Pulse 98   Temp 98.6 F (37 C) (Oral)   Resp 18    Ht 4\' 11"  (1.499 m)   Wt 72.6 kg   SpO2 100%   BMI 32.32 kg/m   Physical Exam  Constitutional: She appears well-developed and well-nourished. No distress.  HENT:  Head: Normocephalic and atraumatic.  Neck: Neck supple.  Cardiovascular: Normal rate and regular rhythm.   Pulmonary/Chest: Effort normal and breath sounds normal. No respiratory distress. She has no wheezes. She has no rales.  Abdominal: Soft. She exhibits no distension. There is tenderness (mild diffuse tenderness ).  There is no rebound and no guarding.  Genitourinary:  Genitourinary Comments: Thin yellow discharge within vagina.  Vagina tender to examination.  No pelvic/abdominal tenderness on bimanual exam.  External genitalia with 3 distinct ulcers over clitoris.    Neurological: She is alert.  Skin: She is not diaphoretic.  Nursing note and vitals reviewed.    ED Treatments / Results  Labs (all labs ordered are listed, but only abnormal results are displayed) Labs Reviewed  WET PREP, GENITAL - Abnormal; Notable for the following:       Result Value   Clue Cells Wet Prep HPF POC PRESENT (*)    WBC, Wet Prep HPF POC MODERATE (*)    All other components within normal limits  RPR  HIV ANTIBODY (ROUTINE TESTING)  GC/CHLAMYDIA PROBE AMP ([place]) NOT AT [facility]    EKG  EKG Interpretation None       Radiology No results found.  Procedures Procedures (including critical care time)  Medications Ordered in ED Medications - No data to display   Initial Impression / Assessment and Plan / ED Course  I have reviewed the triage vital signs and the nursing notes.  Pertinent labs & imaging results that were available during my care of the patient were reviewed by me and considered in my medical decision making (see chart for details).  Clinical Course    Afebrile, nontoxic patient with vaginal discomfort and vaginal discharge. No sexual activity in 1 year.  Doubt PID, torsion, TOA.  Doubt other acute  intraabdominal pathology at this time.  Wet prep demonstrates BV.  Clinically pt with herpes simplex on clitoris.   D/C home with acyclovir, flagyl, motrin, topical pain reliever.  PCP or GYN follow up.  Discussed result, findings, treatment, and follow up  with patient.  Pt given return precautions.  Pt verbalizes understanding and agrees with plan.       Final Clinical Impressions(s) / ED Diagnoses   Final diagnoses:  Herpes simplex vulvovaginitis  Bacterial vaginitis    New Prescriptions Discharge Medication List as of 01/01/2016  3:14 PM    START taking these medications   Details  acyclovir (ZOVIRAX) 400 MG tablet Take 1 tablet (400 mg total) by mouth 3 (three) times daily., Starting Fri 01/01/2016, Print    dibucaine (NUPERCAINAL) 1 % ointment Apply topically 3 (three) times daily as needed for pain., Starting Fri 01/01/2016, Print    ibuprofen (ADVIL,MOTRIN) 600 MG tablet Take 1 tablet (600 mg total) by mouth every 8 (eight) hours as needed., Starting Fri 01/01/2016, Print    metroNIDAZOLE (FLAGYL) 500 MG tablet Take 1 tablet (500 mg total) by mouth 2 (two) times daily. One po bid x 7 days, Starting Fri 01/01/2016, Print         [place], [place] 01/01/16 [place], MD 01/02/16 [phone]

## 2016-01-01 NOTE — Discharge Instructions (Signed)
Read the information below.  Use the prescribed medication as directed.  Please discuss all new medications with your pharmacist.  You may return to the Emergency Department at any time for worsening condition or any new symptoms that concern you.     If you develop high fevers, worsening abdominal or vaginal pain, uncontrolled vomiting, or are unable to tolerate fluids by mouth, return to the ER for a recheck.

## 2016-01-01 NOTE — ED Triage Notes (Signed)
Pt reports vaginal itching and burning that started on Sunday.

## 2016-01-02 LAB — HIV ANTIBODY (ROUTINE TESTING W REFLEX): HIV Screen 4th Generation wRfx: NONREACTIVE

## 2016-01-02 LAB — RPR: RPR Ser Ql: NONREACTIVE

## 2016-01-04 LAB — GC/CHLAMYDIA PROBE AMP (~~LOC~~) NOT AT ARMC
Chlamydia: NEGATIVE
Neisseria Gonorrhea: NEGATIVE

## 2016-01-05 ENCOUNTER — Telehealth: Payer: Self-pay | Admitting: *Deleted

## 2016-01-05 NOTE — Telephone Encounter (Signed)
Patient is calling about refills on her medications that she got at the hospital- she will run out before her appointment. 11:28 Call to patient- reviewed her hospital visit and her medication ( acyclovir and ibuprofen) discussed use and that she should be fine when she finishes her therapy. Her areas of concern are already healing and going away. Discussed suppression vs episodic therapy. She will think about that and discuss with her provider when she comes for her appointment.

## 2016-01-20 ENCOUNTER — Telehealth: Payer: Self-pay | Admitting: Orthopaedic Surgery

## 2016-01-20 ENCOUNTER — Other Ambulatory Visit: Payer: Self-pay | Admitting: Family

## 2016-01-20 MED ORDER — TIZANIDINE HCL 4 MG PO TABS: 4.0000 mg | ORAL_TABLET | Freq: Every day | ORAL | 0 refills | Status: DC

## 2016-01-20 MED ORDER — TRAMADOL HCL ER 200 MG PO TB24: ORAL_TABLET | ORAL | 2 refills | Status: DC

## 2016-01-20 NOTE — Telephone Encounter (Signed)
Patient requests refill on Zanaflex 4 mgs.   Qty  30        Sig: Take 1 tablet (4 mg total) by mouth at bedtime. TAKE 1 TABLET BY MOUTH EVERY 8 HOURS AS NEEDED FOR SPASM(S).

## 2016-01-20 NOTE — Telephone Encounter (Signed)
Patient requests a refill on Tramadol(Ultram ER)  200 mgs.   2 Refills    Qty   30    Sig: TAKE ONE TABLET BY MOUTH DAILY FOR PAIN.

## 2016-01-21 ENCOUNTER — Ambulatory Visit: Payer: Medicare Other | Admitting: Obstetrics and Gynecology

## 2016-01-25 NOTE — Telephone Encounter (Signed)
error 

## 2016-02-17 ENCOUNTER — Other Ambulatory Visit (INDEPENDENT_AMBULATORY_CARE_PROVIDER_SITE_OTHER): Payer: Medicare Other

## 2016-02-17 ENCOUNTER — Ambulatory Visit (INDEPENDENT_AMBULATORY_CARE_PROVIDER_SITE_OTHER): Payer: Medicare Other | Admitting: Family

## 2016-02-17 ENCOUNTER — Encounter: Payer: Self-pay | Admitting: Family

## 2016-02-17 DIAGNOSIS — Z23 Encounter for immunization: Secondary | ICD-10-CM

## 2016-02-17 DIAGNOSIS — D508 Other iron deficiency anemias: Secondary | ICD-10-CM | POA: Diagnosis not present

## 2016-02-17 DIAGNOSIS — F3160 Bipolar disorder, current episode mixed, unspecified: Secondary | ICD-10-CM

## 2016-02-17 DIAGNOSIS — I10 Essential (primary) hypertension: Secondary | ICD-10-CM

## 2016-02-17 LAB — CBC
HCT: 33 % — ABNORMAL LOW (ref 36.0–46.0)
Hemoglobin: 10.7 g/dL — ABNORMAL LOW (ref 12.0–15.0)
MCHC: 32.4 g/dL (ref 30.0–36.0)
MCV: 80.8 fl (ref 78.0–100.0)
Platelets: 176 10*3/uL (ref 150.0–400.0)
RBC: 4.08 Mil/uL (ref 3.87–5.11)
RDW: 18.2 % — ABNORMAL HIGH (ref 11.5–15.5)
WBC: 4.3 10*3/uL (ref 4.0–10.5)

## 2016-02-17 LAB — COMPREHENSIVE METABOLIC PANEL WITH GFR
ALT: 15 U/L (ref 0–35)
AST: 19 U/L (ref 0–37)
Albumin: 4.3 g/dL (ref 3.5–5.2)
Alkaline Phosphatase: 150 U/L — ABNORMAL HIGH (ref 39–117)
BUN: 27 mg/dL — ABNORMAL HIGH (ref 6–23)
CO2: 22 meq/L (ref 19–32)
Calcium: 9 mg/dL (ref 8.4–10.5)
Chloride: 112 meq/L (ref 96–112)
Creatinine, Ser: 2.58 mg/dL — ABNORMAL HIGH (ref 0.40–1.20)
GFR: 25.29 mL/min — ABNORMAL LOW
Glucose, Bld: 94 mg/dL (ref 70–99)
Potassium: 4 meq/L (ref 3.5–5.1)
Sodium: 142 meq/L (ref 135–145)
Total Bilirubin: 0.4 mg/dL (ref 0.2–1.2)
Total Protein: 7.2 g/dL (ref 6.0–8.3)

## 2016-02-17 LAB — IBC PANEL
Iron: 30 ug/dL — ABNORMAL LOW (ref 42–145)
Saturation Ratios: 7.1 % — ABNORMAL LOW (ref 20.0–50.0)
Transferrin: 302 mg/dL (ref 212.0–360.0)

## 2016-02-17 MED ORDER — CARVEDILOL 12.5 MG PO TABS: ORAL_TABLET | ORAL | 0 refills | Status: DC

## 2016-02-17 MED ORDER — FUROSEMIDE 20 MG PO TABS: 20.0000 mg | ORAL_TABLET | Freq: Every day | ORAL | 1 refills | Status: DC | PRN

## 2016-02-17 MED ORDER — VENLAFAXINE HCL ER 150 MG PO CP24: 150.0000 mg | ORAL_CAPSULE | Freq: Two times a day (BID) | ORAL | 1 refills | Status: DC

## 2016-02-17 MED ORDER — DIAZEPAM 10 MG PO TABS: 10.0000 mg | ORAL_TABLET | Freq: Three times a day (TID) | ORAL | 0 refills | Status: DC | PRN

## 2016-02-17 NOTE — Progress Notes (Signed)
Subjective:    Patient ID: [name], female    DOB: [birth year], 50 y.o.   MRN: [id number]  Chief Complaint  Patient presents with  . Blood work    Dr. Gwynneth Macleod requested blood work and she wanted to get it done here, states that the Dr. faxed over what he wanted done    HPI:  [name] is a 50 y.o. female who  has a past medical history of Anemia; Anxiety; Bipolar disorder ([place]); Depression; History of blood transfusion; Hypertension; IUD; and Seizures ([place]) ([id number]). and presents today for an office follow up.  1.) Hypertension - Currently maintained on carvedilol and furosemide. Reports taking the medication as prescribed and denies adverse side effects or hypotensive readings. Denies worse headache of life or new symptoms of end organ damage. Working on following a low-sodium diet.  BP Readings from Last 3 Encounters:  02/17/16 (!) 160/110  01/01/16 146/89  09/08/15 (!) 137/92    2.)  Iron deficiency anemia - Currently maintained on lifestyle management. Denies symptoms of shortness of breath or being easily fatigued.   Lab Results  Component Value Date   IRON 30 (L) 02/17/2016   TIBC 389 01/06/2014   FERRITIN 9 01/06/2014   3.) Bipolar - Currently maintained on Effexor and toparimate which she reports taking the medication as prescribed and denies adverse side effects. Notes that her symptoms are not adequately controlled with current medication. She does not have a current psychiatrist and is looking for a new referral.    Allergies  Allergen Reactions  . Lactulose Itching  . Ondansetron Nausea And Vomiting  . Quetiapine     Other reaction(s): Other (See Comments) Possible Hair Loss  . Sulfa Antibiotics Rash      Outpatient Medications Prior to Visit  Medication Sig Dispense Refill  . celecoxib (CELEBREX) 200 MG capsule Take 200 mg by mouth daily as needed for mild pain.     . diphenhydrAMINE (BENADRYL) 25 MG tablet Take 50 mg by mouth 2 (two)  times daily as needed for allergies.    Marland Kitchen ibuprofen (ADVIL,MOTRIN) 600 MG tablet Take 1 tablet (600 mg total) by mouth every 8 (eight) hours as needed. 15 tablet 0  . lidocaine (LIDODERM) 5 % APPLY 1 PATCH TO THE SKIN DAILY FOR 12 HOURS THEN REMOVE FOR 12 HOURS. 12 patch 5  . tiZANidine (ZANAFLEX) 4 MG tablet Take 1 tablet (4 mg total) by mouth at bedtime. TAKE 1 TABLET BY MOUTH EVERY 8 HOURS AS NEEDED FOR SPASM(S). 30 tablet 0  . topiramate (TOPAMAX) 100 MG tablet TAKE (2) TABLETS BY MOUTH AT BEDTIME. 60 tablet 0  . traMADol (ULTRAM-ER) 200 MG 24 hr tablet TAKE ONE TABLET BY MOUTH DAILY FOR PAIN. 30 tablet 2  . TraMADol HCl 200 MG CP24 TAKE ONE TABLET BY MOUTH DAILY FOR PAIN. 30 capsule 0  . carvedilol (COREG) 12.5 MG tablet TAKE (1) TABLET BY MOUTH TWICE DAILY WITH A MEAL. 60 tablet 4  . diazepam (VALIUM) 10 MG tablet TAKE 1 TABLET BY MOUTH THREE TIMES DAILY AS NEEDED FOR ANXIETY. 90 tablet 0  . furosemide (LASIX) 20 MG tablet Take 1 tablet (20 mg total) by mouth daily as needed for fluid. 30 tablet 1  . venlafaxine XR (EFFEXOR-XR) 150 MG 24 hr capsule TAKE ONE CAPSULE BY MOUTH TWICE DAILY. 60 capsule 0  . acyclovir (ZOVIRAX) 400 MG tablet Take 1 tablet (400 mg total) by mouth 3 (three) times daily. (Patient not taking: Reported  on 02/17/2016) 30 tablet 0  . dibucaine (NUPERCAINAL) 1 % ointment Apply topically 3 (three) times daily as needed for pain. 30 g 0  . metroNIDAZOLE (FLAGYL) 500 MG tablet Take 1 tablet (500 mg total) by mouth 2 (two) times daily. One po bid x 7 days 14 tablet 0   No facility-administered medications prior to visit.       Past Surgical History:  Procedure Laterality Date  . CARPAL TUNNEL RELEASE Right 07/08/2014   Procedure: RIGHT CARPAL TUNNEL RELEASE;  Surgeon: Sanjuana Kava, MD;  Location: [facility];  Service: Orthopedics;  Laterality: Right;  . CHOLECYSTECTOMY    . GASTRIC BYPASS  2000  . HYSTEROSCOPY W/D&C  11/14/2011   Procedure: DILATATION AND CURETTAGE  /HYSTEROSCOPY;  Surgeon: Marylynn Pearson, MD;  Location: [facility];  Service: Gynecology;;  with removal of polyps  . INTRAUTERINE DEVICE (IUD) INSERTION  03/21/2010  . ROTATOR CUFF REPAIR        Past Medical History:  Diagnosis Date  . Anemia   . Anxiety   . Bipolar disorder ([place])   . Depression   . History of blood transfusion   . Hypertension   . IUD    HISTORY OF IUD --REMOVED IN 2006  . Seizures ([place]) [id number]   two seizures due to a med changes      Review of Systems  Constitutional: Negative for chills and fever.  Respiratory: Negative for chest tightness and shortness of breath.   Cardiovascular: Negative for chest pain, palpitations and leg swelling.  Psychiatric/Behavioral: Negative for behavioral problems, decreased concentration, dysphoric mood, self-injury, sleep disturbance and suicidal ideas. The patient is not nervous/anxious.       Objective:    BP (!) 160/110 (BP Location: Left Arm, Patient Position: Sitting, Cuff Size: Normal)   Pulse 72   Temp 98.1 F (36.7 C) (Oral)   Resp 16   Ht 4\' 11"  (1.499 m)   Wt 157 lb (71.2 kg)   SpO2 91%   BMI 31.71 kg/m  Nursing note and vital signs reviewed.  Physical Exam  Constitutional: She is oriented to person, place, and time. She appears well-developed and well-nourished. No distress.  Eyes: Conjunctivae and EOM are normal. Pupils are equal, round, and reactive to light.  Cardiovascular: Normal rate, regular rhythm, normal heart sounds and intact distal pulses.   Pulmonary/Chest: Effort normal and breath sounds normal.  Neurological: She is alert and oriented to person, place, and time.  Skin: Skin is warm and dry. There is pallor.  Psychiatric: She has a normal mood and affect. Her behavior is normal. Judgment and thought content normal.       Assessment & Plan:   Problem List Items Addressed This Visit      Cardiovascular and Mediastinum   Essential hypertension, benign    Blood pressure remains  uncontrolled and above goal 140/90 with current regimen. There is concern for patient noncompliance with medication regimen based on prescription medication refill history. Encouraged to take blood pressure medications as prescribed prevent development of end organ damage in the future. Continue current dosage of carvedilol and furosemide. Encouraged to monitor blood pressure at home and follow low-sodium diet. Follow-up in 2 weeks for blood pressure check.      Relevant Medications   carvedilol (COREG) 12.5 MG tablet   furosemide (LASIX) 20 MG tablet   Other Relevant Orders   Comprehensive metabolic panel (Completed)     Other   Bipolar disorder ([place])    Patient previously managed  by psychiatry and looking for a new psychiatrist. Physical exam today she does appear restless. Continue current dosage of venlafaxine, diazepam, and topiramate pending new referral to psychiatry.      Relevant Medications   diazepam (VALIUM) 10 MG tablet   venlafaxine XR (EFFEXOR-XR) 150 MG 24 hr capsule   Iron deficiency anemia    Previously noted to have iron deficiency anemia and not maintained on medications. Obtain IBC panel and CBC to check current status.      Relevant Orders   CBC (Completed)   IBC panel (Completed)       I have discontinued [name]'s metroNIDAZOLE and dibucaine. I have also changed her diazepam and venlafaxine XR. Additionally, I am having her maintain her celecoxib, diphenhydrAMINE, TraMADol HCl, acyclovir, ibuprofen, topiramate, lidocaine, traMADol, tiZANidine, (Calcium Acetate, Phos Binder, (CALCIUM ACETATE PO)), carvedilol, and furosemide.   Meds ordered this encounter  Medications  . Calcium Acetate, Phos Binder, (CALCIUM ACETATE PO)    Sig: Take by mouth.  . carvedilol (COREG) 12.5 MG tablet    Sig: TAKE (1) TABLET BY MOUTH TWICE DAILY WITH A MEAL.    Dispense:  180 tablet    Refill:  0    Order Specific Question:   Supervising Provider    Answer:   Pricilla Holm A [[id number]]  . diazepam (VALIUM) 10 MG tablet    Sig: Take 1 tablet (10 mg total) by mouth 3 (three) times daily as needed. for anxiety    Dispense:  90 tablet    Refill:  0    Order Specific Question:   Supervising Provider    Answer:   Pricilla Holm A [[id number]]  . furosemide (LASIX) 20 MG tablet    Sig: Take 1 tablet (20 mg total) by mouth daily as needed for fluid.    Dispense:  30 tablet    Refill:  1    Order Specific Question:   Supervising Provider    Answer:   Pricilla Holm A [[id number]]  . venlafaxine XR (EFFEXOR-XR) 150 MG 24 hr capsule    Sig: Take 1 capsule (150 mg total) by mouth 2 (two) times daily.    Dispense:  60 capsule    Refill:  1    Order Specific Question:   Supervising Provider    Answer:   Pricilla Holm A [[id number]]     Follow-up: Return in about 2 weeks (around 03/02/2016), or if symptoms worsen or fail to improve.  Mauricio Po, FNP

## 2016-02-17 NOTE — Assessment & Plan Note (Signed)
Patient previously managed by psychiatry and looking for a new psychiatrist. Physical exam today she does appear restless. Continue current dosage of venlafaxine, diazepam, and topiramate pending new referral to psychiatry.

## 2016-02-17 NOTE — Assessment & Plan Note (Signed)
Blood pressure remains uncontrolled and above goal 140/90 with current regimen. There is concern for patient noncompliance with medication regimen based on prescription medication refill history. Encouraged to take blood pressure medications as prescribed prevent development of end organ damage in the future. Continue current dosage of carvedilol and furosemide. Encouraged to monitor blood pressure at home and follow low-sodium diet. Follow-up in 2 weeks for blood pressure check.

## 2016-02-17 NOTE — Patient Instructions (Addendum)
Thank you for choosing [facility].  SUMMARY AND INSTRUCTIONS:  They will call to schedule your appointment with psychiatry.  Medication:  Please continue to take medications as prescribed.  Your prescription(s) have been submitted to your pharmacy or been printed and provided for you. Please take as directed and contact our office if you believe you are having problem(s) with the medication(s) or have any questions.  Labs:  Please stop by the lab on the lower level of the building for your blood work. Your results will be released to [place] (or called to you) after review, usually within 72 hours after test completion. If any changes need to be made, you will be notified at that same time.  1.) The lab is open from 7:30am to 5:30 pm Monday-Friday 2.) No appointment is necessary 3.) Fasting (if needed) is 6-8 hours after food and drink; black coffee and water are okay   Follow up:  If your symptoms worsen or fail to improve, please contact our office for further instruction, or in case of emergency go directly to the emergency room at the closest medical facility.   Genital Herpes Genital herpes is a common sexually transmitted infection (STI) that is caused by a virus. The virus is spread from person to person through sexual contact. Infection can cause itching, blisters, and sores in the genital area or rectal area. This is called an outbreak. It affects both men and women. Genital herpes is particularly concerning for pregnant women because the virus can be passed to the baby during delivery and cause serious problems. Genital herpes is also a concern for people with a weakened defense (immune) system. Symptoms of genital herpes may last several days and then go away. However, the virus remains in your body, so you may have more outbreaks of symptoms in the future. The time between outbreaks varies and can be months or years. CAUSES Genital herpes is caused by a virus called  herpes simplex virus (HSV) type 2 or HSV type 1. These viruses are contagious and are most often spread through sexual contact with an infected person. Sexual contact includes vaginal, anal, and oral sex. RISK FACTORS Risk factors for genital herpes include:  Being sexually active with multiple partners.  Having unprotected sex. SIGNS AND SYMPTOMS Symptoms may include:  Pain and itching in the genital area or rectal area.  Small red bumps that turn into blisters and then turn into sores.  Flu-like symptoms, including:  Fever.  Body aches.  Painful urination.  Vaginal discharge. DIAGNOSIS Genital herpes may be diagnosed by:  Physical exam.  Blood test.  Fluid culture test from an open sore. TREATMENT There is no cure for genital herpes. Oral antiviral medicines may be used to speed up healing and to help prevent the return of symptoms. These medicines can also help to reduce the spread of the virus to sexual partners. HOME CARE INSTRUCTIONS  Keep the affected areas dry and clean.  Take medicines only as directed by your health care provider.  Do not have sexual contact during active infections. Genital herpes is contagious.  Practice safe sex. Latex condoms and female condoms may help to prevent the spread of the herpes virus.  Avoid rubbing or touching the blisters and sores. If you do touch the blister or sores:  Wash your hands thoroughly.  Do not touch your eyes afterward.  If you become pregnant, tell your health care provider if you have had genital herpes.  Keep all follow-up visits as  directed by your health care provider. This is important. PREVENTION  Use condoms. Although anyone can contract genital herpes during sexual contact even with the use of a condom, a condom can provide some protection.  Avoid having multiple sexual partners.  Talk to your sexual partner about any symptoms and past history that either of you may have.  Get tested before  you have sex. Ask your partner to do the same.  Recognize the symptoms of genital herpes. Do not have sexual contact if you notice these symptoms. SEEK MEDICAL CARE IF:  Your symptoms are not improving with medicine.  Your symptoms return.  You have new symptoms.  You have a fever.  You have abdominal pain.  You have redness, swelling, or pain in your eye. MAKE SURE YOU:  Understand these instructions.  Will watch your condition.  Will get help right away if you are not doing well or get worse. This information is not intended to replace advice given to you by your health care provider. Make sure you discuss any questions you have with your health care provider. Document Released: 03/04/2000 Document Revised: 03/28/2014 Document Reviewed: 07/23/2013 Elsevier Interactive Patient Education  2017 [employer].

## 2016-02-17 NOTE — Assessment & Plan Note (Signed)
Previously noted to have iron deficiency anemia and not maintained on medications. Obtain IBC panel and CBC to check current status.

## 2016-02-17 NOTE — Assessment & Plan Note (Deleted)
Previously noted to have iron deficiency anemia and not maintained on medications. Obtain IBC panel and CBC to check current status.

## 2016-02-18 ENCOUNTER — Encounter: Payer: Self-pay | Admitting: Family

## 2016-02-22 ENCOUNTER — Other Ambulatory Visit: Payer: Self-pay | Admitting: Family

## 2016-03-01 ENCOUNTER — Other Ambulatory Visit (HOSPITAL_COMMUNITY)
Admission: RE | Admit: 2016-03-01 | Discharge: 2016-03-01 | Disposition: A | Discharge status: Home / Self Care | Payer: Medicare Other | Source: Ambulatory Visit | Attending: Family Medicine | Admitting: Family Medicine

## 2016-03-01 ENCOUNTER — Ambulatory Visit (INDEPENDENT_AMBULATORY_CARE_PROVIDER_SITE_OTHER): Payer: Medicare Other | Admitting: Family Medicine

## 2016-03-01 ENCOUNTER — Encounter: Payer: Self-pay | Admitting: Family Medicine

## 2016-03-01 VITALS — BP 153/92 | HR 102 | Ht 59.0 in | Wt 165.0 lb

## 2016-03-01 DIAGNOSIS — Z1151 Encounter for screening for human papillomavirus (HPV): Secondary | ICD-10-CM | POA: Insufficient documentation

## 2016-03-01 DIAGNOSIS — Z124 Encounter for screening for malignant neoplasm of cervix: Secondary | ICD-10-CM

## 2016-03-01 DIAGNOSIS — A6004 Herpesviral vulvovaginitis: Secondary | ICD-10-CM | POA: Diagnosis not present

## 2016-03-01 DIAGNOSIS — R87810 Cervical high risk human papillomavirus (HPV) DNA test positive: Secondary | ICD-10-CM | POA: Insufficient documentation

## 2016-03-01 DIAGNOSIS — Z01411 Encounter for gynecological examination (general) (routine) with abnormal findings: Secondary | ICD-10-CM | POA: Diagnosis not present

## 2016-03-01 MED ORDER — VALACYCLOVIR HCL 1 G PO TABS: 500.0000 mg | ORAL_TABLET | Freq: Every day | ORAL | 5 refills | Status: DC

## 2016-03-01 NOTE — Assessment & Plan Note (Signed)
For daily suppression

## 2016-03-01 NOTE — Progress Notes (Signed)
   Subjective:    Patient ID: [name] is a 50 y.o. female presenting with New Patient (Initial Visit)  on 03/01/2016  HPI: [name] to the ED and told that she had HSV. Placed on Acyclovir and Flagyl. No cultures done. Previously seen by gynecolgist and not told she had HSV. No recently new sexual partners in last 3 months. No sexual activity in the last 3 months. Never noted outbreak prior to this. Areas of blisters are gone and have not come back. Reports some viral URI symptoms.  Review of Systems  Constitutional: Negative for chills and fever.  Respiratory: Negative for shortness of breath.   Cardiovascular: Negative for chest pain.  Gastrointestinal: Negative for abdominal pain, nausea and vomiting.  Genitourinary: Negative for dysuria.  Skin: Negative for rash.      Objective:    BP (!) 153/92   Pulse (!) 102   Ht 4\' 11"  (1.499 m)   Wt 165 lb (74.8 kg)   BMI 33.33 kg/m  Physical Exam  Constitutional: She is oriented to person, place, and time. She appears well-developed and well-nourished. No distress.  HENT:  Head: Normocephalic and atraumatic.  Eyes: No scleral icterus.  Neck: Neck supple.  Cardiovascular: Normal rate.   Pulmonary/Chest: Effort normal. Right breast exhibits no inverted nipple, no mass and no nipple discharge. Left breast exhibits no inverted nipple, no mass and no nipple discharge.  Abdominal: Soft.  Genitourinary:  Genitourinary Comments: BUS normal, vagina is pink and rugated, cervix is nulliparous without lesion, uterus is small and anteverted, no adnexal mass or tenderness.   Neurological: She is alert and oriented to person, place, and time.  Skin: Skin is warm and dry.  Psychiatric: She has a normal mood and affect.        Assessment & Plan:   Problem List Items Addressed This Visit      Unprioritized   Herpes simplex vulvovaginitis    For daily suppression      Relevant Medications   valACYclovir (VALTREX) 1000 MG tablet    Other Visit Diagnoses    Screening for malignant neoplasm of cervix    -  Primary   Relevant Orders   Cytology - PAP      Total face-to-face time with patient: 20 minutes. Over 50% of encounter was spent on counseling and coordination of care. Return in about 1 year (around 03/01/2017) for a CPE.  [name] 03/01/2016 2:55 PM

## 2016-03-01 NOTE — Patient Instructions (Signed)
Genital Herpes Genital herpes is a common sexually transmitted infection (STI) that is caused by a virus. The virus is spread from person to person through sexual contact. Infection can cause itching, blisters, and sores in the genital area or rectal area. This is called an outbreak. It affects both men and women. Genital herpes is particularly concerning for pregnant women because the virus can be passed to the baby during delivery and cause serious problems. Genital herpes is also a concern for people with a weakened defense (immune) system. Symptoms of genital herpes may last several days and then go away. However, the virus remains in your body, so you may have more outbreaks of symptoms in the future. The time between outbreaks varies and can be months or years. CAUSES Genital herpes is caused by a virus called herpes simplex virus (HSV) type 2 or HSV type 1. These viruses are contagious and are most often spread through sexual contact with an infected person. Sexual contact includes vaginal, anal, and oral sex. RISK FACTORS Risk factors for genital herpes include:  Being sexually active with multiple partners.  Having unprotected sex. SIGNS AND SYMPTOMS Symptoms may include:  Pain and itching in the genital area or rectal area.  Small red bumps that turn into blisters and then turn into sores.  Flu-like symptoms, including:  Fever.  Body aches.  Painful urination.  Vaginal discharge. DIAGNOSIS Genital herpes may be diagnosed by:  Physical exam.  Blood test.  Fluid culture test from an open sore. TREATMENT There is no cure for genital herpes. Oral antiviral medicines may be used to speed up healing and to help prevent the return of symptoms. These medicines can also help to reduce the spread of the virus to sexual partners. HOME CARE INSTRUCTIONS  Keep the affected areas dry and clean.  Take medicines only as directed by your health care provider.  Do not have sexual  contact during active infections. Genital herpes is contagious.  Practice safe sex. Latex condoms and female condoms may help to prevent the spread of the herpes virus.  Avoid rubbing or touching the blisters and sores. If you do touch the blister or sores:  Wash your hands thoroughly.  Do not touch your eyes afterward.  If you become pregnant, tell your health care provider if you have had genital herpes.  Keep all follow-up visits as directed by your health care provider. This is important. PREVENTION  Use condoms. Although anyone can contract genital herpes during sexual contact even with the use of a condom, a condom can provide some protection.  Avoid having multiple sexual partners.  Talk to your sexual partner about any symptoms and past history that either of you may have.  Get tested before you have sex. Ask your partner to do the same.  Recognize the symptoms of genital herpes. Do not have sexual contact if you notice these symptoms. SEEK MEDICAL CARE IF:  Your symptoms are not improving with medicine.  Your symptoms return.  You have new symptoms.  You have a fever.  You have abdominal pain.  You have redness, swelling, or pain in your eye. MAKE SURE YOU:  Understand these instructions.  Will watch your condition.  Will get help right away if you are not doing well or get worse. This information is not intended to replace advice given to you by your health care provider. Make sure you discuss any questions you have with your health care provider. Document Released: 03/04/2000 Document Revised: 03/28/2014 Document  Reviewed: 07/23/2013 Elsevier Interactive Patient Education  2017 [place] Years, Female Preventive care refers to lifestyle choices and visits with your health care provider that can promote health and wellness. What does preventive care include?  A yearly physical exam. This is also called an annual well  check.  Dental exams once or twice a year.  Routine eye exams. Ask your health care provider how often you should have your eyes checked.  Personal lifestyle choices, including:  Daily care of your teeth and gums.  Regular physical activity.  Eating a healthy diet.  Avoiding tobacco and drug use.  Limiting alcohol use.  Practicing safe sex.  Taking low-dose aspirin daily starting at age 22.  Taking vitamin and mineral supplements as recommended by your health care provider. What happens during an annual well check? The services and screenings done by your health care provider during your annual well check will depend on your age, overall health, lifestyle risk factors, and family history of disease. Counseling  Your health care provider may ask you questions about your:  Alcohol use.  Tobacco use.  Drug use.  Emotional well-being.  Home and relationship well-being.  Sexual activity.  Eating habits.  Work and work Statistician.  Method of birth control.  Menstrual cycle.  Pregnancy history. Screening  You may have the following tests or measurements:  Height, weight, and BMI.  Blood pressure.  Lipid and cholesterol levels. These may be checked every 5 years, or more frequently if you are over 69 years old.  Skin check.  Lung cancer screening. You may have this screening every year starting at age 69 if you have a 30-pack-year history of smoking and currently smoke or have quit within the past 15 years.  Fecal occult blood test (FOBT) of the stool. You may have this test every year starting at age 47.  Flexible sigmoidoscopy or colonoscopy. You may have a sigmoidoscopy every 5 years or a colonoscopy every 10 years starting at age 40.  Hepatitis C blood test.  Hepatitis B blood test.  Sexually transmitted disease (STD) testing.  Diabetes screening. This is done by checking your blood sugar (glucose) after you have not eaten for a while (fasting).  You may have this done every 1-3 years.  Mammogram. This may be done every 1-2 years. Talk to your health care provider about when you should start having regular mammograms. This may depend on whether you have a family history of breast cancer.  BRCA-related cancer screening. This may be done if you have a family history of breast, ovarian, tubal, or peritoneal cancers.  Pelvic exam and Pap test. This may be done every 3 years starting at age 39. Starting at age 37, this may be done every 5 years if you have a Pap test in combination with an HPV test.  Bone density scan. This is done to screen for osteoporosis. You may have this scan if you are at high risk for osteoporosis. Discuss your test results, treatment options, and if necessary, the need for more tests with your health care provider. Vaccines  Your health care provider may recommend certain vaccines, such as:  Influenza vaccine. This is recommended every year.  Tetanus, diphtheria, and acellular pertussis (Tdap, Td) vaccine. You may need a Td booster every 10 years.  Varicella vaccine. You may need this if you have not been vaccinated.  Zoster vaccine. You may need this after age 7.  Measles, mumps, and rubella (MMR) vaccine. You may  need at least one dose of MMR if you were born in 1957 or later. You may also need a second dose.  Pneumococcal 13-valent conjugate (PCV13) vaccine. You may need this if you have certain conditions and were not previously vaccinated.  Pneumococcal polysaccharide (PPSV23) vaccine. You may need one or two doses if you smoke cigarettes or if you have certain conditions.  Meningococcal vaccine. You may need this if you have certain conditions.  Hepatitis A vaccine. You may need this if you have certain conditions or if you travel or work in places where you may be exposed to hepatitis A.  Hepatitis B vaccine. You may need this if you have certain conditions or if you travel or work in places where  you may be exposed to hepatitis B.  Haemophilus influenzae type b (Hib) vaccine. You may need this if you have certain conditions. Talk to your health care provider about which screenings and vaccines you need and how often you need them. This information is not intended to replace advice given to you by your health care provider. Make sure you discuss any questions you have with your health care provider. Document Released: 04/03/2015 Document Revised: 11/25/2015 Document Reviewed: 01/06/2015 Elsevier Interactive Patient Education  2017 [employer].

## 2016-03-01 NOTE — Progress Notes (Signed)
Pt was seen at recently and dx with HSV. Pt is very concerned about this dx.  Pt states that she was seen at [facility] and HSV bumps/lesions were removed. Pt was recently treated for BV. Pt has IUD in place and is due for removal next year.

## 2016-03-07 LAB — CYTOLOGY - PAP
HPV 16/18/45 genotyping: NEGATIVE
HPV: DETECTED — AB

## 2016-03-17 ENCOUNTER — Telehealth: Payer: Self-pay | Admitting: Emergency Medicine

## 2016-03-17 DIAGNOSIS — F3160 Bipolar disorder, current episode mixed, unspecified: Secondary | ICD-10-CM

## 2016-03-17 NOTE — Telephone Encounter (Signed)
Referral placed.

## 2016-03-17 NOTE — Telephone Encounter (Signed)
Pt called and stated last time she was in to be seen you both discussed her seeing a psychiatrist. She wants to know if she can get a referral in for that. Thanks.

## 2016-03-17 NOTE — Telephone Encounter (Signed)
Routing to greg, please advise, thanks 

## 2016-03-18 NOTE — Telephone Encounter (Signed)
Left message advising that referral is placed---that dept will call her to make appt

## 2016-03-19 ENCOUNTER — Other Ambulatory Visit: Payer: Self-pay | Admitting: Family

## 2016-03-19 DIAGNOSIS — F3160 Bipolar disorder, current episode mixed, unspecified: Secondary | ICD-10-CM

## 2016-03-22 NOTE — Telephone Encounter (Signed)
Last refill was 02/17/16

## 2016-03-22 NOTE — Telephone Encounter (Signed)
Faxed

## 2016-03-23 ENCOUNTER — Encounter: Payer: Self-pay | Admitting: *Deleted

## 2016-03-23 DIAGNOSIS — E878 Other disorders of electrolyte and fluid balance, not elsewhere classified: Secondary | ICD-10-CM | POA: Diagnosis not present

## 2016-03-23 DIAGNOSIS — E669 Obesity, unspecified: Secondary | ICD-10-CM | POA: Diagnosis not present

## 2016-03-23 DIAGNOSIS — N184 Chronic kidney disease, stage 4 (severe): Secondary | ICD-10-CM | POA: Diagnosis not present

## 2016-03-23 DIAGNOSIS — N2581 Secondary hyperparathyroidism of renal origin: Secondary | ICD-10-CM | POA: Diagnosis not present

## 2016-03-23 DIAGNOSIS — D631 Anemia in chronic kidney disease: Secondary | ICD-10-CM | POA: Diagnosis not present

## 2016-03-23 DIAGNOSIS — E559 Vitamin D deficiency, unspecified: Secondary | ICD-10-CM | POA: Diagnosis not present

## 2016-03-23 DIAGNOSIS — Z6834 Body mass index (BMI) 34.0-34.9, adult: Secondary | ICD-10-CM | POA: Diagnosis not present

## 2016-03-23 DIAGNOSIS — I129 Hypertensive chronic kidney disease with stage 1 through stage 4 chronic kidney disease, or unspecified chronic kidney disease: Secondary | ICD-10-CM | POA: Diagnosis not present

## 2016-03-29 ENCOUNTER — Other Ambulatory Visit: Payer: Self-pay | Admitting: Family

## 2016-03-29 DIAGNOSIS — I10 Essential (primary) hypertension: Secondary | ICD-10-CM

## 2016-04-11 ENCOUNTER — Encounter: Payer: Self-pay | Admitting: Obstetrics and Gynecology

## 2016-04-20 ENCOUNTER — Other Ambulatory Visit: Payer: Self-pay | Admitting: Family

## 2016-04-20 DIAGNOSIS — F3160 Bipolar disorder, current episode mixed, unspecified: Secondary | ICD-10-CM

## 2016-04-20 NOTE — Telephone Encounter (Signed)
Last refill was 03/22/16

## 2016-04-20 NOTE — Telephone Encounter (Signed)
Rx faxed

## 2016-04-26 ENCOUNTER — Other Ambulatory Visit (HOSPITAL_COMMUNITY)
Admission: RE | Admit: 2016-04-26 | Discharge: 2016-04-26 | Disposition: A | Discharge status: Home / Self Care | Payer: Medicare Other | Source: Ambulatory Visit | Attending: Obstetrics and Gynecology | Admitting: Obstetrics and Gynecology

## 2016-04-26 ENCOUNTER — Encounter: Payer: Self-pay | Admitting: *Deleted

## 2016-04-26 ENCOUNTER — Ambulatory Visit: Payer: Medicare Other | Admitting: Obstetrics and Gynecology

## 2016-04-26 DIAGNOSIS — N898 Other specified noninflammatory disorders of vagina: Secondary | ICD-10-CM | POA: Insufficient documentation

## 2016-04-26 DIAGNOSIS — R87612 Low grade squamous intraepithelial lesion on cytologic smear of cervix (LGSIL): Secondary | ICD-10-CM | POA: Insufficient documentation

## 2016-04-26 DIAGNOSIS — N870 Mild cervical dysplasia: Secondary | ICD-10-CM | POA: Diagnosis not present

## 2016-04-26 NOTE — Patient Instructions (Signed)
Colposcopy, Care After This sheet gives you information about how to care for yourself after your procedure. Your health care provider may also give you more specific instructions. If you have problems or questions, contact your health care provider. What can I expect after the procedure? If you had a colposcopy without a biopsy, you can expect to feel fine right away, but you may have some spotting for a few days. You can go back to your normal activities. If you had a colposcopy with a biopsy, it is common to have:  Soreness and pain. This may last for a few days.  Light-headedness.  Mild vaginal bleeding or dark-colored, grainy discharge. This may last for a few days. The discharge may be due to a solution that was used during the procedure. You may need to wear a sanitary pad during this time.  Spotting for at least 48 hours after the procedure. Follow these instructions at home:  Take over-the-counter and prescription medicines only as told by your health care provider. Talk with your health care provider about what type of over-the-counter pain medicine and prescription medicine you can start taking again. It is especially important to talk with your health care provider if you take blood-thinning medicine.  Do not drive or use heavy machinery while taking prescription pain medicine.  For at least 3 days after your procedure, or as long as told by your health care provider, avoid:  Douching.  Using tampons.  Having sexual intercourse.  Continue to use birth control (contraception).  Limit your physical activity for the first day after the procedure as told by your health care provider. Ask your health care provider what activities are safe for you.  It is up to you to get the results of your procedure. Ask your health care provider, or the department performing the procedure, when your results will be ready.  Keep all follow-up visits as told by your health care provider. This  is important. Contact a health care provider if:  You develop a skin rash. Get help right away if:  You are bleeding heavily from your vagina or you are passing blood clots. This includes using more than one sanitary pad per hour for 2 hours in a row.  You have a fever or chills.  You have pelvic pain.  You have abnormal, yellow-colored, or bad-smelling vaginal discharge. This could be a sign of infection.  You have severe pain or cramps in your lower abdomen that do not get better with medicine.  You feel light-headed or dizzy, or you faint. Summary  If you had a colposcopy without a biopsy, you can expect to feel fine right away, but you may have some spotting for a few days. You can go back to your normal activities.  If you had a colposcopy with a biopsy, you may notice mild pain and spotting for 48 hours after the procedure.  Avoid douching, using tampons, and having sexual intercourse for 3 days after the procedure or as long as told by your health care provider.  Contact your health care provider if you have bleeding, severe pain, or signs of infection. This information is not intended to replace advice given to you by your health care provider. Make sure you discuss any questions you have with your health care provider. Document Released: 12/26/2012 Document Revised: 10/23/2015 Document Reviewed: 10/23/2015 Elsevier Interactive Patient Education  2017 [employer].

## 2016-04-26 NOTE — Addendum Note (Signed)
Addended by: Chancy Milroy on: 04/26/2016 04:33 PM   Modules accepted: Orders

## 2016-04-26 NOTE — Addendum Note (Signed)
Addended by: Tristan Schroeder D on: 04/26/2016 03:34 PM   Modules accepted: Orders

## 2016-04-26 NOTE — Progress Notes (Signed)
Colposcopy Procedure Note  Indications: Pap smear 2 months ago showed: low-grade squamous intraepithelial neoplasia (LGSIL - encompassing HPV,mild dysplasia,CIN I). The prior pap showed unknown.  Prior cervical/vaginal disease: normal exam without visible pathology. Prior cervical treatment: no treatment.  Procedure Details  The risks and benefits of the procedure and Written informed consent obtained.  Speculum placed in vagina and excellent visualization of cervix achieved, cervix swabbed x 3 with acetic acid solution.  Findings: Cervix: no visible lesions; IUD strings noted, swabbed with acetic acid, TZ noted, ECC and Bx obtained, monsels applied Vaginal inspection: vaginal colposcopy not performed but noted to be slightly atrophic Vulvar colposcopy: vulvar colposcopy not performed.  Specimens: Cervical Bx  12 and 6 o'clock, ECC, wet prep and GC/C   Complications: none. Pt tolerated well  Plan: Specimens labelled and sent to Pathology. F/U per test results. Post Colposcopy instructions provided to pt.

## 2016-04-28 LAB — CERVICOVAGINAL ANCILLARY ONLY
Bacterial vaginitis: POSITIVE — AB
Candida vaginitis: POSITIVE — AB
Chlamydia: NEGATIVE
Neisseria Gonorrhea: NEGATIVE
Trichomonas: NEGATIVE

## 2016-05-02 ENCOUNTER — Other Ambulatory Visit: Payer: Self-pay

## 2016-05-02 DIAGNOSIS — B379 Candidiasis, unspecified: Secondary | ICD-10-CM

## 2016-05-02 DIAGNOSIS — B9689 Other specified bacterial agents as the cause of diseases classified elsewhere: Secondary | ICD-10-CM

## 2016-05-02 DIAGNOSIS — N760 Acute vaginitis: Secondary | ICD-10-CM

## 2016-05-02 MED ORDER — FLUCONAZOLE 150 MG PO TABS: ORAL_TABLET | ORAL | 0 refills | Status: DC

## 2016-05-02 MED ORDER — METRONIDAZOLE 500 MG PO TABS: 500.0000 mg | ORAL_TABLET | Freq: Two times a day (BID) | ORAL | 0 refills | Status: AC

## 2016-05-18 ENCOUNTER — Other Ambulatory Visit: Payer: Self-pay | Admitting: Family

## 2016-05-18 DIAGNOSIS — F3160 Bipolar disorder, current episode mixed, unspecified: Secondary | ICD-10-CM

## 2016-05-18 NOTE — Telephone Encounter (Signed)
Last refill was 04/20/16

## 2016-05-23 ENCOUNTER — Other Ambulatory Visit: Payer: Self-pay | Admitting: Family

## 2016-05-23 DIAGNOSIS — F3160 Bipolar disorder, current episode mixed, unspecified: Secondary | ICD-10-CM

## 2016-05-23 MED ORDER — DIAZEPAM 10 MG PO TABS: 10.0000 mg | ORAL_TABLET | Freq: Three times a day (TID) | ORAL | 2 refills | Status: DC | PRN

## 2016-06-14 ENCOUNTER — Ambulatory Visit: Payer: Self-pay | Admitting: Family

## 2016-06-16 ENCOUNTER — Other Ambulatory Visit: Payer: Self-pay | Admitting: Family

## 2016-06-16 ENCOUNTER — Telehealth: Payer: Self-pay | Admitting: Orthopaedic Surgery

## 2016-06-16 DIAGNOSIS — F3160 Bipolar disorder, current episode mixed, unspecified: Secondary | ICD-10-CM

## 2016-06-22 ENCOUNTER — Telehealth: Payer: Self-pay | Admitting: Orthopaedic Surgery

## 2016-07-07 DIAGNOSIS — Z9889 Other specified postprocedural states: Secondary | ICD-10-CM | POA: Insufficient documentation

## 2016-07-07 DIAGNOSIS — I10 Essential (primary) hypertension: Secondary | ICD-10-CM | POA: Diagnosis not present

## 2016-07-07 DIAGNOSIS — Z79899 Other long term (current) drug therapy: Secondary | ICD-10-CM | POA: Diagnosis not present

## 2016-07-07 DIAGNOSIS — Z882 Allergy status to sulfonamides status: Secondary | ICD-10-CM | POA: Diagnosis not present

## 2016-07-07 DIAGNOSIS — Z888 Allergy status to other drugs, medicaments and biological substances status: Secondary | ICD-10-CM | POA: Diagnosis not present

## 2016-07-07 DIAGNOSIS — M12812 Other specific arthropathies, not elsewhere classified, left shoulder: Secondary | ICD-10-CM | POA: Insufficient documentation

## 2016-07-07 DIAGNOSIS — Z87891 Personal history of nicotine dependence: Secondary | ICD-10-CM | POA: Diagnosis not present

## 2016-07-21 ENCOUNTER — Other Ambulatory Visit: Payer: Self-pay | Admitting: Family

## 2016-07-21 DIAGNOSIS — F3160 Bipolar disorder, current episode mixed, unspecified: Secondary | ICD-10-CM

## 2016-08-17 ENCOUNTER — Other Ambulatory Visit: Payer: Self-pay | Admitting: Family

## 2016-08-17 ENCOUNTER — Telehealth: Payer: Self-pay | Admitting: Orthopaedic Surgery

## 2016-08-17 DIAGNOSIS — F3160 Bipolar disorder, current episode mixed, unspecified: Secondary | ICD-10-CM

## 2016-08-17 MED ORDER — DIAZEPAM 10 MG PO TABS: 10.0000 mg | ORAL_TABLET | Freq: Three times a day (TID) | ORAL | 0 refills | Status: DC | PRN

## 2016-08-17 NOTE — Telephone Encounter (Signed)
Last refill of diazepam was 07/21/2016

## 2016-08-17 NOTE — Telephone Encounter (Signed)
Rx faxed

## 2016-08-18 ENCOUNTER — Other Ambulatory Visit: Payer: Self-pay

## 2016-08-18 DIAGNOSIS — I10 Essential (primary) hypertension: Secondary | ICD-10-CM

## 2016-08-18 MED ORDER — FUROSEMIDE 20 MG PO TABS: 20.0000 mg | ORAL_TABLET | Freq: Every day | ORAL | 1 refills | Status: DC | PRN

## 2016-08-19 ENCOUNTER — Ambulatory Visit (INDEPENDENT_AMBULATORY_CARE_PROVIDER_SITE_OTHER)
Admission: RE | Admit: 2016-08-19 | Discharge: 2016-08-19 | Disposition: A | Discharge status: Home / Self Care | Payer: Medicare Other | Source: Ambulatory Visit | Attending: Family | Admitting: Family

## 2016-08-19 ENCOUNTER — Encounter: Payer: Self-pay | Admitting: Family

## 2016-08-19 ENCOUNTER — Other Ambulatory Visit: Payer: Self-pay | Admitting: Orthopaedic Surgery

## 2016-08-19 ENCOUNTER — Ambulatory Visit (INDEPENDENT_AMBULATORY_CARE_PROVIDER_SITE_OTHER): Payer: Medicare Other | Admitting: Family

## 2016-08-19 DIAGNOSIS — R109 Unspecified abdominal pain: Secondary | ICD-10-CM | POA: Insufficient documentation

## 2016-08-19 DIAGNOSIS — F3160 Bipolar disorder, current episode mixed, unspecified: Secondary | ICD-10-CM

## 2016-08-19 NOTE — Assessment & Plan Note (Signed)
Increased levels of stress and anxiety and notes medications are not working as effectively. Increase topamax and valium. Encouraged stress and stress relief. Recommend follow up with psychiatry if symptoms do not improve.

## 2016-08-19 NOTE — Progress Notes (Signed)
Subjective:    Patient ID: [name], female    DOB: [birth year], 51 y.o.   MRN: [id number]  Chief Complaint  Patient presents with  . Abdominal Pain    having abdominal pain on right side of stomach, making noises, trouble sleeping and no energy    HPI:  [name] is a 51 y.o. female who  has a past medical history of Anemia; Anxiety; Bipolar disorder ([place]); Depression; History of blood transfusion; Hypertension; IUD; and Seizures ([place]) (0347,4259). and presents today for an office visit.  This is a new problem. Associated symptoms of pain located on the right upper quadrant that has been going on for about a year and half since she had her most recent small bowel obstruction. Describes a lot of gas. Describes looser stools over the past week. Denies nausea, vomiting, or constipation or blood stool. Pain is described as achy. Notes her stomach makes noises. No changes in eating pattern.   Allergies  Allergen Reactions  . Ambien [Zolpidem Tartrate]   . Lactulose Itching  . Ondansetron Nausea And Vomiting  . Quetiapine     Other reaction(s): Other (See Comments) Possible Hair Loss  . Sulfa Antibiotics Rash      Outpatient Medications Prior to Visit  Medication Sig Dispense Refill  . Calcium Acetate, Phos Binder, (CALCIUM ACETATE PO) Take by mouth.    . carvedilol (COREG) 12.5 MG tablet TAKE (1) TABLET BY MOUTH TWICE DAILY WITH A MEAL. 180 tablet 1  . diazepam (VALIUM) 10 MG tablet Take 1 tablet (10 mg total) by mouth 3 (three) times daily as needed. for anxiety 90 tablet 0  . fluconazole (DIFLUCAN) 150 MG tablet Take 1 tab po STAT, then repeat in 3 days 2 tablet 0  . furosemide (LASIX) 20 MG tablet Take 1 tablet (20 mg total) by mouth daily as needed for fluid. 30 tablet 1  . lidocaine (LIDODERM) 5 % APPLY 1 PATCH TO THE SKIN DAILY FOR 12 HOURS THEN REMOVE FOR 12 HOURS. 12 patch 5  . topiramate (TOPAMAX) 100 MG tablet TAKE (2) TABLETS BY MOUTH AT BEDTIME. 60 tablet  0  . traMADol (ULTRAM-ER) 200 MG 24 hr tablet TAKE ONE TABLET BY MOUTH DAILY FOR PAIN. 30 tablet 0  . valACYclovir (VALTREX) 1000 MG tablet Take 0.5 tablets (500 mg total) by mouth daily. 30 tablet 5  . venlafaxine XR (EFFEXOR-XR) 150 MG 24 hr capsule TAKE ONE CAPSULE BY MOUTH TWICE DAILY. 60 capsule 0   No facility-administered medications prior to visit.       Past Surgical History:  Procedure Laterality Date  . CARPAL TUNNEL RELEASE Right 07/08/2014   Procedure: RIGHT CARPAL TUNNEL RELEASE;  Surgeon: Sanjuana Kava, MD;  Location: [facility];  Service: Orthopedics;  Laterality: Right;  . CHOLECYSTECTOMY    . GASTRIC BYPASS  2000  . HYSTEROSCOPY W/D&C  11/14/2011   Procedure: DILATATION AND CURETTAGE /HYSTEROSCOPY;  Surgeon: Marylynn Pearson, MD;  Location: [facility];  Service: Gynecology;;  with removal of polyps  . INTRAUTERINE DEVICE (IUD) INSERTION  03/21/2010  . PANNICULECTOMY    . ROTATOR CUFF REPAIR        Past Medical History:  Diagnosis Date  . Anemia   . Anxiety   . Bipolar disorder ([place])   . Depression   . History of blood transfusion   . Hypertension   . IUD    HISTORY OF IUD --REMOVED IN 2006  . Seizures ([place]) 5638,7564   two seizures due  to a med changes      Review of Systems  Constitutional: Negative for chills and fever.  Gastrointestinal: Positive for abdominal pain. Negative for blood in stool, constipation, nausea and vomiting.      Objective:    BP (!) 162/104 (BP Location: Left Arm, Patient Position: Sitting, Cuff Size: Large)   Pulse 75   Temp 98.8 F (37.1 C) (Oral)   Resp 18   Ht 4\' 11"  (1.499 m)   Wt 174 lb 12.8 oz (79.3 kg)   SpO2 96%   BMI 35.31 kg/m  Nursing note and vital signs reviewed.  Physical Exam  Constitutional: She is oriented to person, place, and time. She appears well-developed and well-nourished. No distress.  Cardiovascular: Normal rate, regular rhythm, normal heart sounds and intact distal pulses.   Pulmonary/Chest:  Effort normal and breath sounds normal.  Abdominal: Soft. Normal appearance and bowel sounds are normal. She exhibits no mass. There is tenderness in the right upper quadrant and right lower quadrant. There is tenderness at McBurney's point. There is no rigidity, no rebound, no guarding, no CVA tenderness and negative Murphy's sign. No hernia.  Neurological: She is alert and oriented to person, place, and time.  Skin: Skin is warm and dry.  Psychiatric: She has a normal mood and affect. Her behavior is normal. Judgment and thought content normal.       Assessment & Plan:   Problem List Items Addressed This Visit      Other   Bipolar disorder ([place])    Increased levels of stress and anxiety and notes medications are not working as effectively. Increase topamax and valium. Encouraged stress and stress relief. Recommend follow up with psychiatry if symptoms do not improve.       Right sided abdominal pain    Chronic right side abdominal pain in the setting of previous Gastric bypass, cholecystectomy and small bowel obstruction. Concern for possible appendix, obstruction or unlikely ischemia. X-ray to rule out obstruction. CT scan to rule out appendicitis. Continue to take medications as prescribed and recommend follow up with gastroenterology and bariatric surgery.       Relevant Orders   DG Abd 2 Views   CT Abdomen Pelvis W Contrast       I am having Ms. Boer maintain her lidocaine, (Calcium Acetate, Phos Binder, (CALCIUM ACETATE PO)), valACYclovir, carvedilol, fluconazole, venlafaxine XR, traMADol, topiramate, diazepam, and furosemide.   Follow-up: Return if symptoms worsen or fail to improve.  Mauricio Po, FNP

## 2016-08-19 NOTE — Patient Instructions (Addendum)
Thank you for choosing [facility].  SUMMARY AND INSTRUCTIONS:  They will call to schedule your appointment for a CT scan.  Please get the x-rays today.  Increase topamax to 250 mg daily which is 2.5 mg daily.  Increase Valium to 10 mg up to 4 times per day.   Follow up with your surgeon and gastroenterologist.   Medication:  Your prescription(s) have been submitted to your pharmacy or been printed and provided for you. Please take as directed and contact our office if you believe you are having problem(s) with the medication(s) or have any questions.  Labs:  Please stop by the lab on the lower level of the building for your blood work. Your results will be released to [place] (or called to you) after review, usually within 72 hours after test completion. If any changes need to be made, you will be notified at that same time.  1.) The lab is open from 7:30am to 5:30 pm Monday-Friday 2.) No appointment is necessary 3.) Fasting (if needed) is 6-8 hours after food and drink; black coffee and water are okay   Follow up:  If your symptoms worsen or fail to improve, please contact our office for further instruction, or in case of emergency go directly to the emergency room at the closest medical facility.

## 2016-08-19 NOTE — Assessment & Plan Note (Addendum)
Chronic right side abdominal pain in the setting of previous Gastric bypass, cholecystectomy and small bowel obstruction. Concern for possible appendix, obstruction or unlikely ischemia. X-ray to rule out obstruction. CT scan to rule out appendicitis. Continue to take medications as prescribed and recommend follow up with gastroenterology and bariatric surgery.

## 2016-08-22 ENCOUNTER — Other Ambulatory Visit: Payer: Self-pay | Admitting: Family

## 2016-08-22 ENCOUNTER — Telehealth: Payer: Self-pay | Admitting: Family

## 2016-08-22 ENCOUNTER — Encounter: Payer: Self-pay | Admitting: Radiology

## 2016-08-22 DIAGNOSIS — Z5181 Encounter for therapeutic drug level monitoring: Secondary | ICD-10-CM

## 2016-08-22 DIAGNOSIS — R109 Unspecified abdominal pain: Secondary | ICD-10-CM

## 2016-08-22 NOTE — Telephone Encounter (Signed)
Due to creatinine levels in the past, [facility] is concerned about giving IV contrast to pt. She states oral contrast can be used. Need updated labs if the order is to stay as is. Last labs to check kidneys was done in 01/2016. Please advise

## 2016-08-22 NOTE — Telephone Encounter (Signed)
Orders placed to recheck kidney function. Ok to scan without contrast if necessary.

## 2016-08-22 NOTE — Telephone Encounter (Signed)
[facility] aware.

## 2016-08-22 NOTE — Telephone Encounter (Signed)
States patient is scheduled to come in to their office for order placed by [facility] tomorrow 6/5.  States she is looking for health history on patient due to creatinine levels being low. Please follow up in regard.

## 2016-08-23 ENCOUNTER — Telehealth: Payer: Self-pay | Admitting: Family

## 2016-08-23 ENCOUNTER — Inpatient Hospital Stay: Admission: RE | Admit: 2016-08-23 | Payer: Self-pay | Source: Ambulatory Visit

## 2016-08-23 NOTE — Telephone Encounter (Signed)
Just FYI Pt did not show up for CT scan today.

## 2016-08-24 NOTE — Telephone Encounter (Signed)
Noted  

## 2016-08-30 NOTE — Telephone Encounter (Signed)
Pt called her earlier and was transferred to [facility] imaging to set up an appt,she was told they could not make her an appt because they did not have an order. There are 2 orders put in for her CT, please let me know what [facility] says and I will call her back.

## 2016-08-30 NOTE — Telephone Encounter (Signed)
Spoke with [facility] and they informed me that the orders from last week should be good for her to still use. They stated they will call her to reschedule.

## 2016-08-31 ENCOUNTER — Ambulatory Visit
Admission: RE | Admit: 2016-08-31 | Discharge: 2016-08-31 | Disposition: A | Discharge status: Home / Self Care | Payer: Medicare Other | Source: Ambulatory Visit | Attending: Family | Admitting: Family

## 2016-08-31 DIAGNOSIS — K429 Umbilical hernia without obstruction or gangrene: Secondary | ICD-10-CM | POA: Diagnosis not present

## 2016-08-31 DIAGNOSIS — R109 Unspecified abdominal pain: Secondary | ICD-10-CM

## 2016-08-31 MED ORDER — IOHEXOL 300 MG/ML  SOLN
30.0000 mL | Freq: Once | INTRAMUSCULAR | Status: AC | PRN
Start: 2016-08-31 — End: 2016-08-31
  Administered 2016-08-31: 30 mL via ORAL

## 2016-09-02 NOTE — Telephone Encounter (Signed)
Pt aware of results. Any recommendations made by Marya Amsler in regards will be given to her on Monday when Marya Amsler returns. Pt understood.

## 2016-09-02 NOTE — Telephone Encounter (Signed)
Will you please take a look at this in Greg's absence.

## 2016-09-02 NOTE — Telephone Encounter (Signed)
Pt called and would like her results from 6/13 from her CT

## 2016-09-02 NOTE — Telephone Encounter (Signed)
Ct shows constipation.   Some soft tissue swelling in the skin - not sure why.  She has a hernia in her belly button and arthritis in her back.

## 2016-09-03 NOTE — Telephone Encounter (Signed)
Please inform patient that her CT scan shows that she has a significant amount of constipation that may be contributing to her symptoms. There is also some edema which is non-specific and likely unrelated. If constipation is resolved and continues to have symptoms recommend follow up with Gastroenterology.

## 2016-09-05 NOTE — Telephone Encounter (Signed)
Pt aware of results. States she was going to call back to make an appointment to discuss further.

## 2016-09-05 NOTE — Telephone Encounter (Signed)
Pt called checking on the results from her CT scan.

## 2016-09-05 NOTE — Telephone Encounter (Signed)
Made pt aware of results below. Stated that she will call back to make an appointment to discuss further results.

## 2016-09-15 ENCOUNTER — Telehealth: Payer: Self-pay | Admitting: Family

## 2016-09-15 ENCOUNTER — Telehealth: Payer: Self-pay | Admitting: Orthopaedic Surgery

## 2016-09-15 DIAGNOSIS — F3160 Bipolar disorder, current episode mixed, unspecified: Secondary | ICD-10-CM

## 2016-09-15 DIAGNOSIS — I10 Essential (primary) hypertension: Secondary | ICD-10-CM

## 2016-09-15 MED ORDER — DIAZEPAM 10 MG PO TABS: 10.0000 mg | ORAL_TABLET | Freq: Three times a day (TID) | ORAL | 2 refills | Status: DC

## 2016-09-15 MED ORDER — TOPIRAMATE 100 MG PO TABS: ORAL_TABLET | ORAL | 0 refills | Status: DC

## 2016-09-15 NOTE — Telephone Encounter (Signed)
Miss and refill pt diazepam. Last filled 08/17/16.  Pls advise of ok to refill...Johny Chess

## 2016-09-15 NOTE — Telephone Encounter (Signed)
Medication refilled

## 2016-09-15 NOTE — Addendum Note (Signed)
Addended by: Mauricio Po D on: 09/15/2016 09:28 PM   Modules accepted: Orders

## 2016-09-15 NOTE — Telephone Encounter (Signed)
Pt is needing a refill on Valium and Topamax.

## 2016-09-15 NOTE — Telephone Encounter (Signed)
Last refill for valium was 08/17/16 per [facility]. Topamax has been sent to pharmacy.

## 2016-09-16 NOTE — Telephone Encounter (Signed)
Faxed

## 2016-09-26 ENCOUNTER — Ambulatory Visit: Payer: Self-pay | Admitting: Family

## 2016-09-28 ENCOUNTER — Ambulatory Visit: Payer: Self-pay | Admitting: Family

## 2016-09-28 ENCOUNTER — Other Ambulatory Visit: Payer: Self-pay | Admitting: Family

## 2016-09-28 DIAGNOSIS — I10 Essential (primary) hypertension: Secondary | ICD-10-CM

## 2016-09-28 DIAGNOSIS — F3160 Bipolar disorder, current episode mixed, unspecified: Secondary | ICD-10-CM

## 2016-09-28 NOTE — Telephone Encounter (Signed)
Patient states [name] was to change topamax to 3 times a day and valium to 4 times a day.  Patient is requesting changes to be made.  States she would like to use [facility] at  [address] for this.

## 2016-09-29 ENCOUNTER — Ambulatory Visit (INDEPENDENT_AMBULATORY_CARE_PROVIDER_SITE_OTHER): Payer: Medicare Other | Admitting: Orthopaedic Surgery

## 2016-09-29 VITALS — BP 175/104 | HR 76 | Temp 96.9°F | Ht 59.0 in | Wt 174.0 lb

## 2016-09-29 DIAGNOSIS — M25511 Pain in right shoulder: Secondary | ICD-10-CM

## 2016-09-29 DIAGNOSIS — M25512 Pain in left shoulder: Secondary | ICD-10-CM | POA: Diagnosis not present

## 2016-09-29 DIAGNOSIS — G8929 Other chronic pain: Secondary | ICD-10-CM | POA: Diagnosis not present

## 2016-09-29 MED ORDER — CARVEDILOL 12.5 MG PO TABS: ORAL_TABLET | ORAL | 0 refills | Status: DC

## 2016-09-29 MED ORDER — TRAMADOL HCL ER 200 MG PO TB24: ORAL_TABLET | ORAL | 2 refills | Status: DC

## 2016-09-29 MED ORDER — TOPIRAMATE 100 MG PO TABS: ORAL_TABLET | ORAL | 0 refills | Status: DC

## 2016-09-29 MED ORDER — HYDROCODONE-ACETAMINOPHEN 7.5-325 MG PO TABS: ORAL_TABLET | ORAL | 0 refills | Status: DC

## 2016-09-29 MED ORDER — VENLAFAXINE HCL ER 150 MG PO CP24: 150.0000 mg | ORAL_CAPSULE | Freq: Two times a day (BID) | ORAL | 0 refills | Status: DC

## 2016-09-29 MED ORDER — DIAZEPAM 10 MG PO TABS: 10.0000 mg | ORAL_TABLET | Freq: Three times a day (TID) | ORAL | 2 refills | Status: DC

## 2016-09-29 NOTE — Telephone Encounter (Signed)
Medications have been resent. 

## 2016-09-29 NOTE — Progress Notes (Signed)
Patient [name], female DOB:1965/11/14, 51 y.o. WFU:[id number]  Chief Complaint  Patient presents with  . Follow-up    Bilateral Shoulder Pain    HPI  [name] is a 51 y.o. female who has bilateral shoulder pain.  She has had increasing pain that is not relieved now by the Tramadol.  She would like another pain medicine. I will give hydrocodone.  She was seen at [facility] for her shoulders but has decided not to have total shoulder procedure at this time. HPI  Body mass index is 35.14 kg/m.  ROS  Review of Systems  HENT: Negative for congestion.   Respiratory: Negative for cough and shortness of breath.   Cardiovascular: Negative for chest pain and leg swelling.  Endocrine: Positive for cold intolerance.  Musculoskeletal: Positive for arthralgias.  Allergic/Immunologic: Positive for environmental allergies.    Past Medical History:  Diagnosis Date  . Anemia   . Anxiety   . Bipolar disorder ([place])   . Depression   . History of blood transfusion   . Hypertension   . IUD    HISTORY OF IUD --REMOVED IN 2006  . Seizures ([place]) [id number]   two seizures due to a med changes    Past Surgical History:  Procedure Laterality Date  . CARPAL TUNNEL RELEASE Right 07/08/2014   Procedure: RIGHT CARPAL TUNNEL RELEASE;  Surgeon: Sanjuana Kava, MD;  Location: [facility];  Service: Orthopedics;  Laterality: Right;  . CHOLECYSTECTOMY    . GASTRIC BYPASS  2000  . HYSTEROSCOPY W/D&C  11/14/2011   Procedure: DILATATION AND CURETTAGE /HYSTEROSCOPY;  Surgeon: Marylynn Pearson, MD;  Location: [facility];  Service: Gynecology;;  with removal of polyps  . INTRAUTERINE DEVICE (IUD) INSERTION  03/21/2010  . PANNICULECTOMY    . ROTATOR CUFF REPAIR      Family History  Problem Relation Age of Onset  . Hypertension Mother   . Kidney failure Mother   . Hypertension Father   . Diabetes Cousin   . Diabetes Sister   . Hypertension Sister   . Hypertension Brother   .  Hypertension Maternal Aunt   . Hypertension Maternal Uncle   . Hypertension Maternal Grandmother   . Hypertension Maternal Grandfather   . Hypertension Paternal Grandmother   . Hypertension Paternal Grandfather     Social History Social History  Substance Use Topics  . Smoking status: Former Smoker    Packs/day: 0.25    Years: 4.00    Types: Cigarettes    Quit date: 03/21/1988  . Smokeless tobacco: Never Used  . Alcohol use Yes     Comment: social    Allergies  Allergen Reactions  . Ambien [Zolpidem Tartrate]   . Lactulose Itching  . Ondansetron Nausea And Vomiting  . Quetiapine     Other reaction(s): Other (See Comments) Possible Hair Loss  . Sulfa Antibiotics Rash    Current Outpatient Prescriptions  Medication Sig Dispense Refill  . Calcium Acetate, Phos Binder, (CALCIUM ACETATE PO) Take by mouth.    . carvedilol (COREG) 12.5 MG tablet TAKE (1) TABLET BY MOUTH TWICE DAILY WITH A MEAL. 180 tablet 0  . diazepam (VALIUM) 10 MG tablet Take 1 tablet (10 mg total) by mouth 3 (three) times daily. 90 tablet 2  . fluconazole (DIFLUCAN) 150 MG tablet Take 1 tab po STAT, then repeat in 3 days 2 tablet 0  . furosemide (LASIX) 20 MG tablet Take 1 tablet (20 mg total) by mouth daily as needed for fluid.  30 tablet 1  . HYDROcodone-acetaminophen (NORCO) 7.5-325 MG tablet One tablet every six hours as needed for pain.  30 day limit.  Must last 30 days. 120 tablet 0  . lidocaine (LIDODERM) 5 % APPLY 1 PATCH TO THE SKIN DAILY FOR 12 HOURS THEN REMOVE FOR 12 HOURS. 12 patch 5  . topiramate (TOPAMAX) 100 MG tablet TAKE (2) TABLETS BY MOUTH AT BEDTIME. 60 tablet 0  . traMADol (ULTRAM-ER) 200 MG 24 hr tablet TAKE ONE TABLET BY MOUTH DAILY FOR PAIN. 30 tablet 2  . valACYclovir (VALTREX) 1000 MG tablet Take 0.5 tablets (500 mg total) by mouth daily. 30 tablet 5  . venlafaxine XR (EFFEXOR-XR) 150 MG 24 hr capsule TAKE ONE CAPSULE BY MOUTH TWICE DAILY. 180 capsule 0   No current  facility-administered medications for this visit.      Physical Exam  Blood pressure (!) 175/104, pulse 76, temperature (!) 96.9 F (36.1 C), height 4\' 11"  (1.499 m), weight 174 lb (78.9 kg).  Constitutional: overall normal hygiene, normal nutrition, well developed, normal grooming, normal body habitus. Assistive device:none  Musculoskeletal: gait and station Limp none, muscle tone and strength are normal, no tremors or atrophy is present.  .  Neurological: coordination overall normal.  Deep tendon reflex/nerve stretch intact.  Sensation normal.  Cranial nerves II-XII intact.   Skin:   Normal overall no scars, lesions, ulcers or rashes. No psoriasis.  Psychiatric: Alert and oriented x 3.  Recent memory intact, remote memory unclear.  Normal mood and affect. Well groomed.  Good eye contact.  Cardiovascular: overall no swelling, no varicosities, no edema bilaterally, normal temperatures of the legs and arms, no clubbing, cyanosis and good capillary refill.  Lymphatic: palpation is normal.  Both shoulders are tender and have decreased motion and pain.  She has no effusion.  NV intact.  The patient has been educated about the nature of the problem(s) and counseled on treatment options.  The patient appeared to understand what I have discussed and is in agreement with it.  No diagnosis found.  PLAN Call if any problems.  Precautions discussed.  Continue current medications.   Return to clinic 3 weeks   I have reviewed the [place] web site prior to prescribing narcotic medicine for this patient.  Electronically Signed Sanjuana Kava, MD 7/12/201811:56 AM

## 2016-09-29 NOTE — Telephone Encounter (Signed)
Pt called and states all the meds sent on 6/28 were supposed to be sent to [facility] on [address]  Please resend  Pt does not use [facility]

## 2016-10-04 ENCOUNTER — Ambulatory Visit (INDEPENDENT_AMBULATORY_CARE_PROVIDER_SITE_OTHER): Payer: Medicare Other | Admitting: Family

## 2016-10-04 ENCOUNTER — Encounter: Payer: Self-pay | Admitting: Family

## 2016-10-04 DIAGNOSIS — R109 Unspecified abdominal pain: Secondary | ICD-10-CM | POA: Diagnosis not present

## 2016-10-04 DIAGNOSIS — F3160 Bipolar disorder, current episode mixed, unspecified: Secondary | ICD-10-CM | POA: Diagnosis not present

## 2016-10-04 MED ORDER — DIAZEPAM 10 MG PO TABS: 10.0000 mg | ORAL_TABLET | Freq: Four times a day (QID) | ORAL | 0 refills | Status: DC

## 2016-10-04 MED ORDER — TOPIRAMATE 100 MG PO TABS: 250.0000 mg | ORAL_TABLET | Freq: Two times a day (BID) | ORAL | 0 refills | Status: DC

## 2016-10-04 NOTE — Assessment & Plan Note (Addendum)
Continues to experience right sided abdominal pain with CT scan showing signs of constipation. There is concern for irritable bowel syndrome of mixed type. Recommend over-the-counter medications as needed for symptom relief. Advised to follow-up with gastroenterology/bariatric surgery given previous history of bariatric surgery. Increase fiber intake and continue with water consumption. Follow-up if symptoms worsen or do not improve.

## 2016-10-04 NOTE — Patient Instructions (Signed)
Thank you for choosing [facility].  SUMMARY AND INSTRUCTIONS:  Recommend follow up with gastroenterology / surgery.  Increase fiber as tolerated consider metamucil and benefiber.   Stool softener as needed for hard stools.  Follow up:  If your symptoms worsen or fail to improve, please contact our office for further instruction, or in case of emergency go directly to the emergency room at the closest medical facility.     Constipation, Adult Constipation is when a person:  Poops (has a bowel movement) fewer times in a week than normal.  Has a hard time pooping.  Has poop that is dry, hard, or bigger than normal.  Follow these instructions at home: Eating and drinking   Eat foods that have a lot of fiber, such as: ? Fresh fruits and vegetables. ? Whole grains. ? Beans.  Eat less of foods that are high in fat, low in fiber, or overly processed, such as: ? [place] fries. ? Hamburgers. ? Cookies. ? Candy. ? Soda.  Drink enough fluid to keep your pee (urine) clear or pale yellow. General instructions  Exercise regularly or as told by your doctor.  Go to the restroom when you feel like you need to poop. Do not hold it in.  Take over-the-counter and prescription medicines only as told by your doctor. These include any fiber supplements.  Do pelvic floor retraining exercises, such as: ? Doing deep breathing while relaxing your lower belly (abdomen). ? Relaxing your pelvic floor while pooping.  Watch your condition for any changes.  Keep all follow-up visits as told by your doctor. This is important. Contact a doctor if:  You have pain that gets worse.  You have a fever.  You have not pooped for 4 days.  You throw up (vomit).  You are not hungry.  You lose weight.  You are bleeding from the anus.  You have thin, pencil-like poop (stool). Get help right away if:  You have a fever, and your symptoms suddenly get worse.  You leak poop or have  blood in your poop.  Your belly feels hard or bigger than normal (is bloated).  You have very bad belly pain.  You feel dizzy or you faint. This information is not intended to replace advice given to you by your health care provider. Make sure you discuss any questions you have with your health care provider. Document Released: 08/24/2007 Document Revised: 09/25/2015 Document Reviewed: 08/26/2015 Elsevier Interactive Patient Education  2017 [employer].

## 2016-10-04 NOTE — Progress Notes (Signed)
Subjective:    Patient ID: [name], female    DOB: [birth year], 51 y.o.   MRN: [id number]  Chief Complaint  Patient presents with  . Follow-up    talk about recent CT results    HPI:  [name] is a 51 y.o. female who  has a past medical history of Anemia; Anxiety; Bipolar disorder ([place]); Depression; History of blood transfusion; Hypertension; IUD; and Seizures ([place]) ([id number]). and presents today for a follow up office visit.  Continues to feel the associated symptoms of right lower quadrant pain with CT scan showing constipation, and an umbilical hernia consisting of adipose tissue.  Describes quality of stools as ranging from diarrhea to constipation. Denies any modifying factors that make it better or worse. Frequency of change between diarrhea and constipation varies. Not currently consuming a quality fiber intake but is drinking plenty of water.     Allergies  Allergen Reactions  . Ambien [Zolpidem Tartrate]   . Lactulose Itching  . Ondansetron Nausea And Vomiting  . Quetiapine     Other reaction(s): Other (See Comments) Possible Hair Loss  . Sulfa Antibiotics Rash      Outpatient Medications Prior to Visit  Medication Sig Dispense Refill  . carvedilol (COREG) 12.5 MG tablet TAKE (1) TABLET BY MOUTH TWICE DAILY WITH A MEAL. 180 tablet 0  . furosemide (LASIX) 20 MG tablet Take 1 tablet (20 mg total) by mouth daily as needed for fluid. 30 tablet 1  . HYDROcodone-acetaminophen (NORCO) 7.5-325 MG tablet One tablet every six hours as needed for pain.  30 day limit.  Must last 30 days. 120 tablet 0  . lidocaine (LIDODERM) 5 % APPLY 1 PATCH TO THE SKIN DAILY FOR 12 HOURS THEN REMOVE FOR 12 HOURS. 12 patch 5  . valACYclovir (VALTREX) 1000 MG tablet Take 0.5 tablets (500 mg total) by mouth daily. 30 tablet 5  . venlafaxine XR (EFFEXOR-XR) 150 MG 24 hr capsule Take 1 capsule (150 mg total) by mouth 2 (two) times daily. 180 capsule 0  . Calcium Acetate, Phos  Binder, (CALCIUM ACETATE PO) Take by mouth.    . diazepam (VALIUM) 10 MG tablet Take 1 tablet (10 mg total) by mouth 3 (three) times daily. 90 tablet 2  . fluconazole (DIFLUCAN) 150 MG tablet Take 1 tab po STAT, then repeat in 3 days 2 tablet 0  . topiramate (TOPAMAX) 100 MG tablet TAKE (2) TABLETS BY MOUTH AT BEDTIME. 60 tablet 0  . traMADol (ULTRAM-ER) 200 MG 24 hr tablet TAKE ONE TABLET BY MOUTH DAILY FOR PAIN. 30 tablet 2   No facility-administered medications prior to visit.     Review of Systems  Constitutional: Negative for chills and fever.  Cardiovascular: Negative for chest pain, palpitations and leg swelling.  Gastrointestinal: Positive for abdominal pain, constipation and diarrhea. Negative for anal bleeding, nausea, rectal pain and vomiting.      Objective:    BP (!) 170/110 (BP Location: Left Arm, Patient Position: Sitting, Cuff Size: Large)   Pulse 73   Resp 16   Ht 4\' 11"  (1.499 m)   Wt 183 lb 1.9 oz (83.1 kg)   SpO2 96%   BMI 36.99 kg/m  Nursing note and vital signs reviewed.  Physical Exam  Constitutional: She is oriented to person, place, and time. She appears well-developed and well-nourished. No distress.  Cardiovascular: Normal rate, regular rhythm, normal heart sounds and intact distal pulses.   Pulmonary/Chest: Effort normal and breath sounds normal.  Abdominal: Soft. Bowel sounds are normal. She exhibits no distension and no mass. There is tenderness. There is no rebound and no guarding.  Neurological: She is alert and oriented to person, place, and time.  Skin: Skin is warm and dry.  Psychiatric: She has a normal mood and affect. Her behavior is normal. Judgment and thought content normal.       Assessment & Plan:   Problem List Items Addressed This Visit      Other   Bipolar disorder ([place])   Relevant Medications   diazepam (VALIUM) 10 MG tablet   Right sided abdominal pain    Continues to experience right sided abdominal pain with CT scan  showing signs of constipation. There is concern for irritable bowel syndrome of mixed type. Recommend over-the-counter medications as needed for symptom relief. Advised to follow-up with gastroenterology/bariatric surgery given previous history of bariatric surgery. Increase fiber intake and continue with water consumption. Follow-up if symptoms worsen or do not improve.          I have discontinued Ms. Morgenthaler (Calcium Acetate, Phos Binder, (CALCIUM ACETATE PO)), fluconazole, and traMADol. I have also changed her diazepam and topiramate. Additionally, I am having her maintain her lidocaine, valACYclovir, furosemide, HYDROcodone-acetaminophen, carvedilol, and venlafaxine XR.   Meds ordered this encounter  Medications  . diazepam (VALIUM) 10 MG tablet    Sig: Take 1 tablet (10 mg total) by mouth 4 (four) times daily.    Dispense:  120 tablet    Refill:  0    Order Specific Question:   Supervising Provider    Answer:   Pricilla Holm A [[id number]]  . topiramate (TOPAMAX) 100 MG tablet    Sig: Take 2.5 tablets (250 mg total) by mouth 2 (two) times daily.    Dispense:  150 tablet    Refill:  0    Order Specific Question:   Supervising Provider    Answer:   Pricilla Holm A [[id number]]     Follow-up: Return if symptoms worsen or fail to improve.  Mauricio Po, FNP

## 2016-10-10 ENCOUNTER — Telehealth: Payer: Self-pay | Admitting: Orthopaedic Surgery

## 2016-10-10 MED ORDER — HYDROCODONE-ACETAMINOPHEN 7.5-325 MG PO TABS: ORAL_TABLET | ORAL | 0 refills | Status: DC

## 2016-10-10 NOTE — Telephone Encounter (Signed)
Patient called, states [facility] [address], [place], [place]) filled only 7 pills of her pain medication - states 2ndary insurance, BCBS, needs authorization from Dr Luna Glasgow:  HYDROcodone-acetaminophen ([place]) 7.5-325 MG tablet 120 tablet   - please advise.

## 2016-10-20 ENCOUNTER — Ambulatory Visit: Payer: Medicare Other | Admitting: Orthopaedic Surgery

## 2016-10-27 ENCOUNTER — Ambulatory Visit (INDEPENDENT_AMBULATORY_CARE_PROVIDER_SITE_OTHER): Payer: Medicare Other | Admitting: Orthopaedic Surgery

## 2016-10-27 ENCOUNTER — Encounter: Payer: Self-pay | Admitting: Orthopaedic Surgery

## 2016-10-27 VITALS — BP 169/115 | HR 77 | Temp 95.7°F | Ht 59.0 in | Wt 178.0 lb

## 2016-10-27 DIAGNOSIS — G8929 Other chronic pain: Secondary | ICD-10-CM

## 2016-10-27 DIAGNOSIS — M25511 Pain in right shoulder: Secondary | ICD-10-CM

## 2016-10-27 DIAGNOSIS — M25512 Pain in left shoulder: Secondary | ICD-10-CM | POA: Diagnosis not present

## 2016-10-27 MED ORDER — TIZANIDINE HCL 4 MG PO TABS: ORAL_TABLET | ORAL | 3 refills | Status: DC

## 2016-10-27 MED ORDER — TRAMADOL HCL 50 MG PO TABS: 50.0000 mg | ORAL_TABLET | Freq: Four times a day (QID) | ORAL | 3 refills | Status: DC | PRN

## 2016-10-27 NOTE — Progress Notes (Signed)
Patient [name], female DOB:1965/03/23, 51 y.o. YIF:[id number]  Chief Complaint  Patient presents with  . Follow-up    Shoulder pain    HPI  [name] is a 51 y.o. female who has chronic shoulder pain.  She tried the hydrocodone but it made her itch.  I will try to resume the Toradol but have her take it more often.  I will add Zanaflex also.  Precautions discussed.  Try to do her shoulder exercises as well. HPI  Body mass index is 35.95 kg/m.  ROS  Review of Systems  HENT: Negative for congestion.   Respiratory: Negative for cough and shortness of breath.   Cardiovascular: Negative for chest pain and leg swelling.  Endocrine: Positive for cold intolerance.  Musculoskeletal: Positive for arthralgias.  Allergic/Immunologic: Positive for environmental allergies.    Past Medical History:  Diagnosis Date  . Anemia   . Anxiety   . Bipolar disorder ([place])   . Depression   . History of blood transfusion   . Hypertension   . IUD    HISTORY OF IUD --REMOVED IN 2006  . Seizures ([place]) [id number]   two seizures due to a med changes    Past Surgical History:  Procedure Laterality Date  . CARPAL TUNNEL RELEASE Right 07/08/2014   Procedure: RIGHT CARPAL TUNNEL RELEASE;  Surgeon: Sanjuana Kava, MD;  Location: [facility];  Service: Orthopedics;  Laterality: Right;  . CHOLECYSTECTOMY    . GASTRIC BYPASS  2000  . HYSTEROSCOPY W/D&C  11/14/2011   Procedure: DILATATION AND CURETTAGE /HYSTEROSCOPY;  Surgeon: Marylynn Pearson, MD;  Location: [facility];  Service: Gynecology;;  with removal of polyps  . INTRAUTERINE DEVICE (IUD) INSERTION  03/21/2010  . PANNICULECTOMY    . ROTATOR CUFF REPAIR      Family History  Problem Relation Age of Onset  . Hypertension Mother   . Kidney failure Mother   . Hypertension Father   . Diabetes Cousin   . Diabetes Sister   . Hypertension Sister   . Hypertension Brother   . Hypertension Maternal Aunt   . Hypertension Maternal Uncle   .  Hypertension Maternal Grandmother   . Hypertension Maternal Grandfather   . Hypertension Paternal Grandmother   . Hypertension Paternal Grandfather     Social History Social History  Substance Use Topics  . Smoking status: Former Smoker    Packs/day: 0.25    Years: 4.00    Types: Cigarettes    Quit date: 03/21/1988  . Smokeless tobacco: Never Used  . Alcohol use Yes     Comment: social    Allergies  Allergen Reactions  . Ambien [Zolpidem Tartrate]   . Lactulose Itching  . Ondansetron Nausea And Vomiting  . Quetiapine     Other reaction(s): Other (See Comments) Possible Hair Loss  . Sulfa Antibiotics Rash    Current Outpatient Prescriptions  Medication Sig Dispense Refill  . carvedilol (COREG) 12.5 MG tablet TAKE (1) TABLET BY MOUTH TWICE DAILY WITH A MEAL. 180 tablet 0  . diazepam (VALIUM) 10 MG tablet Take 1 tablet (10 mg total) by mouth 4 (four) times daily. 120 tablet 0  . furosemide (LASIX) 20 MG tablet Take 1 tablet (20 mg total) by mouth daily as needed for fluid. 30 tablet 1  . lidocaine (LIDODERM) 5 % APPLY 1 PATCH TO THE SKIN DAILY FOR 12 HOURS THEN REMOVE FOR 12 HOURS. 12 patch 5  . tiZANidine (ZANAFLEX) 4 MG tablet One by mouth every 12 hours  as needed for spasm 40 tablet 3  . topiramate (TOPAMAX) 100 MG tablet Take 2.5 tablets (250 mg total) by mouth 2 (two) times daily. 150 tablet 0  . traMADol (ULTRAM) 50 MG tablet Take 1 tablet (50 mg total) by mouth every 6 (six) hours as needed. 90 tablet 3  . valACYclovir (VALTREX) 1000 MG tablet Take 0.5 tablets (500 mg total) by mouth daily. 30 tablet 5  . venlafaxine XR (EFFEXOR-XR) 150 MG 24 hr capsule Take 1 capsule (150 mg total) by mouth 2 (two) times daily. 180 capsule 0   No current facility-administered medications for this visit.      Physical Exam  Blood pressure (!) 169/115, pulse 77, temperature (!) 95.7 F (35.4 C), height 4\' 11"  (1.499 m), weight 178 lb (80.7 kg).  Constitutional: overall normal  hygiene, normal nutrition, well developed, normal grooming, normal body habitus. Assistive device:none  Musculoskeletal: gait and station Limp none, muscle tone and strength are normal, no tremors or atrophy is present.  .  Neurological: coordination overall normal.  Deep tendon reflex/nerve stretch intact.  Sensation normal.  Cranial nerves II-XII intact.   Skin:   Normal overall no scars, lesions, ulcers or rashes. No psoriasis.  Psychiatric: Alert and oriented x 3.  Recent memory intact, remote memory unclear.  Normal mood and affect. Well groomed.  Good eye contact.  Cardiovascular: overall no swelling, no varicosities, no edema bilaterally, normal temperatures of the legs and arms, no clubbing, cyanosis and good capillary refill.  Lymphatic: palpation is normal.  Motion of both shoulders is tender and she prefers not to go through a full ROM secondary to the pains.  NV intact.  ROM of the neck is full  The patient has been educated about the nature of the problem(s) and counseled on treatment options.  The patient appeared to understand what I have discussed and is in agreement with it.  Encounter Diagnoses  Name Primary?  . Chronic right shoulder pain Yes  . Chronic left shoulder pain     PLAN Call if any problems.  Precautions discussed.  Continue current medications.   Return to clinic 1 month   Electronically Signed Sanjuana Kava, MD 8/9/20189:20 AM

## 2016-10-27 NOTE — Progress Notes (Signed)
Follow up.

## 2016-11-05 ENCOUNTER — Other Ambulatory Visit: Payer: Self-pay | Admitting: Family

## 2016-11-29 ENCOUNTER — Other Ambulatory Visit (HOSPITAL_COMMUNITY)
Admission: RE | Admit: 2016-11-29 | Discharge: 2016-11-29 | Disposition: A | Discharge status: Home / Self Care | Payer: Medicare Other | Source: Ambulatory Visit | Attending: Family Medicine | Admitting: Family Medicine

## 2016-11-29 ENCOUNTER — Ambulatory Visit (INDEPENDENT_AMBULATORY_CARE_PROVIDER_SITE_OTHER): Payer: Medicare Other | Admitting: Family Medicine

## 2016-11-29 ENCOUNTER — Encounter: Payer: Self-pay | Admitting: Family Medicine

## 2016-11-29 VITALS — Ht 59.0 in | Wt 187.3 lb

## 2016-11-29 DIAGNOSIS — I10 Essential (primary) hypertension: Secondary | ICD-10-CM

## 2016-11-29 DIAGNOSIS — Z01419 Encounter for gynecological examination (general) (routine) without abnormal findings: Secondary | ICD-10-CM

## 2016-11-29 DIAGNOSIS — Z124 Encounter for screening for malignant neoplasm of cervix: Secondary | ICD-10-CM | POA: Insufficient documentation

## 2016-11-29 DIAGNOSIS — Z30433 Encounter for removal and reinsertion of intrauterine contraceptive device: Secondary | ICD-10-CM | POA: Diagnosis not present

## 2016-11-29 DIAGNOSIS — A6004 Herpesviral vulvovaginitis: Secondary | ICD-10-CM

## 2016-11-29 DIAGNOSIS — L298 Other pruritus: Secondary | ICD-10-CM | POA: Insufficient documentation

## 2016-11-29 DIAGNOSIS — N184 Chronic kidney disease, stage 4 (severe): Secondary | ICD-10-CM

## 2016-11-29 DIAGNOSIS — N898 Other specified noninflammatory disorders of vagina: Secondary | ICD-10-CM

## 2016-11-29 MED ORDER — LEVONORGESTREL 18.6 MCG/DAY IU IUD
INTRAUTERINE_SYSTEM | Freq: Once | INTRAUTERINE | Status: AC
  Administered 2016-11-29: 12:00:00 via INTRAUTERINE

## 2016-11-29 MED ORDER — VALACYCLOVIR HCL 1 G PO TABS: 500.0000 mg | ORAL_TABLET | Freq: Every day | ORAL | 5 refills | Status: DC

## 2016-11-29 NOTE — Progress Notes (Signed)
Patient presents for AEX. IUD removal and reinsertion.  Wants PAP. Her neighbour is stressing her out. PHQ-9=14

## 2016-11-29 NOTE — Progress Notes (Signed)
Subjective:     [name] is a 51 y.o. female and is here for a comprehensive physical exam. The patient reports problems - aggravation with her neighbor. She is taking her BP meds. She has refused mammogram and further w/u by renal. Declines hemodialysis. Desires IUD change. Has no cycles with her IUD. Has had two years of hot flashes and night sweats. Had abnl pap and refused colposcopy last year.  Social History   Social History  . Marital status: Single    Spouse name: N/A  . Number of children: 0  . Years of education: 14   Occupational History  . Retired    Social History Main Topics  . Smoking status: Former Smoker    Packs/day: 0.25    Years: 4.00    Types: Cigarettes    Quit date: 03/21/1988  . Smokeless tobacco: Never Used  . Alcohol use Yes     Comment: social  . Drug use: No  . Sexual activity: No   Other Topics Concern  . Not on file   Social History Narrative   Fun: Puzzles, watch mystery shows, read   Denies religious beliefs effecting health care.    Health Maintenance  Topic Date Due  . TETANUS/TDAP  03/21/1985  . COLONOSCOPY  03/21/2016  . INFLUENZA VACCINE  10/19/2016  . MAMMOGRAM  03/10/2018  . PAP SMEAR  03/02/2019  . HIV Screening  Completed    The following portions of the patient's history were reviewed and updated as appropriate: allergies, current medications, past family history, past medical history, past social history, past surgical history and problem list.  Review of Systems Pertinent items noted in HPI and remainder of comprehensive ROS otherwise negative.   Objective:    Ht 4\' 11"  (1.499 m)   Wt 187 lb 4.8 oz (85 kg)   LMP  (LMP Unknown)   BMI 37.83 kg/m  General appearance: alert, cooperative and appears stated age Head: Normocephalic, without obvious abnormality, atraumatic Neck: no adenopathy, supple, symmetrical, trachea midline and thyroid not enlarged, symmetric, no tenderness/mass/nodules Lungs: clear to  auscultation bilaterally Breasts: normal appearance, no masses or tenderness Heart: regular rate and rhythm, S1, S2 normal, no murmur, click, rub or gallop Abdomen: soft, non-tender; bowel sounds normal; no masses,  no organomegaly Pelvic: cervix normal in appearance, external genitalia normal, no adnexal masses or tenderness, no cervical motion tenderness, uterus normal size, shape, and consistency and atrophic vagina, IUD strings noted Extremities: extremities normal, atraumatic, no cyanosis or edema Pulses: 2+ and symmetric Skin: Skin color, texture, turgor normal. No rashes or lesions Lymph nodes: Cervical, supraclavicular, and axillary nodes normal. Neurologic: Grossly normal  Procedure: Speculum placed inside vagina.  Cervix visualized.  Strings grasped with ring forceps.  IUD removed intact.  Procedure: Cervix cleaned with Betadine x 2.  Grasped anteriourly with a single tooth tenaculum.  Uterus sounded to 8 cm.  Liletta IUD placed per manufacturer's recommendations.  Strings trimmed to 3 cm.  Assessment:   GYN female exam.      Plan:      Problem List Items Addressed This Visit      Unprioritized   Essential hypertension, benign    F/u with PCP about BP control      Chronic kidney disease, stage IV (severe) (HCC)   Herpes simplex vulvovaginitis    Refilled her Valtrex      Relevant Medications   valACYclovir (VALTREX) 1000 MG tablet    Other Visit Diagnoses    Encounter  for IUD removal and reinsertion    -  Primary   Relevant Medications   levonorgestrel (LILETTA) 18.6 MCG/DAY IUD (Completed)   Screening for malignant neoplasm of cervix       Relevant Orders   Cytology - PAP   Encounter for gynecological examination without abnormal finding       Vagina itching       Relevant Orders   Cervicovaginal ancillary only      See After Visit Summary for Counseling Recommendations

## 2016-11-29 NOTE — Assessment & Plan Note (Signed)
Refilled her Valtrex

## 2016-11-29 NOTE — Patient Instructions (Signed)
Preventive Care 40-64 Years, Female Preventive care refers to lifestyle choices and visits with your health care provider that can promote health and wellness. What does preventive care include?  A yearly physical exam. This is also called an annual well check.  Dental exams once or twice a year.  Routine eye exams. Ask your health care provider how often you should have your eyes checked.  Personal lifestyle choices, including: ? Daily care of your teeth and gums. ? Regular physical activity. ? Eating a healthy diet. ? Avoiding tobacco and drug use. ? Limiting alcohol use. ? Practicing safe sex. ? Taking low-dose aspirin daily starting at age 58. ? Taking vitamin and mineral supplements as recommended by your health care provider. What happens during an annual well check? The services and screenings done by your health care provider during your annual well check will depend on your age, overall health, lifestyle risk factors, and family history of disease. Counseling Your health care provider may ask you questions about your:  Alcohol use.  Tobacco use.  Drug use.  Emotional well-being.  Home and relationship well-being.  Sexual activity.  Eating habits.  Work and work Statistician.  Method of birth control.  Menstrual cycle.  Pregnancy history.  Screening You may have the following tests or measurements:  Height, weight, and BMI.  Blood pressure.  Lipid and cholesterol levels. These may be checked every 5 years, or more frequently if you are over 81 years old.  Skin check.  Lung cancer screening. You may have this screening every year starting at age 78 if you have a 30-pack-year history of smoking and currently smoke or have quit within the past 15 years.  Fecal occult blood test (FOBT) of the stool. You may have this test every year starting at age 65.  Flexible sigmoidoscopy or colonoscopy. You may have a sigmoidoscopy every 5 years or a colonoscopy  every 10 years starting at age 30.  Hepatitis C blood test.  Hepatitis B blood test.  Sexually transmitted disease (STD) testing.  Diabetes screening. This is done by checking your blood sugar (glucose) after you have not eaten for a while (fasting). You may have this done every 1-3 years.  Mammogram. This may be done every 1-2 years. Talk to your health care provider about when you should start having regular mammograms. This may depend on whether you have a family history of breast cancer.  BRCA-related cancer screening. This may be done if you have a family history of breast, ovarian, tubal, or peritoneal cancers.  Pelvic exam and Pap test. This may be done every 3 years starting at age 80. Starting at age 36, this may be done every 5 years if you have a Pap test in combination with an HPV test.  Bone density scan. This is done to screen for osteoporosis. You may have this scan if you are at high risk for osteoporosis.  Discuss your test results, treatment options, and if necessary, the need for more tests with your health care provider. Vaccines Your health care provider may recommend certain vaccines, such as:  Influenza vaccine. This is recommended every year.  Tetanus, diphtheria, and acellular pertussis (Tdap, Td) vaccine. You may need a Td booster every 10 years.  Varicella vaccine. You may need this if you have not been vaccinated.  Zoster vaccine. You may need this after age 5.  Measles, mumps, and rubella (MMR) vaccine. You may need at least one dose of MMR if you were born in  1957 or later. You may also need a second dose.  Pneumococcal 13-valent conjugate (PCV13) vaccine. You may need this if you have certain conditions and were not previously vaccinated.  Pneumococcal polysaccharide (PPSV23) vaccine. You may need one or two doses if you smoke cigarettes or if you have certain conditions.  Meningococcal vaccine. You may need this if you have certain  conditions.  Hepatitis A vaccine. You may need this if you have certain conditions or if you travel or work in places where you may be exposed to hepatitis A.  Hepatitis B vaccine. You may need this if you have certain conditions or if you travel or work in places where you may be exposed to hepatitis B.  Haemophilus influenzae type b (Hib) vaccine. You may need this if you have certain conditions.  Talk to your health care provider about which screenings and vaccines you need and how often you need them. This information is not intended to replace advice given to you by your health care provider. Make sure you discuss any questions you have with your health care provider. Document Released: 04/03/2015 Document Revised: 11/25/2015 Document Reviewed: 01/06/2015 Elsevier Interactive Patient Education  2017 [employer].

## 2016-11-29 NOTE — Assessment & Plan Note (Signed)
F/u with PCP about BP control

## 2016-11-30 ENCOUNTER — Encounter: Payer: Self-pay | Admitting: Orthopaedic Surgery

## 2016-11-30 ENCOUNTER — Ambulatory Visit (INDEPENDENT_AMBULATORY_CARE_PROVIDER_SITE_OTHER): Payer: Medicare Other | Admitting: Orthopaedic Surgery

## 2016-11-30 VITALS — BP 150/99 | HR 68 | Temp 97.4°F | Ht 59.0 in | Wt 185.0 lb

## 2016-11-30 DIAGNOSIS — M25512 Pain in left shoulder: Secondary | ICD-10-CM

## 2016-11-30 DIAGNOSIS — M25511 Pain in right shoulder: Secondary | ICD-10-CM

## 2016-11-30 DIAGNOSIS — G8929 Other chronic pain: Secondary | ICD-10-CM | POA: Diagnosis not present

## 2016-11-30 LAB — CERVICOVAGINAL ANCILLARY ONLY
Bacterial vaginitis: NEGATIVE
Candida vaginitis: POSITIVE — AB
Trichomonas: NEGATIVE

## 2016-11-30 NOTE — Progress Notes (Signed)
Patient [name], female DOB:Feb 02, 1966, 51 y.o. XBM:[id number]  Chief Complaint  Patient presents with  . Follow-up    shoulder pain    HPI  [name] is a 51 y.o. female who has chronic pain of both shoulders. She is better after the change in her Toradol doses.  She has less pain and more motion.  She has no new trauma, no weakness.  HPI  Body mass index is 37.37 kg/m.  ROS  Review of Systems  HENT: Negative for congestion.   Respiratory: Negative for cough and shortness of breath.   Cardiovascular: Negative for chest pain and leg swelling.  Endocrine: Positive for cold intolerance.  Musculoskeletal: Positive for arthralgias.  Allergic/Immunologic: Positive for environmental allergies.    Past Medical History:  Diagnosis Date  . Anemia   . Anxiety   . Bipolar disorder ([place])   . Depression   . History of blood transfusion   . Hypertension   . IUD    HISTORY OF IUD --REMOVED IN 2006  . Seizures ([place]) [id number]   two seizures due to a med changes    Past Surgical History:  Procedure Laterality Date  . CARPAL TUNNEL RELEASE Right 07/08/2014   Procedure: RIGHT CARPAL TUNNEL RELEASE;  Surgeon: Sanjuana Kava, MD;  Location: [facility];  Service: Orthopedics;  Laterality: Right;  . CHOLECYSTECTOMY    . GASTRIC BYPASS  2000  . HYSTEROSCOPY W/D&C  11/14/2011   Procedure: DILATATION AND CURETTAGE /HYSTEROSCOPY;  Surgeon: Marylynn Pearson, MD;  Location: [facility];  Service: Gynecology;;  with removal of polyps  . INTRAUTERINE DEVICE (IUD) INSERTION  03/21/2010  . PANNICULECTOMY    . ROTATOR CUFF REPAIR      Family History  Problem Relation Age of Onset  . Hypertension Mother   . Kidney failure Mother   . Hypertension Father   . Diabetes Cousin   . Diabetes Sister   . Hypertension Sister   . Hypertension Brother   . Hypertension Maternal Aunt   . Hypertension Maternal Uncle   . Hypertension Maternal Grandmother   . Hypertension Maternal Grandfather   .  Hypertension Paternal Grandmother   . Hypertension Paternal Grandfather     Social History Social History  Substance Use Topics  . Smoking status: Former Smoker    Packs/day: 0.25    Years: 4.00    Types: Cigarettes    Quit date: 03/21/1988  . Smokeless tobacco: Never Used  . Alcohol use Yes     Comment: social    Allergies  Allergen Reactions  . Ambien [Zolpidem Tartrate]   . Lactulose Itching  . Ondansetron Nausea And Vomiting  . Quetiapine     Other reaction(s): Other (See Comments) Possible Hair Loss  . Sulfa Antibiotics Rash    Current Outpatient Prescriptions  Medication Sig Dispense Refill  . carvedilol (COREG) 12.5 MG tablet TAKE (1) TABLET BY MOUTH TWICE DAILY WITH A MEAL. 180 tablet 0  . diazepam (VALIUM) 10 MG tablet Take 1 tablet (10 mg total) by mouth 4 (four) times daily. 120 tablet 0  . furosemide (LASIX) 20 MG tablet Take 1 tablet (20 mg total) by mouth daily as needed for fluid. 30 tablet 1  . lidocaine (LIDODERM) 5 % APPLY 1 PATCH TO THE SKIN DAILY FOR 12 HOURS THEN REMOVE FOR 12 HOURS. 12 patch 5  . tiZANidine (ZANAFLEX) 4 MG tablet One by mouth every 12 hours as needed for spasm 40 tablet 3  . topiramate (TOPAMAX) 100 MG tablet  TAKE 2 AND 1/2 TABLETS(250 MG) BY MOUTH TWICE DAILY 150 tablet 0  . traMADol (ULTRAM) 50 MG tablet Take 1 tablet (50 mg total) by mouth every 6 (six) hours as needed. 90 tablet 3  . valACYclovir (VALTREX) 1000 MG tablet Take 0.5 tablets (500 mg total) by mouth daily. 30 tablet 5  . venlafaxine XR (EFFEXOR-XR) 150 MG 24 hr capsule Take 1 capsule (150 mg total) by mouth 2 (two) times daily. 180 capsule 0   No current facility-administered medications for this visit.      Physical Exam  Blood pressure (!) 150/99, pulse 68, temperature (!) 97.4 F (36.3 C), height 4\' 11"  (1.499 m), weight 185 lb (83.9 kg).  Constitutional: overall normal hygiene, normal nutrition, well developed, normal grooming, normal body habitus. Assistive  device:none  Musculoskeletal: gait and station Limp none, muscle tone and strength are normal, no tremors or atrophy is present.  .  Neurological: coordination overall normal.  Deep tendon reflex/nerve stretch intact.  Sensation normal.  Cranial nerves II-XII intact.   Skin:   Normal overall no scars, lesions, ulcers or rashes. No psoriasis.  Psychiatric: Alert and oriented x 3.  Recent memory intact, remote memory unclear.  Normal mood and affect. Well groomed.  Good eye contact.  Cardiovascular: overall no swelling, no varicosities, no edema bilaterally, normal temperatures of the legs and arms, no clubbing, cyanosis and good capillary refill.  Lymphatic: palpation is normal.  All other systems reviewed and are negative   Both shoulders have full motion but tenderness in the extremes.  NV intact.  ROM of the neck is full.  The patient has been educated about the nature of the problem(s) and counseled on treatment options.  The patient appeared to understand what I have discussed and is in agreement with it.  Encounter Diagnoses  Name Primary?  . Chronic right shoulder pain Yes  . Chronic left shoulder pain     PLAN Call if any problems.  Precautions discussed.  Continue current medications.   Return to clinic 3 months   Electronically Signed Sanjuana Kava, MD 9/12/201811:34 AM

## 2016-12-01 ENCOUNTER — Other Ambulatory Visit: Payer: Self-pay | Admitting: *Deleted

## 2016-12-01 DIAGNOSIS — B379 Candidiasis, unspecified: Secondary | ICD-10-CM

## 2016-12-01 LAB — CYTOLOGY - PAP
Diagnosis: NEGATIVE
HPV: NOT DETECTED

## 2016-12-01 MED ORDER — FLUCONAZOLE 150 MG PO TABS: 150.0000 mg | ORAL_TABLET | Freq: Every day | ORAL | 2 refills | Status: DC

## 2016-12-01 NOTE — Progress Notes (Signed)
Diflucan sent per Dr Kennon Rounds order. See lab note.

## 2016-12-21 ENCOUNTER — Encounter: Payer: Self-pay | Admitting: *Deleted

## 2016-12-27 ENCOUNTER — Other Ambulatory Visit: Payer: Self-pay | Admitting: Internal Medicine

## 2016-12-27 ENCOUNTER — Other Ambulatory Visit: Payer: Self-pay | Admitting: Family

## 2016-12-27 DIAGNOSIS — F3160 Bipolar disorder, current episode mixed, unspecified: Secondary | ICD-10-CM

## 2017-01-12 ENCOUNTER — Telehealth: Payer: Self-pay | Admitting: Orthopaedic Surgery

## 2017-01-12 NOTE — Telephone Encounter (Signed)
Patient called, states that a prescription for "muscle relaxer" was to be prescribed at last office visit. Pharmacy is [facility] on [address], [place], ph#[phone]/fax [phone].  Patient ph# [phone]

## 2017-01-12 NOTE — Telephone Encounter (Signed)
No

## 2017-01-13 NOTE — Telephone Encounter (Signed)
Called patient, left voice message to notify.

## 2017-01-16 DIAGNOSIS — R197 Diarrhea, unspecified: Secondary | ICD-10-CM | POA: Diagnosis not present

## 2017-01-16 DIAGNOSIS — R1084 Generalized abdominal pain: Secondary | ICD-10-CM | POA: Diagnosis not present

## 2017-01-16 DIAGNOSIS — R55 Syncope and collapse: Secondary | ICD-10-CM | POA: Diagnosis not present

## 2017-01-23 ENCOUNTER — Encounter (HOSPITAL_COMMUNITY): Payer: Self-pay | Admitting: Family Medicine

## 2017-01-23 ENCOUNTER — Emergency Department (HOSPITAL_COMMUNITY): Payer: Medicare Other

## 2017-01-23 ENCOUNTER — Emergency Department (HOSPITAL_COMMUNITY)
Admission: EM | Admit: 2017-01-23 | Discharge: 2017-01-23 | Disposition: A | Discharge status: Home / Self Care | Payer: Medicare Other | Attending: Emergency Medicine | Admitting: Emergency Medicine

## 2017-01-23 DIAGNOSIS — N185 Chronic kidney disease, stage 5: Secondary | ICD-10-CM | POA: Diagnosis not present

## 2017-01-23 DIAGNOSIS — R55 Syncope and collapse: Secondary | ICD-10-CM | POA: Insufficient documentation

## 2017-01-23 DIAGNOSIS — R51 Headache: Secondary | ICD-10-CM | POA: Diagnosis not present

## 2017-01-23 DIAGNOSIS — Z87891 Personal history of nicotine dependence: Secondary | ICD-10-CM | POA: Diagnosis not present

## 2017-01-23 DIAGNOSIS — R42 Dizziness and giddiness: Secondary | ICD-10-CM | POA: Diagnosis not present

## 2017-01-23 DIAGNOSIS — R110 Nausea: Secondary | ICD-10-CM

## 2017-01-23 DIAGNOSIS — R531 Weakness: Secondary | ICD-10-CM | POA: Diagnosis not present

## 2017-01-23 DIAGNOSIS — K439 Ventral hernia without obstruction or gangrene: Secondary | ICD-10-CM | POA: Diagnosis not present

## 2017-01-23 DIAGNOSIS — R61 Generalized hyperhidrosis: Secondary | ICD-10-CM | POA: Insufficient documentation

## 2017-01-23 DIAGNOSIS — I120 Hypertensive chronic kidney disease with stage 5 chronic kidney disease or end stage renal disease: Secondary | ICD-10-CM | POA: Insufficient documentation

## 2017-01-23 DIAGNOSIS — R1084 Generalized abdominal pain: Secondary | ICD-10-CM | POA: Insufficient documentation

## 2017-01-23 DIAGNOSIS — R109 Unspecified abdominal pain: Secondary | ICD-10-CM | POA: Diagnosis present

## 2017-01-23 DIAGNOSIS — Z79899 Other long term (current) drug therapy: Secondary | ICD-10-CM | POA: Diagnosis not present

## 2017-01-23 DIAGNOSIS — S0990XA Unspecified injury of head, initial encounter: Secondary | ICD-10-CM | POA: Diagnosis not present

## 2017-01-23 LAB — CBC
HCT: 28.5 % — ABNORMAL LOW (ref 36.0–46.0)
Hemoglobin: 8.2 g/dL — ABNORMAL LOW (ref 12.0–15.0)
MCH: 22.1 pg — ABNORMAL LOW (ref 26.0–34.0)
MCHC: 28.8 g/dL — ABNORMAL LOW (ref 30.0–36.0)
MCV: 76.8 fL — ABNORMAL LOW (ref 78.0–100.0)
Platelets: 162 10*3/uL (ref 150–400)
RBC: 3.71 MIL/uL — ABNORMAL LOW (ref 3.87–5.11)
RDW: 18.4 % — ABNORMAL HIGH (ref 11.5–15.5)
WBC: 3.9 10*3/uL — ABNORMAL LOW (ref 4.0–10.5)

## 2017-01-23 LAB — COMPREHENSIVE METABOLIC PANEL
ALT: 10 U/L — ABNORMAL LOW (ref 14–54)
AST: 17 U/L (ref 15–41)
Albumin: 3.6 g/dL (ref 3.5–5.0)
Alkaline Phosphatase: 178 U/L — ABNORMAL HIGH (ref 38–126)
Anion gap: 8 (ref 5–15)
BUN: 21 mg/dL — ABNORMAL HIGH (ref 6–20)
CO2: 23 mmol/L (ref 22–32)
Calcium: 8.2 mg/dL — ABNORMAL LOW (ref 8.9–10.3)
Chloride: 113 mmol/L — ABNORMAL HIGH (ref 101–111)
Creatinine, Ser: 1.87 mg/dL — ABNORMAL HIGH (ref 0.44–1.00)
GFR calc Af Amer: 35 mL/min — ABNORMAL LOW (ref >60)
GFR calc non Af Amer: 30 mL/min — ABNORMAL LOW (ref >60)
Glucose, Bld: 91 mg/dL (ref 65–99)
Potassium: 3.9 mmol/L (ref 3.5–5.1)
Sodium: 144 mmol/L (ref 135–145)
Total Bilirubin: 0.7 mg/dL (ref 0.3–1.2)
Total Protein: 6.7 g/dL (ref 6.5–8.1)

## 2017-01-23 LAB — URINALYSIS, ROUTINE W REFLEX MICROSCOPIC
Bilirubin Urine: NEGATIVE
Glucose, UA: NEGATIVE mg/dL
Hgb urine dipstick: NEGATIVE
Ketones, ur: NEGATIVE mg/dL
Leukocytes, UA: NEGATIVE
Nitrite: NEGATIVE
Protein, ur: >=300 mg/dL — AB
Specific Gravity, Urine: 1.019 (ref 1.005–1.030)
pH: 5 (ref 5.0–8.0)

## 2017-01-23 LAB — I-STAT TROPONIN, ED: Troponin i, poc: 0.02 ng/mL (ref 0.00–0.08)

## 2017-01-23 LAB — LIPASE, BLOOD: Lipase: 49 U/L (ref 11–51)

## 2017-01-23 LAB — POC OCCULT BLOOD, ED: Fecal Occult Bld: NEGATIVE

## 2017-01-23 MED ORDER — PROMETHAZINE HCL 25 MG PO TABS: 25.0000 mg | ORAL_TABLET | Freq: Four times a day (QID) | ORAL | 0 refills | Status: DC | PRN

## 2017-01-23 MED ORDER — CARVEDILOL 12.5 MG PO TABS
12.5000 mg | ORAL_TABLET | Freq: Once | ORAL | Status: DC
  Filled 2017-01-23: qty 1

## 2017-01-23 NOTE — ED Provider Notes (Signed)
[facility] DEPT Provider Note   CSN: [id number] Arrival date & time: 01/23/17  1207     History   Chief Complaint Chief Complaint  Patient presents with  . Abdominal Pain  . Dizziness    HPI [name] is a 51 y.o. female with a history of HTN, bariatric surgery in 2000, anemia requiring blood transfusion, proteinuria who presents to the emergency department with multiple complaints.  The patient reports worsening generalized weakness and fatigue over the last month.  She reports an unwitnessed syncopal episode last week.  She reports that she was rising from a sitting position when she felt lightheaded, as if she might pass out, when she blacked out and woke up in the floor.  She is unsure how long the episode lasted, but states that she was covered in stool when she awoke.  She reports that when she was finally able to try and stand up that she immediately felt lightheaded again and had a second episode.  No recurrent episodes since that time.  She did not seek medical evaluation following the episodes.  She reports several episodes of fecal incontinence since the syncopal episode and was evaluated last week by her GI, Dr. Benson Norway, where she states they discussed her h/o IBS.   She also states that she has a history of chronic abdominal pain that radiates to her bilateral mid-back, but reports worsening pain over the last few weeks.  She describes the pain as constantly achy, but becomes sharp in her bilateral upper abdomen when she sits upright.  She denies dysuria, frequency, urgency, vaginal pain, bleeding, discharge, or itching.  She also presents with worsening, intermittent bilateral non-radiating frontal headaches over the last week with nausea x2 days with increased sensitivity to smell. She reports a family h/o of brain aneurysms. She denies emesis, fever, chills, or neck pain.  She reports that she has been having night sweats roughly 3 nights a  week for an extended, unknown amount of time.  She states that she came for evaluation today because "something doesn't feel right and it keeps getting worse. The last time I felt like this I needed a blood transfusion." She reports one episode of right-sided CP that began while she was sitting in the lobby of the ED that resolved spontaneously.  She reports a h/o of similar. She denied dyspnea, palpitations, numbness, or weakness.    She reports she has been compliant with her medications for hypertension. She does not take any blood thinners.   The history is provided by the patient. No language interpreter was used.  Abdominal Pain   Pertinent negatives include fever, constipation, dysuria, frequency, hematuria, arthralgias and myalgias.  Loss of Consciousness   Associated symptoms include abdominal pain, back pain and light-headedness. Pertinent negatives include congestion and fever.    Past Medical History:  Diagnosis Date  . Anemia   . Anxiety   . Bipolar disorder ([place])   . Depression   . History of blood transfusion   . Hypertension   . IUD    HISTORY OF IUD --REMOVED IN 2006  . Seizures ([place]) [id number]   two seizures due to a med changes    Patient Active Problem List   Diagnosis Date Noted  . Right sided abdominal pain 08/19/2016  . LGSIL on Pap smear of cervix 04/26/2016  . Vaginal discharge 04/26/2016  . Herpes simplex vulvovaginitis 03/01/2016  . Lower extremity edema 07/20/2015  . Chronic kidney disease, stage IV (severe) (  [place]) 05/14/2015  . Proteinuria 05/14/2015  . H/O gastric bypass 01/21/2015  . Generalized headaches 01/05/2015  . Weight gain following gastric bypass surgery 08/13/2014  . Deficiency anemia 01/06/2014  . Iron deficiency anemia 01/06/2014  . Vitamin B12 deficiency (dietary) anemia 01/06/2014  . Essential hypertension, benign 01/25/2013  . Bipolar disorder [facility])     Past Surgical History:  Procedure Laterality Date  . CHOLECYSTECTOMY      . GASTRIC BYPASS  2000  . INTRAUTERINE DEVICE (IUD) INSERTION  03/21/2010  . LAPAROSCOPIC GASTROTOMY W/ REPAIR OF ULCER    . PANNICULECTOMY    . ROTATOR CUFF REPAIR      OB History    Gravida Para Term Preterm AB Living   1       1 0   SAB TAB Ectopic Multiple Live Births   1               Home Medications    Prior to Admission medications   Medication Sig Start Date End Date Taking? Authorizing Provider  acetaminophen (ACETAMINOPHEN 8 HOUR) 650 MG CR tablet Take 650 mg daily as needed by mouth for pain.   Yes [provider]  carvedilol (COREG) 12.5 MG tablet TAKE (1) TABLET BY MOUTH TWICE DAILY WITH A MEAL. 09/29/16  Yes [name], FNP  diazepam (VALIUM) 10 MG tablet TAKE 1 TABLET BY MOUTH THREE TIMES DAILY 12/28/16  Yes [name], FNP  furosemide (LASIX) 20 MG tablet Take 1 tablet (20 mg total) by mouth daily as needed for fluid. 08/18/16  Yes [name], FNP  lidocaine (LIDODERM) 5 % APPLY 1 PATCH TO THE SKIN DAILY FOR 12 HOURS THEN REMOVE FOR 12 HOURS. 01/20/16  Yes Sanjuana Kava, MD  Multiple Vitamins-Minerals (WOMENS DAILY FORMULA PO) Take 1 tablet daily by mouth.   Yes [provider]  tiZANidine (ZANAFLEX) 4 MG tablet One by mouth every 12 hours as needed for spasm 10/27/16  Yes Sanjuana Kava, MD  topiramate (TOPAMAX) 100 MG tablet TAKE 2 AND 1/2 TABLETS(250 MG) BY MOUTH TWICE DAILY 12/28/16  Yes [name], FNP  traMADol (ULTRAM) 50 MG tablet Take 1 tablet (50 mg total) by mouth every 6 (six) hours as needed. 10/27/16  Yes Sanjuana Kava, MD  valACYclovir (VALTREX) 1000 MG tablet Take 0.5 tablets (500 mg total) by mouth daily. 11/29/16  Yes Donnamae Jude, MD  venlafaxine XR (EFFEXOR-XR) 150 MG 24 hr capsule Take 1 capsule (150 mg total) by mouth 2 (two) times daily. 09/29/16  Yes [name], FNP  promethazine (PHENERGAN) 25 MG tablet Take 1 tablet (25 mg total) every 6 (six) hours as needed by mouth for nausea or vomiting.  01/23/17   McDonald, Laymond Purser, PA-C    Family History Family History  Problem Relation Age of Onset  . Hypertension Mother   . Kidney failure Mother   . Hypertension Father   . Diabetes Cousin   . Diabetes Sister   . Hypertension Sister   . Hypertension Brother   . Hypertension Maternal Aunt   . Hypertension Maternal Uncle   . Hypertension Maternal Grandmother   . Hypertension Maternal Grandfather   . Hypertension Paternal Grandmother   . Hypertension Paternal Grandfather     Social History Social History   Tobacco Use  . Smoking status: Former Smoker    Packs/day: 0.25    Years: 4.00    Pack years: 1.00    Types: Cigarettes    Last  attempt to quit: 03/21/1988    Years since quitting: 28.8  . Smokeless tobacco: Never Used  Substance Use Topics  . Alcohol use: Yes    Comment: 1-2 times a year.   . Drug use: No     Allergies   Ambien [zolpidem tartrate]; Lactulose; Ondansetron; Quetiapine; and Sulfa antibiotics   Review of Systems Review of Systems  Constitutional: Negative for chills and fever.       Night sweats  HENT: Negative for congestion and ear pain.   Eyes: Negative for visual disturbance.  Respiratory: Negative for cough and wheezing.   Gastrointestinal: Positive for abdominal pain. Negative for abdominal distention, anal bleeding, constipation and rectal pain.  Genitourinary: Negative for dysuria, frequency, hematuria, vaginal bleeding, vaginal discharge and vaginal pain.  Musculoskeletal: Positive for back pain. Negative for arthralgias, gait problem, joint swelling, myalgias, neck pain and neck stiffness.  Neurological: Positive for syncope and light-headedness.   Physical Exam Updated Vital Signs BP (!) 199/104 (BP Location: Right Arm)   Pulse (!) 59   Temp 97.6 F (36.4 C) (Oral)   Resp 14   Ht 4\' 11"  (1.499 m)   Wt 77.1 kg (170 lb)   SpO2 100%   BMI 34.34 kg/m   Physical Exam  Constitutional: No distress.  HENT:  Head: Normocephalic.    Eyes: Conjunctivae are normal.  Neck: Neck supple.  No meningeal signs.  Cardiovascular: Normal rate and regular rhythm. Exam reveals no gallop and no friction rub.  No murmur heard. Pulmonary/Chest: Effort normal. No respiratory distress.  Abdominal: Soft. Bowel sounds are normal. She exhibits no distension. There is tenderness. There is guarding and CVA tenderness. There is no rigidity and no rebound.  Diffusely tenderness to palpation throughout the entire abdomen and over the bilateral costovertebral angles with guarding. The abdomen is soft, no distention. No peritoneal signs.  Neurological: She is alert. She has normal strength. No cranial nerve deficit or sensory deficit. GCS eye subscore is 4. GCS verbal subscore is 5. GCS motor subscore is 6.  Cranial nerves II through XII grossly intact.   Skin: Skin is warm. No rash noted.  Psychiatric: Her behavior is normal.  Nursing note and vitals reviewed.    ED Treatments / Results  Labs (all labs ordered are listed, but only abnormal results are displayed) Labs Reviewed  COMPREHENSIVE METABOLIC PANEL - Abnormal; Notable for the following components:      Result Value   Chloride 113 (*)    BUN 21 (*)    Creatinine, Ser 1.87 (*)    Calcium 8.2 (*)    ALT 10 (*)    Alkaline Phosphatase 178 (*)    GFR calc non Af Amer 30 (*)    GFR calc Af Amer 35 (*)    All other components within normal limits  CBC - Abnormal; Notable for the following components:   WBC 3.9 (*)    RBC 3.71 (*)    Hemoglobin 8.2 (*)    HCT 28.5 (*)    MCV 76.8 (*)    MCH 22.1 (*)    MCHC 28.8 (*)    RDW 18.4 (*)    All other components within normal limits  URINALYSIS, ROUTINE W REFLEX MICROSCOPIC - Abnormal; Notable for the following components:   Protein, ur >=300 (*)    Bacteria, UA RARE (*)    Squamous Epithelial / LPF 0-5 (*)    All other components within normal limits  LIPASE, BLOOD  I-STAT TROPONIN, ED  POC OCCULT BLOOD, ED    EKG  EKG  Interpretation  Date/Time:  Monday January 23 2017 20:09:21 EST Ventricular Rate:  59 PR Interval:    QRS Duration: 90 QT Interval:  459 QTC Calculation: 455 R Axis:   -8 Text Interpretation:  Sinus rhythm Inferior infarct, age indeterminate Anterolateral infarct, age indeterminate Confirmed by Isla Pence [id number]) on 01/23/2017 9:08:38 PM Also confirmed by Isla Pence [phone]), editor Laurena Spies [phone])  on 01/24/2017 8:58:42 AM       Radiology Ct Abdomen Pelvis Wo Contrast  Result Date: 01/23/2017 CLINICAL DATA:  Syncopal episode EXAM: CT ABDOMEN AND PELVIS WITHOUT CONTRAST TECHNIQUE: Multidetector CT imaging of the abdomen and pelvis was performed following the standard protocol without IV contrast. COMPARISON:  08/31/2016, 03/07/2015 FINDINGS: Lower chest: Mild cardiomegaly. No consolidation or pleural effusion. Trace pericardial effusion Hepatobiliary: No focal liver abnormality is seen. Status post cholecystectomy. No biliary dilatation. Pancreas: Unremarkable. No pancreatic ductal dilatation or surrounding inflammatory changes. Spleen: Normal in size without focal abnormality. Adrenals/Urinary Tract: Adrenal glands are within normal limits. No hydronephrosis. Stable small exophytic cyst upper pole left kidney. Bladder unremarkable Stomach/Bowel: Status post gastric bypass. No evidence for a bowel obstruction. No colon wall thickening. Normal appendix. Vascular/Lymphatic: Nonaneurysmal aorta. Nonspecific subcentimeter periaortic lymph nodes. Reproductive: Intrauterine device noted.  No adnexal mass Other: Trace free fluid in the pelvis. No free air. Generalized subcutaneous edema. Small fat in the umbilicus. Fat containing ventral hernia. Musculoskeletal: Degenerative changes of the spine. No acute or suspicious lesion IMPRESSION: 1. Postsurgical changes of the stomach and small bowel. No evidence for a bowel obstruction 2. Mild cardiomegaly 3. No acute intra-abdominal or pelvic  abnormality 4. Stable irregular fat containing ventral hernia 5. Mild generalized subcutaneous edema Electronically Signed   By: Donavan Foil M.D.   On: 01/23/2017 20:56   Ct Head Wo Contrast  Result Date: 01/23/2017 CLINICAL DATA:  Head trauma and headache EXAM: CT HEAD WITHOUT CONTRAST TECHNIQUE: Contiguous axial images were obtained from the base of the skull through the vertex without intravenous contrast. COMPARISON:  Head CT 01/19/2015 FINDINGS: Brain: No mass lesion, intraparenchymal hemorrhage or extra-axial collection. No evidence of acute cortical infarct. There is periventricular hypoattenuation compatible with chronic microvascular disease. Vascular: No hyperdense vessel or unexpected calcification. Skull: Normal visualized skull base, calvarium and extracranial soft tissues. Sinuses/Orbits: No sinus fluid levels or advanced mucosal thickening. No mastoid effusion. Normal orbits. IMPRESSION: Unchanged white matter disease without acute abnormality. Electronically Signed   By: Ulyses Jarred M.D.   On: 01/23/2017 20:50    Procedures Procedures (including critical care time)  Medications Ordered in ED Medications - No data to display   Initial Impression / Assessment and Plan / ED Course  I have reviewed the triage vital signs and the nursing notes.  Pertinent labs & imaging results that were available during my care of the patient were reviewed by me and considered in my medical decision making (see chart for details).     51 year old female presenting with a syncopal episode last week, chronic abdominal pain, and generalized weakness x1 month. The patient was discussed and evaluated with Dr. Gilford Raid, attending physician.   Patient is nontoxic, nonseptic appearing, in no apparent distress.  Patient's pain and other symptoms adequately managed in emergency department. Labs, imaging and vitals reviewed.  Patient does not meet the SIRS or Sepsis criteria.  Troponin is negative. EKG  with NSR, no signs of WPW or Brugada. On repeat exam patient does not have  a surgical abdomen and there are no peritoneal signs.  No indication of appendicitis, bowel obstruction, bowel perforation, cholecystitis, diverticulitis, or PID. The patient does not appear clinically dehydrated. Will withhold fluids at this time since the patient's BP is elevated. Evening dose of Coreg given in the ED. No signs of end organ damage on labs or imaging CT head is unremarkable for bleed or acute processes. The patient is established with Dr. Benson Norway in GI. I question if the patient's abdominal pain and nausea are related to her previous bariatric surgery and/or nutritional deficiency. Patient discharged home with symptomatic treatment and given strict instructions for follow-up with their primary care physician or GI.  Will also provide the patient with a referral to Cardiology given the syncopal episode for possible holter monitor.I have also discussed reasons to return immediately to the ER. Patient expresses understanding and agrees with plan.  Final Clinical Impressions(s) / ED Diagnoses   Final diagnoses:  Syncope, unspecified syncope type  Generalized abdominal pain  Nausea    ED Discharge Orders        Ordered    promethazine (PHENERGAN) 25 MG tablet  Every 6 hours PRN     01/23/17 2204       Joline Maxcy A, PA-C 01/25/17 1804    Isla Pence, MD 01/31/17 [phone]

## 2017-01-23 NOTE — ED Triage Notes (Signed)
Patient reports she had a syncopal episode last week and didn't inquire with medical assistance. Patient is complaining of generalized abd pain with nausea, vomiting, and feeling "whoozy" or lightheaded. Patient was ambulatory to triage and drove herself to the emergency  department.

## 2017-01-23 NOTE — ED Notes (Signed)
RN attempted to start an IV in the left [facility] but the vein was unable to hold an IV. Blood was able to be collected for I-stat

## 2017-01-23 NOTE — ED Notes (Signed)
Patient transported to CT 

## 2017-01-23 NOTE — Discharge Instructions (Signed)
Take phenergan once every 6 hours as needed for nausea.  Your workup for the syncopal episode (passing out) that happened last week did not show any specific cause of what happened, but you can call to get established with cardiology to follow up.  Please follow up with GI or your primary care provider if your nausea and symptoms persist. Please have your primary care provider follow up with the elevated protein in your urine.  If you develop new or worsening symptoms, including if you pass out again, if you develop a high fever, or severe vomiting, please return to the Emergency Department for re-evaluation.

## 2017-02-01 ENCOUNTER — Other Ambulatory Visit: Payer: Self-pay | Admitting: Internal Medicine

## 2017-02-01 ENCOUNTER — Emergency Department (HOSPITAL_COMMUNITY)
Admission: EM | Admit: 2017-02-01 | Discharge: 2017-02-01 | Disposition: A | Discharge status: Home / Self Care | Payer: Medicare Other | Attending: Emergency Medicine | Admitting: Emergency Medicine

## 2017-02-01 ENCOUNTER — Other Ambulatory Visit: Payer: Self-pay

## 2017-02-01 ENCOUNTER — Emergency Department (HOSPITAL_COMMUNITY): Payer: Medicare Other

## 2017-02-01 ENCOUNTER — Encounter (HOSPITAL_COMMUNITY): Payer: Self-pay | Admitting: Emergency Medicine

## 2017-02-01 ENCOUNTER — Telehealth: Payer: Self-pay | Admitting: Orthopaedic Surgery

## 2017-02-01 DIAGNOSIS — N184 Chronic kidney disease, stage 4 (severe): Secondary | ICD-10-CM | POA: Diagnosis not present

## 2017-02-01 DIAGNOSIS — I10 Essential (primary) hypertension: Secondary | ICD-10-CM | POA: Diagnosis not present

## 2017-02-01 DIAGNOSIS — X58XXXA Exposure to other specified factors, initial encounter: Secondary | ICD-10-CM | POA: Diagnosis not present

## 2017-02-01 DIAGNOSIS — Z79899 Other long term (current) drug therapy: Secondary | ICD-10-CM | POA: Insufficient documentation

## 2017-02-01 DIAGNOSIS — Y939 Activity, unspecified: Secondary | ICD-10-CM | POA: Diagnosis not present

## 2017-02-01 DIAGNOSIS — Y929 Unspecified place or not applicable: Secondary | ICD-10-CM | POA: Insufficient documentation

## 2017-02-01 DIAGNOSIS — R569 Unspecified convulsions: Secondary | ICD-10-CM | POA: Insufficient documentation

## 2017-02-01 DIAGNOSIS — M25511 Pain in right shoulder: Secondary | ICD-10-CM | POA: Diagnosis not present

## 2017-02-01 DIAGNOSIS — Z87891 Personal history of nicotine dependence: Secondary | ICD-10-CM | POA: Diagnosis not present

## 2017-02-01 DIAGNOSIS — Y999 Unspecified external cause status: Secondary | ICD-10-CM | POA: Insufficient documentation

## 2017-02-01 DIAGNOSIS — S4991XA Unspecified injury of right shoulder and upper arm, initial encounter: Secondary | ICD-10-CM

## 2017-02-01 DIAGNOSIS — I129 Hypertensive chronic kidney disease with stage 1 through stage 4 chronic kidney disease, or unspecified chronic kidney disease: Secondary | ICD-10-CM | POA: Insufficient documentation

## 2017-02-01 DIAGNOSIS — F3160 Bipolar disorder, current episode mixed, unspecified: Secondary | ICD-10-CM

## 2017-02-01 MED ORDER — TRAMADOL HCL 50 MG PO TABS
50.0000 mg | ORAL_TABLET | Freq: Once | ORAL | Status: AC
  Administered 2017-02-01: 50 mg via ORAL
  Filled 2017-02-01: qty 1

## 2017-02-01 MED ORDER — VENLAFAXINE HCL ER 37.5 MG PO CP24
150.0000 mg | ORAL_CAPSULE | Freq: Two times a day (BID) | ORAL | Status: DC
  Administered 2017-02-01: 150 mg via ORAL
  Filled 2017-02-01: qty 4

## 2017-02-01 MED ORDER — DIAZEPAM 5 MG PO TABS
10.0000 mg | ORAL_TABLET | Freq: Once | ORAL | Status: AC
  Administered 2017-02-01: 10 mg via ORAL
  Filled 2017-02-01: qty 2

## 2017-02-01 MED ORDER — TOPIRAMATE 100 MG PO TABS
100.0000 mg | ORAL_TABLET | Freq: Once | ORAL | Status: AC
  Administered 2017-02-01: 100 mg via ORAL
  Filled 2017-02-01: qty 1

## 2017-02-01 MED ORDER — CARVEDILOL 12.5 MG PO TABS
12.5000 mg | ORAL_TABLET | Freq: Once | ORAL | Status: AC
  Administered 2017-02-01: 12.5 mg via ORAL
  Filled 2017-02-01: qty 1

## 2017-02-01 NOTE — Discharge Instructions (Signed)
As discussed, today's evaluation has been generally reassuring.  However, it is very important that you take your medication as directed, and be sure to follow-up with your primary care physician and our orthopedist.  Return here for concerning changes in your condition.

## 2017-02-01 NOTE — ED Triage Notes (Signed)
Pt had a seizure. No duration. Pt states having a seizure years ago. Currently not on seizure medications.  Hypertensive at  170/120. HX of HTN but pt ran out of BP of meds for 3 days.

## 2017-02-01 NOTE — ED Provider Notes (Signed)
[facility] EMERGENCY DEPARTMENT Provider Note   CSN: [id number] Arrival date & time:        History   Chief Complaint Chief Complaint  Patient presents with  . Seizures    HPI [name] is a 51 y.o. female.  HPI  Patient presents after witnessed seizure. Patient notes that she has had seizures in the past, though none in at least one year. She states that she was doing laundry, when she had a witnessed seizure. She denies focal pain that is new, acknowledges chronic muscle soreness, this is unchanged. She states that she feels mildly weak all over, mildly lightheaded, but no focal weakness, nor any near syncope. No chest pain, no confusion, no disorientation. Patient acknowledges taking multiple medication, states that she ran out of her medicine approximately 3 days ago. Initially she states that she only takes medication for blood pressure, but it is clear the patient also takes multiple medications including benzodiazepine.   Past Medical History:  Diagnosis Date  . Anemia   . Anxiety   . Bipolar disorder ([place])   . Depression   . History of blood transfusion   . Hypertension   . IUD    HISTORY OF IUD --REMOVED IN 2006  . Seizures ([place]) [id number]   two seizures due to a med changes    Patient Active Problem List   Diagnosis Date Noted  . Right sided abdominal pain 08/19/2016  . LGSIL on Pap smear of cervix 04/26/2016  . Vaginal discharge 04/26/2016  . Herpes simplex vulvovaginitis 03/01/2016  . Lower extremity edema 07/20/2015  . Chronic kidney disease, stage IV (severe) ([place]) 05/14/2015  . Proteinuria 05/14/2015  . H/O gastric bypass 01/21/2015  . Generalized headaches 01/05/2015  . Weight gain following gastric bypass surgery 08/13/2014  . Deficiency anemia 01/06/2014  . Iron deficiency anemia 01/06/2014  . Vitamin B12 deficiency (dietary) anemia 01/06/2014  . Essential hypertension, benign 01/25/2013  . Bipolar disorder [facility])     Past  Surgical History:  Procedure Laterality Date  . CHOLECYSTECTOMY    . GASTRIC BYPASS  2000  . INTRAUTERINE DEVICE (IUD) INSERTION  03/21/2010  . LAPAROSCOPIC GASTROTOMY W/ REPAIR OF ULCER    . PANNICULECTOMY    . ROTATOR CUFF REPAIR      OB History    Gravida Para Term Preterm AB Living   1       1 0   SAB TAB Ectopic Multiple Live Births   1               Home Medications    Prior to Admission medications   Medication Sig Start Date End Date Taking? Authorizing Provider  acetaminophen (ACETAMINOPHEN 8 HOUR) 650 MG CR tablet Take 650 mg daily as needed by mouth for pain.    [provider]  carvedilol (COREG) 12.5 MG tablet TAKE (1) TABLET BY MOUTH TWICE DAILY WITH A MEAL. 09/29/16   [name], FNP  diazepam (VALIUM) 10 MG tablet TAKE 1 TABLET BY MOUTH THREE TIMES DAILY 12/28/16   [name], FNP  furosemide (LASIX) 20 MG tablet Take 1 tablet (20 mg total) by mouth daily as needed for fluid. 08/18/16   [name], FNP  lidocaine (LIDODERM) 5 % APPLY 1 PATCH TO THE SKIN DAILY FOR 12 HOURS THEN REMOVE FOR 12 HOURS. 01/20/16   Sanjuana Kava, MD  Multiple Vitamins-Minerals (WOMENS DAILY FORMULA PO) Take 1 tablet daily by mouth.    [provider]  promethazine (PHENERGAN) 25 MG tablet Take 1 tablet (25 mg total) every 6 (six) hours as needed by mouth for nausea or vomiting. 01/23/17   McDonald, Mia A, PA-C  tiZANidine (ZANAFLEX) 4 MG tablet TAKE 1 TABLET BY MOUTH EVERY 12 HOURS AS NEEDED FOR SPASM 02/01/17   Sanjuana Kava, MD  topiramate (TOPAMAX) 100 MG tablet TAKE 2 AND 1/2 TABLETS(250 MG) BY MOUTH TWICE DAILY 12/28/16   [name], FNP  traMADol (ULTRAM) 50 MG tablet Take 1 tablet (50 mg total) by mouth every 6 (six) hours as needed. 10/27/16   Sanjuana Kava, MD  valACYclovir (VALTREX) 1000 MG tablet Take 0.5 tablets (500 mg total) by mouth daily. 11/29/16   Donnamae Jude, MD  venlafaxine XR (EFFEXOR-XR) 150 MG 24 hr capsule Take 1 capsule  (150 mg total) by mouth 2 (two) times daily. 09/29/16   [name], FNP    Family History Family History  Problem Relation Age of Onset  . Hypertension Mother   . Kidney failure Mother   . Hypertension Father   . Diabetes Cousin   . Diabetes Sister   . Hypertension Sister   . Hypertension Brother   . Hypertension Maternal Aunt   . Hypertension Maternal Uncle   . Hypertension Maternal Grandmother   . Hypertension Maternal Grandfather   . Hypertension Paternal Grandmother   . Hypertension Paternal Grandfather     Social History Social History   Tobacco Use  . Smoking status: Former Smoker    Packs/day: 0.25    Years: 4.00    Pack years: 1.00    Types: Cigarettes    Last attempt to quit: 03/21/1988    Years since quitting: 28.8  . Smokeless tobacco: Never Used  Substance Use Topics  . Alcohol use: Yes    Comment: 1-2 times a year.   . Drug use: No     Allergies   Ambien [zolpidem tartrate]; Lactulose; Ondansetron; Quetiapine; and Sulfa antibiotics   Review of Systems Review of Systems  Constitutional:       Per HPI, otherwise negative  HENT:       Per HPI, otherwise negative  Respiratory:       Per HPI, otherwise negative  Cardiovascular:       Per HPI, otherwise negative  Gastrointestinal: Negative for vomiting.  Endocrine:       Negative aside from HPI  Genitourinary:       Neg aside from HPI   Musculoskeletal:       Per HPI, otherwise negative  Skin: Negative.   Neurological: Positive for seizures. Negative for syncope.     Physical Exam Updated Vital Signs BP (!) 170/120 (BP Location: Right Arm)   Pulse (!) 102   Temp 97.7 F (36.5 C) (Oral)   Resp (!) 96   Ht 4\' 11"  (1.499 m)   Wt 77.1 kg (170 lb)   SpO2 96%   BMI 34.34 kg/m   Physical Exam  Constitutional: She is oriented to person, place, and time. She appears well-developed and well-nourished. No distress.  HENT:  Head: Normocephalic and atraumatic.  Eyes: Conjunctivae and  EOM are normal.  Cardiovascular: Normal rate and regular rhythm.  Pulmonary/Chest: Effort normal and breath sounds normal. No stridor. No respiratory distress.  Abdominal: She exhibits no distension.  Musculoskeletal: She exhibits no edema.  Neurological: She is alert and oriented to person, place, and time. She displays atrophy. She displays no tremor. No cranial nerve deficit or sensory deficit. She exhibits  normal muscle tone. She displays no seizure activity.  Skin: Skin is warm and dry.  Psychiatric: She has a normal mood and affect.  Nursing note and vitals reviewed.    ED Treatments / Results  Labs (all labs ordered are listed, but only abnormal results are displayed) Labs Reviewed - No data to display  EKG  EKG Interpretation  Date/Time:  Wednesday February 01 2017 14:45:01 EST Ventricular Rate:  98 PR Interval:    QRS Duration: 89 QT Interval:  350 QTC Calculation: 447 R Axis:   10 Text Interpretation:  Sinus rhythm LVH with secondary repolarization abnormality Anterior infarct, old Baseline wander in lead(s) I II aVR Abnormal ekg Confirmed by Carmin Muskrat [phone]) on 02/01/2017 3:21:14 PM       Radiology Dg Shoulder Right  Result Date: 02/01/2017 CLINICAL DATA:  Fall after seizure today. Two previous right shoulder surgeries. Pain anterior shoulder. EXAM: RIGHT SHOULDER - 2+ VIEW COMPARISON:  09/02/2015 FINDINGS: Degenerative change of the [facility] joint and glenohumeral joints. Postsurgical irregularity of the lateral humeral head unchanged and likely due to previous rotator cuff repair. There is a curvilinear 2.3 cm fragment adjacent the lateral aspect of the humeral head new since the previous exam, although this may be acute or chronic. No evidence of dislocation. Remainder of the exam is unchanged. IMPRESSION: 2.3 cm curvilinear fragment adjacent the lateral aspect of the humeral head which may be chronic versus acute chip fracture. Postsurgical changes of the lateral  humeral head as well as degenerative changes of the [facility] joint and glenohumeral joints. Electronically Signed   By: Marin Olp M.D.   On: 02/01/2017 16:40    Procedures Procedures (including critical care time)  Medications Ordered in ED Medications  venlafaxine XR (EFFEXOR-XR) 24 hr capsule 150 mg (150 mg Oral Given 02/01/17 1724)  traMADol (ULTRAM) tablet 50 mg (not administered)  carvedilol (COREG) tablet 12.5 mg (12.5 mg Oral Given 02/01/17 1724)  diazepam (VALIUM) tablet 10 mg (10 mg Oral Given 02/01/17 1724)  topiramate (TOPAMAX) tablet 100 mg (100 mg Oral Given 02/01/17 1723)   After the initial evaluation patient was provided home medication including benzodiazepine, Topamax, antihypertensives.   Initial Impression / Assessment and Plan / ED Course  I have reviewed the triage vital signs and the nursing notes.  Pertinent labs & imaging results that were available during my care of the patient were reviewed by me and considered in my medical decision making (see chart for details).  3:41 PM On repeat exam patient is in no distress. She now describes pain in her right shoulder. She also acknowledges using benzodiazepine and Topamax, both of which she has not been taking for several days.  Right shoulder is not grossly deformed, but with concern for possible posttraumatic effects, x-ray will be ordered.   6:03 PM Patient much more awake. She remains mildly hypertensive, but has just received her home medication. No new complaints. Discussed about her shoulder x-ray abnormality, and she is unsure if this is new or old, but with concern for possible fracture the patient was placed in a sling immobilizer. This was well-tolerated. Patient will follow up with her orthopedist in this regard. Patient also acknowledges importance of obtaining all home medication and resuming appropriate use starting in the morning. With no new complaints, no ongoing neurologic abnormalities, there  is some suspicion for her labs in medication is contributing to her seizure event, particularly given that the patient takes Topamax and benzodiazepine. With otherwise reassuring findings, patient will  follow up with primary care and orthopedics. Final Clinical Impressions(s) / ED Diagnoses   Final diagnoses:  Seizure [facility])  Injury of right shoulder, initial encounter     Carmin Muskrat, MD 02/01/17 [phone]

## 2017-02-02 ENCOUNTER — Telehealth: Payer: Self-pay | Admitting: *Deleted

## 2017-02-02 NOTE — Telephone Encounter (Signed)
Recommend visit to address given several recent ER visits

## 2017-02-02 NOTE — Telephone Encounter (Signed)
Patient stopped in the office requesting the refill. She states she really needs this medication. She states she has been out of the medication for 2 or 3 weeks. ((While I see it was filled on 10/10 by [facility]&I conformed that with her)) She states she had to go to the ER yesterday due to being out and having withdrawals. I informed her she has to give [place] at less a 24 to 72 hour turn around time to repose to her request. I also informed she has to understand you have not seen her before and this being controlled medication, she would most likely need to be seen.

## 2017-02-02 NOTE — Telephone Encounter (Signed)
As the patient reports she has been off the medication for several weeks, I am not able to restart this medication prior to seeing the patient. I will be glad to discuss this with her at our upcoming visit. If she feels she needs this medication sooner she should schedule an acute visit with any provider at her convenience.

## 2017-02-02 NOTE — Telephone Encounter (Signed)
Rec'd call pt states she really need to get a refill on her Diazepam. She is out and had to go to ER yesterday. She has made an appt for 02/14/17. Requesting refill on med until her appt...[name]

## 2017-02-03 NOTE — Telephone Encounter (Signed)
Patient has been informed. She started how are we going to let her go with out this medication through the holidays. She is going to have another seizure. I have informed I can set her up an appointment with a another pcp here for a hospital fu but that does not mean she will get a refill. She states she is just going to wait and see you so she can clear up everything and make sure you and her are on the same page about the medication.

## 2017-02-10 DIAGNOSIS — F319 Bipolar disorder, unspecified: Secondary | ICD-10-CM | POA: Diagnosis not present

## 2017-02-10 DIAGNOSIS — F4489 Other dissociative and conversion disorders: Secondary | ICD-10-CM | POA: Diagnosis not present

## 2017-02-10 DIAGNOSIS — R4020 Unspecified coma: Secondary | ICD-10-CM | POA: Diagnosis not present

## 2017-02-10 DIAGNOSIS — I129 Hypertensive chronic kidney disease with stage 1 through stage 4 chronic kidney disease, or unspecified chronic kidney disease: Secondary | ICD-10-CM | POA: Diagnosis not present

## 2017-02-10 DIAGNOSIS — M25551 Pain in right hip: Secondary | ICD-10-CM | POA: Diagnosis not present

## 2017-02-10 DIAGNOSIS — Z9884 Bariatric surgery status: Secondary | ICD-10-CM | POA: Diagnosis not present

## 2017-02-10 DIAGNOSIS — S79911A Unspecified injury of right hip, initial encounter: Secondary | ICD-10-CM | POA: Diagnosis not present

## 2017-02-10 DIAGNOSIS — R918 Other nonspecific abnormal finding of lung field: Secondary | ICD-10-CM | POA: Diagnosis not present

## 2017-02-10 DIAGNOSIS — D509 Iron deficiency anemia, unspecified: Secondary | ICD-10-CM | POA: Diagnosis not present

## 2017-02-10 DIAGNOSIS — G40409 Other generalized epilepsy and epileptic syndromes, not intractable, without status epilepticus: Secondary | ICD-10-CM | POA: Diagnosis not present

## 2017-02-10 DIAGNOSIS — D649 Anemia, unspecified: Secondary | ICD-10-CM | POA: Diagnosis not present

## 2017-02-10 DIAGNOSIS — N189 Chronic kidney disease, unspecified: Secondary | ICD-10-CM | POA: Diagnosis not present

## 2017-02-10 DIAGNOSIS — R569 Unspecified convulsions: Secondary | ICD-10-CM | POA: Diagnosis not present

## 2017-02-11 DIAGNOSIS — I129 Hypertensive chronic kidney disease with stage 1 through stage 4 chronic kidney disease, or unspecified chronic kidney disease: Secondary | ICD-10-CM | POA: Diagnosis present

## 2017-02-11 DIAGNOSIS — R42 Dizziness and giddiness: Secondary | ICD-10-CM | POA: Diagnosis not present

## 2017-02-11 DIAGNOSIS — D509 Iron deficiency anemia, unspecified: Secondary | ICD-10-CM | POA: Diagnosis not present

## 2017-02-11 DIAGNOSIS — Z6834 Body mass index (BMI) 34.0-34.9, adult: Secondary | ICD-10-CM | POA: Diagnosis not present

## 2017-02-11 DIAGNOSIS — N189 Chronic kidney disease, unspecified: Secondary | ICD-10-CM | POA: Diagnosis not present

## 2017-02-11 DIAGNOSIS — Z79899 Other long term (current) drug therapy: Secondary | ICD-10-CM | POA: Diagnosis not present

## 2017-02-11 DIAGNOSIS — F319 Bipolar disorder, unspecified: Secondary | ICD-10-CM | POA: Diagnosis not present

## 2017-02-11 DIAGNOSIS — Z9884 Bariatric surgery status: Secondary | ICD-10-CM | POA: Diagnosis not present

## 2017-02-11 DIAGNOSIS — I10 Essential (primary) hypertension: Secondary | ICD-10-CM | POA: Diagnosis not present

## 2017-02-11 DIAGNOSIS — E669 Obesity, unspecified: Secondary | ICD-10-CM | POA: Diagnosis not present

## 2017-02-11 DIAGNOSIS — G40409 Other generalized epilepsy and epileptic syndromes, not intractable, without status epilepticus: Secondary | ICD-10-CM | POA: Diagnosis not present

## 2017-02-11 DIAGNOSIS — Z23 Encounter for immunization: Secondary | ICD-10-CM | POA: Diagnosis not present

## 2017-02-15 ENCOUNTER — Other Ambulatory Visit (INDEPENDENT_AMBULATORY_CARE_PROVIDER_SITE_OTHER): Payer: Medicare Other

## 2017-02-15 ENCOUNTER — Ambulatory Visit (INDEPENDENT_AMBULATORY_CARE_PROVIDER_SITE_OTHER): Payer: Medicare Other | Admitting: Nurse Practitioner

## 2017-02-15 ENCOUNTER — Encounter: Payer: Self-pay | Admitting: Nurse Practitioner

## 2017-02-15 VITALS — BP 142/92 | HR 91 | Temp 98.6°F | Resp 16 | Ht 59.0 in | Wt 167.8 lb

## 2017-02-15 DIAGNOSIS — I10 Essential (primary) hypertension: Secondary | ICD-10-CM | POA: Diagnosis not present

## 2017-02-15 DIAGNOSIS — R569 Unspecified convulsions: Secondary | ICD-10-CM

## 2017-02-15 DIAGNOSIS — N184 Chronic kidney disease, stage 4 (severe): Secondary | ICD-10-CM

## 2017-02-15 DIAGNOSIS — D539 Nutritional anemia, unspecified: Secondary | ICD-10-CM | POA: Diagnosis not present

## 2017-02-15 DIAGNOSIS — Z5181 Encounter for therapeutic drug level monitoring: Secondary | ICD-10-CM | POA: Diagnosis not present

## 2017-02-15 DIAGNOSIS — F3160 Bipolar disorder, current episode mixed, unspecified: Secondary | ICD-10-CM

## 2017-02-15 DIAGNOSIS — D508 Other iron deficiency anemias: Secondary | ICD-10-CM

## 2017-02-15 LAB — FERRITIN: Ferritin: 21.9 ng/mL (ref 10.0–291.0)

## 2017-02-15 LAB — CBC
HCT: 36.7 % (ref 36.0–46.0)
Hemoglobin: 11 g/dL — ABNORMAL LOW (ref 12.0–15.0)
MCHC: 29.9 g/dL — ABNORMAL LOW (ref 30.0–36.0)
MCV: 77.3 fl — ABNORMAL LOW (ref 78.0–100.0)
Platelets: 256 10*3/uL (ref 150.0–400.0)
RBC: 4.75 Mil/uL (ref 3.87–5.11)
RDW: 20.7 % — ABNORMAL HIGH (ref 11.5–15.5)
WBC: 5 10*3/uL (ref 4.0–10.5)

## 2017-02-15 LAB — BASIC METABOLIC PANEL
BUN: 24 mg/dL — ABNORMAL HIGH (ref 6–23)
CO2: 23 mEq/L (ref 19–32)
Calcium: 8.8 mg/dL (ref 8.4–10.5)
Chloride: 112 mEq/L (ref 96–112)
Creatinine, Ser: 2.2 mg/dL — ABNORMAL HIGH (ref 0.40–1.20)
GFR: 30.27 mL/min — ABNORMAL LOW (ref >60.00)
Glucose, Bld: 82 mg/dL (ref 70–99)
Potassium: 4 mEq/L (ref 3.5–5.1)
Sodium: 143 mEq/L (ref 135–145)

## 2017-02-15 LAB — IBC PANEL
Iron: 50 ug/dL (ref 42–145)
Saturation Ratios: 12.6 % — ABNORMAL LOW (ref 20.0–50.0)
Transferrin: 284 mg/dL (ref 212.0–360.0)

## 2017-02-15 MED ORDER — CARVEDILOL 12.5 MG PO TABS: ORAL_TABLET | ORAL | 0 refills | Status: DC

## 2017-02-15 MED ORDER — AMLODIPINE BESYLATE 5 MG PO TABS
5.0000 mg | ORAL_TABLET | Freq: Every day | ORAL | 3 refills | Status: DC
Start: 2017-02-15 — End: 2017-05-25

## 2017-02-15 NOTE — Assessment & Plan Note (Signed)
Re-addressed recent CMET with decreased Kidney function results to patient. She does not want to return to nephrology and does not want to go on dialysis. She declines a referral today.

## 2017-02-15 NOTE — Progress Notes (Signed)
Subjective:    Patient ID: [name], female    DOB: 1965-04-06, 51 y.o.   MRN: [id number]  HPI [name] is a 51 yo female who presents today to establish care. Shes transferring to me from another provider in the same clinic. She is coming in today for an ER follow up visit.  She was seen in the ER on 11/14 for a witnessed seizure. She attributed the seizure to having not taken her valium for 3 days due to running out of the medication. She did injure her right shoulder during the seizure and had a shoulder x-ray with questionable chip fracture and was referred to  orthopedics. She was given 10mg  valium in the ER and discharged home with instructions to f/u with PCP.  Since home she has continued to feel some generalized soreness to her body but this is improving. She has not scheduled a follow up with orthopedics yet. She states she lost her phone. She denies any additional seizures since home. She reports this seizure was her second seizure since 2009. She has not seen a neurologist for seizures in the past. She has now been off the valium for approximately 1 month.  Anemia-She reports history of chronic anemia. She says she was given 2 liters of blood earlier this month but I can not find record of that in her chart. She was seen in the ER for syncope on 11/5 and sent home with instructions to follow up with her GI and cardiology. She did not follow up. She tells me that her GI doctor is in [place], but ER records state that her GI doctor is here in [place]. She denies any lightheadedness, dizziness, shortness of breath, rectal bleeding. she says she feels better since she received 2 pints of blood.   Bipolar disorder- maintained on valium, effexor, topamax. She was originally prescribed these medications by psychiatry but lost insurance coverage and has been getting these refills from her prior PCP. She ran out of her valium about 1 month ago and states she's felt more irritable and  "cranky" since. She has not had thoughts of harming herself or others. She is adamantly requesting a valium refill.  Hypertension- Maintained on carvedilol. Reports she takes daily. She does not check BP at home. Denies headaches, vision changes, cp, sob.  BP Readings from Last 3 Encounters:  02/15/17 (!) 142/92  02/01/17 (!) 172/109  01/23/17 (!) 199/104   CKD stage 4- she says that she has been to 2 nephrologists for her end stage CKD and did not return for follow up appointments. She felt the providers did not giv eher all of her options and she refuses to go on dialysisi.  Review of Systems  See HPI  Past Medical History:  Diagnosis Date  . Anemia   . Anxiety   . Bipolar disorder ([place])   . Depression   . History of blood transfusion   . Hypertension   . IUD    HISTORY OF IUD --REMOVED IN 2006  . Seizures ([place]) [id number]   two seizures due to a med changes     Social History   Socioeconomic History  . Marital status: Single    Spouse name: Not on file  . Number of children: 0  . Years of education: 34  . Highest education level: Not on file  Social Needs  . Financial resource strain: Not on file  . Food insecurity - worry: Not on file  . Food insecurity -  inability: Not on file  . Transportation needs - medical: Not on file  . Transportation needs - non-medical: Not on file  Occupational History  . Occupation: Retired  Tobacco Use  . Smoking status: Former Smoker    Packs/day: 0.25    Years: 4.00    Pack years: 1.00    Types: Cigarettes    Last attempt to quit: 03/21/1988    Years since quitting: 28.9  . Smokeless tobacco: Never Used  Substance and Sexual Activity  . Alcohol use: Yes    Comment: 1-2 times a year.   . Drug use: No  . Sexual activity: No  Other Topics Concern  . Not on file  Social History Narrative   Fun: Puzzles, watch mystery shows, read   Denies religious beliefs effecting health care.     Past Surgical History:  Procedure  Laterality Date  . CARPAL TUNNEL RELEASE Right 07/08/2014   Procedure: RIGHT CARPAL TUNNEL RELEASE;  Surgeon: Sanjuana Kava, MD;  Location: [facility];  Service: Orthopedics;  Laterality: Right;  . CHOLECYSTECTOMY    . GASTRIC BYPASS  2000  . HYSTEROSCOPY W/D&C  11/14/2011   Procedure: DILATATION AND CURETTAGE /HYSTEROSCOPY;  Surgeon: Marylynn Pearson, MD;  Location: [facility];  Service: Gynecology;;  with removal of polyps  . INTRAUTERINE DEVICE (IUD) INSERTION  03/21/2010  . LAPAROSCOPIC GASTROTOMY W/ REPAIR OF ULCER    . PANNICULECTOMY    . ROTATOR CUFF REPAIR      Family History  Problem Relation Age of Onset  . Hypertension Mother   . Kidney failure Mother   . Hypertension Father   . Diabetes Cousin   . Diabetes Sister   . Hypertension Sister   . Hypertension Brother   . Hypertension Maternal Aunt   . Hypertension Maternal Uncle   . Hypertension Maternal Grandmother   . Hypertension Maternal Grandfather   . Hypertension Paternal Grandmother   . Hypertension Paternal Grandfather     Allergies  Allergen Reactions  . Ambien [Zolpidem Tartrate]     "BLACK OUT", SLEEP DRIVING  . Geodon [Ziprasidone Hcl]     Seizure activity  . Lactulose Itching  . Ondansetron Nausea And Vomiting  . Quetiapine     Other reaction(s): Other (See Comments) Possible Hair Loss  . Sulfa Antibiotics Rash    Current Outpatient Medications on File Prior to Visit  Medication Sig Dispense Refill  . carvedilol (COREG) 12.5 MG tablet TAKE (1) TABLET BY MOUTH TWICE DAILY WITH A MEAL. 180 tablet 0  . diazepam (VALIUM) 10 MG tablet TAKE 1 TABLET BY MOUTH THREE TIMES DAILY 90 tablet 0  . furosemide (LASIX) 20 MG tablet Take 1 tablet (20 mg total) by mouth daily as needed for fluid. 30 tablet 1  . promethazine (PHENERGAN) 25 MG tablet Take 1 tablet (25 mg total) every 6 (six) hours as needed by mouth for nausea or vomiting. 30 tablet 0  . tiZANidine (ZANAFLEX) 4 MG tablet TAKE 1 TABLET BY MOUTH EVERY 12 HOURS AS  NEEDED FOR SPASM 40 tablet 0  . topiramate (TOPAMAX) 100 MG tablet TAKE 2 AND 1/2 TABLETS(250 MG) BY MOUTH TWICE DAILY 150 tablet 0  . traMADol (ULTRAM) 50 MG tablet Take 1 tablet (50 mg total) by mouth every 6 (six) hours as needed. 90 tablet 3  . valACYclovir (VALTREX) 1000 MG tablet Take 0.5 tablets (500 mg total) by mouth daily. 30 tablet 5  . venlafaxine XR (EFFEXOR-XR) 150 MG 24 hr capsule Take 1 capsule (150 mg  total) by mouth 2 (two) times daily. 180 capsule 0   No current facility-administered medications on file prior to visit.     BP (!) 142/92 (BP Location: Left Arm, Patient Position: Sitting, Cuff Size: Large)   Pulse 91   Temp 98.6 F (37 C) (Oral)   Resp 16   Ht 4\' 11"  (1.499 m)   Wt 167 lb 12.8 oz (76.1 kg)   SpO2 97%   BMI 33.89 kg/m    Objective:   Physical Exam  Constitutional: She is oriented to person, place, and time. She appears well-developed and well-nourished.  HENT:  Head: Normocephalic and atraumatic.  Cardiovascular: Normal rate, regular rhythm, normal heart sounds and intact distal pulses.  Pulmonary/Chest: Effort normal and breath sounds normal.  Neurological: She is alert and oriented to person, place, and time. Coordination normal.  Skin: Skin is warm and dry. There is pallor.  Psychiatric: Her speech is normal. Her affect is blunt and labile. She is aggressive. Cognition and memory are normal. She expresses inappropriate judgment. She expresses no homicidal and no suicidal ideation.       Assessment & Plan:  1 month follow up

## 2017-02-15 NOTE — Patient Instructions (Addendum)
Please head downstairs for lab work.  I have placed a referral to neuropsyhciatry. Our office will call you to schedule this appointment. You should hear from our office in 7-10 days.  Please start taking amlodipine 5mg  daily for your blood pressure. Continue taking your carvedilol as prescribed. Our goal is to get your blood pressure below 140/90.  I would like to see you back in about 1 month to see how you are doing.  It was nice to meet you. Thanks for letting me take care of you today :)

## 2017-02-15 NOTE — Assessment & Plan Note (Signed)
REcent CBC shows decreasing H&H. History per patient today inconsistent with recent hospital notes regarding chronic anemia management. Repeat CBC today. IBC panel, ferritin today. Will clarify with patient pending CBC results.

## 2017-02-15 NOTE — Assessment & Plan Note (Addendum)
Maintained on topamax, effexor. Continues to have daily irritability. She was prior on valium but ran out 1 month ago. We discussed long term risks of valium use and will not restart valium. She is very focused on valium today and does not seem concerned about other chronic, uncontrolled conditions. She was mildly aggressive with me when I declined to refill her valium. neuropsych referral placed today for further evaluation and management. Suspect that her poorly managed conditions including bipolar disorder and end stage renal disease are impairing her ability to make thoughtful decisions about her medical care. I hope that she will follow up with neuropsych for further evaluation of  her seizures and poorly controlled bipolar disorder. I emphasized the importance of this referral to the patient.

## 2017-02-15 NOTE — Assessment & Plan Note (Signed)
1. Essential hypertension, benign START- amLODipine (NORVASC) 5 MG tablet; Take 1 tablet (5 mg total) by mouth daily.  Dispense: 30 tablet; Refill: 3 CONTINUE- carvedilol (COREG) 12.5 MG tablet; TAKE (1) TABLET BY MOUTH TWICE DAILY WITH A MEAL.  Dispense: 180 tablet; Refill:   BP goal <140/90 RTC In 1 month for follow up of new BP medication

## 2017-02-18 ENCOUNTER — Other Ambulatory Visit: Payer: Self-pay | Admitting: Nurse Practitioner

## 2017-02-18 ENCOUNTER — Other Ambulatory Visit: Payer: Self-pay | Admitting: Orthopaedic Surgery

## 2017-02-18 DIAGNOSIS — N184 Chronic kidney disease, stage 4 (severe): Secondary | ICD-10-CM

## 2017-02-18 DIAGNOSIS — R55 Syncope and collapse: Secondary | ICD-10-CM

## 2017-02-18 NOTE — Progress Notes (Signed)
See result note.  

## 2017-02-20 ENCOUNTER — Telehealth: Payer: Self-pay | Admitting: Nurse Practitioner

## 2017-02-20 NOTE — Telephone Encounter (Signed)
Copied from [place]. Topic: Quick Communication - Rx Refill/Question >> Feb 20, 2017 12:51 PM Lennox Solders wrote: Has the patient contacted their pharmacy? {no (Agent: If no, request that the patient contact the pharmacy for the refill.) Preferred Pharmacy (with phone number or street name): [email or web address] [phone] Agent: pt would like refill on valium

## 2017-02-22 NOTE — Telephone Encounter (Signed)
Per Caryl Pina will not refill... At last OV she discussed w/patient (see below).../lmb  " Maintained on topamax, effexor. Continues to have daily irritability. She was prior on valium but ran out 1 month ago. We discussed long term risks of valium use and will not restart valium. She is very focused on valium today and does not seem concerned about other chronic, uncontrolled conditions. She was mildly aggressive with me when I declined to refill her valium."

## 2017-02-22 NOTE — Telephone Encounter (Signed)
Check [place] registry last filled 12/30/2016...[name]

## 2017-02-22 NOTE — Telephone Encounter (Signed)
Pt requesting refill on Valium. Medication not seen on med list. Last OV 02/15/17.

## 2017-02-23 ENCOUNTER — Telehealth: Payer: Self-pay | Admitting: *Deleted

## 2017-02-23 NOTE — Telephone Encounter (Signed)
Discharge Summary Report received from [facility]. Report will be mailed interoffice to [place].

## 2017-02-24 DIAGNOSIS — F4489 Other dissociative and conversion disorders: Secondary | ICD-10-CM | POA: Diagnosis not present

## 2017-02-24 DIAGNOSIS — R569 Unspecified convulsions: Secondary | ICD-10-CM | POA: Diagnosis not present

## 2017-02-24 DIAGNOSIS — Z79899 Other long term (current) drug therapy: Secondary | ICD-10-CM | POA: Diagnosis not present

## 2017-02-24 DIAGNOSIS — G40909 Epilepsy, unspecified, not intractable, without status epilepticus: Secondary | ICD-10-CM | POA: Diagnosis not present

## 2017-02-24 DIAGNOSIS — R402411 Glasgow coma scale score 13-15, in the field [EMT or ambulance]: Secondary | ICD-10-CM | POA: Diagnosis not present

## 2017-02-24 DIAGNOSIS — I6789 Other cerebrovascular disease: Secondary | ICD-10-CM | POA: Diagnosis not present

## 2017-02-24 DIAGNOSIS — I10 Essential (primary) hypertension: Secondary | ICD-10-CM | POA: Diagnosis not present

## 2017-03-01 ENCOUNTER — Other Ambulatory Visit: Payer: Self-pay | Admitting: Family

## 2017-03-01 ENCOUNTER — Other Ambulatory Visit: Payer: Self-pay | Admitting: Orthopaedic Surgery

## 2017-03-02 ENCOUNTER — Ambulatory Visit: Payer: Self-pay | Admitting: Orthopaedic Surgery

## 2017-03-02 ENCOUNTER — Other Ambulatory Visit: Payer: Self-pay

## 2017-03-02 MED ORDER — TOPIRAMATE 100 MG PO TABS: ORAL_TABLET | ORAL | 0 refills | Status: DC

## 2017-03-08 ENCOUNTER — Encounter: Payer: Self-pay | Admitting: Orthopaedic Surgery

## 2017-03-08 ENCOUNTER — Ambulatory Visit (INDEPENDENT_AMBULATORY_CARE_PROVIDER_SITE_OTHER): Payer: Medicare Other | Admitting: Orthopaedic Surgery

## 2017-03-08 VITALS — BP 115/80 | HR 74 | Temp 95.7°F | Ht 59.0 in | Wt 163.0 lb

## 2017-03-08 DIAGNOSIS — M25511 Pain in right shoulder: Secondary | ICD-10-CM | POA: Diagnosis not present

## 2017-03-08 DIAGNOSIS — M25512 Pain in left shoulder: Secondary | ICD-10-CM | POA: Diagnosis not present

## 2017-03-08 DIAGNOSIS — G8929 Other chronic pain: Secondary | ICD-10-CM

## 2017-03-08 MED ORDER — TIZANIDINE HCL 4 MG PO TABS: ORAL_TABLET | ORAL | 2 refills | Status: DC

## 2017-03-08 MED ORDER — TRAMADOL HCL 50 MG PO TABS: 50.0000 mg | ORAL_TABLET | Freq: Four times a day (QID) | ORAL | 0 refills | Status: DC | PRN

## 2017-03-08 NOTE — Progress Notes (Signed)
Patient KZ:SWFUXNA D Mak, female DOB:1966/01/13, 51 y.o. [name]  Chief Complaint  Patient presents with  . Follow-up    chronic right shoulder pain    HPI  [name] is a 51 y.o. female who has chronic pain of both shoulders, more of the right shoulder.  She has had multiple surgeries.  She has more pain with the colder weather.  She is taking her medicine. She has no numbness or new trauma. HPI  Body mass index is 32.92 kg/m.  ROS  Review of Systems  HENT: Negative for congestion.   Respiratory: Negative for cough and shortness of breath.   Cardiovascular: Negative for chest pain and leg swelling.  Endocrine: Positive for cold intolerance.  Musculoskeletal: Positive for arthralgias.  Allergic/Immunologic: Positive for environmental allergies.  All other systems reviewed and are negative.   Past Medical History:  Diagnosis Date  . Anemia   . Anxiety   . Bipolar disorder ([place])   . Depression   . History of blood transfusion   . Hypertension   . IUD    HISTORY OF IUD --REMOVED IN 2006  . Seizures ([place]) [id number]   two seizures due to a med changes    Past Surgical History:  Procedure Laterality Date  . CARPAL TUNNEL RELEASE Right 07/08/2014   Procedure: RIGHT CARPAL TUNNEL RELEASE;  Surgeon: Sanjuana Kava, MD;  Location: [facility];  Service: Orthopedics;  Laterality: Right;  . CHOLECYSTECTOMY    . GASTRIC BYPASS  2000  . HYSTEROSCOPY W/D&C  11/14/2011   Procedure: DILATATION AND CURETTAGE /HYSTEROSCOPY;  Surgeon: Marylynn Pearson, MD;  Location: [facility];  Service: Gynecology;;  with removal of polyps  . INTRAUTERINE DEVICE (IUD) INSERTION  03/21/2010  . LAPAROSCOPIC GASTROTOMY W/ REPAIR OF ULCER    . PANNICULECTOMY    . ROTATOR CUFF REPAIR      Family History  Problem Relation Age of Onset  . Hypertension Mother   . Kidney failure Mother   . Hypertension Father   . Diabetes Cousin   . Diabetes Sister   . Hypertension Sister   . Hypertension  Brother   . Hypertension Maternal Aunt   . Hypertension Maternal Uncle   . Hypertension Maternal Grandmother   . Hypertension Maternal Grandfather   . Hypertension Paternal Grandmother   . Hypertension Paternal Grandfather     Social History Social History   Tobacco Use  . Smoking status: Former Smoker    Packs/day: 0.25    Years: 4.00    Pack years: 1.00    Types: Cigarettes    Last attempt to quit: 03/21/1988    Years since quitting: 28.9  . Smokeless tobacco: Never Used  Substance Use Topics  . Alcohol use: Yes    Comment: 1-2 times a year.   . Drug use: No    Allergies  Allergen Reactions  . Ambien [Zolpidem Tartrate]     "BLACK OUT", SLEEP DRIVING  . Geodon [Ziprasidone Hcl]     Seizure activity  . Lactulose Itching  . Ondansetron Nausea And Vomiting  . Quetiapine     Other reaction(s): Other (See Comments) Possible Hair Loss  . Sulfa Antibiotics Rash    Current Outpatient Medications  Medication Sig Dispense Refill  . amLODipine (NORVASC) 5 MG tablet Take 1 tablet (5 mg total) by mouth daily. 30 tablet 3  . carvedilol (COREG) 12.5 MG tablet TAKE (1) TABLET BY MOUTH TWICE DAILY WITH A MEAL. 180 tablet 0  . promethazine (PHENERGAN) 25 MG tablet  Take 1 tablet (25 mg total) every 6 (six) hours as needed by mouth for nausea or vomiting. 30 tablet 0  . tiZANidine (ZANAFLEX) 4 MG tablet One tablet by mouth every 12 hours as needed for spasm. 40 tablet 2  . topiramate (TOPAMAX) 100 MG tablet TAKE 2 AND 1/2 TABLETS(250 MG) BY MOUTH TWICE DAILY 150 tablet 0  . traMADol (ULTRAM) 50 MG tablet Take 1 tablet (50 mg total) by mouth every 6 (six) hours as needed. 90 tablet 0  . valACYclovir (VALTREX) 1000 MG tablet Take 0.5 tablets (500 mg total) by mouth daily. 30 tablet 5  . venlafaxine XR (EFFEXOR-XR) 150 MG 24 hr capsule Take 1 capsule (150 mg total) by mouth 2 (two) times daily. 180 capsule 0   No current facility-administered medications for this visit.      Physical  Exam  Blood pressure 115/80, pulse 74, temperature (!) 95.7 F (35.4 C), height 4\' 11"  (1.499 m), weight 163 lb (73.9 kg).  Constitutional: overall normal hygiene, normal nutrition, well developed, normal grooming, normal body habitus. Assistive device:none  Musculoskeletal: gait and station Limp none, muscle tone and strength are normal, no tremors or atrophy is present.  .  Neurological: coordination overall normal.  Deep tendon reflex/nerve stretch intact.  Sensation normal.  Cranial nerves II-XII intact.   Skin:   Normal overall no scars, lesions, ulcers or rashes. No psoriasis.  Psychiatric: Alert and oriented x 3.  Recent memory intact, remote memory unclear.  Normal mood and affect. Well groomed.  Good eye contact.  Cardiovascular: overall no swelling, no varicosities, no edema bilaterally, normal temperatures of the legs and arms, no clubbing, cyanosis and good capillary refill.  Lymphatic: palpation is normal.  All other systems reviewed and are negative   Examination of right Upper Extremity is done.  Inspection:   Overall:  Elbow non-tender without crepitus or defects, forearm non-tender without crepitus or defects, wrist non-tender without crepitus or defects, hand non-tender.    Shoulder: with glenohumeral joint tenderness, without effusion.   Upper arm: without swelling and tenderness   Range of motion:   Overall:  Full range of motion of the elbow, full range of motion of wrist and full range of motion in fingers.   Shoulder:  right  100 degrees forward flexion; 75 degrees abduction; 20 degrees internal rotation, 20 degrees external rotation, 10 degrees extension, 35 degrees adduction.   Stability:   Overall:  Shoulder, elbow and wrist stable   Strength and Tone:   Overall full shoulder muscles strength, full upper arm strength and normal upper arm bulk and tone.  The patient has been educated about the nature of the problem(s) and counseled on treatment options.   The patient appeared to understand what I have discussed and is in agreement with it.  Encounter Diagnoses  Name Primary?  . Chronic right shoulder pain Yes  . Chronic left shoulder pain     PLAN Call if any problems.  Precautions discussed.  Continue current medications.   Return to clinic 3 months   Electronically Signed Sanjuana Kava, MD 12/19/20189:32 AM

## 2017-03-23 ENCOUNTER — Encounter: Payer: Self-pay | Admitting: Nurse Practitioner

## 2017-03-29 ENCOUNTER — Ambulatory Visit (INDEPENDENT_AMBULATORY_CARE_PROVIDER_SITE_OTHER): Payer: Medicare Other | Admitting: Internal Medicine

## 2017-03-29 ENCOUNTER — Encounter: Payer: Self-pay | Admitting: Internal Medicine

## 2017-03-29 VITALS — BP 102/82 | HR 78 | Ht 59.0 in | Wt 163.0 lb

## 2017-03-29 DIAGNOSIS — R197 Diarrhea, unspecified: Secondary | ICD-10-CM | POA: Diagnosis not present

## 2017-03-29 DIAGNOSIS — I10 Essential (primary) hypertension: Secondary | ICD-10-CM | POA: Diagnosis not present

## 2017-03-29 DIAGNOSIS — R55 Syncope and collapse: Secondary | ICD-10-CM

## 2017-03-29 NOTE — Patient Instructions (Signed)
Your physician has recommended you make the following change in your medication:  -- DECREASE amlodipine from 5mg  to 2.5mg  daily  Your physician recommends that you schedule a follow-up appointment with Dr. Debara Pickett AS NEEDED

## 2017-03-29 NOTE — Progress Notes (Signed)
OFFICE CONSULT NOTE  Chief Complaint:  Syncope  Primary Care Physician: Lance Sell, NP  HPI:  [name] is a 52 y.o. female who is being seen today for the evaluation of syncope at the request of Lance Sell, NP. This is a 52 yo female with a history of anxiety, bipolar disorder, anemia, HTN and seizures. She was seen in the ER on 01/23/2017 for syncope. She had reported generalized fatigue and weakness over the past month leading up to this.  She apparently had an unwitnessed syncopal event.  She said she felt lightheaded when raising up from a seated position.  She also had reported several episodes of fecal incontinence and has a history of IBS as well followed by Dr. Benson Norway.  This is also associated with a chronic abdominal pain.  ER workup was negative without evidence of significant anemia, dehydration, EKG abnormalities or abnormal troponin.  Cardiology follow-up was recommended.  Went to that she was seen in the ER about a week later for seizure.  She said she had stopped taking her Valium.  She also takes Topamax.  Blood pressure is noted to be low today at 102/82.  PMHx:  Past Medical History:  Diagnosis Date  . Anemia   . Anxiety   . Bipolar disorder ([place])   . Depression   . History of blood transfusion   . Hypertension   . IUD    HISTORY OF IUD --REMOVED IN 2006  . Seizures ([place]) [id number]   two seizures due to a med changes    Past Surgical History:  Procedure Laterality Date  . CARPAL TUNNEL RELEASE Right 07/08/2014   Procedure: RIGHT CARPAL TUNNEL RELEASE;  Surgeon: Sanjuana Kava, MD;  Location: [facility];  Service: Orthopedics;  Laterality: Right;  . CHOLECYSTECTOMY    . GASTRIC BYPASS  2000  . HYSTEROSCOPY W/D&C  11/14/2011   Procedure: DILATATION AND CURETTAGE /HYSTEROSCOPY;  Surgeon: Marylynn Pearson, MD;  Location: [facility];  Service: Gynecology;;  with removal of polyps  . INTRAUTERINE DEVICE (IUD) INSERTION  03/21/2010  . LAPAROSCOPIC  GASTROTOMY W/ REPAIR OF ULCER    . PANNICULECTOMY    . ROTATOR CUFF REPAIR      FAMHx:  Family History  Problem Relation Age of Onset  . Hypertension Mother   . Kidney failure Mother   . Hypertension Father   . Diabetes Cousin   . Diabetes Sister   . Hypertension Sister   . Hypertension Brother   . Hypertension Maternal Aunt   . Hypertension Maternal Uncle   . Hypertension Maternal Grandmother   . Hypertension Maternal Grandfather   . Hypertension Paternal Grandmother   . Hypertension Paternal Grandfather     SOCHx:   reports that she quit smoking about 29 years ago. Her smoking use included cigarettes. She has a 1.00 pack-year smoking history. she has never used smokeless tobacco. She reports that she drinks alcohol. She reports that she does not use drugs.  ALLERGIES:  Allergies  Allergen Reactions  . Ambien [Zolpidem Tartrate]     "BLACK OUT", SLEEP DRIVING  . Geodon [Ziprasidone Hcl]     Seizure activity  . Lactulose Itching  . Ondansetron Nausea And Vomiting  . Quetiapine     Other reaction(s): Other (See Comments) Possible Hair Loss  . Sulfa Antibiotics Rash    ROS: Pertinent items noted in HPI and remainder of comprehensive ROS otherwise negative.  HOME MEDS: Current Outpatient Medications on File Prior to Visit  Medication  Sig Dispense Refill  . acetaminophen (TYLENOL 8 HOUR) 650 MG CR tablet Take 650 mg by mouth every 8 (eight) hours as needed for pain.    Marland Kitchen amLODipine (NORVASC) 5 MG tablet Take 1 tablet (5 mg total) by mouth daily. 30 tablet 3  . carvedilol (COREG) 12.5 MG tablet TAKE (1) TABLET BY MOUTH TWICE DAILY WITH A MEAL. 180 tablet 0  . diphenhydrAMINE (BENADRYL ALLERGY) 25 MG tablet Take 25 mg by mouth as needed.    . promethazine (PHENERGAN) 25 MG tablet Take 1 tablet (25 mg total) every 6 (six) hours as needed by mouth for nausea or vomiting. 30 tablet 0  . tiZANidine (ZANAFLEX) 4 MG tablet One tablet by mouth every 12 hours as needed for  spasm. 40 tablet 2  . topiramate (TOPAMAX) 100 MG tablet TAKE 2 AND 1/2 TABLETS(250 MG) BY MOUTH TWICE DAILY 150 tablet 0  . traMADol (ULTRAM) 50 MG tablet Take 1 tablet (50 mg total) by mouth every 6 (six) hours as needed. 90 tablet 0  . valACYclovir (VALTREX) 1000 MG tablet Take 0.5 tablets (500 mg total) by mouth daily. 30 tablet 5  . venlafaxine XR (EFFEXOR-XR) 150 MG 24 hr capsule Take 1 capsule (150 mg total) by mouth 2 (two) times daily. 180 capsule 0   No current facility-administered medications on file prior to visit.     LABS/IMAGING: No results found for this or any previous visit (from the past 48 hour(s)). No results found.  LIPID PANEL: No results found for: CHOL, TRIG, HDL, CHOLHDL, VLDL, LDLCALC, LDLDIRECT  WEIGHTS: Wt Readings from Last 3 Encounters:  03/08/17 163 lb (73.9 kg)  02/15/17 167 lb 12.8 oz (76.1 kg)  02/01/17 170 lb (77.1 kg)    VITALS: There were no vitals taken for this visit.  EXAM: General appearance: alert and no distress Neck: no carotid bruit, no JVD and thyroid not enlarged, symmetric, no tenderness/mass/nodules Lungs: clear to auscultation bilaterally Heart: regular rate and rhythm, S1, S2 normal, no murmur, click, rub or gallop Abdomen: soft, non-tender; bowel sounds normal; no masses,  no organomegaly Extremities: edema 1+ pitting edema Pulses: 2+ and symmetric Skin: Skin color, texture, turgor normal. No rashes or lesions Neurologic: Grossly normal Psych: Pleasant  EKG: Sinus rhythm first-degree AV block at 78- personally reviewed  ASSESSMENT: 1. Vasovagal syncope 2. ?seizure history - she feels she only had one remote event related to Geodon 3. Relative hypotension 4. LE edema  PLAN: 1.   Mrs. [name] had an episode of syncope which certainly had a positional component and is most consistent with vasovagal and/or orthostatic syncope.  She is on blood pressure medications and blood pressure appears to be fairly low today.  I  suspect that she would benefit from a decrease in her medications.  She has lower extremity edema which is likely attributable to amlodipine.  I advised her to decrease the dose from 5-2.5 mg.  I do not feel she needs any additional cardiac testing at this time.  She has a questionable seizure history but is not followed by a neurologist.  It could be reasonable for her to have a neurology evaluation to determine whether or not she truly has any seizures or whether there was a neurologic cause to her episode.  She did say that she had head pain, not specifically headache, associated with this event.  It was tender to the touch, which makes me think it could have been perhaps a cephalgic migraine.  Follow-up with me  as needed.  Pixie Casino, MD, [facility], [place] Director of the [facility]  [facility] Diplomate of the American Board of Clinical Lipidology Attending Cardiologist  Direct Dial: [phone]  Fax: [phone]  Website:  [email or web address] 03/29/2017, 8:32 AM

## 2017-03-30 ENCOUNTER — Encounter: Payer: Self-pay | Admitting: Nurse Practitioner

## 2017-03-30 ENCOUNTER — Ambulatory Visit (INDEPENDENT_AMBULATORY_CARE_PROVIDER_SITE_OTHER): Payer: Medicare Other | Admitting: Nurse Practitioner

## 2017-03-30 ENCOUNTER — Ambulatory Visit: Payer: Self-pay | Admitting: Nurse Practitioner

## 2017-03-30 VITALS — BP 152/98 | HR 75 | Temp 98.4°F | Resp 16 | Ht 59.0 in | Wt 157.1 lb

## 2017-03-30 DIAGNOSIS — F419 Anxiety disorder, unspecified: Secondary | ICD-10-CM | POA: Diagnosis not present

## 2017-03-30 DIAGNOSIS — I10 Essential (primary) hypertension: Secondary | ICD-10-CM

## 2017-03-30 DIAGNOSIS — R569 Unspecified convulsions: Secondary | ICD-10-CM

## 2017-03-30 DIAGNOSIS — F3160 Bipolar disorder, current episode mixed, unspecified: Secondary | ICD-10-CM | POA: Diagnosis not present

## 2017-03-30 MED ORDER — BUSPIRONE HCL 10 MG PO TABS: 10.0000 mg | ORAL_TABLET | Freq: Two times a day (BID) | ORAL | 1 refills | Status: DC

## 2017-03-30 MED ORDER — LEVETIRACETAM 500 MG PO TABS: 500.0000 mg | ORAL_TABLET | Freq: Two times a day (BID) | ORAL | 1 refills | Status: DC

## 2017-03-30 NOTE — Progress Notes (Signed)
Subjective:    Patient ID: [name], female    DOB: 1965/09/15, 52 y.o.   MRN: [id number]  HPI [name] presents today for a follow up visit of hypertension. Since our last visit she saw cardiology provider Dr Debara Pickett for syncope, hypotension and LE edema and her amlodipine was decreased from 5 to 2.5 daily. She was instructed to follow up with neurology, and she says she has an upcoming appointment with neurology in February, although I do not see an appointment in [facility], she assures me she is going to this appointment. She also says she has continued to follow with Dr hung for anemia. She is still awaiting recent referral to a new nephrologist, and says she will not go to [facility]. She also admits today that she has been "fired" by past 2 psychiatrists and continues to have uncontrolled anxiety despite taking her daily effexor but does not want to see another psychiatrist. According to her refills, she should be out of effexor but she assures me that she is taking it. She also continues to complain of posterior head pain today, which she says began when she had a possible seizure, fell, and went to the ER this past november. She says that she is upset that she has not had any imaging of her head for this injury. Together we reviewed her CT head done in ER on 11/5 which showed no acute abnormality, and she says she did not realize that she had a CT scan in the ER. She says the pain has not changed since onset. She also says that she is not aware she was supposed to be taking keppra 500BID for possible seizures, which was ordered when she was seen at [facility] for seizure activity. She denies any recent syncope, weakness, confusion, slurred speech, seizure activity.  Hypertension -maintained on carvediolol 12.5 BID, amlodipine 2.5 daily. Reports she has been taking both medications daily. She says her blood pressure is elevated in the office today because she is upset and was  rushing to get here on time. Denies vision changes, chest pain, shortness of breath.  BP Readings from Last 3 Encounters:  03/30/17 (!) 152/98  03/29/17 102/82  03/08/17 115/80    Review of Systems  See HPI  Past Medical History:  Diagnosis Date  . Anemia   . Anxiety   . Bipolar disorder ([place])   . Depression   . History of blood transfusion   . Hypertension   . IUD    HISTORY OF IUD --REMOVED IN 2006  . Seizures ([place]) [id number]   two seizures due to a med changes     Social History   Socioeconomic History  . Marital status: Single    Spouse name: Not on file  . Number of children: 0  . Years of education: 39  . Highest education level: Not on file  Social Needs  . Financial resource strain: Not on file  . Food insecurity - worry: Not on file  . Food insecurity - inability: Not on file  . Transportation needs - medical: Not on file  . Transportation needs - non-medical: Not on file  Occupational History  . Occupation: Retired  Tobacco Use  . Smoking status: Former Smoker    Packs/day: 0.25    Years: 4.00    Pack years: 1.00    Types: Cigarettes    Last attempt to quit: 03/21/1988    Years since quitting: 29.0  . Smokeless tobacco: Never Used  Substance and Sexual Activity  . Alcohol use: Yes    Comment: 1-2 times a year.   . Drug use: No  . Sexual activity: No  Other Topics Concern  . Not on file  Social History Narrative   Fun: Puzzles, watch mystery shows, read   Denies religious beliefs effecting health care.     Past Surgical History:  Procedure Laterality Date  . CARPAL TUNNEL RELEASE Right 07/08/2014   Procedure: RIGHT CARPAL TUNNEL RELEASE;  Surgeon: Sanjuana Kava, MD;  Location: [facility];  Service: Orthopedics;  Laterality: Right;  . CHOLECYSTECTOMY    . GASTRIC BYPASS  2000  . HYSTEROSCOPY W/D&C  11/14/2011   Procedure: DILATATION AND CURETTAGE /HYSTEROSCOPY;  Surgeon: Marylynn Pearson, MD;  Location: [facility];  Service: Gynecology;;  with removal  of polyps  . INTRAUTERINE DEVICE (IUD) INSERTION  03/21/2010  . LAPAROSCOPIC GASTROTOMY W/ REPAIR OF ULCER    . PANNICULECTOMY    . ROTATOR CUFF REPAIR      Family History  Problem Relation Age of Onset  . Hypertension Mother   . Kidney failure Mother   . Hypertension Father   . Diabetes Cousin   . Diabetes Sister   . Hypertension Sister   . Kidney disease Sister        dialysis  . Hypertension Brother   . Heart attack Brother   . Hypertension Maternal Aunt   . Hypertension Maternal Uncle   . Hypertension Maternal Grandmother   . Hypertension Maternal Grandfather   . Hypertension Paternal Grandmother   . Hypertension Paternal Grandfather   . Heart attack Brother   . Hypertension Brother   . Hypertension Sister     Allergies  Allergen Reactions  . Ambien [Zolpidem Tartrate]     "BLACK OUT", SLEEP DRIVING  . Geodon [Ziprasidone Hcl]     Seizure activity  . Lactulose Itching  . Ondansetron Nausea And Vomiting  . Quetiapine     Other reaction(s): Other (See Comments) Possible Hair Loss  . Sulfa Antibiotics Rash    Current Outpatient Medications on File Prior to Visit  Medication Sig Dispense Refill  . acetaminophen (TYLENOL 8 HOUR) 650 MG CR tablet Take 650 mg by mouth every 8 (eight) hours as needed for pain.    Marland Kitchen amLODipine (NORVASC) 5 MG tablet Take 1 tablet (5 mg total) by mouth daily. (Patient taking differently: Take 2.5 mg by mouth daily. ) 30 tablet 3  . carvedilol (COREG) 12.5 MG tablet TAKE (1) TABLET BY MOUTH TWICE DAILY WITH A MEAL. 180 tablet 0  . diphenhydrAMINE (BENADRYL ALLERGY) 25 MG tablet Take 25 mg by mouth as needed.    . promethazine (PHENERGAN) 25 MG tablet Take 1 tablet (25 mg total) every 6 (six) hours as needed by mouth for nausea or vomiting. 30 tablet 0  . tiZANidine (ZANAFLEX) 4 MG tablet One tablet by mouth every 12 hours as needed for spasm. 40 tablet 2  . topiramate (TOPAMAX) 100 MG tablet TAKE 2 AND 1/2 TABLETS(250 MG) BY MOUTH TWICE  DAILY 150 tablet 0  . traMADol (ULTRAM) 50 MG tablet Take 1 tablet (50 mg total) by mouth every 6 (six) hours as needed. 90 tablet 0  . valACYclovir (VALTREX) 1000 MG tablet Take 0.5 tablets (500 mg total) by mouth daily. 30 tablet 5  . venlafaxine XR (EFFEXOR-XR) 150 MG 24 hr capsule Take 1 capsule (150 mg total) by mouth 2 (two) times daily. 180 capsule 0   No current facility-administered medications on  file prior to visit.     BP (!) 152/98 (BP Location: Left Arm, Patient Position: Sitting, Cuff Size: Large)   Pulse 75   Temp 98.4 F (36.9 C) (Oral)   Resp 16   Ht 4\' 11"  (1.499 m)   Wt 157 lb 1.9 oz (71.3 kg)   SpO2 98%   BMI 31.73 kg/m        Objective:   Physical Exam  Constitutional: She is oriented to person, place, and time. She appears well-developed and well-nourished. No distress.  HENT:  Head: Normocephalic and atraumatic.  Eyes: Pupils are equal, round, and reactive to light.  Cardiovascular: Normal rate, regular rhythm, normal heart sounds and intact distal pulses.  Pulmonary/Chest: Effort normal and breath sounds normal.  Musculoskeletal: She exhibits no edema.  Neurological: She is alert and oriented to person, place, and time. She has normal strength. No cranial nerve deficit or sensory deficit. Coordination and gait normal.  Skin: Skin is warm and dry.  Psychiatric: She has a normal mood and affect. Her speech is normal. Judgment and thought content normal. She is agitated and aggressive.  Repetitive questioning and statements.      Assessment & Plan:  Again Today, We spent several minutes discussing the need for specialty follow up to ensure safe care of her multiple chronic conditions. She says she will be willing to go to [facility], which I discussed with our referral coordinator, and also continues to tell me she is going to neurology in February - although neurology referral note says she declined the appointment. She does seem to get  confused about medications and appointments, and she is also somewhat aggressive about her care needs- again today asking for valium refill or to be started on klonopin multiple times. I told her I am not going to give her valium or klonopin as there are safer options for her anxiety and she has been off her valium for some time now so no need to restart at this point. I Suspect that her poorly managed conditions including bipolar disorder and end stage renal disease are impairing her ability to make thoughtful decisions about her medical care. I hope that she will follow up with neuropsych for further evaluation of  her seizures and poorly controlled bipolar disorder.  I am going to have her return in about 2-3 weeks so we can see how her BP and anxiety are doing, and also if she has been to any follow up appointments  Seizure [facility]) We discussed seizure precautions and the importance of daily anti-seizure medication Medications ordered: - levETIRAcetam (KEPPRA) 500 MG tablet; Take 1 tablet (500 mg total) by mouth 2 (two) times daily.  Dispense: 60 tablet; Refill: 1 She says that she is seeing neurology in February, we discussed the importance of this appointment in management of seizures.

## 2017-03-30 NOTE — Patient Instructions (Signed)
START buspar 10 mg twice a day for anxiety.  START keppra 500 mg twice a day for seizures.  PLEASE KEEP YOUR FOLLOW UP WITH NEUROLOGY.  Id like to see you back in about 2-3 weeks, to see how you are doing.  It was good to see you. Thanks for letting me take care of you today :)

## 2017-04-02 ENCOUNTER — Encounter: Payer: Self-pay | Admitting: Nurse Practitioner

## 2017-04-02 NOTE — Assessment & Plan Note (Signed)
Continue effexor, although I am unusre if she is actually taking the effexor. We will add buspar to see if this improves her anxiety. Medications ordered: - busPIRone (BUSPAR) 10 MG tablet; Take 1 tablet (10 mg total) by mouth 2 (two) times daily.  Dispense: 60 tablet; Refill: 1

## 2017-04-02 NOTE — Assessment & Plan Note (Signed)
Slightly elevated reading today, low reading at cardiology appointment yesterday. We will continue medications at current dosage per cardiology adjustments yesterday and Will recheck at next follow up visit

## 2017-04-04 ENCOUNTER — Other Ambulatory Visit: Payer: Self-pay

## 2017-04-04 DIAGNOSIS — F3160 Bipolar disorder, current episode mixed, unspecified: Secondary | ICD-10-CM

## 2017-04-04 MED ORDER — VENLAFAXINE HCL ER 150 MG PO CP24: 150.0000 mg | ORAL_CAPSULE | Freq: Two times a day (BID) | ORAL | 0 refills | Status: DC

## 2017-04-05 ENCOUNTER — Other Ambulatory Visit: Payer: Self-pay

## 2017-04-05 MED ORDER — TOPIRAMATE 100 MG PO TABS: ORAL_TABLET | ORAL | 0 refills | Status: DC

## 2017-04-11 ENCOUNTER — Other Ambulatory Visit: Payer: Self-pay | Admitting: Orthopaedic Surgery

## 2017-04-12 ENCOUNTER — Other Ambulatory Visit: Payer: Self-pay | Admitting: Orthopaedic Surgery

## 2017-04-21 ENCOUNTER — Ambulatory Visit: Payer: Self-pay | Admitting: Nurse Practitioner

## 2017-04-25 ENCOUNTER — Ambulatory Visit (INDEPENDENT_AMBULATORY_CARE_PROVIDER_SITE_OTHER): Payer: Medicare Other | Admitting: Nurse Practitioner

## 2017-04-25 ENCOUNTER — Encounter: Payer: Self-pay | Admitting: Nurse Practitioner

## 2017-04-25 VITALS — BP 158/92 | HR 74 | Temp 98.6°F | Resp 16 | Ht 59.0 in | Wt 156.0 lb

## 2017-04-25 DIAGNOSIS — J309 Allergic rhinitis, unspecified: Secondary | ICD-10-CM | POA: Diagnosis not present

## 2017-04-25 DIAGNOSIS — Z9119 Patient's noncompliance with other medical treatment and regimen: Secondary | ICD-10-CM

## 2017-04-25 DIAGNOSIS — Z91199 Patient's noncompliance with other medical treatment and regimen due to unspecified reason: Secondary | ICD-10-CM

## 2017-04-25 DIAGNOSIS — F3160 Bipolar disorder, current episode mixed, unspecified: Secondary | ICD-10-CM

## 2017-04-25 DIAGNOSIS — I10 Essential (primary) hypertension: Secondary | ICD-10-CM | POA: Diagnosis not present

## 2017-04-25 MED ORDER — CARVEDILOL 12.5 MG PO TABS: ORAL_TABLET | ORAL | 0 refills | Status: DC

## 2017-04-25 NOTE — Patient Instructions (Addendum)
Please resume your carvedilol and continue taking your amlodipine 2.5 daily.  I'd like to see you back in about 2-3 weeks to see if your blood pressure has improved and see how your neurology appointment went.  Please call neurology to verify your appointment, as I do not see it in our system.  It was nice to see you today.  Thanks for letting me take care of you today :)   Mediterranean Diet A Mediterranean diet refers to food and lifestyle choices that are based on the traditions of countries located on the [employer]. This way of eating has been shown to help prevent certain conditions and improve outcomes for people who have chronic diseases, like kidney disease and heart disease. What are tips for following this plan? Lifestyle  Cook and eat meals together with your family, when possible.  Drink enough fluid to keep your urine clear or pale yellow.  Be physically active every day. This includes: ? Aerobic exercise like running or swimming. ? Leisure activities like gardening, walking, or housework.  Get 7-8 hours of sleep each night.  If recommended by your health care provider, drink red wine in moderation. This means 1 glass a day for nonpregnant women and 2 glasses a day for men. A glass of wine equals 5 oz (150 mL). Reading food labels  Check the serving size of packaged foods. For foods such as rice and pasta, the serving size refers to the amount of cooked product, not dry.  Check the total fat in packaged foods. Avoid foods that have saturated fat or trans fats.  Check the ingredients list for added sugars, such as corn syrup. Shopping  At the grocery store, buy most of your food from the areas near the walls of the store. This includes: ? Fresh fruits and vegetables (produce). ? Grains, beans, nuts, and seeds. Some of these may be available in unpackaged forms or large amounts (in bulk). ? Fresh seafood. ? Poultry and eggs. ? Low-fat dairy products.  Buy  whole ingredients instead of prepackaged foods.  Buy fresh fruits and vegetables in-season from local farmers markets.  Buy frozen fruits and vegetables in resealable bags.  If you do not have access to quality fresh seafood, buy precooked frozen shrimp or canned fish, such as tuna, salmon, or sardines.  Buy small amounts of raw or cooked vegetables, salads, or olives from the deli or salad bar at your store.  Stock your pantry so you always have certain foods on hand, such as olive oil, canned tuna, canned tomatoes, rice, pasta, and beans. Cooking  Cook foods with extra-virgin olive oil instead of using butter or other vegetable oils.  Have meat as a side dish, and have vegetables or grains as your main dish. This means having meat in small portions or adding small amounts of meat to foods like pasta or stew.  Use beans or vegetables instead of meat in common dishes like chili or lasagna.  Experiment with different cooking methods. Try roasting or broiling vegetables instead of steaming or sauteing them.  Add frozen vegetables to soups, stews, pasta, or rice.  Add nuts or seeds for added healthy fat at each meal. You can add these to yogurt, salads, or vegetable dishes.  Marinate fish or vegetables using olive oil, lemon juice, garlic, and fresh herbs. Meal planning  Plan to eat 1 vegetarian meal one day each week. Try to work up to 2 vegetarian meals, if possible.  Eat seafood 2 or more  times a week.  Have healthy snacks readily available, such as: ? Vegetable sticks with hummus. ? [place] yogurt. ? Fruit and nut trail mix.  Eat balanced meals throughout the week. This includes: ? Fruit: 2-3 servings a day ? Vegetables: 4-5 servings a day ? Low-fat dairy: 2 servings a day ? Fish, poultry, or lean meat: 1 serving a day ? Beans and legumes: 2 or more servings a week ? Nuts and seeds: 1-2 servings a day ? Whole grains: 6-8 servings a day ? Extra-virgin olive oil: 3-4  servings a day  Limit red meat and sweets to only a few servings a month What are my food choices?  Mediterranean diet ? Recommended ? Grains: Whole-grain pasta. Brown rice. Bulgar wheat. Polenta. Couscous. Whole-wheat bread. [place]. ? Vegetables: Artichokes. Beets. Broccoli. Cabbage. Carrots. Eggplant. Green beans. Chard. Kale. Spinach. Onions. Leeks. Peas. Squash. Tomatoes. Peppers. Radishes. ? Fruits: Apples. Apricots. Avocado. Berries. Bananas. Cherries. Dates. Figs. Grapes. Lemons. Melon. Oranges. Peaches. Plums. Pomegranate. ? Meats and other protein foods: Beans. Almonds. Sunflower seeds. Pine nuts. Peanuts. [place]. Salmon. Scallops. Shrimp. [place]. Tilapia. Clams. Oysters. Eggs. ? Dairy: Low-fat milk. Cheese. Greek yogurt. ? Beverages: Water. Red wine. Herbal tea. ? Fats and oils: Extra virgin olive oil. Avocado oil. Grape seed oil. ? Sweets and desserts: [place] yogurt with honey. Baked apples. Poached pears. Trail mix. ? Seasoning and other foods: Basil. Cilantro. Coriander. Cumin. Mint. Parsley. Sage. Rosemary. Tarragon. Garlic. Oregano. Thyme. Pepper. Balsalmic vinegar. Tahini. Hummus. Tomato sauce. Olives. Mushrooms. ? Limit these ? Grains: Prepackaged pasta or rice dishes. Prepackaged cereal with added sugar. ? Vegetables: Deep fried potatoes (french fries). ? Fruits: Fruit canned in syrup. ? Meats and other protein foods: Beef. Pork. Lamb. Poultry with skin. Hot dogs. [place] Salines. ? Dairy: Ice cream. Sour cream. Whole milk. ? Beverages: Juice. Sugar-sweetened soft drinks. Beer. Liquor and spirits. ? Fats and oils: Butter. Canola oil. Vegetable oil. Beef fat (tallow). Lard. ? Sweets and desserts: Cookies. Cakes. Pies. Candy. ? Seasoning and other foods: Mayonnaise. Premade sauces and marinades. ? The items listed may not be a complete list. Talk with your dietitian about what dietary choices are right for you. Summary  The Mediterranean diet includes both food and lifestyle  choices.  Eat a variety of fresh fruits and vegetables, beans, nuts, seeds, and whole grains.  Limit the amount of red meat and sweets that you eat.  Talk with your health care provider about whether it is safe for you to drink red wine in moderation. This means 1 glass a day for nonpregnant women and 2 glasses a day for men. A glass of wine equals 5 oz (150 mL). This information is not intended to replace advice given to you by your health care provider. Make sure you discuss any questions you have with your health care provider. Document Released: 10/29/2015 Document Revised: 12/01/2015 Document Reviewed: 10/29/2015 Elsevier Interactive Patient Education  [employer].

## 2017-04-25 NOTE — Progress Notes (Signed)
Name: [name]   MRN: [id number]    DOB: [birth year]   Date:04/25/2017       Progress Note  Subjective  Chief Complaint  Chief Complaint  Patient presents with  . Follow-up    HPI [name] is coming in today for a follow up of her chronic conditions. At her last appointment on 1/10, we spent a great deal of time discussing her chronic conditions and need for specialty care of her bipolar disorder, seizures, and chronic kidney disease. She told me that she has been "fired" by past 2 psychiatrists and continues to have uncontrolled anxiety despite taking her daily effexor but does not want to see another psychiatrist. I tried to refer her to psychiatry, neurology, nephrology back in November but she declined the appointments.She did follow up with cardiology due to syncope in December and she did follow up for an appointment on 03/30/17.  After our discussion about the need for specialty care on 1/10, she told me she did have an upcoming appointment with neurology, which I did not see in the [facility] system, and that she would be willing to see a nephrologist outside of [facility] so I did update the referral. Today she returns and says that her neurology appointment is coming up on 2/16, although again I do not see this in the Epic system. She also says that she forgot to tell me that she does have a nephrologist in [place], Dr Annabell Sabal, and that she is currently working with him for her chronic kidney disease. She tells me that she has had several family members on hemodialysis and that she will not go on hemodialysis. She says that if her kidneys do fail then she will be okay with passing away, she absolutely does not want hemodialysis.  Hypertension -She is maintained on coreg 12.5 daily and amlodipine 2.5 daily. Her amlodipine dosage was actually decreased from 5 to 2.5 at recent cardiology visit due to low readings. She has been out of coreg for about 2 weeks. Reports she does not  check her BP at home. Denies vision changes, chest pain, shortness of breath, edema.  BP Readings from Last 3 Encounters:  04/25/17 (!) 158/92  03/30/17 (!) 152/98  03/29/17 102/82   Ear itching- This is a new problem. She c/o itching in ears and nose this week. She denies any sinus pressure, ear drainage, itchy watery eyes, sneezing, sore throat, fevers. She was taking benadryl daily for allergies but stopped, she noticed her ears began to itch after that.  Patient Active Problem List   Diagnosis Date Noted  . Vasovagal syncope 03/29/2017  . LGSIL on Pap smear of cervix 04/26/2016  . Herpes simplex vulvovaginitis 03/01/2016  . Lower extremity edema 07/20/2015  . Chronic kidney disease, stage IV (severe) ([place]) 05/14/2015  . Proteinuria 05/14/2015  . H/O gastric bypass 01/21/2015  . Generalized headaches 01/05/2015  . Weight gain following gastric bypass surgery 08/13/2014  . Deficiency anemia 01/06/2014  . Iron deficiency anemia 01/06/2014  . Vitamin B12 deficiency (dietary) anemia 01/06/2014  . Essential hypertension, benign 01/25/2013  . Bipolar disorder [facility])     Past Surgical History:  Procedure Laterality Date  . CARPAL TUNNEL RELEASE Right 07/08/2014   Procedure: RIGHT CARPAL TUNNEL RELEASE;  Surgeon: Sanjuana Kava, MD;  Location: [facility];  Service: Orthopedics;  Laterality: Right;  . CHOLECYSTECTOMY    . GASTRIC BYPASS  2000  . HYSTEROSCOPY W/D&C  11/14/2011   Procedure: DILATATION AND CURETTAGE /HYSTEROSCOPY;  Surgeon: Marylynn Pearson, MD;  Location: [facility];  Service: Gynecology;;  with removal of polyps  . INTRAUTERINE DEVICE (IUD) INSERTION  03/21/2010  . LAPAROSCOPIC GASTROTOMY W/ REPAIR OF ULCER    . PANNICULECTOMY    . ROTATOR CUFF REPAIR      Family History  Problem Relation Age of Onset  . Hypertension Mother   . Kidney failure Mother   . Hypertension Father   . Diabetes Cousin   . Diabetes Sister   . Hypertension Sister   . Kidney disease Sister         dialysis  . Hypertension Brother   . Heart attack Brother   . Hypertension Maternal Aunt   . Hypertension Maternal Uncle   . Hypertension Maternal Grandmother   . Hypertension Maternal Grandfather   . Hypertension Paternal Grandmother   . Hypertension Paternal Grandfather   . Heart attack Brother   . Hypertension Brother   . Hypertension Sister     Social History   Socioeconomic History  . Marital status: Single    Spouse name: Not on file  . Number of children: 0  . Years of education: 39  . Highest education level: Not on file  Social Needs  . Financial resource strain: Not on file  . Food insecurity - worry: Not on file  . Food insecurity - inability: Not on file  . Transportation needs - medical: Not on file  . Transportation needs - non-medical: Not on file  Occupational History  . Occupation: Retired  Tobacco Use  . Smoking status: Former Smoker    Packs/day: 0.25    Years: 4.00    Pack years: 1.00    Types: Cigarettes    Last attempt to quit: 03/21/1988    Years since quitting: 29.1  . Smokeless tobacco: Never Used  Substance and Sexual Activity  . Alcohol use: Yes    Comment: 1-2 times a year.   . Drug use: No  . Sexual activity: No  Other Topics Concern  . Not on file  Social History Narrative   Fun: Puzzles, watch mystery shows, read   Denies religious beliefs effecting health care.      Current Outpatient Medications:  .  acetaminophen (TYLENOL 8 HOUR) 650 MG CR tablet, Take 650 mg by mouth every 8 (eight) hours as needed for pain., Disp: , Rfl:  .  amLODipine (NORVASC) 5 MG tablet, Take 1 tablet (5 mg total) by mouth daily. (Patient taking differently: Take 2.5 mg by mouth daily. ), Disp: 30 tablet, Rfl: 3 .  busPIRone (BUSPAR) 10 MG tablet, Take 1 tablet (10 mg total) by mouth 2 (two) times daily., Disp: 60 tablet, Rfl: 1 .  carvedilol (COREG) 12.5 MG tablet, TAKE (1) TABLET BY MOUTH TWICE DAILY WITH A MEAL., Disp: 180 tablet, Rfl: 0 .   diphenhydrAMINE (BENADRYL ALLERGY) 25 MG tablet, Take 25 mg by mouth as needed., Disp: , Rfl:  .  levETIRAcetam (KEPPRA) 500 MG tablet, Take 1 tablet (500 mg total) by mouth 2 (two) times daily., Disp: 60 tablet, Rfl: 1 .  promethazine (PHENERGAN) 25 MG tablet, Take 1 tablet (25 mg total) every 6 (six) hours as needed by mouth for nausea or vomiting., Disp: 30 tablet, Rfl: 0 .  tiZANidine (ZANAFLEX) 4 MG tablet, One tablet by mouth every 12 hours as needed for spasm., Disp: 40 tablet, Rfl: 2 .  topiramate (TOPAMAX) 100 MG tablet, TAKE 2 AND 1/2 TABLETS(250 MG) BY MOUTH TWICE DAILY, Disp: 150 tablet,  Rfl: 0 .  traMADol (ULTRAM) 50 MG tablet, TAKE 1 TABLET(50 MG) BY MOUTH EVERY 6 HOURS AS NEEDED, Disp: 90 tablet, Rfl: 1 .  traMADol (ULTRAM) 50 MG tablet, TAKE 1 TABLET(50 MG) BY MOUTH EVERY 6 HOURS AS NEEDED, Disp: 90 tablet, Rfl: 2 .  valACYclovir (VALTREX) 1000 MG tablet, Take 0.5 tablets (500 mg total) by mouth daily., Disp: 30 tablet, Rfl: 5 .  venlafaxine XR (EFFEXOR-XR) 150 MG 24 hr capsule, Take 1 capsule (150 mg total) by mouth 2 (two) times daily., Disp: 180 capsule, Rfl: 0  Allergies  Allergen Reactions  . Ambien [Zolpidem Tartrate]     "BLACK OUT", SLEEP DRIVING  . Geodon [Ziprasidone Hcl]     Seizure activity  . Lactulose Itching  . Ondansetron Nausea And Vomiting  . Quetiapine     Other reaction(s): Other (See Comments) Possible Hair Loss  . Sulfa Antibiotics Rash     ROS See HPI  Objective  Vitals:   04/25/17 1124  BP: (!) 158/92  Pulse: 74  Resp: 16  Temp: 98.6 F (37 C)  TempSrc: Oral  SpO2: 98%  Weight: 156 lb (70.8 kg)  Height: 4\' 11"  (1.499 m)   Body mass index is 31.51 kg/m.  Physical Exam Vital signs reviewed. Constitutional: Patient appears well-developed and well-nourished. No distress.  HENT: Head: Normocephalic and atraumatic. Ears: Bilateral TMs without erythema or effusion; Nose: Nose normal. Mouth/Throat: Oropharynx is clear and moist. No  oropharyngeal exudate.  Eyes: Conjunctivae and EOM are normal. Pupils are equal, round, and reactive to light. No scleral icterus.  Neck: Normal range of motion. Neck supple. Cardiovascular: Normal rate, regular rhythm and normal heart sounds.  No murmur heard.  Distal pulses intact. Pulmonary/Chest: Effort normal and breath sounds normal. No respiratory distress. Abdominal: Soft. Bowel sounds are normal, no distension. Musculoskeletal: Normal range of motion, no joint effusions. No gross deformities Neurological: She is alert and oriented to person, place, and time. Coordination, balance, strength, speech and gait are normal.  Skin: Skin is warm and dry. No rash noted. No erythema.  Psychiatric: Patient has a normal mood. Pt has a flat affect.  Assessment & Plan RTC in about 2 weeks for follow up of blood pressure, chronic problems   Allergic rhinitis, unspecified seasonality, unspecified trigger PE is normal Itching could be related to allergies, especially since it started after she stopped taking benadryl Offered Rx for claritin, as benadryl can cause drowsiness, but she'd prefer to resume benadryl She will F/u for continued symptoms

## 2017-04-27 ENCOUNTER — Encounter: Payer: Self-pay | Admitting: Nurse Practitioner

## 2017-04-27 DIAGNOSIS — Z9114 Patient's other noncompliance with medication regimen: Secondary | ICD-10-CM | POA: Insufficient documentation

## 2017-04-27 DIAGNOSIS — Z9119 Patient's noncompliance with other medical treatment and regimen: Secondary | ICD-10-CM

## 2017-04-27 NOTE — Assessment & Plan Note (Signed)
She will resume coreg and continue amlodipine 2.5 daily per cardiology plan We also discussed the role of healthy diet and exercise in the management of hypertension- See AVS for education provided to patient - carvedilol (COREG) 12.5 MG tablet; TAKE (1) TABLET BY MOUTH TWICE DAILY WITH A MEAL.  Dispense: 180 tablet; Refill: 0 RTC in about 2 weeks for follow up of blood pressure

## 2017-04-27 NOTE — Assessment & Plan Note (Signed)
She has been referred to neurology for neuropsychiatry eval She does say she has scheduled an appointment, although it is not viewable in our system Instructed her to call and verify appointment Will follow up with her when she returns for BP recheck

## 2017-04-27 NOTE — Assessment & Plan Note (Signed)
Again, I am getting multiple stories from her about follow up appointments, specialty providers, unsure if she is taking her medications. We again spent several minutes discussing her advanced kidney diease and need for specialty care related to bipolar disorder. We discussed that the risks of her unmanaged illnesses could lead to stroke, heart attack, kidney failure and in worse case death. She says that she understands. I do believe her unmanagend bipolar do could be contributing to her illogical thinking, and I am taking the best care of her that I can in the given circumstances. We will see if she does in fact follow with neurology and how else I may be able to help her when she returns for next OV- could see if she is eligible for any home health resources that could assist?

## 2017-05-17 ENCOUNTER — Ambulatory Visit: Payer: Self-pay | Admitting: Orthopaedic Surgery

## 2017-05-18 ENCOUNTER — Ambulatory Visit (INDEPENDENT_AMBULATORY_CARE_PROVIDER_SITE_OTHER): Payer: Medicare Other | Admitting: Orthopaedic Surgery

## 2017-05-18 ENCOUNTER — Encounter: Payer: Self-pay | Admitting: Orthopaedic Surgery

## 2017-05-18 VITALS — BP 177/103 | HR 87 | Temp 97.8°F | Ht 59.0 in | Wt 156.0 lb

## 2017-05-18 DIAGNOSIS — M25511 Pain in right shoulder: Secondary | ICD-10-CM

## 2017-05-18 DIAGNOSIS — G8929 Other chronic pain: Secondary | ICD-10-CM

## 2017-05-18 DIAGNOSIS — M25512 Pain in left shoulder: Secondary | ICD-10-CM | POA: Diagnosis not present

## 2017-05-18 MED ORDER — TRAMADOL HCL 50 MG PO TABS: ORAL_TABLET | ORAL | 3 refills | Status: DC

## 2017-05-18 MED ORDER — TIZANIDINE HCL 4 MG PO TABS
ORAL_TABLET | ORAL | 3 refills | Status: DC
Start: 2017-05-18 — End: 2017-09-17

## 2017-05-18 NOTE — Progress Notes (Signed)
Patient XN:[id number] [name], female DOB:[birth year], 52 y.o. KGU:[id number]  Chief Complaint  Patient presents with  . Shoulder Pain    right    HPI  [name] is a 52 y.o. female who has had more severe pain in her shoulders.  We have had a lot of cold rainy days and she has had marked pain.  She called to be seen earlier and was told the schedule was full.  She has taken her medicine which helped.  She is better now.  She is upset she could not be seen when she called.  I told her I would check on this.  She has no paresthesias.  She has had spasms in the shoulder area which the medicine helped.  She has no trauma. HPI  Body mass index is 31.51 kg/m.  ROS  Review of Systems  HENT: Negative for congestion.   Respiratory: Negative for cough and shortness of breath.   Cardiovascular: Negative for chest pain and leg swelling.  Endocrine: Positive for cold intolerance.  Musculoskeletal: Positive for arthralgias.  Allergic/Immunologic: Positive for environmental allergies.  All other systems reviewed and are negative.   Past Medical History:  Diagnosis Date  . Anemia   . Anxiety   . Bipolar disorder ([place])   . Depression   . History of blood transfusion   . Hypertension   . IUD    HISTORY OF IUD --REMOVED IN 2006  . Seizures ([place]) [id number]   two seizures due to a med changes    Past Surgical History:  Procedure Laterality Date  . CARPAL TUNNEL RELEASE Right 07/08/2014   Procedure: RIGHT CARPAL TUNNEL RELEASE;  Surgeon: Sanjuana Kava, MD;  Location: [facility];  Service: Orthopedics;  Laterality: Right;  . CHOLECYSTECTOMY    . GASTRIC BYPASS  2000  . HYSTEROSCOPY W/Nicole&C  11/14/2011   Procedure: DILATATION AND CURETTAGE /HYSTEROSCOPY;  Surgeon: Marylynn Pearson, MD;  Location: [facility];  Service: Gynecology;;  with removal of polyps  . INTRAUTERINE DEVICE (IUD) INSERTION  03/21/2010  . LAPAROSCOPIC GASTROTOMY W/ REPAIR OF ULCER    . PANNICULECTOMY    . ROTATOR CUFF REPAIR       Family History  Problem Relation Age of Onset  . Hypertension Mother   . Kidney failure Mother   . Hypertension Father   . Diabetes Cousin   . Diabetes Sister   . Hypertension Sister   . Kidney disease Sister        dialysis  . Hypertension Brother   . Heart attack Brother   . Hypertension Maternal Aunt   . Hypertension Maternal Uncle   . Hypertension Maternal Grandmother   . Hypertension Maternal Grandfather   . Hypertension Paternal Grandmother   . Hypertension Paternal Grandfather   . Heart attack Brother   . Hypertension Brother   . Hypertension Sister     Social History Social History   Tobacco Use  . Smoking status: Former Smoker    Packs/day: 0.25    Years: 4.00    Pack years: 1.00    Types: Cigarettes    Last attempt to quit: 03/21/1988    Years since quitting: 29.1  . Smokeless tobacco: Never Used  Substance Use Topics  . Alcohol use: Yes    Comment: 1-2 times a year.   . Drug use: No    Allergies  Allergen Reactions  . Ambien [Zolpidem Tartrate]     "BLACK OUT", SLEEP DRIVING  . Geodon [Ziprasidone Hcl]  Seizure activity  . Lactulose Itching  . Ondansetron Nausea And Vomiting  . Quetiapine     Other reaction(s): Other (See Comments) Possible Hair Loss  . Sulfa Antibiotics Rash    Current Outpatient Medications  Medication Sig Dispense Refill  . acetaminophen (TYLENOL 8 HOUR) 650 MG CR tablet Take 650 mg by mouth every 8 (eight) hours as needed for pain.    Marland Kitchen amLODipine (NORVASC) 5 MG tablet Take 1 tablet (5 mg total) by mouth daily. (Patient taking differently: Take 2.5 mg by mouth daily. ) 30 tablet 3  . busPIRone (BUSPAR) 10 MG tablet Take 1 tablet (10 mg total) by mouth 2 (two) times daily. 60 tablet 1  . carvedilol (COREG) 12.5 MG tablet TAKE (1) TABLET BY MOUTH TWICE DAILY WITH A MEAL. 180 tablet 0  . diphenhydrAMINE (BENADRYL ALLERGY) 25 MG tablet Take 25 mg by mouth as needed.    . levETIRAcetam (KEPPRA) 500 MG tablet Take 1 tablet  (500 mg total) by mouth 2 (two) times daily. 60 tablet 1  . promethazine (PHENERGAN) 25 MG tablet Take 1 tablet (25 mg total) every 6 (six) hours as needed by mouth for nausea or vomiting. 30 tablet 0  . tiZANidine (ZANAFLEX) 4 MG tablet One tablet by mouth every 12 hours as needed for spasm. 60 tablet 3  . topiramate (TOPAMAX) 100 MG tablet TAKE 2 AND 1/2 TABLETS(250 MG) BY MOUTH TWICE DAILY 150 tablet 0  . traMADol (ULTRAM) 50 MG tablet TAKE 1 TABLET(50 MG) BY MOUTH EVERY 6 HOURS AS NEEDED 90 tablet 1  . traMADol (ULTRAM) 50 MG tablet TAKE 1 TABLET(50 MG) BY MOUTH EVERY 6 HOURS AS NEEDED 90 tablet 3  . valACYclovir (VALTREX) 1000 MG tablet Take 0.5 tablets (500 mg total) by mouth daily. 30 tablet 5  . venlafaxine XR (EFFEXOR-XR) 150 MG 24 hr capsule Take 1 capsule (150 mg total) by mouth 2 (two) times daily. 180 capsule 0   No current facility-administered medications for this visit.      Physical Exam  Blood pressure (!) 177/103, pulse 87, temperature 97.8 F (36.6 C), height 4\' 11"  (1.499 m), weight 156 lb (70.8 kg).  Constitutional: overall normal hygiene, normal nutrition, well developed, normal grooming, normal body habitus. Assistive device:none  Musculoskeletal: gait and station Limp none, muscle tone and strength are normal, no tremors or atrophy is present.  .  Neurological: coordination overall normal.  Deep tendon reflex/nerve stretch intact.  Sensation normal.  Cranial nerves II-XII intact.   Skin:   Normal overall no scars, lesions, ulcers or rashes. No psoriasis.  Psychiatric: Alert and oriented x 3.  Recent memory intact, remote memory unclear.  Normal mood and affect. Well groomed.  Good eye contact.  Cardiovascular: overall no swelling, no varicosities, no edema bilaterally, normal temperatures of the legs and arms, no clubbing, cyanosis and good capillary refill.  Lymphatic: palpation is normal.  Examination of bilaterally Upper Extremity is  done.  Inspection:   Overall:  Elbow non-tender without crepitus or defects, forearm non-tender without crepitus or defects, wrist non-tender without crepitus or defects, hand non-tender.    Shoulder: with glenohumeral joint tenderness, without effusion.   Upper arm: without swelling and tenderness   Range of motion:   Overall:  Full range of motion of the elbow, full range of motion of wrist and full range of motion in fingers.   Shoulder:  bilaterally  160 degrees forward flexion; 120 degrees abduction; 30 degrees internal rotation, 30 degrees external rotation,  10 degrees extension, 40 degrees adduction.   Stability:   Overall:  Shoulder, elbow and wrist stable   Strength and Tone:   Overall full shoulder muscles strength, full upper arm strength and normal upper arm bulk and tone.  All other systems reviewed and are negative   The patient has been educated about the nature of the problem(s) and counseled on treatment options.  The patient appeared to understand what I have discussed and is in agreement with it.  Encounter Diagnoses  Name Primary?  . Chronic right shoulder pain Yes  . Chronic left shoulder pain     PLAN Call if any problems.  Precautions discussed.  Continue current medications.   Return to clinic 3 months   Electronically Signed Sanjuana Kava, MD 2/28/201910:20 AM

## 2017-05-25 ENCOUNTER — Ambulatory Visit (INDEPENDENT_AMBULATORY_CARE_PROVIDER_SITE_OTHER): Payer: Medicare Other | Admitting: Nurse Practitioner

## 2017-05-25 ENCOUNTER — Encounter: Payer: Self-pay | Admitting: Nurse Practitioner

## 2017-05-25 VITALS — BP 172/104 | HR 86 | Resp 16 | Ht 59.0 in | Wt 155.4 lb

## 2017-05-25 DIAGNOSIS — R569 Unspecified convulsions: Secondary | ICD-10-CM

## 2017-05-25 DIAGNOSIS — Z9119 Patient's noncompliance with other medical treatment and regimen: Secondary | ICD-10-CM | POA: Diagnosis not present

## 2017-05-25 DIAGNOSIS — Z91199 Patient's noncompliance with other medical treatment and regimen due to unspecified reason: Secondary | ICD-10-CM

## 2017-05-25 DIAGNOSIS — I10 Essential (primary) hypertension: Secondary | ICD-10-CM | POA: Diagnosis not present

## 2017-05-25 MED ORDER — AMLODIPINE BESYLATE 10 MG PO TABS: 5.0000 mg | ORAL_TABLET | Freq: Every day | ORAL | 1 refills | Status: DC

## 2017-05-25 NOTE — Progress Notes (Signed)
Name: [name]   MRN: [id number]    DOB: 1965/12/24   Date:05/25/2017       Progress Note  Subjective  Chief Complaint  Chief Complaint  Patient presents with  . Follow-up    Blood pressure    HPI   Hypertension -maintained on coreg 12.5, amlodipine 2.5 She was out of her medications at her last OV but out at last visit so refills were given and she was instructed to resume medications daily She says that she has been taking both medications daily without noted adverse medication effects. Reports she does not check her blood pressure routinely at home Denies headaches, vision changes, chest pain, shortness of breath, edema. She says today that she has lots of stressful factors and "until they get settled my blood pressure will stay up."  BP Readings from Last 3 Encounters:  05/25/17 (!) 172/104  05/18/17 (!) 177/103  04/25/17 (!) 158/92   Seizures- Question for seizures on multiple ER visits at the end of last year She was placed on keppra for seizures at one of her ER visits and instructed to follow up with neurology At her office visit with me in January, it looked like she was not taking the keppra as instructed and we discussed the importance of taking this medication daily until follow up with neurology Again at her office visit in February she gave conflicting stories about following up with neurology Today, she has still not followed up with neurology and it is also questionable as to whether she is taking the keppra She tells me today that she thinks she was misdiagnosed and that she does not think she ever had a seizure She denies any weakness, falls, syncope, convulsions, tremor.   Patient Active Problem List   Diagnosis Date Noted  . Noncompliance 04/27/2017  . Vasovagal syncope 03/29/2017  . LGSIL on Pap smear of cervix 04/26/2016  . Herpes simplex vulvovaginitis 03/01/2016  . Lower extremity edema 07/20/2015  . Chronic kidney disease, stage IV (severe)  ([place]) 05/14/2015  . Proteinuria 05/14/2015  . Generalized headaches 01/05/2015  . Weight gain following gastric bypass surgery 08/13/2014  . Deficiency anemia 01/06/2014  . Iron deficiency anemia 01/06/2014  . Vitamin B12 deficiency (dietary) anemia 01/06/2014  . Essential hypertension, benign 01/25/2013  . Bipolar disorder [facility])     Past Surgical History:  Procedure Laterality Date  . CARPAL TUNNEL RELEASE Right 07/08/2014   Procedure: RIGHT CARPAL TUNNEL RELEASE;  Surgeon: Sanjuana Kava, MD;  Location: [facility];  Service: Orthopedics;  Laterality: Right;  . CHOLECYSTECTOMY    . GASTRIC BYPASS  2000  . HYSTEROSCOPY W/D&C  11/14/2011   Procedure: DILATATION AND CURETTAGE /HYSTEROSCOPY;  Surgeon: Marylynn Pearson, MD;  Location: [facility];  Service: Gynecology;;  with removal of polyps  . INTRAUTERINE DEVICE (IUD) INSERTION  03/21/2010  . LAPAROSCOPIC GASTROTOMY W/ REPAIR OF ULCER    . PANNICULECTOMY    . ROTATOR CUFF REPAIR      Family History  Problem Relation Age of Onset  . Hypertension Mother   . Kidney failure Mother   . Hypertension Father   . Diabetes Cousin   . Diabetes Sister   . Hypertension Sister   . Kidney disease Sister        dialysis  . Hypertension Brother   . Heart attack Brother   . Hypertension Maternal Aunt   . Hypertension Maternal Uncle   . Hypertension Maternal Grandmother   . Hypertension Maternal Grandfather   .  Hypertension Paternal Grandmother   . Hypertension Paternal Grandfather   . Heart attack Brother   . Hypertension Brother   . Hypertension Sister     Social History   Socioeconomic History  . Marital status: Single    Spouse name: Not on file  . Number of children: 0  . Years of education: 62  . Highest education level: Not on file  Social Needs  . Financial resource strain: Not on file  . Food insecurity - worry: Not on file  . Food insecurity - inability: Not on file  . Transportation needs - medical: Not on file  . Transportation  needs - non-medical: Not on file  Occupational History  . Occupation: Retired  Tobacco Use  . Smoking status: Former Smoker    Packs/day: 0.25    Years: 4.00    Pack years: 1.00    Types: Cigarettes    Last attempt to quit: 03/21/1988    Years since quitting: 29.1  . Smokeless tobacco: Never Used  Substance and Sexual Activity  . Alcohol use: Yes    Comment: 1-2 times a year.   . Drug use: No  . Sexual activity: No  Other Topics Concern  . Not on file  Social History Narrative   Fun: Puzzles, watch mystery shows, read   Denies religious beliefs effecting health care.      Current Outpatient Medications:  .  acetaminophen (TYLENOL 8 HOUR) 650 MG CR tablet, Take 650 mg by mouth every 8 (eight) hours as needed for pain., Disp: , Rfl:  .  amLODipine (NORVASC) 5 MG tablet, Take 1 tablet (5 mg total) by mouth daily. (Patient taking differently: Take 2.5 mg by mouth daily. ), Disp: 30 tablet, Rfl: 3 .  busPIRone (BUSPAR) 10 MG tablet, Take 1 tablet (10 mg total) by mouth 2 (two) times daily., Disp: 60 tablet, Rfl: 1 .  carvedilol (COREG) 12.5 MG tablet, TAKE (1) TABLET BY MOUTH TWICE DAILY WITH A MEAL., Disp: 180 tablet, Rfl: 0 .  diphenhydrAMINE (BENADRYL ALLERGY) 25 MG tablet, Take 25 mg by mouth as needed., Disp: , Rfl:  .  levETIRAcetam (KEPPRA) 500 MG tablet, Take 1 tablet (500 mg total) by mouth 2 (two) times daily., Disp: 60 tablet, Rfl: 1 .  promethazine (PHENERGAN) 25 MG tablet, Take 1 tablet (25 mg total) every 6 (six) hours as needed by mouth for nausea or vomiting., Disp: 30 tablet, Rfl: 0 .  tiZANidine (ZANAFLEX) 4 MG tablet, One tablet by mouth every 12 hours as needed for spasm., Disp: 60 tablet, Rfl: 3 .  topiramate (TOPAMAX) 100 MG tablet, TAKE 2 AND 1/2 TABLETS(250 MG) BY MOUTH TWICE DAILY, Disp: 150 tablet, Rfl: 0 .  traMADol (ULTRAM) 50 MG tablet, TAKE 1 TABLET(50 MG) BY MOUTH EVERY 6 HOURS AS NEEDED, Disp: 90 tablet, Rfl: 1 .  traMADol (ULTRAM) 50 MG tablet, TAKE 1  TABLET(50 MG) BY MOUTH EVERY 6 HOURS AS NEEDED, Disp: 90 tablet, Rfl: 3 .  valACYclovir (VALTREX) 1000 MG tablet, Take 0.5 tablets (500 mg total) by mouth daily., Disp: 30 tablet, Rfl: 5 .  venlafaxine XR (EFFEXOR-XR) 150 MG 24 hr capsule, Take 1 capsule (150 mg total) by mouth 2 (two) times daily., Disp: 180 capsule, Rfl: 0  Allergies  Allergen Reactions  . Ambien [Zolpidem Tartrate]     "BLACK OUT", SLEEP DRIVING  . Geodon [Ziprasidone Hcl]     Seizure activity  . Lactulose Itching  . Ondansetron Nausea And Vomiting  . Quetiapine  Other reaction(s): Other (See Comments) Possible Hair Loss  . Sulfa Antibiotics Rash     ROS See HPI  Objective  Vitals:   05/25/17 1131  BP: (!) 172/104  Pulse: 86  Resp: 16  SpO2: 93%  Weight: 155 lb 6.4 oz (70.5 kg)  Height: 4\' 11"  (1.499 m)    Body mass index is 31.39 kg/m.  Physical Exam Vital signs reviewed. Constitutional: Patient appears well-developed and well-nourished. No distress.  HENT: Head: Normocephalic and atraumatic. Nose: Nose normal. Mouth/Throat: Oropharynx is clear and moist.  Eyes: Conjunctivae and EOM are normal. Pupils are equal, round, and reactive to light. No scleral icterus.  Neck: Normal range of motion. Neck supple. Cardiovascular: Normal rate, regular rhythm and normal heart sounds.  No murmur heard.  Distal pulses intact. Pulmonary/Chest: Effort normal and breath sounds normal. No respiratory distress. Musculoskeletal: Normal range of motion, No gross deformities Neurological: She is alert and oriented to person, place, and time. Coordination, balance, strength, speech and gait are normal.  Skin: Skin is warm and dry. No rash noted. No erythema.  Psychiatric: Patient has a normal mood. Pt has a flat affect.   Assessment & Plan RTC in 2 weeks for F/U of HTN- increasing amlodipine dosage  Seizures ([place]) Again we discussed the importance of following up with neurology for at least an initial  evaluation of her "seizure" episodes this past fall and winter and I urged her to make it to her follow up appointment with neurology At this time, it appears she is scheduled with neurology in May She was also given repeat instructions not to drive until she is free of any seizure activity for 6 months or until she sees neurology - Ambulatory referral to Neurology

## 2017-05-25 NOTE — Patient Instructions (Addendum)
Please increase your amlodipine from 5 to 10mg  daily. Continue your coreg.  Please return in about 2 weeks for follow up of your blood pressure.  YOU MUST FOLLOW UP WITH NEUROLOGY TO MANAGE POSSIBLE SEIZURES AND SEIZURE MEDICATIONS.  YOU ARE LEGALLY NOT SUPPOSED TO DRIVE FOR 6 MONTHS AFTER ANY SEIZURE ACTIVITY.

## 2017-06-01 ENCOUNTER — Telehealth: Payer: Self-pay | Admitting: Nurse Practitioner

## 2017-06-01 NOTE — Telephone Encounter (Signed)
Faxed ROI Authorization to Doctor Marcello Moores Stem [phone] mc

## 2017-06-02 ENCOUNTER — Telehealth: Payer: Self-pay

## 2017-06-02 NOTE — Telephone Encounter (Signed)
Faxed ROI Authorization to [facility] (Doctor HUng)

## 2017-06-06 ENCOUNTER — Other Ambulatory Visit: Payer: Self-pay

## 2017-06-06 MED ORDER — TOPIRAMATE 100 MG PO TABS: ORAL_TABLET | ORAL | 0 refills | Status: DC

## 2017-06-11 ENCOUNTER — Encounter: Payer: Self-pay | Admitting: Nurse Practitioner

## 2017-06-11 NOTE — Assessment & Plan Note (Signed)
Again, just as at the last OV, I am getting multiple stories from her about follow up appointments, specialty providers, unsure if she is taking her medications. We again discussed that the risks of her unmanaged illnesses could lead to stroke, heart attack, kidney failure and in worse case death. She says that she understands. I do believe her unmanagend bipolar do could be contributing to her illogical thinking, and I am taking the best care of her that I can in the given circumstances. We will see if she does in fact follow with neurology and I have requested records from her other reported providers today

## 2017-06-11 NOTE — Assessment & Plan Note (Addendum)
On her past several visits, there has been questionable medication compliance and failure of patient to follow up as instructed Cardiology actually reduced her dosage of amlodipine to 2.5 daily in January due to low readings, however her readings have been quite elevated on last few clinic readings. Per her med rec today it does look like she is getting her medications refilled on time We discussed risks of uncontrolled hypertension including heart attack, stroke and kidney failure and recommended increasing amlodipine to 10 mg today which she was agreeable to this change, will continue coreg at current dosage She continues to tell me that she is following up with nephrologist in [place] - Dr Stem and I will request records from this office today - amLODipine (NORVASC) 10 MG tablet; Take 0.5 tablets (5 mg total) by mouth daily.  Dispense: 30 tablet; Refill: 1

## 2017-06-15 ENCOUNTER — Other Ambulatory Visit: Payer: Self-pay

## 2017-06-15 DIAGNOSIS — R569 Unspecified convulsions: Secondary | ICD-10-CM

## 2017-06-15 MED ORDER — LEVETIRACETAM 500 MG PO TABS: 500.0000 mg | ORAL_TABLET | Freq: Two times a day (BID) | ORAL | 1 refills | Status: DC

## 2017-06-30 ENCOUNTER — Other Ambulatory Visit: Payer: Self-pay

## 2017-06-30 DIAGNOSIS — I10 Essential (primary) hypertension: Secondary | ICD-10-CM

## 2017-06-30 MED ORDER — CARVEDILOL 12.5 MG PO TABS: ORAL_TABLET | ORAL | 0 refills | Status: DC

## 2017-07-06 ENCOUNTER — Ambulatory Visit (INDEPENDENT_AMBULATORY_CARE_PROVIDER_SITE_OTHER): Payer: Medicare Other | Admitting: Nurse Practitioner

## 2017-07-06 ENCOUNTER — Encounter: Payer: Self-pay | Admitting: Nurse Practitioner

## 2017-07-06 VITALS — BP 162/104 | HR 104 | Temp 98.0°F | Resp 16 | Ht 59.0 in | Wt 156.0 lb

## 2017-07-06 DIAGNOSIS — I10 Essential (primary) hypertension: Secondary | ICD-10-CM | POA: Diagnosis not present

## 2017-07-06 DIAGNOSIS — R569 Unspecified convulsions: Secondary | ICD-10-CM | POA: Insufficient documentation

## 2017-07-06 DIAGNOSIS — F319 Bipolar disorder, unspecified: Secondary | ICD-10-CM

## 2017-07-06 MED ORDER — TOPIRAMATE 100 MG PO TABS: ORAL_TABLET | ORAL | 0 refills | Status: DC

## 2017-07-06 MED ORDER — AMLODIPINE BESYLATE 10 MG PO TABS: 10.0000 mg | ORAL_TABLET | Freq: Every day | ORAL | 1 refills | Status: DC

## 2017-07-06 NOTE — Assessment & Plan Note (Signed)
Noncompliant with medications Agreeable to psychiatry referral today - Ambulatory referral to Psychiatry

## 2017-07-06 NOTE — Patient Instructions (Addendum)
I have sent refills of your medications requested.  Please continue taking your amlodipine and carvedilol daily.  I have placed a referral to psychiatry. Our office will call you to schedule this appointment. You should hear from our office in 7-10 days.  Please return in about 3 months after you see psychiatry and neurology, I want to see how you are doing.   Hypertension Hypertension is another name for high blood pressure. High blood pressure forces your heart to work harder to pump blood. This can cause problems over time. There are two numbers in a blood pressure reading. There is a top number (systolic) over a bottom number (diastolic). It is best to have a blood pressure below 120/80. Healthy choices can help lower your blood pressure. You may need medicine to help lower your blood pressure if:  Your blood pressure cannot be lowered with healthy choices.  Your blood pressure is higher than 130/80.  Follow these instructions at home: Eating and drinking  If directed, follow the DASH eating plan. This diet includes: ? Filling half of your plate at each meal with fruits and vegetables. ? Filling one quarter of your plate at each meal with whole grains. Whole grains include whole wheat pasta, brown rice, and whole grain bread. ? Eating or drinking low-fat dairy products, such as skim milk or low-fat yogurt. ? Filling one quarter of your plate at each meal with low-fat (lean) proteins. Low-fat proteins include fish, skinless chicken, eggs, beans, and tofu. ? Avoiding fatty meat, cured and processed meat, or chicken with skin. ? Avoiding premade or processed food.  Eat less than 1,500 mg of salt (sodium) a day.  Limit alcohol use to no more than 1 drink a day for nonpregnant women and 2 drinks a day for men. One drink equals 12 oz of beer, 5 oz of wine, or 1 oz of hard liquor. Lifestyle  Work with your doctor to stay at a healthy weight or to lose weight. Ask your doctor what the  best weight is for you.  Get at least 30 minutes of exercise that causes your heart to beat faster (aerobic exercise) most days of the week. This may include walking, swimming, or biking.  Get at least 30 minutes of exercise that strengthens your muscles (resistance exercise) at least 3 days a week. This may include lifting weights or pilates.  Do not use any products that contain nicotine or tobacco. This includes cigarettes and e-cigarettes. If you need help quitting, ask your doctor.  Check your blood pressure at home as told by your doctor.  Keep all follow-up visits as told by your doctor. This is important. Medicines  Take over-the-counter and prescription medicines only as told by your doctor. Follow directions carefully.  Do not skip doses of blood pressure medicine. The medicine does not work as well if you skip doses. Skipping doses also puts you at risk for problems.  Ask your doctor about side effects or reactions to medicines that you should watch for. Contact a doctor if:  You think you are having a reaction to the medicine you are taking.  You have headaches that keep coming back (recurring).  You feel dizzy.  You have swelling in your ankles.  You have trouble with your vision. Get help right away if:  You get a very bad headache.  You start to feel confused.  You feel weak or numb.  You feel faint.  You get very bad pain in your: ? Chest. ?  Belly (abdomen).  You throw up (vomit) more than once.  You have trouble breathing. Summary  Hypertension is another name for high blood pressure.  Making healthy choices can help lower blood pressure. If your blood pressure cannot be controlled with healthy choices, you may need to take medicine. This information is not intended to replace advice given to you by your health care provider. Make sure you discuss any questions you have with your health care provider. Document Released: 08/24/2007 Document Revised:  02/03/2016 Document Reviewed: 02/03/2016 Elsevier Interactive Patient Education  [employer].

## 2017-07-06 NOTE — Progress Notes (Signed)
Name: [name]   MRN: [id number]    DOB: 1965-08-07   Date:07/06/2017       Progress Note  Subjective  Chief Complaint  Chief Complaint  Patient presents with  . Follow-up    blood pressure     HPI  Hypertension -follow up today- maintained on carvedilol 12.5 daily and her Amlodipine dosage was increased to 10 at last ov due to increased BP readings She says she has increased the amlodipine dosage to 10 and has continued the carvedilol, taking both medications daily Her blood pressure remains high today, she states she is just so stressed and that is making her BP high. She feels that no matter what medication she takes, her BP will still be high due to her stress level. She is not willing to adjust her BP medications today, but says she is just here for a refill of the amlodipine and carvedilol as she does intend to continue these both daily Reports she does not check her BP at home Denies headaches, vision changes, chest pain, shortness of breath, edema.  BP Readings from Last 3 Encounters:  07/06/17 (!) 162/104  05/25/17 (!) 172/104  05/18/17 (!) 177/103   She is also requesting a refill of her topamax. She says she is taking this medication for her mood and to help her sleep. She tells me that she stopped taking both her BuSpar and keppra because they were "making her itch." since our last visit, she did schedule an appointment with neurology, upcoming in May, and tells me today that she would be willing to go back to psychiatry to help with her stress and mood.  She denies syncope, falls, tremor, thoughts of hurting herself or others, rash.  Patient Active Problem List   Diagnosis Date Noted  . Noncompliance 04/27/2017  . Vasovagal syncope 03/29/2017  . LGSIL on Pap smear of cervix 04/26/2016  . Herpes simplex vulvovaginitis 03/01/2016  . Lower extremity edema 07/20/2015  . Chronic kidney disease, stage IV (severe) ([place]) 05/14/2015  . Proteinuria 05/14/2015  .  Generalized headaches 01/05/2015  . Weight gain following gastric bypass surgery 08/13/2014  . Deficiency anemia 01/06/2014  . Iron deficiency anemia 01/06/2014  . Vitamin B12 deficiency (dietary) anemia 01/06/2014  . Essential hypertension, benign 01/25/2013  . Bipolar disorder [facility])     Past Surgical History:  Procedure Laterality Date  . CARPAL TUNNEL RELEASE Right 07/08/2014   Procedure: RIGHT CARPAL TUNNEL RELEASE;  Surgeon: Sanjuana Kava, MD;  Location: [facility];  Service: Orthopedics;  Laterality: Right;  . CHOLECYSTECTOMY    . GASTRIC BYPASS  2000  . HYSTEROSCOPY W/D&C  11/14/2011   Procedure: DILATATION AND CURETTAGE /HYSTEROSCOPY;  Surgeon: Marylynn Pearson, MD;  Location: [facility];  Service: Gynecology;;  with removal of polyps  . INTRAUTERINE DEVICE (IUD) INSERTION  03/21/2010  . LAPAROSCOPIC GASTROTOMY W/ REPAIR OF ULCER    . PANNICULECTOMY    . ROTATOR CUFF REPAIR      Family History  Problem Relation Age of Onset  . Hypertension Mother   . Kidney failure Mother   . Hypertension Father   . Diabetes Cousin   . Diabetes Sister   . Hypertension Sister   . Kidney disease Sister        dialysis  . Hypertension Brother   . Heart attack Brother   . Hypertension Maternal Aunt   . Hypertension Maternal Uncle   . Hypertension Maternal Grandmother   . Hypertension Maternal Grandfather   . Hypertension  Paternal Grandmother   . Hypertension Paternal Grandfather   . Heart attack Brother   . Hypertension Brother   . Hypertension Sister     Social History   Socioeconomic History  . Marital status: Single    Spouse name: Not on file  . Number of children: 0  . Years of education: 47  . Highest education level: Not on file  Occupational History  . Occupation: Retired  Scientific laboratory technician  . Financial resource strain: Not on file  . Food insecurity:    Worry: Not on file    Inability: Not on file  . Transportation needs:    Medical: Not on file    Non-medical: Not on file   Tobacco Use  . Smoking status: Former Smoker    Packs/day: 0.25    Years: 4.00    Pack years: 1.00    Types: Cigarettes    Last attempt to quit: 03/21/1988    Years since quitting: 29.3  . Smokeless tobacco: Never Used  Substance and Sexual Activity  . Alcohol use: Yes    Comment: 1-2 times a year.   . Drug use: No  . Sexual activity: Never  Lifestyle  . Physical activity:    Days per week: Not on file    Minutes per session: Not on file  . Stress: Not on file  Relationships  . Social connections:    Talks on phone: Not on file    Gets together: Not on file    Attends religious service: Not on file    Active member of club or organization: Not on file    Attends meetings of clubs or organizations: Not on file    Relationship status: Not on file  . Intimate partner violence:    Fear of current or ex partner: Not on file    Emotionally abused: Not on file    Physically abused: Not on file    Forced sexual activity: Not on file  Other Topics Concern  . Not on file  Social History Narrative   Fun: Puzzles, watch mystery shows, read   Denies religious beliefs effecting health care.      Current Outpatient Medications:  .  acetaminophen (TYLENOL 8 HOUR) 650 MG CR tablet, Take 650 mg by mouth every 8 (eight) hours as needed for pain., Disp: , Rfl:  .  amLODipine (NORVASC) 10 MG tablet, Take 0.5 tablets (5 mg total) by mouth daily., Disp: 30 tablet, Rfl: 1 .  busPIRone (BUSPAR) 10 MG tablet, Take 1 tablet (10 mg total) by mouth 2 (two) times daily., Disp: 60 tablet, Rfl: 1 .  carvedilol (COREG) 12.5 MG tablet, TAKE (1) TABLET BY MOUTH TWICE DAILY WITH A MEAL., Disp: 180 tablet, Rfl: 0 .  diphenhydrAMINE (BENADRYL ALLERGY) 25 MG tablet, Take 25 mg by mouth as needed., Disp: , Rfl:  .  levETIRAcetam (KEPPRA) 500 MG tablet, Take 1 tablet (500 mg total) by mouth 2 (two) times daily., Disp: 60 tablet, Rfl: 1 .  promethazine (PHENERGAN) 25 MG tablet, Take 1 tablet (25 mg total) every  6 (six) hours as needed by mouth for nausea or vomiting., Disp: 30 tablet, Rfl: 0 .  tiZANidine (ZANAFLEX) 4 MG tablet, One tablet by mouth every 12 hours as needed for spasm., Disp: 60 tablet, Rfl: 3 .  topiramate (TOPAMAX) 100 MG tablet, TAKE 2 AND 1/2 TABLETS(250 MG) BY MOUTH TWICE DAILY, Disp: 150 tablet, Rfl: 0 .  traMADol (ULTRAM) 50 MG tablet, TAKE 1 TABLET(50 MG)  BY MOUTH EVERY 6 HOURS AS NEEDED, Disp: 90 tablet, Rfl: 1 .  traMADol (ULTRAM) 50 MG tablet, TAKE 1 TABLET(50 MG) BY MOUTH EVERY 6 HOURS AS NEEDED, Disp: 90 tablet, Rfl: 3 .  valACYclovir (VALTREX) 1000 MG tablet, Take 0.5 tablets (500 mg total) by mouth daily., Disp: 30 tablet, Rfl: 5 .  venlafaxine XR (EFFEXOR-XR) 150 MG 24 hr capsule, Take 1 capsule (150 mg total) by mouth 2 (two) times daily., Disp: 180 capsule, Rfl: 0  Allergies  Allergen Reactions  . Ambien [Zolpidem Tartrate]     "BLACK OUT", SLEEP DRIVING  . Geodon [Ziprasidone Hcl]     Seizure activity  . Lactulose Itching  . Ondansetron Nausea And Vomiting  . Quetiapine     Other reaction(s): Other (See Comments) Possible Hair Loss  . Sulfa Antibiotics Rash     ROS See HPI  Objective  Vitals:   07/06/17 0942  BP: (!) 162/104  Pulse: (!) 104  Resp: 16  Temp: 98 F (36.7 C)  TempSrc: Oral  SpO2: 98%  Weight: 156 lb (70.8 kg)  Height: 4\' 11"  (1.499 m)    Body mass index is 31.51 kg/m.  Physical Exam Vital signs reviewed. Constitutional: Patient appears well-developed and well-nourished. No distress.  HENT: Head: Normocephalic and atraumatic. Nose: Nose normal. Mouth/Throat: Oropharynx is clear and moist.  Eyes: Conjunctivae and EOM are normal. Pupils are equal, round, and reactive to light. No scleral icterus.  Neck: Normal range of motion. Neck supple. Cardiovascular: Normal rate, regular rhythm and normal heart sounds. No murmur heard.Distal pulses intact. Pulmonary/Chest: Effort normal and breath sounds normal. No respiratory  distress. Neurological:She is alert and oriented to person, place, and time. Coordination, balance, strength, speech and gait are normal.  Skin: Skin is warm and dry. No rash noted. No erythema.  Psychiatric: Patient has a normal mood. Pt has a flataffect.  Assessment & Plan RTC in 3 months for F/U: HTN, after she follows up with psychiatry and neurology

## 2017-07-06 NOTE — Assessment & Plan Note (Signed)
She has stopped her keppra because she thinks it made her itch. She is not willing to re-start other medications today. She has not had any recent seizure activity and repeatedly tells me today that she does not believe she ever had a seizure Instructed to keep upcoming neurology appointment as planned She was also given repeat instructions not to drive until she is free of any seizure activity for 6 months or until she sees neurology - topiramate (TOPAMAX) 100 MG tablet; TAKE 2 AND 1/2 TABLETS(250 MG) BY MOUTH TWICE DAILY  Dispense: 150 tablet; Refill: 0

## 2017-07-06 NOTE — Assessment & Plan Note (Signed)
We discussed the risks of long term uncontrolled hypertension including heart attack., stroke, kidney failure and death. She adamantly refuses to adjust her blood pressure medications today. She says she is just here today to make sure I will refill her carvedilol, amlodipine, and topamax She will continue her carvedilol and amlodipine at this time since she is willing- we also discussed nonpharm management of HTN and return precautions and information was printed in AVS She is willing to see psychiatry and has actually scheduled an appointment for neurology in May, I have placed referral to psychiatry today-I continue to worry that her uncontrolled bipolar disorder clouds her judgement and I hope that psychiatry and neurology may be able to help stabilize her mood and mental status. At this point, I am trying to provide the best care that she will allow me to provide - amLODipine (NORVASC) 10 MG tablet; Take 1 tablet (10 mg total) by mouth daily.  Dispense: 30 tablet; Refill: 1 RTC in 3 months for F/U

## 2017-08-01 ENCOUNTER — Ambulatory Visit: Payer: Self-pay | Admitting: Neurology

## 2017-08-08 ENCOUNTER — Telehealth: Payer: Self-pay | Admitting: Nurse Practitioner

## 2017-08-08 NOTE — Telephone Encounter (Signed)
Received from [facility] forward 26 pages to Doctor [place].

## 2017-08-17 ENCOUNTER — Other Ambulatory Visit: Payer: Self-pay | Admitting: Nurse Practitioner

## 2017-08-17 ENCOUNTER — Ambulatory Visit: Payer: Self-pay | Admitting: Orthopaedic Surgery

## 2017-08-17 DIAGNOSIS — R569 Unspecified convulsions: Secondary | ICD-10-CM

## 2017-08-17 NOTE — Telephone Encounter (Signed)
Topamax refill Last Refill: 07/06/17 #150 Last OV: 07/06/17 PCP: [place]     [address]

## 2017-08-17 NOTE — Telephone Encounter (Signed)
Copied from [place] [id number]. Topic: Quick Communication - Rx Refill/Question >> Aug 17, 2017 10:51 AM Boyd Kerbs wrote: Medication:  topiramate (TOPAMAX) 100 MG tablet  Has the patient contacted their pharmacy? No. (Agent: If no, request that the patient contact the pharmacy for the refill.) (Agent: If yes, when and what did the pharmacy advise?)  Preferred Pharmacy (with phone number or street name):   [facility] [phone] - [address], [address] AT [address] [place] [address] Phone: [phone] Fax: [phone]    Agent: Please be advised that RX refills may take up to 3 business days. We ask that you follow-up with your pharmacy.

## 2017-08-18 ENCOUNTER — Other Ambulatory Visit: Payer: Self-pay

## 2017-08-18 DIAGNOSIS — R569 Unspecified convulsions: Secondary | ICD-10-CM

## 2017-08-18 MED ORDER — TOPIRAMATE 100 MG PO TABS: ORAL_TABLET | ORAL | 0 refills | Status: DC

## 2017-08-22 ENCOUNTER — Ambulatory Visit (INDEPENDENT_AMBULATORY_CARE_PROVIDER_SITE_OTHER): Payer: Medicare Other | Admitting: Orthopaedic Surgery

## 2017-08-22 ENCOUNTER — Encounter: Payer: Self-pay | Admitting: Orthopaedic Surgery

## 2017-08-22 VITALS — BP 185/115 | HR 85 | Temp 97.2°F | Ht 59.0 in | Wt 153.0 lb

## 2017-08-22 DIAGNOSIS — M25511 Pain in right shoulder: Secondary | ICD-10-CM | POA: Diagnosis not present

## 2017-08-22 DIAGNOSIS — I10 Essential (primary) hypertension: Secondary | ICD-10-CM

## 2017-08-22 DIAGNOSIS — G8929 Other chronic pain: Secondary | ICD-10-CM | POA: Diagnosis not present

## 2017-08-22 DIAGNOSIS — M25512 Pain in left shoulder: Secondary | ICD-10-CM

## 2017-08-22 NOTE — Progress Notes (Signed)
Patient [name], female DOB:03/04/1966, 52 y.o. YIF:[id number]  Chief Complaint  Patient presents with  . Shoulder Pain    bilateral     HPI  [name] is a 52 y.o. female who has bilateral shoulder pain. She has more pain in the left shoulder.  She has chronic pain.  She has no new trauma.  She is in the process of moving to [facility] She will need to find a new doctor there.  She will need to have her records sent there.  She is still taking her pain medicine and doing well with it.  I spent about 20 minutes with her going over her condition, need to continue her medicine and other such instructions.  She plans to go to college and get further education.  I told her to be careful in prolonged use of mouse at computer so it would not bother her shoulders. HPI  Body mass index is 30.9 kg/m.  ROS  Review of Systems  HENT: Negative for congestion.   Respiratory: Negative for cough and shortness of breath.   Cardiovascular: Negative for chest pain and leg swelling.  Endocrine: Positive for cold intolerance.  Musculoskeletal: Positive for arthralgias.  Allergic/Immunologic: Positive for environmental allergies.  All other systems reviewed and are negative.   Past Medical History:  Diagnosis Date  . Anemia   . Anxiety   . Bipolar disorder ([place])   . Depression   . History of blood transfusion   . Hypertension   . IUD    HISTORY OF IUD --REMOVED IN 2006  . Seizures ([place]) [id number]   two seizures due to a med changes    Past Surgical History:  Procedure Laterality Date  . CARPAL TUNNEL RELEASE Right 07/08/2014   Procedure: RIGHT CARPAL TUNNEL RELEASE;  Surgeon: Sanjuana Kava, MD;  Location: [facility];  Service: Orthopedics;  Laterality: Right;  . CHOLECYSTECTOMY    . GASTRIC BYPASS  2000  . HYSTEROSCOPY W/D&C  11/14/2011   Procedure: DILATATION AND CURETTAGE /HYSTEROSCOPY;  Surgeon: Marylynn Pearson, MD;  Location: [facility];  Service: Gynecology;;  with  removal of polyps  . INTRAUTERINE DEVICE (IUD) INSERTION  03/21/2010  . LAPAROSCOPIC GASTROTOMY W/ REPAIR OF ULCER    . PANNICULECTOMY    . ROTATOR CUFF REPAIR      Family History  Problem Relation Age of Onset  . Hypertension Mother   . Kidney failure Mother   . Hypertension Father   . Diabetes Cousin   . Diabetes Sister   . Hypertension Sister   . Kidney disease Sister        dialysis  . Hypertension Brother   . Heart attack Brother   . Hypertension Maternal Aunt   . Hypertension Maternal Uncle   . Hypertension Maternal Grandmother   . Hypertension Maternal Grandfather   . Hypertension Paternal Grandmother   . Hypertension Paternal Grandfather   . Heart attack Brother   . Hypertension Brother   . Hypertension Sister     Social History Social History   Tobacco Use  . Smoking status: Former Smoker    Packs/day: 0.25    Years: 4.00    Pack years: 1.00    Types: Cigarettes    Last attempt to quit: 03/21/1988    Years since quitting: 29.4  . Smokeless tobacco: Never Used  Substance Use Topics  . Alcohol use: Yes    Comment: 1-2 times a year.   . Drug use: No    Allergies  Allergen Reactions  . Ambien [Zolpidem Tartrate]     "BLACK OUT", SLEEP DRIVING  . Geodon [Ziprasidone Hcl]     Seizure activity  . Lactulose Itching  . Ondansetron Nausea And Vomiting  . Quetiapine     Other reaction(s): Other (See Comments) Possible Hair Loss  . Sulfa Antibiotics Rash    Current Outpatient Medications  Medication Sig Dispense Refill  . acetaminophen (TYLENOL 8 HOUR) 650 MG CR tablet Take 650 mg by mouth every 8 (eight) hours as needed for pain.    Marland Kitchen amLODipine (NORVASC) 10 MG tablet Take 1 tablet (10 mg total) by mouth daily. 30 tablet 1  . busPIRone (BUSPAR) 10 MG tablet Take 1 tablet (10 mg total) by mouth 2 (two) times daily. 60 tablet 1  . carvedilol (COREG) 12.5 MG tablet TAKE (1) TABLET BY MOUTH TWICE DAILY WITH A MEAL. 180 tablet 0  . diphenhydrAMINE (BENADRYL  ALLERGY) 25 MG tablet Take 25 mg by mouth as needed.    . levETIRAcetam (KEPPRA) 500 MG tablet Take 1 tablet (500 mg total) by mouth 2 (two) times daily. 60 tablet 1  . promethazine (PHENERGAN) 25 MG tablet Take 1 tablet (25 mg total) every 6 (six) hours as needed by mouth for nausea or vomiting. 30 tablet 0  . tiZANidine (ZANAFLEX) 4 MG tablet One tablet by mouth every 12 hours as needed for spasm. 60 tablet 3  . topiramate (TOPAMAX) 100 MG tablet TAKE 2 AND 1/2 TABLETS(250 MG) BY MOUTH TWICE DAILY 150 tablet 0  . traMADol (ULTRAM) 50 MG tablet TAKE 1 TABLET(50 MG) BY MOUTH EVERY 6 HOURS AS NEEDED 90 tablet 1  . traMADol (ULTRAM) 50 MG tablet TAKE 1 TABLET(50 MG) BY MOUTH EVERY 6 HOURS AS NEEDED 90 tablet 3  . valACYclovir (VALTREX) 1000 MG tablet Take 0.5 tablets (500 mg total) by mouth daily. 30 tablet 5  . venlafaxine XR (EFFEXOR-XR) 150 MG 24 hr capsule Take 1 capsule (150 mg total) by mouth 2 (two) times daily. 180 capsule 0   No current facility-administered medications for this visit.      Physical Exam  Blood pressure (!) 185/115, pulse 85, temperature (!) 97.2 F (36.2 C), height 4\' 11"  (1.499 m), weight 153 lb (69.4 kg).  Constitutional: overall normal hygiene, normal nutrition, well developed, normal grooming, normal body habitus. Assistive device:none  Musculoskeletal: gait and station Limp none, muscle tone and strength are normal, no tremors or atrophy is present.  .  Neurological: coordination overall normal.  Deep tendon reflex/nerve stretch intact.  Sensation normal.  Cranial nerves II-XII intact.   Skin:   Normal overall no scars, lesions, ulcers or rashes. No psoriasis.  Psychiatric: Alert and oriented x 3.  Recent memory intact, remote memory unclear.  Normal mood and affect. Well groomed.  Good eye contact.  Cardiovascular: overall no swelling, no varicosities, no edema bilaterally, normal temperatures of the legs and arms, no clubbing, cyanosis and good capillary  refill.  Lymphatic: palpation is normal.  Both shoulder have pain.  She can get to full extension with marked pain.  She has normal NV status.  She has bilateral shoulder scars.  All other systems reviewed and are negative   The patient has been educated about the nature of the problem(s) and counseled on treatment options.  The patient appeared to understand what I have discussed and is in agreement with it.  Encounter Diagnoses  Name Primary?  . Chronic right shoulder pain Yes  . Chronic left shoulder  pain   . Essential hypertension, benign     PLAN Call if any problems.  Precautions discussed.  Continue current medications.   Return to clinic prn   Electronically Signed Sanjuana Kava, MD 6/4/201910:55 AM

## 2017-08-25 ENCOUNTER — Ambulatory Visit: Payer: Self-pay | Admitting: Nurse Practitioner

## 2017-08-25 DIAGNOSIS — Z0289 Encounter for other administrative examinations: Secondary | ICD-10-CM

## 2017-09-17 ENCOUNTER — Other Ambulatory Visit: Payer: Self-pay | Admitting: Orthopaedic Surgery

## 2017-09-18 ENCOUNTER — Other Ambulatory Visit: Payer: Self-pay

## 2017-09-18 DIAGNOSIS — F3160 Bipolar disorder, current episode mixed, unspecified: Secondary | ICD-10-CM

## 2017-09-18 MED ORDER — VENLAFAXINE HCL ER 150 MG PO CP24: 150.0000 mg | ORAL_CAPSULE | Freq: Two times a day (BID) | ORAL | 0 refills | Status: DC

## 2017-10-19 ENCOUNTER — Other Ambulatory Visit: Payer: Self-pay | Admitting: Orthopaedic Surgery

## 2017-10-19 ENCOUNTER — Other Ambulatory Visit: Payer: Self-pay | Admitting: Nurse Practitioner

## 2017-10-19 DIAGNOSIS — I10 Essential (primary) hypertension: Secondary | ICD-10-CM

## 2017-10-20 ENCOUNTER — Encounter: Payer: Self-pay | Admitting: Family

## 2017-10-20 ENCOUNTER — Ambulatory Visit (INDEPENDENT_AMBULATORY_CARE_PROVIDER_SITE_OTHER): Payer: Medicare Other | Admitting: Family

## 2017-10-20 VITALS — BP 160/118 | HR 84 | Temp 98.0°F | Ht 59.0 in | Wt 145.1 lb

## 2017-10-20 DIAGNOSIS — I10 Essential (primary) hypertension: Secondary | ICD-10-CM | POA: Diagnosis not present

## 2017-10-20 DIAGNOSIS — R4689 Other symptoms and signs involving appearance and behavior: Secondary | ICD-10-CM | POA: Diagnosis not present

## 2017-10-20 DIAGNOSIS — R55 Syncope and collapse: Secondary | ICD-10-CM

## 2017-10-20 MED ORDER — CARVEDILOL 12.5 MG PO TABS: ORAL_TABLET | ORAL | 0 refills | Status: DC

## 2017-10-20 NOTE — Progress Notes (Signed)
[name] is a 52 y.o. female with the following history as recorded in Epic[employer]:  Patient Active Problem List   Diagnosis Date Noted  . Seizure ([place]) 07/06/2017  . Noncompliance 04/27/2017  . Vasovagal syncope 03/29/2017  . LGSIL on Pap smear of cervix 04/26/2016  . Herpes simplex vulvovaginitis 03/01/2016  . Lower extremity edema 07/20/2015  . Chronic kidney disease, stage IV (severe) ([place]) 05/14/2015  . Proteinuria 05/14/2015  . Generalized headaches 01/05/2015  . Weight gain following gastric bypass surgery 08/13/2014  . Deficiency anemia 01/06/2014  . Iron deficiency anemia 01/06/2014  . Vitamin B12 deficiency (dietary) anemia 01/06/2014  . Essential hypertension, benign 01/25/2013  . Bipolar disorder ([place])     Current Outpatient Medications  Medication Sig Dispense Refill  . acetaminophen (TYLENOL 8 HOUR) 650 MG CR tablet Take 650 mg by mouth every 8 (eight) hours as needed for pain.    . carvedilol (COREG) 12.5 MG tablet TAKE (1) TABLET BY MOUTH TWICE DAILY WITH A MEAL. 180 tablet 0  . diphenhydrAMINE (BENADRYL ALLERGY) 25 MG tablet Take 25 mg by mouth as needed.    . levETIRAcetam (KEPPRA) 500 MG tablet Take 1 tablet (500 mg total) by mouth 2 (two) times daily. 60 tablet 1  . tiZANidine (ZANAFLEX) 4 MG tablet TAKE 1 TABLET BY MOUTH EVERY 12 HOURS AS NEEDED FOR SPASM 60 tablet 0  . topiramate (TOPAMAX) 100 MG tablet TAKE 2 AND 1/2 TABLETS(250 MG) BY MOUTH TWICE DAILY 150 tablet 0  . traMADol (ULTRAM) 50 MG tablet TAKE 1 TABLET(50 MG) BY MOUTH EVERY 6 HOURS AS NEEDED 90 tablet 1  . traMADol (ULTRAM) 50 MG tablet TAKE 1 TABLET(50 MG) BY MOUTH EVERY 6 HOURS AS NEEDED 90 tablet 2  . valACYclovir (VALTREX) 1000 MG tablet Take 0.5 tablets (500 mg total) by mouth daily. 30 tablet 5  . venlafaxine XR (EFFEXOR-XR) 150 MG 24 hr capsule Take 1 capsule (150 mg total) by mouth 2 (two) times daily. 180 capsule 0  . promethazine (PHENERGAN) 25 MG tablet Take 1 tablet (25 mg total)  every 6 (six) hours as needed by mouth for nausea or vomiting. (Patient not taking: Reported on 10/20/2017) 30 tablet 0   No current facility-administered medications for this visit.     Allergies: Ambien [zolpidem tartrate]; Geodon [ziprasidone hcl]; Lactulose; Ondansetron; Quetiapine; and Sulfa antibiotics  Past Medical History:  Diagnosis Date  . Anemia   . Anxiety   . Bipolar disorder ([place])   . Depression   . History of blood transfusion   . Hypertension   . IUD    HISTORY OF IUD --REMOVED IN 2006  . Seizures ([place]) [id number]   two seizures due to a med changes    Past Surgical History:  Procedure Laterality Date  . CARPAL TUNNEL RELEASE Right 07/08/2014   Procedure: RIGHT CARPAL TUNNEL RELEASE;  Surgeon: Sanjuana Kava, MD;  Location: [facility];  Service: Orthopedics;  Laterality: Right;  . CHOLECYSTECTOMY    . GASTRIC BYPASS  2000  . HYSTEROSCOPY W/D&C  11/14/2011   Procedure: DILATATION AND CURETTAGE /HYSTEROSCOPY;  Surgeon: Marylynn Pearson, MD;  Location: [facility];  Service: Gynecology;;  with removal of polyps  . INTRAUTERINE DEVICE (IUD) INSERTION  03/21/2010  . LAPAROSCOPIC GASTROTOMY W/ REPAIR OF ULCER    . PANNICULECTOMY    . ROTATOR CUFF REPAIR      Family History  Problem Relation Age of Onset  . Hypertension Mother   . Kidney failure Mother   .  Hypertension Father   . Diabetes Cousin   . Diabetes Sister   . Hypertension Sister   . Kidney disease Sister        dialysis  . Hypertension Brother   . Heart attack Brother   . Hypertension Maternal Aunt   . Hypertension Maternal Uncle   . Hypertension Maternal Grandmother   . Hypertension Maternal Grandfather   . Hypertension Paternal Grandmother   . Hypertension Paternal Grandfather   . Heart attack Brother   . Hypertension Brother   . Hypertension Sister     Social History   Tobacco Use  . Smoking status: Former Smoker    Packs/day: 0.25    Years: 4.00    Pack years: 1.00    Types: Cigarettes    Last attempt  to quit: 03/21/1988    Years since quitting: 29.6  . Smokeless tobacco: Never Used  Substance Use Topics  . Alcohol use: Yes    Comment: 1-2 times a year.     Subjective:  Patient is here for a headache which she notes she had earlier in the week/ now resolved. Patient is a very difficult historian-  difficult to determine why she scheduled the appointment with me today as opposed to seeing her PCP who is available for follow-up; she states she had a headache earlier this week because she has been out of her Topamax for the past few weeks. She tells me initially Topamax is managed by her psychiatrist and then changes that it is managed by her PCP. As a result of the headache and being out of the Topamax, she opted to re-start her Keppra. She notes the headache resolved once she took a dose of Keppra. She has been prescribed this medication due to concerns for seizure activity but told her PCP at last visit in April 2019 that she had stopped this medication due to itching. She has been referred to neurology at least 3 x in the past 6 months to follow-up on the seizures but has cancelled all of these appointments. Her PCP has indicated to her that she would not be managing the [place] for the long term. She has been instructed to stop driving but continues to drive because she is adamant that she is not having seizures. Her chart indicates that she was taken to the ER in November 2018 with witnessed seizure activity- not admitted as it was felt to be medication reaction due to combination of Topamax and benzodiazepene.   She is also adamant that her blood pressure is only elevated here and that stress is causing her blood pressure to be elevated. She refuses to take additional blood pressure medications today. In fact, she notes that she stopped her Amlodipine herself. She is only on her Coreg and needs a refill on this medication. There is a history of kidney disease and she states she is under the care of  nephrology in [place]. No labs have been updated here.    Objective:  Vitals:   10/20/17 1110  BP: (!) 160/118  Pulse: 84  Temp: 98 F (36.7 C)  TempSrc: Oral  SpO2: 97%  Weight: 145 lb 1.3 oz (65.8 kg)  Height: 4\' 11"  (1.499 m)    General: Well developed, well nourished, in no acute distress  Skin : Warm and dry.  Neurologic: Alert and oriented; speech intact; face symmetrical; moves all extremities well; CNII-XII intact without focal deficit   No exam is done as patient leaves the office before the visit  is completed  Assessment:  1. Syncope, unspecified syncope type   2. Essential hypertension, benign   3. Non-compliant behavior     Plan:  1. I stressed again that she does need to see a neurologist/ should not be driving with the question of seizures- she states she will not stop driving. I explained that our office will not be managing her Keppra for the long-term. She has been referred to neurology at least 3x in the past 6 months- I updated again. I also explained that I would not be writing Topamax for her as this was supposed to be managed by a psychiatrist and she had not established as discussed recently with her PCP; Topamax was written by her PCP for one time refill with the understanding that she would see a psychiatrist. 2. Patient is only taking Coreg- I did refill this for her; will not consider taking other medication; adamant that stress is the problem. 3. Patient is disrespectful in discussing her current PCP in the office today.  I explained that I would not be willing to take her over primary care- at that point, the patient, decided to end the appointment and left.   No follow-ups on file.  Orders Placed This Encounter  Procedures  . Ambulatory referral to Neurology    Referral Priority:   Routine    Referral Type:   Consultation    Referral Reason:   Specialty Services Required    Requested Specialty:   Neurology    Number of Visits Requested:   1     Requested Prescriptions   Signed Prescriptions Disp Refills  . carvedilol (COREG) 12.5 MG tablet 180 tablet 0    Sig: TAKE (1) TABLET BY MOUTH TWICE DAILY WITH A MEAL.

## 2017-10-23 ENCOUNTER — Other Ambulatory Visit: Payer: Self-pay

## 2017-10-23 ENCOUNTER — Encounter: Payer: Self-pay | Admitting: Neurology

## 2017-10-23 ENCOUNTER — Ambulatory Visit (INDEPENDENT_AMBULATORY_CARE_PROVIDER_SITE_OTHER): Payer: Medicare Other | Admitting: Neurology

## 2017-10-23 VITALS — BP 160/102 | HR 88 | Ht 59.0 in | Wt 151.0 lb

## 2017-10-23 DIAGNOSIS — R569 Unspecified convulsions: Secondary | ICD-10-CM

## 2017-10-23 DIAGNOSIS — R55 Syncope and collapse: Secondary | ICD-10-CM

## 2017-10-23 MED ORDER — LEVETIRACETAM 500 MG PO TABS: ORAL_TABLET | ORAL | 6 refills | Status: DC
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
## 2017-10-23 NOTE — Patient Instructions (Addendum)
1. Schedule open MRI brain with and without contrast  We have sent a referral for your OPEN MRI to [facility].  They will contact you directly to schedule your appointment.  [facility] is located at [address], [place], [place] [address].  If you need to speak with [facility] for any reason, they can be reached at [phone]  2. Schedule 1-hour EEG, then if normal, plan for a 24-hour EEG  3. Continue Keppra 500mg  every night 4. As per [place] driving laws, no driving after an episode of loss of consciousness from any reason, until 6 months event-free 5. Follow-up after tests, call for any changes

## 2017-10-24 ENCOUNTER — Telehealth: Payer: Self-pay | Admitting: Family

## 2017-10-24 NOTE — Telephone Encounter (Signed)
Patient dismissed from [facility] by Sherlene Shams FNP, effective October 20, 2017 . Dismissal letter sent out by certified / registered mail.  daj

## 2017-10-25 ENCOUNTER — Ambulatory Visit (INDEPENDENT_AMBULATORY_CARE_PROVIDER_SITE_OTHER): Payer: Medicare Other | Admitting: Neurology

## 2017-10-25 DIAGNOSIS — R569 Unspecified convulsions: Secondary | ICD-10-CM | POA: Diagnosis not present

## 2017-10-25 DIAGNOSIS — R55 Syncope and collapse: Secondary | ICD-10-CM | POA: Diagnosis not present

## 2017-10-26 ENCOUNTER — Ambulatory Visit (INDEPENDENT_AMBULATORY_CARE_PROVIDER_SITE_OTHER): Payer: Medicare Other | Admitting: Orthopaedic Surgery

## 2017-10-26 ENCOUNTER — Encounter: Payer: Self-pay | Admitting: Orthopaedic Surgery

## 2017-10-26 VITALS — BP 178/106 | HR 72 | Ht 59.0 in | Wt 150.0 lb

## 2017-10-26 DIAGNOSIS — G5601 Carpal tunnel syndrome, right upper limb: Secondary | ICD-10-CM | POA: Diagnosis not present

## 2017-10-26 MED ORDER — PREDNISONE 5 MG (21) PO TBPK: ORAL_TABLET | ORAL | 0 refills | Status: DC

## 2017-10-26 NOTE — Progress Notes (Signed)
Patient [name], female DOB:11-24-1965, 52 y.o. PXT:[id number]  Chief Complaint  Patient presents with  . Follow-up    Right hand pain     HPI  [name] is a 52 y.o. female who has developed pain and numbness again in the right hand.  It started about two weeks ago.  She is using a computer mouse a lot now.  She has no trauma.  She is post carpal tunnel surgery in the past.  She has night pain.  She is concerned her carpal tunnel has recurred.  I told her this is unusual but possible.  I will have her resume her splints she has at home.  I will give prednisone dose pack.   Body mass index is 30.3 kg/m.  ROS  Review of Systems  HENT: Negative for congestion.   Respiratory: Negative for cough and shortness of breath.   Cardiovascular: Negative for chest pain and leg swelling.  Endocrine: Positive for cold intolerance.  Musculoskeletal: Positive for arthralgias.  Allergic/Immunologic: Positive for environmental allergies.  All other systems reviewed and are negative.   All other systems reviewed and are negative.  Past Medical History:  Diagnosis Date  . Anemia   . Anxiety   . Bipolar disorder ([place])   . Depression   . History of blood transfusion   . Hypertension   . IUD    HISTORY OF IUD --REMOVED IN 2006  . Seizures ([place]) [id number]   two seizures due to a med changes    Past Surgical History:  Procedure Laterality Date  . CARPAL TUNNEL RELEASE Right 07/08/2014   Procedure: RIGHT CARPAL TUNNEL RELEASE;  Surgeon: Sanjuana Kava, MD;  Location: [facility];  Service: Orthopedics;  Laterality: Right;  . CHOLECYSTECTOMY    . GASTRIC BYPASS  2000  . HYSTEROSCOPY W/D&C  11/14/2011   Procedure: DILATATION AND CURETTAGE /HYSTEROSCOPY;  Surgeon: Marylynn Pearson, MD;  Location: [facility];  Service: Gynecology;;  with removal of polyps  . INTRAUTERINE DEVICE (IUD) INSERTION  03/21/2010  . LAPAROSCOPIC GASTROTOMY W/ REPAIR OF ULCER    . PANNICULECTOMY    . ROTATOR  CUFF REPAIR      Family History  Problem Relation Age of Onset  . Hypertension Mother   . Kidney failure Mother   . Hypertension Father   . Diabetes Cousin   . Diabetes Sister   . Hypertension Sister   . Kidney disease Sister        dialysis  . Hypertension Brother   . Heart attack Brother   . Hypertension Maternal Aunt   . Hypertension Maternal Uncle   . Hypertension Maternal Grandmother   . Hypertension Maternal Grandfather   . Hypertension Paternal Grandmother   . Hypertension Paternal Grandfather   . Heart attack Brother   . Hypertension Brother   . Hypertension Sister     Social History Social History   Tobacco Use  . Smoking status: Former Smoker    Packs/day: 0.25    Years: 4.00    Pack years: 1.00    Types: Cigarettes    Last attempt to quit: 03/21/1988    Years since quitting: 29.6  . Smokeless tobacco: Never Used  Substance Use Topics  . Alcohol use: Yes    Comment: 1-2 times a year.   . Drug use: No    Allergies  Allergen Reactions  . Ambien [Zolpidem Tartrate]     "BLACK OUT", SLEEP DRIVING  . Geodon [Ziprasidone Hcl]     Seizure  activity  . Lactulose Itching  . Ondansetron Nausea And Vomiting  . Quetiapine     Other reaction(s): Other (See Comments) Possible Hair Loss  . Sulfa Antibiotics Rash    Current Outpatient Medications  Medication Sig Dispense Refill  . acetaminophen (TYLENOL 8 HOUR) 650 MG CR tablet Take 650 mg by mouth every 8 (eight) hours as needed for pain.    . carvedilol (COREG) 12.5 MG tablet TAKE (1) TABLET BY MOUTH TWICE DAILY WITH A MEAL. 180 tablet 0  . diphenhydrAMINE (BENADRYL ALLERGY) 25 MG tablet Take 25 mg by mouth as needed.    . levETIRAcetam (KEPPRA) 500 MG tablet Take 1 tablet every night 30 tablet 6  . predniSONE (STERAPRED UNI-PAK 21 TAB) 5 MG (21) TBPK tablet Take 6 pills first day; 5 pills second day; 4 pills third day; 3 pills fourth day; 2 pills next day and 1 pill last day. 21 tablet 0  . promethazine  (PHENERGAN) 25 MG tablet Take 1 tablet (25 mg total) every 6 (six) hours as needed by mouth for nausea or vomiting. (Patient not taking: Reported on 10/20/2017) 30 tablet 0  . tiZANidine (ZANAFLEX) 4 MG tablet TAKE 1 TABLET BY MOUTH EVERY 12 HOURS AS NEEDED FOR SPASM 60 tablet 0  . traMADol (ULTRAM) 50 MG tablet TAKE 1 TABLET(50 MG) BY MOUTH EVERY 6 HOURS AS NEEDED 90 tablet 2  . valACYclovir (VALTREX) 1000 MG tablet Take 0.5 tablets (500 mg total) by mouth daily. 30 tablet 5  . venlafaxine XR (EFFEXOR-XR) 150 MG 24 hr capsule Take 1 capsule (150 mg total) by mouth 2 (two) times daily. 180 capsule 0   No current facility-administered medications for this visit.      Physical Exam  Blood pressure (!) 178/106, pulse 72, height 4\' 11"  (1.499 m), weight 150 lb (68 kg).  Constitutional: overall normal hygiene, normal nutrition, well developed, normal grooming, normal body habitus. Assistive device:none  Musculoskeletal: gait and station Limp none, muscle tone and strength are normal, no tremors or atrophy is present.  .  Neurological: coordination overall normal.  Deep tendon reflex/nerve stretch intact.  Sensation normal.  Cranial nerves II-XII intact.   Skin:   Normal overall no scars, lesions, ulcers or rashes. No psoriasis.  Psychiatric: Alert and oriented x 3.  Recent memory intact, remote memory unclear.  Normal mood and affect. Well groomed.  Good eye contact.  Cardiovascular: overall no swelling, no varicosities, no edema bilaterally, normal temperatures of the legs and arms, no clubbing, cyanosis and good capillary refill.  Lymphatic: palpation is normal.  Right hand with some decreased sensation in the median nerve.  She has well healed scar volar wrist.  ROM is full.  She has no swelling.  All other systems reviewed and are negative   The patient has been educated about the nature of the problem(s) and counseled on treatment options.  The patient appeared to understand what I  have discussed and is in agreement with it.  Encounter Diagnosis  Name Primary?  . Carpal tunnel syndrome on right Yes    PLAN Call if any problems.  Precautions discussed.  Begin Prednisone dose pack.  Return to clinic 2 weeks   Electronically Signed Sanjuana Kava, MD 8/8/201910:23 AM

## 2017-11-01 ENCOUNTER — Encounter: Payer: Self-pay | Admitting: Neurology

## 2017-11-05 NOTE — Procedures (Signed)
ELECTROENCEPHALOGRAM REPORT  Date of Study: 10/25/2017  Patient's Name: [name] MRN: [id number] Date of Birth: 06-20-1965  Referring Provider: Dr. Ellouise Newer  Clinical History: This is a 53 year old woman with recurrent episodes of loss of consciousness.  Medications: TOPAMAX 100 MG tablet  KEPPRA 500 MG tablet  TYLENOL 8 HOUR 650 MG CR tablet  COREG) 12.5 MG tablet  BENADRYL ALLERGY 25 MG tablet  ZANAFLEX 4 MG tablet  ULTRAM 50 MG tablet  VALTREX 1000 MG tablet  EFFEXOR-XR 150 MG 24 hr capsule  Technical Summary: A multichannel digital 1-hour EEG recording measured by the international 10-20 system with electrodes applied with paste and impedances below 5000 ohms performed in our laboratory with EKG monitoring in an awake and asleep patient.  Hyperventilation and photic stimulation were performed.  The digital EEG was referentially recorded, reformatted, and digitally filtered in a variety of bipolar and referential montages for optimal display.    Description: The patient is awake and asleep during the recording.  During maximal wakefulness, there is a symmetric, medium voltage 10 Hz posterior dominant rhythm that attenuates with eye opening.  The record is symmetric.  There is an excess amount of diffuse low voltage beta activity seen throughout the recording. During drowsiness and sleep, there is an increase in theta slowing of the background, with shifting asymmetry over the bilateral temporal regions, at times sharply contoured without clear epileptogenic potential. Vertex waves and symmetric sleep spindles were seen.  Hyperventilation and photic stimulation did not elicit any abnormalities.  There were no epileptiform discharges or electrographic seizures seen.    EKG lead was unremarkable.  Impression: This awake and asleep EEG is normal except for excess amount of diffuse low voltage beta activity.  Clinical Correlation: Diffuse low voltage beta activity is  commonly seen with sedating medications such as benzodiazepines.  In the absence of sedating medications, anxiety and hyperthyroidism may produce generalized beta activity.  The absence of epileptiform discharges does not exclude a clinical diagnosis of epilepsy.  If further clinical questions remain, prolonged EEG may be helpful.  Clinical correlation is advised.   Ellouise Newer, M.D.

## 2017-11-06 NOTE — Telephone Encounter (Signed)
Received signed domestic return receipt verifying delivery of certified letter on November 02, 2017. Article number [id number]

## 2017-11-09 ENCOUNTER — Ambulatory Visit: Payer: Medicare Other | Admitting: Orthopaedic Surgery

## 2017-11-29 ENCOUNTER — Encounter: Payer: Self-pay | Admitting: Orthopaedic Surgery

## 2017-11-29 ENCOUNTER — Encounter: Payer: Self-pay | Admitting: Neurology

## 2017-11-29 ENCOUNTER — Ambulatory Visit (INDEPENDENT_AMBULATORY_CARE_PROVIDER_SITE_OTHER): Payer: Medicare Other | Admitting: Orthopaedic Surgery

## 2017-11-29 VITALS — BP 152/90 | HR 83 | Ht 59.0 in | Wt 159.0 lb

## 2017-11-29 DIAGNOSIS — G5601 Carpal tunnel syndrome, right upper limb: Secondary | ICD-10-CM | POA: Diagnosis not present

## 2017-11-29 DIAGNOSIS — R930 Abnormal findings on diagnostic imaging of skull and head, not elsewhere classified: Secondary | ICD-10-CM | POA: Diagnosis not present

## 2017-11-29 MED ORDER — TRAMADOL HCL 50 MG PO TABS: ORAL_TABLET | ORAL | 4 refills | Status: DC

## 2017-11-29 MED ORDER — TIZANIDINE HCL 4 MG PO TABS: ORAL_TABLET | ORAL | 4 refills | Status: DC

## 2017-11-29 NOTE — Progress Notes (Signed)
Patient [name]:[name], female DOB:[birth year], 52 y.o. LFY:[id number]  Chief Complaint  Patient presents with  . Hand Pain    HPI  [name] is a 52 y.o. female who has bilateral hand pain.  She is post carpal tunnel release in the past. I gave her prednisone which helped the numbness but she still has tenderness of the hands in the palm.  She has no redness.  I have advised using paraffin baths.  She will do this.   Body mass index is 32.11 kg/m.  ROS  Review of Systems  HENT: Negative for congestion.   Respiratory: Negative for cough and shortness of breath.   Cardiovascular: Negative for chest pain and leg swelling.  Endocrine: Positive for cold intolerance.  Musculoskeletal: Positive for arthralgias.  Allergic/Immunologic: Positive for environmental allergies.  All other systems reviewed and are negative.   All other systems reviewed and are negative.  The following is a summary of the past history medically, past history surgically, known current medicines, social history and family history.  This information is gathered electronically by the computer from prior information and documentation.  I review this each visit and have found including this information at this point in the chart is beneficial and informative.    Past Medical History:  Diagnosis Date  . Anemia   . Anxiety   . Bipolar disorder ([place])   . Depression   . History of blood transfusion   . Hypertension   . IUD    HISTORY OF IUD --REMOVED IN 2006  . Seizures ([place]) [id number]   two seizures due to a med changes    Past Surgical History:  Procedure Laterality Date  . CARPAL TUNNEL RELEASE Right 07/08/2014   Procedure: RIGHT CARPAL TUNNEL RELEASE;  Surgeon: Sanjuana Kava, MD;  Location: [facility];  Service: Orthopedics;  Laterality: Right;  . CHOLECYSTECTOMY    . GASTRIC BYPASS  2000  . HYSTEROSCOPY W/D&C  11/14/2011   Procedure: DILATATION AND CURETTAGE /HYSTEROSCOPY;  Surgeon: Marylynn Pearson, MD;  Location: [facility];  Service: Gynecology;;  with removal of polyps  . INTRAUTERINE DEVICE (IUD) INSERTION  03/21/2010  . LAPAROSCOPIC GASTROTOMY W/ REPAIR OF ULCER    . PANNICULECTOMY    . ROTATOR CUFF REPAIR      Family History  Problem Relation Age of Onset  . Hypertension Mother   . Kidney failure Mother   . Hypertension Father   . Diabetes Cousin   . Diabetes Sister   . Hypertension Sister   . Kidney disease Sister        dialysis  . Hypertension Brother   . Heart attack Brother   . Hypertension Maternal Aunt   . Hypertension Maternal Uncle   . Hypertension Maternal Grandmother   . Hypertension Maternal Grandfather   . Hypertension Paternal Grandmother   . Hypertension Paternal Grandfather   . Heart attack Brother   . Hypertension Brother   . Hypertension Sister     Social History Social History   Tobacco Use  . Smoking status: Former Smoker    Packs/day: 0.25    Years: 4.00    Pack years: 1.00    Types: Cigarettes    Last attempt to quit: 03/21/1988    Years since quitting: 29.7  . Smokeless tobacco: Never Used  Substance Use Topics  . Alcohol use: Yes    Comment: 1-2 times a year.   . Drug use: No    Allergies  Allergen Reactions  . Ambien [  Zolpidem Tartrate]     "BLACK OUT", SLEEP DRIVING  . Geodon [Ziprasidone Hcl]     Seizure activity  . Lactulose Itching  . Ondansetron Nausea And Vomiting  . Quetiapine     Other reaction(s): Other (See Comments) Possible Hair Loss  . Sulfa Antibiotics Rash    Current Outpatient Medications  Medication Sig Dispense Refill  . acetaminophen (TYLENOL 8 HOUR) 650 MG CR tablet Take 650 mg by mouth every 8 (eight) hours as needed for pain.    . carvedilol (COREG) 12.5 MG tablet TAKE (1) TABLET BY MOUTH TWICE DAILY WITH A MEAL. 180 tablet 0  . diphenhydrAMINE (BENADRYL ALLERGY) 25 MG tablet Take 25 mg by mouth as needed.    . levETIRAcetam (KEPPRA) 500 MG tablet Take 1 tablet every night 30 tablet 6  .  predniSONE (STERAPRED UNI-PAK 21 TAB) 5 MG (21) TBPK tablet Take 6 pills first day; 5 pills second day; 4 pills third day; 3 pills fourth day; 2 pills next day and 1 pill last day. 21 tablet 0  . promethazine (PHENERGAN) 25 MG tablet Take 1 tablet (25 mg total) every 6 (six) hours as needed by mouth for nausea or vomiting. (Patient not taking: Reported on 10/20/2017) 30 tablet 0  . tiZANidine (ZANAFLEX) 4 MG tablet Take one every 12 hours as needed for spasm. 60 tablet 4  . traMADol (ULTRAM) 50 MG tablet Take one tablet by mouth every six hours as needed. 90 tablet 4  . valACYclovir (VALTREX) 1000 MG tablet Take 0.5 tablets (500 mg total) by mouth daily. 30 tablet 5  . venlafaxine XR (EFFEXOR-XR) 150 MG 24 hr capsule Take 1 capsule (150 mg total) by mouth 2 (two) times daily. 180 capsule 0   No current facility-administered medications for this visit.      Physical Exam  Blood pressure (!) 152/90, pulse 83, height 4\' 11"  (1.499 m), weight 159 lb (72.1 kg).  Constitutional: overall normal hygiene, normal nutrition, well developed, normal grooming, normal body habitus. Assistive device:none  Musculoskeletal: gait and station Limp none, muscle tone and strength are normal, no tremors or atrophy is present.  .  Neurological: coordination overall normal.  Deep tendon reflex/nerve stretch intact.  Sensation normal.  Cranial nerves II-XII intact.   Skin:   Normal overall no scars, lesions, ulcers or rashes. No psoriasis.  Psychiatric: Alert and oriented x 3.  Recent memory intact, remote memory unclear.  Normal mood and affect. Well groomed.  Good eye contact.  Cardiovascular: overall no swelling, no varicosities, no edema bilaterally, normal temperatures of the legs and arms, no clubbing, cyanosis and good capillary refill.  Lymphatic: palpation is normal.  Her hands have no numbness today. ROM of the fingers is normal.  NV intact.  All other systems reviewed and are negative   The patient  has been educated about the nature of the problem(s) and counseled on treatment options.  The patient appeared to understand what I have discussed and is in agreement with it.  Encounter Diagnosis  Name Primary?  . Carpal tunnel syndrome on right Yes    PLAN Call if any problems.  Precautions discussed.  Continue current medications.   Return to clinic 6 weeks   I have reviewed the [facility] web site prior to prescribing narcotic medicine for this patient.    Electronically Signed Sanjuana Kava, MD 9/11/201911:10 AM

## 2017-11-30 ENCOUNTER — Ambulatory Visit: Payer: Medicare Other

## 2017-12-01 ENCOUNTER — Ambulatory Visit (INDEPENDENT_AMBULATORY_CARE_PROVIDER_SITE_OTHER): Payer: Medicare Other | Admitting: Obstetrics

## 2017-12-01 ENCOUNTER — Other Ambulatory Visit (HOSPITAL_COMMUNITY)
Admission: RE | Admit: 2017-12-01 | Discharge: 2017-12-01 | Disposition: A | Discharge status: Home / Self Care | Payer: Medicare Other | Source: Ambulatory Visit | Attending: Obstetrics | Admitting: Obstetrics

## 2017-12-01 ENCOUNTER — Encounter: Payer: Self-pay | Admitting: Obstetrics

## 2017-12-01 VITALS — BP 156/104 | HR 90 | Ht 59.0 in | Wt 159.0 lb

## 2017-12-01 DIAGNOSIS — Z01419 Encounter for gynecological examination (general) (routine) without abnormal findings: Secondary | ICD-10-CM | POA: Insufficient documentation

## 2017-12-01 DIAGNOSIS — I129 Hypertensive chronic kidney disease with stage 1 through stage 4 chronic kidney disease, or unspecified chronic kidney disease: Secondary | ICD-10-CM | POA: Insufficient documentation

## 2017-12-01 DIAGNOSIS — N898 Other specified noninflammatory disorders of vagina: Secondary | ICD-10-CM

## 2017-12-01 DIAGNOSIS — Z124 Encounter for screening for malignant neoplasm of cervix: Secondary | ICD-10-CM | POA: Diagnosis not present

## 2017-12-01 DIAGNOSIS — N184 Chronic kidney disease, stage 4 (severe): Secondary | ICD-10-CM | POA: Insufficient documentation

## 2017-12-01 DIAGNOSIS — Z87891 Personal history of nicotine dependence: Secondary | ICD-10-CM | POA: Diagnosis not present

## 2017-12-01 DIAGNOSIS — F319 Bipolar disorder, unspecified: Secondary | ICD-10-CM | POA: Insufficient documentation

## 2017-12-01 DIAGNOSIS — Z79899 Other long term (current) drug therapy: Secondary | ICD-10-CM | POA: Diagnosis not present

## 2017-12-01 DIAGNOSIS — Z113 Encounter for screening for infections with a predominantly sexual mode of transmission: Secondary | ICD-10-CM | POA: Diagnosis not present

## 2017-12-01 DIAGNOSIS — F419 Anxiety disorder, unspecified: Secondary | ICD-10-CM | POA: Diagnosis not present

## 2017-12-01 DIAGNOSIS — A6004 Herpesviral vulvovaginitis: Secondary | ICD-10-CM | POA: Insufficient documentation

## 2017-12-01 DIAGNOSIS — D649 Anemia, unspecified: Secondary | ICD-10-CM | POA: Diagnosis not present

## 2017-12-01 DIAGNOSIS — R350 Frequency of micturition: Secondary | ICD-10-CM

## 2017-12-01 DIAGNOSIS — Z1239 Encounter for other screening for malignant neoplasm of breast: Secondary | ICD-10-CM

## 2017-12-01 DIAGNOSIS — I10 Essential (primary) hypertension: Secondary | ICD-10-CM

## 2017-12-01 LAB — POCT URINALYSIS DIPSTICK
Bilirubin, UA: NEGATIVE
Clarity, UA: NEGATIVE
Glucose, UA: NEGATIVE
Ketones, UA: NEGATIVE
Leukocytes, UA: NEGATIVE
Nitrite, UA: NEGATIVE
Odor: NEGATIVE
Protein, UA: POSITIVE — AB
Spec Grav, UA: 1.01 (ref 1.010–1.025)
Urobilinogen, UA: 0.2 E.U./dL
pH, UA: 6 (ref 5.0–8.0)

## 2017-12-01 MED ORDER — VALACYCLOVIR HCL 1 G PO TABS: 500.0000 mg | ORAL_TABLET | Freq: Every day | ORAL | 5 refills | Status: DC

## 2017-12-01 NOTE — Progress Notes (Signed)
Pt presents annual, pap, and all STD testing. Pt c/o vaginal irritation and urinary frequency.

## 2017-12-01 NOTE — Progress Notes (Signed)
Subjective:        [name] is a 52 y.o. female here for a routine exam.  Current complaints: URINARY FREQUENCY.    Personal health questionnaire:  Is patient [name], have a family history of breast and/or ovarian cancer: no Is there a family history of uterine cancer diagnosed at age < 54, gastrointestinal cancer, urinary tract cancer, family member who is a Field seismologist syndrome-associated carrier: no Is the patient overweight and hypertensive, family history of diabetes, personal history of gestational diabetes, preeclampsia or PCOS: no Is patient over 49, have PCOS,  family history of premature CHD under age 58, diabetes, smoke, have hypertension or peripheral artery disease:  no At any time, has a partner hit, kicked or otherwise hurt or frightened you?: no Over the past 2 weeks, have you felt down, depressed or hopeless?: no Over the past 2 weeks, have you felt little interest or pleasure in doing things?:no   Gynecologic History No LMP recorded. (Menstrual status: IUD). Contraception: IUD Last Pap: 2018. Results were: normal Last mammogram: 2015. Results were: normal  Obstetric History OB History  Gravida Para Term Preterm AB Living  1       1 0  SAB TAB Ectopic Multiple Live Births  1            # Outcome Date GA Lbr Len/2nd Weight Sex Delivery Anes PTL Lv  1 SAB             Past Medical History:  Diagnosis Date  . Anemia   . Anxiety   . Bipolar disorder ([place])   . Depression   . History of blood transfusion   . Hypertension   . IUD    HISTORY OF IUD --REMOVED IN 2006  . Seizures ([place]) [id number]   two seizures due to a med changes    Past Surgical History:  Procedure Laterality Date  . CARPAL TUNNEL RELEASE Right 07/08/2014   Procedure: RIGHT CARPAL TUNNEL RELEASE;  Surgeon: Sanjuana Kava, MD;  Location: [facility];  Service: Orthopedics;  Laterality: Right;  . CHOLECYSTECTOMY    . GASTRIC BYPASS  2000  . HYSTEROSCOPY W/D&C  11/14/2011   Procedure: DILATATION AND CURETTAGE /HYSTEROSCOPY;  Surgeon: Marylynn Pearson, MD;  Location: [facility];  Service: Gynecology;;  with removal of polyps  . INTRAUTERINE DEVICE (IUD) INSERTION  03/21/2010  . LAPAROSCOPIC GASTROTOMY W/ REPAIR OF ULCER    . PANNICULECTOMY    . ROTATOR CUFF REPAIR       Current Outpatient Medications:  .  carvedilol (COREG) 12.5 MG tablet, TAKE (1) TABLET BY MOUTH TWICE DAILY WITH A MEAL., Disp: 180 tablet, Rfl: 0 .  diphenhydrAMINE (BENADRYL ALLERGY) 25 MG tablet, Take 25 mg by mouth as needed., Disp: , Rfl:  .  levETIRAcetam (KEPPRA) 500 MG tablet, Take 1 tablet every night, Disp: 30 tablet, Rfl: 6 .  tiZANidine (ZANAFLEX) 4 MG tablet, Take one every 12 hours as needed for spasm., Disp: 60 tablet, Rfl: 4 .  traMADol (ULTRAM) 50 MG tablet, Take one tablet by mouth every six hours as needed., Disp: 90 tablet, Rfl: 4 .  valACYclovir (VALTREX) 1000 MG tablet, Take 0.5 tablets (500 mg total) by mouth daily., Disp: 30 tablet, Rfl: 5 .  venlafaxine XR (EFFEXOR-XR) 150 MG 24 hr capsule, Take 1 capsule (150 mg total) by mouth 2 (two) times daily., Disp: 180 capsule, Rfl: 0 .  acetaminophen (TYLENOL 8 HOUR) 650 MG CR tablet, Take 650 mg by mouth every 8 (  eight) hours as needed for pain., Disp: , Rfl:  .  predniSONE (STERAPRED UNI-PAK 21 TAB) 5 MG (21) TBPK tablet, Take 6 pills first day; 5 pills second day; 4 pills third day; 3 pills fourth day; 2 pills next day and 1 pill last day. (Patient not taking: Reported on 12/01/2017), Disp: 21 tablet, Rfl: 0 .  promethazine (PHENERGAN) 25 MG tablet, Take 1 tablet (25 mg total) every 6 (six) hours as needed by mouth for nausea or vomiting. (Patient not taking: Reported on 10/20/2017), Disp: 30 tablet, Rfl: 0 Allergies  Allergen Reactions  . Ambien [Zolpidem Tartrate]     "BLACK OUT", SLEEP DRIVING  . Geodon [Ziprasidone Hcl]     Seizure activity  . Lactulose Itching  . Ondansetron Nausea And Vomiting  . Quetiapine     Other  reaction(s): Other (See Comments) Possible Hair Loss  . Sulfa Antibiotics Rash    Social History   Tobacco Use  . Smoking status: Former Smoker    Packs/day: 0.25    Years: 4.00    Pack years: 1.00    Types: Cigarettes    Last attempt to quit: 03/21/1988    Years since quitting: 29.7  . Smokeless tobacco: Never Used  Substance Use Topics  . Alcohol use: Yes    Comment: 1-2 times a year.     Family History  Problem Relation Age of Onset  . Hypertension Mother   . Kidney failure Mother   . Hypertension Father   . Diabetes Cousin   . Diabetes Sister   . Hypertension Sister   . Kidney disease Sister        dialysis  . Hypertension Brother   . Heart attack Brother   . Hypertension Maternal Aunt   . Hypertension Maternal Uncle   . Hypertension Maternal Grandmother   . Hypertension Maternal Grandfather   . Hypertension Paternal Grandmother   . Hypertension Paternal Grandfather   . Heart attack Brother   . Hypertension Brother   . Hypertension Sister       Review of Systems  Constitutional: negative for fatigue and weight loss Respiratory: negative for cough and wheezing Cardiovascular: negative for chest pain, fatigue and palpitations Gastrointestinal: negative for abdominal pain and change in bowel habits Musculoskeletal:negative for myalgias Neurological: negative for gait problems and tremors Behavioral/Psych: negative for abusive relationship, depression Endocrine: negative for temperature intolerance    Genitourinary:negative for abnormal menstrual periods, genital lesions, hot flashes, sexual problems and vaginal discharge Integument/breast: negative for breast lump, breast tenderness, nipple discharge and skin lesion(s)    Objective:       BP (!) 156/104   Pulse 90   Ht 4\' 11"  (1.499 m)   Wt 159 lb (72.1 kg)   BMI 32.11 kg/m  General:   alert  Skin:   no rash or abnormalities  Lungs:   clear to auscultation bilaterally  Heart:   regular rate and  rhythm, S1, S2 normal, no murmur, click, rub or gallop  Breasts:   normal without suspicious masses, skin or nipple changes or axillary nodes  Abdomen:  normal findings: no organomegaly, soft, non-tender and no hernia  Pelvis:  External genitalia: normal general appearance Urinary system: urethral meatus normal and bladder without fullness, nontender Vaginal: normal without tenderness, induration or masses Cervix: not visualized.  Patient did not tolerate exam Adnexa: normal bimanual exam Uterus: anteverted and non-tender, normal size   Lab Review Urine pregnancy test Labs reviewed yes Radiologic studies reviewed yes  50% of  20 min visit spent on counseling and coordination of care.   Assessment:     1. Encounter for routine gynecological examination with Papanicolaou smear of cervix  2. Vaginal discharge Rx: - Cytology - PAP - HIV Antibody (routine testing w rflx)  3. Essential hypertension, benign - managed by PCP  4. Chronic kidney disease, stage IV (severe) ([place]) - managed by PCP  5. Frequency of urination Rx: - POCT Urinalysis Dipstick - Urine Culture  6. Screening for STD (sexually transmitted disease) Rx: - Cervicovaginal ancillary only - Hepatitis B surface antigen - Hepatitis C antibody - RPR  7. Screening breast examination Rx: - MM Digital Screening; Future  8. Herpes simplex vulvovaginitis Rx: - valACYclovir (VALTREX) 1000 MG tablet; Take 0.5 tablets (500 mg total) by mouth daily.  Dispense: 30 tablet; Refill: 5    Plan:    Education reviewed: calcium supplements, depression evaluation, low fat, low cholesterol diet, safe sex/STD prevention, self breast exams and weight bearing exercise. Contraception: IUD. Follow up in: 1 year.   No orders of the defined types were placed in this encounter.  Orders Placed This Encounter  Procedures  . Urine Culture  . MM Digital Screening    Standing Status:   Future    Standing Expiration Date:    02/01/2019    Order Specific Question:   Reason for Exam (SYMPTOM  OR DIAGNOSIS REQUIRED)    Answer:   Screening    Order Specific Question:   Is the patient pregnant?    Answer:   No    Order Specific Question:   Preferred imaging location?    Answer:   [facility]  . Hepatitis B surface antigen  . Hepatitis C antibody  . HIV Antibody (routine testing w rflx)  . RPR  . POCT Urinalysis Dipstick    Shelly Bombard MD 12-01-2017

## 2017-12-02 LAB — HEPATITIS C ANTIBODY: Hep C Virus Ab: <0.1 s/co ratio (ref 0.0–0.9)

## 2017-12-02 LAB — HEPATITIS B SURFACE ANTIGEN: Hepatitis B Surface Ag: NEGATIVE

## 2017-12-02 LAB — HIV ANTIBODY (ROUTINE TESTING W REFLEX): HIV Screen 4th Generation wRfx: NONREACTIVE

## 2017-12-02 LAB — RPR: RPR Ser Ql: NONREACTIVE

## 2017-12-03 LAB — URINE CULTURE

## 2017-12-04 ENCOUNTER — Other Ambulatory Visit: Payer: Self-pay | Admitting: Obstetrics

## 2017-12-04 DIAGNOSIS — B3731 Acute candidiasis of vulva and vagina: Secondary | ICD-10-CM

## 2017-12-04 DIAGNOSIS — B373 Candidiasis of vulva and vagina: Secondary | ICD-10-CM

## 2017-12-04 LAB — CERVICOVAGINAL ANCILLARY ONLY
Bacterial vaginitis: NEGATIVE
Candida vaginitis: POSITIVE — AB
Chlamydia: NEGATIVE
Neisseria Gonorrhea: NEGATIVE
Trichomonas: NEGATIVE

## 2017-12-04 MED ORDER — FLUCONAZOLE 150 MG PO TABS: 150.0000 mg | ORAL_TABLET | Freq: Once | ORAL | 2 refills | Status: AC

## 2017-12-07 ENCOUNTER — Telehealth: Payer: Self-pay

## 2017-12-07 NOTE — Telephone Encounter (Signed)
Spoke with pt relaying message below.   

## 2017-12-07 NOTE — Telephone Encounter (Signed)
-----   Message from Cameron Sprang, MD sent at 12/07/2017  1:28 PM EDT ----- Regarding: MRI results Pls let her know the MRI brain did not show any evidence of tumor, stroke, or bleed. It showed age-related changes, and changes seen in patients with high blood pressure. Very important to continue control of BP, cholesterol, and sugar levels. Thanks

## 2017-12-08 ENCOUNTER — Telehealth: Payer: Self-pay | Admitting: Internal Medicine

## 2017-12-19 ENCOUNTER — Telehealth: Payer: Self-pay | Admitting: Neurology

## 2017-12-19 NOTE — Telephone Encounter (Signed)
MRI brain without contrast done 11/29/17 at [facility]:  There are extensive scattered and confluent foci of high T2/FLAIR signal abnormaltiy within the periventricular white matter increased for age. FLAIR signal is most significant in the paramedian frontal lobes, periventricular white matter, and external capsules and to lesser extent parietal lobe periventricular white matter. Ventricles normal. No acute changes.  Per report, While these findings may reflect advanced chronic small vessel ischemic change this finding is nonspecific and may be seen in the setting of vasculopathy, hypertensive change, or other forms of encephalopathy such as CADASIL which is not excluded.

## 2017-12-20 ENCOUNTER — Encounter: Payer: Self-pay | Admitting: Internal Medicine

## 2017-12-20 ENCOUNTER — Ambulatory Visit: Payer: Medicare Other

## 2017-12-20 ENCOUNTER — Ambulatory Visit (INDEPENDENT_AMBULATORY_CARE_PROVIDER_SITE_OTHER): Payer: Medicare Other | Admitting: Internal Medicine

## 2017-12-20 VITALS — BP 184/118 | HR 98 | Ht 59.0 in | Wt 158.6 lb

## 2017-12-20 DIAGNOSIS — Z79899 Other long term (current) drug therapy: Secondary | ICD-10-CM | POA: Diagnosis not present

## 2017-12-20 DIAGNOSIS — I10 Essential (primary) hypertension: Secondary | ICD-10-CM | POA: Diagnosis not present

## 2017-12-20 MED ORDER — CHLORTHALIDONE 25 MG PO TABS: 25.0000 mg | ORAL_TABLET | Freq: Every day | ORAL | 3 refills | Status: DC

## 2017-12-20 MED ORDER — AMLODIPINE BESYLATE 5 MG PO TABS: 5.0000 mg | ORAL_TABLET | Freq: Every day | ORAL | 3 refills | Status: DC

## 2017-12-20 NOTE — Patient Instructions (Addendum)
Medication Instructions:   START amlodipine 5mg  daily START chlorthalidone 25mg  daily CONTINUE other medications  Labwork:  BMET in one week   Testing/Procedures:  NONE  Follow-Up:  Your physician recommends that you schedule a follow-up appointment 4-6 weeks with hypertension clinic (pharmacy staff)  If you monitor your BP at home, please bring your BP cuff and list of readings to your next appointment   If you need a refill on your cardiac medications before your next appointment, please call your pharmacy.  Any Other Special Instructions Will Be Listed Below (If Applicable).

## 2017-12-20 NOTE — Progress Notes (Signed)
OFFICE CONSULT NOTE  Chief Complaint:  Uncontrolled hypertension  Primary Care Physician: Patient, No Pcp Per  HPI:  [name] is a 52 y.o. female who is being seen today for the evaluation of syncope at the request of Lance Sell, NP. This is a 52 yo female with a history of anxiety, bipolar disorder, anemia, HTN and seizures. She was seen in the ER on 01/23/2017 for syncope. She had reported generalized fatigue and weakness over the past month leading up to this.  She apparently had an unwitnessed syncopal event.  She said she felt lightheaded when raising up from a seated position.  She also had reported several episodes of fecal incontinence and has a history of IBS as well followed by Dr. Benson Norway.  This is also associated with a chronic abdominal pain.  ER workup was negative without evidence of significant anemia, dehydration, EKG abnormalities or abnormal troponin.  Cardiology follow-up was recommended.  Went to that she was seen in the ER about a week later for seizure.  She said she had stopped taking her Valium.  She also takes Topamax.  Blood pressure is noted to be low today at 102/82.  12/20/2017  [name] returns today for follow-up.  She was scheduled to be followed up as needed however returns today with marked hypertension.  Blood pressure is 184/118.  She says that her PCP may have taken her off of her diuretic and her amlodipine.  Previously when I saw her blood pressure was running low and she had had a syncopal episode.  I actually reduced her amlodipine to 2.5 mg from 5 mg.  Currently she is only on carvedilol.  She has not had any further syncopal episodes.  The elevated blood pressure today however could represent labile hypertension.  Most of her blood pressure readings recently have demonstrated diastolic blood pressures over 90 and systolic pressures in the 150-180 range.  She denies chest pain.  She has seen [facility] Delice [facility]ch at [facility] neurology and a head MRI  demonstrated small vessel changes consistent with hypertensive disease.  PMHx:  Past Medical History:  Diagnosis Date  . Anemia   . Anxiety   . Bipolar disorder ([place])   . Depression   . History of blood transfusion   . Hypertension   . IUD    HISTORY OF IUD --REMOVED IN 2006  . Seizures ([place]) [id number]   two seizures due to a med changes    Past Surgical History:  Procedure Laterality Date  . CARPAL TUNNEL RELEASE Right 07/08/2014   Procedure: RIGHT CARPAL TUNNEL RELEASE;  Surgeon: Sanjuana Kava, MD;  Location: [facility];  Service: Orthopedics;  Laterality: Right;  . CHOLECYSTECTOMY    . GASTRIC BYPASS  2000  . HYSTEROSCOPY W/D&C  11/14/2011   Procedure: DILATATION AND CURETTAGE /HYSTEROSCOPY;  Surgeon: Marylynn Pearson, MD;  Location: [facility];  Service: Gynecology;;  with removal of polyps  . INTRAUTERINE DEVICE (IUD) INSERTION  03/21/2010  . LAPAROSCOPIC GASTROTOMY W/ REPAIR OF ULCER    . PANNICULECTOMY    . ROTATOR CUFF REPAIR      FAMHx:  Family History  Problem Relation Age of Onset  . Hypertension Mother   . Kidney failure Mother   . Hypertension Father   . Diabetes Cousin   . Diabetes Sister   . Hypertension Sister   . Kidney disease Sister        dialysis  . Hypertension Brother   . Heart attack Brother   .  Hypertension Maternal Aunt   . Hypertension Maternal Uncle   . Hypertension Maternal Grandmother   . Hypertension Maternal Grandfather   . Hypertension Paternal Grandmother   . Hypertension Paternal Grandfather   . Heart attack Brother   . Hypertension Brother   . Hypertension Sister     SOCHx:   reports that she quit smoking about 29 years ago. Her smoking use included cigarettes. She has a 1.00 pack-year smoking history. She has never used smokeless tobacco. She reports that she drinks alcohol. She reports that she does not use drugs.  ALLERGIES:  Allergies  Allergen Reactions  . Ambien [Zolpidem Tartrate]     "BLACK OUT", SLEEP DRIVING  . Geodon  [Ziprasidone Hcl]     Seizure activity  . Lactulose Itching  . Ondansetron Nausea And Vomiting  . Quetiapine     Other reaction(s): Other (See Comments) Possible Hair Loss  . Sulfa Antibiotics Rash    ROS: Pertinent items noted in HPI and remainder of comprehensive ROS otherwise negative.  HOME MEDS: Current Outpatient Medications on File Prior to Visit  Medication Sig Dispense Refill  . acetaminophen (TYLENOL 8 HOUR) 650 MG CR tablet Take 650 mg by mouth every 8 (eight) hours as needed for pain.    . carvedilol (COREG) 12.5 MG tablet TAKE (1) TABLET BY MOUTH TWICE DAILY WITH A MEAL. 180 tablet 0  . diphenhydrAMINE (BENADRYL ALLERGY) 25 MG tablet Take 25 mg by mouth as needed.    . levETIRAcetam (KEPPRA) 500 MG tablet Take 1 tablet every night 30 tablet 6  . tiZANidine (ZANAFLEX) 4 MG tablet Take one every 12 hours as needed for spasm. 60 tablet 4  . traMADol (ULTRAM) 50 MG tablet Take one tablet by mouth every six hours as needed. 90 tablet 4  . valACYclovir (VALTREX) 1000 MG tablet Take 0.5 tablets (500 mg total) by mouth daily. 30 tablet 5  . venlafaxine XR (EFFEXOR-XR) 150 MG 24 hr capsule Take 1 capsule (150 mg total) by mouth 2 (two) times daily. 180 capsule 0   No current facility-administered medications on file prior to visit.     LABS/IMAGING: No results found for this or any previous visit (from the past 48 hour(s)). No results found.  LIPID PANEL: No results found for: CHOL, TRIG, HDL, CHOLHDL, VLDL, LDLCALC, LDLDIRECT  WEIGHTS: Wt Readings from Last 3 Encounters:  12/20/17 158 lb 9.6 oz (71.9 kg)  12/01/17 159 lb (72.1 kg)  11/29/17 159 lb (72.1 kg)    VITALS: BP (!) 184/118   Pulse 98   Ht 4\' 11"  (1.499 m)   Wt 158 lb 9.6 oz (71.9 kg)   BMI 32.03 kg/m   EXAM: General appearance: alert and no distress Neck: no carotid bruit, no JVD and thyroid not enlarged, symmetric, no tenderness/mass/nodules Lungs: clear to auscultation bilaterally Heart: regular  rate and rhythm, S1, S2 normal, no murmur, click, rub or gallop Abdomen: soft, non-tender; bowel sounds normal; no masses,  no organomegaly Extremities: edema 1+ pitting edema Pulses: 2+ and symmetric Skin: Skin color, texture, turgor normal. No rashes or lesions Neurologic: Grossly normal Psych: Pleasant  EKG: Normal sinus rhythm 98, possible left atrial enlargement, poor R wave progression-personally reviewed  ASSESSMENT: 1. Vasovagal syncope 2. ?seizure history - she feels she only had one remote event related to Geodon 3. Labile hypertension 4. LE edema  PLAN: 1.   Mrs. Leu is off of a lot of her blood pressure medicines but is now experiencing significant hypertension.  She had edema which seems to be a side effect of her amlodipine however this worked well for her.  I like to restart that as well as chlorthalidone in addition to her carvedilol.  We will plan a repeat metabolic profile in a week and follow-up in the hypertension clinic for blood pressure monitoring in about 4 to 6 weeks.  I encouraged her to purchase a blood pressure cuff and monitor her blood pressures at home prior to returning.  Pixie Casino, MD, [facility], [place] Director of the Advanced Lipid Disorders &  Cardiovascular Risk Reduction Clinic Diplomate of the American Board of Clinical Lipidology Attending Cardiologist  Direct Dial: [phone]  Fax: [phone]  Website:  [email or web address] 12/20/2017, 3:28 PM

## 2017-12-27 ENCOUNTER — Ambulatory Visit: Payer: Medicare Other | Admitting: Internal Medicine

## 2017-12-27 ENCOUNTER — Other Ambulatory Visit: Payer: Self-pay | Admitting: *Deleted

## 2017-12-27 DIAGNOSIS — Z79899 Other long term (current) drug therapy: Secondary | ICD-10-CM | POA: Diagnosis not present

## 2017-12-27 DIAGNOSIS — I10 Essential (primary) hypertension: Secondary | ICD-10-CM

## 2017-12-27 MED ORDER — CARVEDILOL 12.5 MG PO TABS: ORAL_TABLET | ORAL | 3 refills | Status: DC

## 2017-12-28 LAB — BASIC METABOLIC PANEL
BUN/Creatinine Ratio: 13 (ref 9–23)
BUN: 44 mg/dL — ABNORMAL HIGH (ref 6–24)
CO2: 21 mmol/L (ref 20–29)
Calcium: 7.2 mg/dL — ABNORMAL LOW (ref 8.7–10.2)
Chloride: 104 mmol/L (ref 96–106)
Creatinine, Ser: 3.33 mg/dL — ABNORMAL HIGH (ref 0.57–1.00)
GFR calc Af Amer: 18 mL/min/{1.73_m2} — ABNORMAL LOW (ref >59)
GFR calc non Af Amer: 15 mL/min/{1.73_m2} — ABNORMAL LOW (ref >59)
Glucose: 70 mg/dL (ref 65–99)
Potassium: 3.7 mmol/L (ref 3.5–5.2)
Sodium: 145 mmol/L — ABNORMAL HIGH (ref 134–144)

## 2018-01-03 ENCOUNTER — Other Ambulatory Visit: Payer: Self-pay | Admitting: *Deleted

## 2018-01-03 DIAGNOSIS — Z79899 Other long term (current) drug therapy: Secondary | ICD-10-CM

## 2018-01-08 ENCOUNTER — Other Ambulatory Visit: Payer: Self-pay | Admitting: Nurse Practitioner

## 2018-01-08 DIAGNOSIS — F3160 Bipolar disorder, current episode mixed, unspecified: Secondary | ICD-10-CM

## 2018-01-10 ENCOUNTER — Ambulatory Visit: Payer: Medicare Other | Admitting: Orthopaedic Surgery

## 2018-01-11 ENCOUNTER — Telehealth: Payer: Self-pay | Admitting: Internal Medicine

## 2018-01-11 NOTE — Telephone Encounter (Signed)
New message  Pt c/o medication issue:  1. Name of Medication: amLODipine (NORVASC) 5 MG tablet  2. How are you currently taking this medication (dosage and times per day)? 1 time daily  3. Are you having a reaction (difficulty breathing--STAT)? No   4. What is your medication issue? Patient states that the medication is causing issues with arthritis and swelling.

## 2018-01-11 NOTE — Telephone Encounter (Signed)
Pt called to report that she has stopped her Amlodipine for a week since she thought it was worsening her arthritis and she says that is has improved so she would like something else for her BP.  She has also not been in for her bloodwork that [name] requested based on her abnormal BMET...  Pt reports that she has since moved to [place] but still wants [name] to manage her BP.. I advised her that she needs to have her labs redrawn because it is difficult for [name] or any MD to manage her BP meds without knowing if her kidney function tests have improved. She says she cannot have labs done until her appt with her new PMD on 01/26/18.. I I advised her that I will notify [name] of the change in her status and let her know his recommendations going forward. Pt says she will periodically come to [place] but cannot guarantee when and would still like to have [name] manage her.  I told her that may not be a safe option for her but will forward to [facility] for his preference and will call her back. She only has a few BP's to report... 125/85 yesterday and 135/86 today.

## 2018-01-12 ENCOUNTER — Ambulatory Visit: Payer: Self-pay | Admitting: Family Medicine

## 2018-01-12 NOTE — Telephone Encounter (Signed)
Would continue current meds - I'm happy to continue seeing her if she goes back and forth.  Dr. Lemmie Evens

## 2018-01-12 NOTE — Telephone Encounter (Signed)
Patient stopped chlorthalidone per last BMET and has not repeated labs.  Patient stopped amlodipine d/t arthritis symptoms and her symptoms improved.   Should she continue ONLY carvedilol for BP based on 2 readings provided?

## 2018-01-12 NOTE — Telephone Encounter (Signed)
Yes .. The BP's look good to me.  Dr. Lemmie Evens

## 2018-01-15 NOTE — Telephone Encounter (Signed)
LMTCB to discuss MD recommendation to remain ONLY on carvedilol at present time. Med list updated.

## 2018-01-18 NOTE — Telephone Encounter (Signed)
LMTCB

## 2018-01-23 ENCOUNTER — Ambulatory Visit: Payer: Medicare Other | Admitting: Orthopaedic Surgery

## 2018-01-31 ENCOUNTER — Ambulatory Visit: Payer: Medicare Other

## 2018-01-31 NOTE — Progress Notes (Deleted)
01/31/2018 [name] [birth year] [id number]   HPI:  [name] is a 52 y.o. female patient of Dr Debara Pickett, with a PMH below who presents today for hypertension clinic evaluation.  In addition to hypertension, her medical history is significant for vasovagal syncope, CKD (stage 4), bipolar disorder, seizure disorder, anxiety and anemia.  She was originally seen by Dr. Debara Pickett in January after an ED visit for an unwitnessed syncopal event.  At that time her amlodipine was decreased from 5 mg to 2.5 mg daily because of some peripheral edema.  She then returned last month with marked hypertension.  She noted that she had stopped (per PCP request?) her amlodipine and diuretic (furosemide ?).  She reported home blood pressures of 150-180/90+ regularly.  She was restarted on amlodipine 5 mg and given chlorthalidone 25 mg daily as well.  Repeat BMET done after 1 week showed her SCr jump from 2.2 to 3.3.  Chlorthalidone was discontinued.    Patient presents today for evaluation.    Blood Pressure Goal:  130/80  Current Medications:  Family Hx:  Social Hx:  Diet:  Exercise:  Home BP readings:  Intolerances:   Labs:  Wt Readings from Last 3 Encounters:  12/20/17 158 lb 9.6 oz (71.9 kg)  12/01/17 159 lb (72.1 kg)  11/29/17 159 lb (72.1 kg)   BP Readings from Last 3 Encounters:  12/20/17 (!) 184/118  12/01/17 (!) 156/104  11/29/17 (!) 152/90   Pulse Readings from Last 3 Encounters:  12/20/17 98  12/01/17 90  11/29/17 83    Current Outpatient Medications  Medication Sig Dispense Refill  . acetaminophen (TYLENOL 8 HOUR) 650 MG CR tablet Take 650 mg by mouth every 8 (eight) hours as needed for pain.    . carvedilol (COREG) 12.5 MG tablet TAKE (1) TABLET BY MOUTH TWICE DAILY WITH A MEAL. 180 tablet 3  . diphenhydrAMINE (BENADRYL ALLERGY) 25 MG tablet Take 25 mg by mouth as needed.    . levETIRAcetam (KEPPRA) 500 MG tablet Take 1 tablet every night 30 tablet 6  .  tiZANidine (ZANAFLEX) 4 MG tablet Take one every 12 hours as needed for spasm. 60 tablet 4  . traMADol (ULTRAM) 50 MG tablet Take one tablet by mouth every six hours as needed. 90 tablet 4  . valACYclovir (VALTREX) 1000 MG tablet Take 0.5 tablets (500 mg total) by mouth daily. 30 tablet 5  . venlafaxine XR (EFFEXOR-XR) 150 MG 24 hr capsule Take 1 capsule (150 mg total) by mouth 2 (two) times daily. Needs appointment for future refills. 180 capsule 0   No current facility-administered medications for this visit.     Allergies  Allergen Reactions  . Ambien [Zolpidem Tartrate]     "BLACK OUT", SLEEP DRIVING  . Geodon [Ziprasidone Hcl]     Seizure activity  . Lactulose Itching  . Ondansetron Nausea And Vomiting  . Quetiapine     Other reaction(s): Other (See Comments) Possible Hair Loss  . Sulfa Antibiotics Rash    Past Medical History:  Diagnosis Date  . Anemia   . Anxiety   . Bipolar disorder ([place])   . Depression   . History of blood transfusion   . Hypertension   . IUD    HISTORY OF IUD --REMOVED IN 2006  . Seizures ([place]) [id number]   two seizures due to a med changes    There were no vitals taken for this visit.  No problem-specific Assessment & Plan notes found  for this encounter.   Tommy Medal PharmD CPP [facility] [address] [place], [place] [address] [phone]

## 2018-02-12 NOTE — Telephone Encounter (Signed)
Patient has not returned calls. MyChart message sent with MD recommendation

## 2018-03-06 ENCOUNTER — Other Ambulatory Visit: Payer: Self-pay | Admitting: Neurology

## 2018-03-06 DIAGNOSIS — R569 Unspecified convulsions: Secondary | ICD-10-CM

## 2018-03-08 ENCOUNTER — Other Ambulatory Visit: Payer: Self-pay | Admitting: Neurology

## 2018-03-08 DIAGNOSIS — R569 Unspecified convulsions: Secondary | ICD-10-CM

## 2018-03-22 DIAGNOSIS — Z1384 Encounter for screening for dental disorders: Secondary | ICD-10-CM | POA: Diagnosis not present

## 2018-03-22 DIAGNOSIS — Z23 Encounter for immunization: Secondary | ICD-10-CM | POA: Diagnosis not present

## 2018-03-22 DIAGNOSIS — Z114 Encounter for screening for human immunodeficiency virus [HIV]: Secondary | ICD-10-CM | POA: Diagnosis not present

## 2018-03-22 DIAGNOSIS — Z124 Encounter for screening for malignant neoplasm of cervix: Secondary | ICD-10-CM | POA: Diagnosis not present

## 2018-03-22 DIAGNOSIS — Z1211 Encounter for screening for malignant neoplasm of colon: Secondary | ICD-10-CM | POA: Diagnosis not present

## 2018-03-22 DIAGNOSIS — N189 Chronic kidney disease, unspecified: Secondary | ICD-10-CM | POA: Diagnosis not present

## 2018-03-22 DIAGNOSIS — Z0000 Encounter for general adult medical examination without abnormal findings: Secondary | ICD-10-CM | POA: Diagnosis not present

## 2018-03-22 DIAGNOSIS — E669 Obesity, unspecified: Secondary | ICD-10-CM | POA: Diagnosis not present

## 2018-03-22 DIAGNOSIS — I10 Essential (primary) hypertension: Secondary | ICD-10-CM | POA: Diagnosis not present

## 2018-03-22 DIAGNOSIS — M1990 Unspecified osteoarthritis, unspecified site: Secondary | ICD-10-CM | POA: Insufficient documentation

## 2018-03-22 DIAGNOSIS — D649 Anemia, unspecified: Secondary | ICD-10-CM | POA: Diagnosis not present

## 2018-03-22 DIAGNOSIS — M19011 Primary osteoarthritis, right shoulder: Secondary | ICD-10-CM | POA: Diagnosis not present

## 2018-03-22 DIAGNOSIS — N183 Chronic kidney disease, stage 3 (moderate): Secondary | ICD-10-CM | POA: Diagnosis not present

## 2018-03-22 DIAGNOSIS — Z1239 Encounter for other screening for malignant neoplasm of breast: Secondary | ICD-10-CM | POA: Diagnosis not present

## 2018-03-22 DIAGNOSIS — Z1231 Encounter for screening mammogram for malignant neoplasm of breast: Secondary | ICD-10-CM | POA: Diagnosis not present

## 2018-03-22 DIAGNOSIS — F319 Bipolar disorder, unspecified: Secondary | ICD-10-CM | POA: Diagnosis not present

## 2018-03-22 DIAGNOSIS — Z131 Encounter for screening for diabetes mellitus: Secondary | ICD-10-CM | POA: Diagnosis not present

## 2018-03-22 DIAGNOSIS — Z9884 Bariatric surgery status: Secondary | ICD-10-CM | POA: Diagnosis not present

## 2018-03-22 DIAGNOSIS — Z13220 Encounter for screening for lipoid disorders: Secondary | ICD-10-CM | POA: Diagnosis not present

## 2018-03-22 DIAGNOSIS — Z8619 Personal history of other infectious and parasitic diseases: Secondary | ICD-10-CM | POA: Insufficient documentation

## 2018-03-28 DIAGNOSIS — D519 Vitamin B12 deficiency anemia, unspecified: Secondary | ICD-10-CM | POA: Diagnosis not present

## 2018-03-28 DIAGNOSIS — N183 Chronic kidney disease, stage 3 (moderate): Secondary | ICD-10-CM | POA: Diagnosis not present

## 2018-03-28 DIAGNOSIS — Z114 Encounter for screening for human immunodeficiency virus [HIV]: Secondary | ICD-10-CM | POA: Diagnosis not present

## 2018-03-28 DIAGNOSIS — N189 Chronic kidney disease, unspecified: Secondary | ICD-10-CM | POA: Diagnosis not present

## 2018-03-28 DIAGNOSIS — F418 Other specified anxiety disorders: Secondary | ICD-10-CM | POA: Diagnosis not present

## 2018-03-29 DIAGNOSIS — R748 Abnormal levels of other serum enzymes: Secondary | ICD-10-CM | POA: Insufficient documentation

## 2018-03-29 DIAGNOSIS — E559 Vitamin D deficiency, unspecified: Secondary | ICD-10-CM | POA: Insufficient documentation

## 2018-03-29 DIAGNOSIS — E538 Deficiency of other specified B group vitamins: Secondary | ICD-10-CM | POA: Insufficient documentation

## 2018-05-09 ENCOUNTER — Encounter: Payer: Self-pay | Admitting: Neurology

## 2018-05-30 ENCOUNTER — Other Ambulatory Visit: Payer: Self-pay | Admitting: Orthopaedic Surgery

## 2018-06-13 ENCOUNTER — Ambulatory Visit: Payer: Self-pay | Admitting: Neurology

## 2018-06-14 ENCOUNTER — Ambulatory Visit: Payer: Self-pay | Admitting: Neurology

## 2018-07-19 ENCOUNTER — Other Ambulatory Visit: Payer: Self-pay | Admitting: Orthopaedic Surgery

## 2018-08-30 ENCOUNTER — Other Ambulatory Visit: Payer: Self-pay | Admitting: Neurology

## 2018-08-30 DIAGNOSIS — R569 Unspecified convulsions: Secondary | ICD-10-CM

## 2018-09-01 ENCOUNTER — Other Ambulatory Visit: Payer: Self-pay | Admitting: Neurology

## 2018-09-01 DIAGNOSIS — R569 Unspecified convulsions: Secondary | ICD-10-CM

## 2018-09-08 ENCOUNTER — Encounter (HOSPITAL_COMMUNITY): Payer: Self-pay | Admitting: Emergency Medicine

## 2018-09-08 ENCOUNTER — Other Ambulatory Visit: Payer: Self-pay

## 2018-09-08 ENCOUNTER — Inpatient Hospital Stay (HOSPITAL_COMMUNITY)
Admission: EM | Admit: 2018-09-08 | Discharge: 2018-09-11 | DRG: 304 | Disposition: A | Discharge status: Home / Self Care | Payer: Medicare Other | Attending: Internal Medicine | Admitting: Internal Medicine

## 2018-09-08 ENCOUNTER — Emergency Department (HOSPITAL_COMMUNITY): Payer: Medicare Other

## 2018-09-08 DIAGNOSIS — I160 Hypertensive urgency: Secondary | ICD-10-CM | POA: Diagnosis not present

## 2018-09-08 DIAGNOSIS — F3160 Bipolar disorder, current episode mixed, unspecified: Secondary | ICD-10-CM | POA: Diagnosis not present

## 2018-09-08 DIAGNOSIS — Z9049 Acquired absence of other specified parts of digestive tract: Secondary | ICD-10-CM | POA: Diagnosis not present

## 2018-09-08 DIAGNOSIS — Z841 Family history of disorders of kidney and ureter: Secondary | ICD-10-CM

## 2018-09-08 DIAGNOSIS — Z9114 Patient's other noncompliance with medication regimen: Secondary | ICD-10-CM | POA: Diagnosis not present

## 2018-09-08 DIAGNOSIS — Z87891 Personal history of nicotine dependence: Secondary | ICD-10-CM | POA: Diagnosis not present

## 2018-09-08 DIAGNOSIS — Z9119 Patient's noncompliance with other medical treatment and regimen: Secondary | ICD-10-CM

## 2018-09-08 DIAGNOSIS — R600 Localized edema: Secondary | ICD-10-CM | POA: Diagnosis not present

## 2018-09-08 DIAGNOSIS — I10 Essential (primary) hypertension: Secondary | ICD-10-CM

## 2018-09-08 DIAGNOSIS — R402362 Coma scale, best motor response, obeys commands, at arrival to emergency department: Secondary | ICD-10-CM | POA: Diagnosis present

## 2018-09-08 DIAGNOSIS — R51 Headache: Secondary | ICD-10-CM | POA: Diagnosis not present

## 2018-09-08 DIAGNOSIS — N281 Cyst of kidney, acquired: Secondary | ICD-10-CM | POA: Diagnosis not present

## 2018-09-08 DIAGNOSIS — I6783 Posterior reversible encephalopathy syndrome: Secondary | ICD-10-CM | POA: Diagnosis present

## 2018-09-08 DIAGNOSIS — N179 Acute kidney failure, unspecified: Secondary | ICD-10-CM | POA: Diagnosis present

## 2018-09-08 DIAGNOSIS — F319 Bipolar disorder, unspecified: Secondary | ICD-10-CM | POA: Diagnosis present

## 2018-09-08 DIAGNOSIS — Z882 Allergy status to sulfonamides status: Secondary | ICD-10-CM | POA: Diagnosis not present

## 2018-09-08 DIAGNOSIS — N184 Chronic kidney disease, stage 4 (severe): Secondary | ICD-10-CM | POA: Diagnosis present

## 2018-09-08 DIAGNOSIS — N189 Chronic kidney disease, unspecified: Secondary | ICD-10-CM

## 2018-09-08 DIAGNOSIS — Z9884 Bariatric surgery status: Secondary | ICD-10-CM | POA: Diagnosis not present

## 2018-09-08 DIAGNOSIS — N186 End stage renal disease: Secondary | ICD-10-CM | POA: Diagnosis present

## 2018-09-08 DIAGNOSIS — I161 Hypertensive emergency: Secondary | ICD-10-CM | POA: Diagnosis present

## 2018-09-08 DIAGNOSIS — Z79891 Long term (current) use of opiate analgesic: Secondary | ICD-10-CM | POA: Diagnosis not present

## 2018-09-08 DIAGNOSIS — Z20828 Contact with and (suspected) exposure to other viral communicable diseases: Secondary | ICD-10-CM | POA: Diagnosis present

## 2018-09-08 DIAGNOSIS — D631 Anemia in chronic kidney disease: Secondary | ICD-10-CM | POA: Diagnosis present

## 2018-09-08 DIAGNOSIS — I739 Peripheral vascular disease, unspecified: Secondary | ICD-10-CM | POA: Diagnosis present

## 2018-09-08 DIAGNOSIS — E669 Obesity, unspecified: Secondary | ICD-10-CM | POA: Diagnosis present

## 2018-09-08 DIAGNOSIS — F419 Anxiety disorder, unspecified: Secondary | ICD-10-CM | POA: Diagnosis present

## 2018-09-08 DIAGNOSIS — R402142 Coma scale, eyes open, spontaneous, at arrival to emergency department: Secondary | ICD-10-CM | POA: Diagnosis present

## 2018-09-08 DIAGNOSIS — Z833 Family history of diabetes mellitus: Secondary | ICD-10-CM

## 2018-09-08 DIAGNOSIS — Z888 Allergy status to other drugs, medicaments and biological substances status: Secondary | ICD-10-CM | POA: Diagnosis not present

## 2018-09-08 DIAGNOSIS — Z91148 Patient's other noncompliance with medication regimen for other reason: Secondary | ICD-10-CM

## 2018-09-08 DIAGNOSIS — R402252 Coma scale, best verbal response, oriented, at arrival to emergency department: Secondary | ICD-10-CM | POA: Diagnosis present

## 2018-09-08 DIAGNOSIS — Z79899 Other long term (current) drug therapy: Secondary | ICD-10-CM | POA: Diagnosis not present

## 2018-09-08 DIAGNOSIS — N271 Small kidney, bilateral: Secondary | ICD-10-CM | POA: Diagnosis present

## 2018-09-08 DIAGNOSIS — I129 Hypertensive chronic kidney disease with stage 1 through stage 4 chronic kidney disease, or unspecified chronic kidney disease: Secondary | ICD-10-CM | POA: Diagnosis not present

## 2018-09-08 DIAGNOSIS — I120 Hypertensive chronic kidney disease with stage 5 chronic kidney disease or end stage renal disease: Secondary | ICD-10-CM | POA: Diagnosis present

## 2018-09-08 DIAGNOSIS — Z8249 Family history of ischemic heart disease and other diseases of the circulatory system: Secondary | ICD-10-CM

## 2018-09-08 DIAGNOSIS — H538 Other visual disturbances: Secondary | ICD-10-CM | POA: Diagnosis present

## 2018-09-08 LAB — CBC WITH DIFFERENTIAL/PLATELET
Abs Immature Granulocytes: 0.02 10*3/uL (ref 0.00–0.07)
Basophils Absolute: 0 10*3/uL (ref 0.0–0.1)
Basophils Relative: 0 %
Eosinophils Absolute: 0 10*3/uL (ref 0.0–0.5)
Eosinophils Relative: 0 %
HCT: 38 % (ref 36.0–46.0)
Hemoglobin: 11.7 g/dL — ABNORMAL LOW (ref 12.0–15.0)
Immature Granulocytes: 0 %
Lymphocytes Relative: 26 %
Lymphs Abs: 1.5 10*3/uL (ref 0.7–4.0)
MCH: 27.4 pg (ref 26.0–34.0)
MCHC: 30.8 g/dL (ref 30.0–36.0)
MCV: 89 fL (ref 80.0–100.0)
Monocytes Absolute: 0.5 10*3/uL (ref 0.1–1.0)
Monocytes Relative: 8 %
Neutro Abs: 3.7 10*3/uL (ref 1.7–7.7)
Neutrophils Relative %: 66 %
Platelets: 199 10*3/uL (ref 150–400)
RBC: 4.27 MIL/uL (ref 3.87–5.11)
RDW: 17.8 % — ABNORMAL HIGH (ref 11.5–15.5)
WBC: 5.9 10*3/uL (ref 4.0–10.5)
nRBC: 0 % (ref 0.0–0.2)

## 2018-09-08 LAB — BASIC METABOLIC PANEL
Anion gap: 11 (ref 5–15)
BUN: 73 mg/dL — ABNORMAL HIGH (ref 6–20)
CO2: 21 mmol/L — ABNORMAL LOW (ref 22–32)
Calcium: 8.2 mg/dL — ABNORMAL LOW (ref 8.9–10.3)
Chloride: 108 mmol/L (ref 98–111)
Creatinine, Ser: 5.06 mg/dL — ABNORMAL HIGH (ref 0.44–1.00)
GFR calc Af Amer: 11 mL/min — ABNORMAL LOW (ref >60)
GFR calc non Af Amer: 9 mL/min — ABNORMAL LOW (ref >60)
Glucose, Bld: 93 mg/dL (ref 70–99)
Potassium: 3.8 mmol/L (ref 3.5–5.1)
Sodium: 140 mmol/L (ref 135–145)

## 2018-09-08 LAB — SARS CORONAVIRUS 2 BY RT PCR (HOSPITAL ORDER, PERFORMED IN ~~LOC~~ HOSPITAL LAB): SARS Coronavirus 2: NEGATIVE

## 2018-09-08 MED ORDER — DEXAMETHASONE SODIUM PHOSPHATE 10 MG/ML IJ SOLN
10.0000 mg | Freq: Once | INTRAMUSCULAR | Status: AC
  Administered 2018-09-08: 10 mg via INTRAVENOUS
  Filled 2018-09-08: qty 1

## 2018-09-08 MED ORDER — METOCLOPRAMIDE HCL 5 MG/ML IJ SOLN
10.0000 mg | Freq: Once | INTRAMUSCULAR | Status: AC
  Administered 2018-09-08: 10 mg via INTRAVENOUS
  Filled 2018-09-08: qty 2

## 2018-09-08 MED ORDER — BISACODYL 10 MG RE SUPP: 10.0000 mg | Freq: Every day | RECTAL | Status: DC | PRN

## 2018-09-08 MED ORDER — CHLORTHALIDONE 25 MG PO TABS: 25.0000 mg | ORAL_TABLET | Freq: Every day | ORAL | Status: DC

## 2018-09-08 MED ORDER — TRAMADOL HCL 50 MG PO TABS
50.0000 mg | ORAL_TABLET | Freq: Four times a day (QID) | ORAL | Status: DC | PRN
  Administered 2018-09-10 (×3): 50 mg via ORAL
  Filled 2018-09-08 (×3): qty 1

## 2018-09-08 MED ORDER — LABETALOL HCL 5 MG/ML IV SOLN: 0.5000 mg/min | INTRAVENOUS | Status: DC

## 2018-09-08 MED ORDER — SODIUM CHLORIDE 0.9 % IV SOLN: INTRAVENOUS | Status: DC

## 2018-09-08 MED ORDER — ASPIRIN EC 81 MG PO TBEC
81.0000 mg | DELAYED_RELEASE_TABLET | Freq: Every day | ORAL | Status: DC
  Administered 2018-09-08 – 2018-09-11 (×4): 81 mg via ORAL
  Filled 2018-09-08 (×4): qty 1

## 2018-09-08 MED ORDER — AMLODIPINE BESYLATE 5 MG PO TABS
5.0000 mg | ORAL_TABLET | Freq: Once | ORAL | Status: AC
  Administered 2018-09-08: 5 mg via ORAL
  Filled 2018-09-08: qty 1

## 2018-09-08 MED ORDER — NICARDIPINE HCL IN NACL 20-0.86 MG/200ML-% IV SOLN
3.0000 mg/h | INTRAVENOUS | Status: DC
  Administered 2018-09-08 – 2018-09-09 (×2): 5 mg/h via INTRAVENOUS
  Filled 2018-09-08 (×3): qty 200

## 2018-09-08 MED ORDER — TIZANIDINE HCL 4 MG PO TABS: 4.0000 mg | ORAL_TABLET | Freq: Two times a day (BID) | ORAL | Status: DC | PRN

## 2018-09-08 MED ORDER — ENOXAPARIN SODIUM 30 MG/0.3ML ~~LOC~~ SOLN
30.0000 mg | SUBCUTANEOUS | Status: DC
  Administered 2018-09-08 – 2018-09-10 (×3): 30 mg via SUBCUTANEOUS
  Filled 2018-09-08 (×3): qty 0.3

## 2018-09-08 MED ORDER — LABETALOL HCL 5 MG/ML IV SOLN
20.0000 mg | Freq: Once | INTRAVENOUS | Status: AC
  Administered 2018-09-08: 20 mg via INTRAVENOUS
  Filled 2018-09-08: qty 4

## 2018-09-08 MED ORDER — VENLAFAXINE HCL ER 75 MG PO CP24
150.0000 mg | ORAL_CAPSULE | Freq: Two times a day (BID) | ORAL | Status: DC
  Administered 2018-09-08 – 2018-09-11 (×6): 150 mg via ORAL
  Filled 2018-09-08 (×6): qty 2

## 2018-09-08 MED ORDER — DIPHENHYDRAMINE HCL 50 MG/ML IJ SOLN
25.0000 mg | Freq: Once | INTRAMUSCULAR | Status: AC
  Administered 2018-09-08: 25 mg via INTRAVENOUS
  Filled 2018-09-08: qty 1

## 2018-09-08 MED ORDER — SODIUM CHLORIDE 0.9 % IV SOLN
INTRAVENOUS | Status: DC
  Administered 2018-09-08 – 2018-09-09 (×2): via INTRAVENOUS

## 2018-09-08 MED ORDER — MORPHINE SULFATE (PF) 4 MG/ML IV SOLN
4.0000 mg | Freq: Once | INTRAVENOUS | Status: AC
  Administered 2018-09-08: 4 mg via INTRAVENOUS
  Filled 2018-09-08: qty 1

## 2018-09-08 NOTE — ED Notes (Addendum)
Pt reports she is supposed to be taking blood pressure medication but that "it doesn't work" so she doesn't take it. Pt c/o headache, right sided posterior neck pain, stiff neck, nausea, vomiting, phonopobia x 2 days. Denies photophobia.

## 2018-09-08 NOTE — ED Notes (Signed)
Cardene started with hospitalist in room

## 2018-09-08 NOTE — ED Notes (Signed)
Notified ac for med

## 2018-09-08 NOTE — H&P (Signed)
History and Physical  [name]:[id number] DOB: [birth year] DOA: 09/08/2018  Referring physician: Landis Martins, ED provider PCP: Pixie Casino, MD  Outpatient Specialists:   Patient Coming From: home  Chief Complaint: headache  HPI: [name] is a 53 y.o. female with a history of hypertension, bipolar disorder, depression.  Patient seen for worsening headache over the past 24 hours.  The headache is where she is ever had and unrelieved with medication at home.  No palliating factors.  Headache made worse with sounds.  Denies fevers, chills, nuchal rigidity.  She is supposed to be on chlorthalidone and amlodipine, although she has had some side effects to the medications and stopped taking them for several months.  Emergency Department Course: Pressures all in the 190s to 295 range systolically.  Patient received some labetalol IV without improvement.  Patient also given home medications and "headache cocktail" with mild improvement.  Creatinine elevated to 5.06.  Her last creatinine was October 2019 that was 3.33.  Prior to this (01/2017) her creatinine was 1.87.  At this time, I am not sure what her baseline creatinine is.  Other labs unremarkable.  Review of Systems:   Pt denies any fevers, chills, nausea, vomiting, diarrhea, constipation, abdominal pain, shortness of breath, dyspnea on exertion, orthopnea, cough, wheezing, palpitations, headache, vision changes, lightheadedness, dizziness, melena, rectal bleeding.  Review of systems are otherwise negative  Past Medical History:  Diagnosis Date  . Anemia   . Anxiety   . Bipolar disorder ([place])   . Depression   . History of blood transfusion   . Hypertension   . IUD    HISTORY OF IUD --REMOVED IN 2006  . Seizures ([place]) [id number]   two seizures due to a med changes   Past Surgical History:  Procedure Laterality Date  . CARPAL TUNNEL RELEASE Right 07/08/2014   Procedure: RIGHT CARPAL TUNNEL RELEASE;   Surgeon: Sanjuana Kava, MD;  Location: [facility];  Service: Orthopedics;  Laterality: Right;  . CHOLECYSTECTOMY    . GASTRIC BYPASS  2000  . HYSTEROSCOPY W/D&C  11/14/2011   Procedure: DILATATION AND CURETTAGE /HYSTEROSCOPY;  Surgeon: Marylynn Pearson, MD;  Location: [place] [facility];  Service: Gynecology;;  with removal of polyps  . INTRAUTERINE DEVICE (IUD) INSERTION  03/21/2010  . LAPAROSCOPIC GASTROTOMY W/ REPAIR OF ULCER    . PANNICULECTOMY    . ROTATOR CUFF REPAIR     Social History:  reports that she quit smoking about 30 years ago. Her smoking use included cigarettes. She has a 1.00 pack-year smoking history. She has never used smokeless tobacco. She reports current alcohol use. She reports that she does not use drugs. Patient lives at home  Allergies  Allergen Reactions  . Ambien [Zolpidem Tartrate]     "BLACK OUT", SLEEP DRIVING  . Geodon [Ziprasidone Hcl]     Seizure activity  . Lactulose Itching  . Ondansetron Nausea And Vomiting  . Quetiapine     Other reaction(s): Other (See Comments) Possible Hair Loss  . Sulfa Antibiotics Rash    Family History  Problem Relation Age of Onset  . Hypertension Mother   . Kidney failure Mother   . Hypertension Father   . Diabetes Cousin   . Diabetes Sister   . Hypertension Sister   . Kidney disease Sister        dialysis  . Hypertension Brother   . Heart attack Brother   . Hypertension Maternal Aunt   . Hypertension Maternal Uncle   .  Hypertension Maternal Grandmother   . Hypertension Maternal Grandfather   . Hypertension Paternal Grandmother   . Hypertension Paternal Grandfather   . Heart attack Brother   . Hypertension Brother   . Hypertension Sister       Prior to Admission medications   Medication Sig Start Date End Date Taking? Authorizing Provider  acetaminophen (TYLENOL 8 HOUR) 650 MG CR tablet Take 650 mg by mouth every 8 (eight) hours as needed for pain.   Yes [provider]  ferrous sulfate 325 (65 FE) MG tablet  Take 325 mg by mouth 3 (three) times daily with meals.   Yes [provider]  tiZANidine (ZANAFLEX) 4 MG tablet TAKE 1 TABLET BY MOUTH EVERY 12 HOURS AS NEEDED FOR SPASM Patient taking differently: Take 4 mg by mouth every 12 (twelve) hours as needed for muscle spasms.  07/24/18  Yes Sanjuana Kava, MD  traMADol (ULTRAM) 50 MG tablet TAKE 1 TABLET BY MOUTH EVERY 6 HOURS AS NEEDED Patient taking differently: Take 50 mg by mouth every 6 (six) hours as needed for moderate pain. TAKE 1 TABLET BY MOUTH EVERY 6 HOURS AS NEEDED 07/24/18  Yes Sanjuana Kava, MD  valACYclovir (VALTREX) 1000 MG tablet Take 0.5 tablets (500 mg total) by mouth daily. 12/01/17  Yes Shelly Bombard, MD  venlafaxine XR (EFFEXOR-XR) 150 MG 24 hr capsule Take 1 capsule (150 mg total) by mouth 2 (two) times daily. Needs appointment for future refills. 01/08/18  Yes Lance Sell, NP  vitamin B-12 (CYANOCOBALAMIN) 250 MCG tablet Take 250 mcg by mouth daily.   Yes [provider]    Physical Exam: BP (!) 146/96   Pulse 92   Temp 98.2 F (36.8 C) (Oral)   Resp 16   Ht 4\' 11"  (1.499 m)   Wt 73.9 kg   SpO2 97%   BMI 32.92 kg/m   . General: Age black female.. Awake and alert and oriented x3. No acute cardiopulmonary distress.  Marland Kitchen HEENT: Normocephalic atraumatic.  Right and left ears normal in appearance.  Pupils equal, round, reactive to light. Extraocular muscles are intact. Sclerae anicteric and noninjected.  Moist mucosal membranes. No mucosal lesions.  . Neck: Neck supple without lymphadenopathy. No carotid bruits. No masses palpated.  . Cardiovascular: Regular rate with normal S1-S2 sounds. No murmurs, rubs, gallops auscultated. No JVD.  Marland Kitchen Respiratory: Good respiratory effort with no wheezes, rales, rhonchi. Lungs clear to auscultation bilaterally.  No accessory muscle use. . Abdomen: Soft, nontender, nondistended. Active bowel sounds. No masses or hepatosplenomegaly  . Skin: No rashes, lesions, or  ulcerations.  Dry, warm to touch. 2+ dorsalis pedis and radial pulses. . Musculoskeletal: No calf or leg pain. All major joints not erythematous nontender.  No upper or lower joint deformation.  Good ROM.  No contractures  . Psychiatric: Intact judgment and insight. Pleasant and cooperative. . Neurologic: No focal neurological deficits. Strength is 5/5 and symmetric in upper and lower extremities.  Cranial nerves II through XII are grossly intact.           Labs on Admission: I have personally reviewed following labs and imaging studies  CBC: Recent Labs  Lab 09/08/18 1820  WBC 5.9  NEUTROABS 3.7  HGB 11.7*  HCT 38.0  MCV 89.0  PLT 917   Basic Metabolic Panel: Recent Labs  Lab 09/08/18 1820  NA 140  K 3.8  CL 108  CO2 21*  GLUCOSE 93  BUN 73*  CREATININE 5.06*  CALCIUM 8.2*  GFR: Estimated Creatinine Clearance: 11.4 mL/min (A) (by C-G formula based on SCr of 5.06 mg/dL (H)). Liver Function Tests: No results for input(s): AST, ALT, ALKPHOS, BILITOT, PROT, ALBUMIN in the last 168 hours. No results for input(s): LIPASE, AMYLASE in the last 168 hours. No results for input(s): AMMONIA in the last 168 hours. Coagulation Profile: No results for input(s): INR, PROTIME in the last 168 hours. Cardiac Enzymes: No results for input(s): CKTOTAL, CKMB, CKMBINDEX, TROPONINI in the last 168 hours. BNP (last 3 results) No results for input(s): PROBNP in the last 8760 hours. HbA1C: No results for input(s): HGBA1C in the last 72 hours. CBG: No results for input(s): GLUCAP in the last 168 hours. Lipid Profile: No results for input(s): CHOL, HDL, LDLCALC, TRIG, CHOLHDL, LDLDIRECT in the last 72 hours. Thyroid Function Tests: No results for input(s): TSH, T4TOTAL, FREET4, T3FREE, THYROIDAB in the last 72 hours. Anemia Panel: No results for input(s): VITAMINB12, FOLATE, FERRITIN, TIBC, IRON, RETICCTPCT in the last 72 hours. Urine analysis:    Component Value Date/Time    COLORURINE YELLOW 01/23/2017 1530   APPEARANCEUR CLEAR 01/23/2017 1530   LABSPEC 1.019 01/23/2017 1530   PHURINE 5.0 01/23/2017 1530   GLUCOSEU NEGATIVE 01/23/2017 1530   HGBUR NEGATIVE 01/23/2017 1530   BILIRUBINUR neg 12/01/2017 1030   KETONESUR NEGATIVE 01/23/2017 1530   PROTEINUR Positive (A) 12/01/2017 1030   PROTEINUR >=300 (A) 01/23/2017 1530   UROBILINOGEN 0.2 12/01/2017 1030   UROBILINOGEN 0.2 07/03/2014 1130   NITRITE neg 12/01/2017 1030   NITRITE NEGATIVE 01/23/2017 1530   LEUKOCYTESUR Negative 12/01/2017 1030   Sepsis Labs: @LABRCNTIP (procalcitonin:4,lacticidven:4) )No results found for this or any previous visit (from the past 240 hour(s)).   Radiological Exams on Admission: Ct Head Wo Contrast  Result Date: 09/08/2018 CLINICAL DATA:  Severe headache for 2 days. Hypertension. EXAM: CT HEAD WITHOUT CONTRAST TECHNIQUE: Contiguous axial images were obtained from the base of the skull through the vertex without intravenous contrast. COMPARISON:  None. FINDINGS: Brain: No evidence for acute infarction, hemorrhage, mass lesion, hydrocephalus, or extra-axial fluid. Normal for age cerebral volume. Extensive hypoattenuation of white matter, consistent with advanced small vessel disease. Vascular: No hyperdense vessel or unexpected calcification. Skull: Normal. Negative for fracture or focal lesion. Sinuses/Orbits: No acute finding. Other: None. IMPRESSION: Advanced small vessel disease. No acute intracranial findings. Electronically Signed   By: Staci Righter M.D.   On: 09/08/2018 19:26    EKG: Independently reviewed.  Sinus rhythm with LVH.  No acute ST changes.  Assessment/Plan: Principal Problem:   Hypertensive emergency Active Problems:   Bipolar disorder (HCC)   Chronic kidney disease, stage IV (severe) ([place])   Noncompliance with medication regimen   Acute renal injury [facility])    This patient was discussed with the ED physician, including pertinent vitals, physical exam  findings, labs, and imaging.  We also discussed care given by the ED provider.  1. Hypertensive emergency a. Admit to stepdown b. Patient will likely need at least 48 hours and in order to correct creatinine and hypertension disorder. c. Start nicardipine drip d. Will titrate her medications to a systolic blood pressure of 160, which is approximately 22% of her systolic blood pressure 2. Acute renal injury a. Start IV fluids b. We will collect fractional excretion of urine c. Recheck creatinine in the morning. 3. Chronic kidney disease a. Uncertain of baseline 4. Noncompliance with medications 5. Bipolar.  DVT prophylaxis: Lovenox Consultants: None Code Status: Full code Family Communication: None Disposition Plan: Patient  should be able to return home tomorrow   Truett Mainland, DO

## 2018-09-08 NOTE — ED Provider Notes (Signed)
[facility] EMERGENCY DEPARTMENT Provider Note   CSN: [id number] Arrival date & time: 09/08/18  1634    History   Chief Complaint Chief Complaint  Patient presents with  . Headache    HPI [name] is a 53 y.o. female with a history of anemia, anxiety, depression, bipolar disorder and has been noncompliant with her HTN medications (last took her coreg, amlodipine and chlorthalidone approx 3 months ago), stating they caused fluid in her ankles, made her feel bad and did not help her bp, so stopped taking, presenting with severe headache along with neck pain and phonophobia which she woke with yesterday morning.  In addition reports nausea with several episodes of vomiting today.  She has had no fevers or chills, she denies abdominal pain, chest pain or shortness of breath and has had no focal weakness or numbness, denies dizziness.  She denies a history of migraine headaches.  She denies any injuries.  She has taken Tylenol prior to arrival with no improvement in her headache symptoms.     The history is provided by the patient.    Past Medical History:  Diagnosis Date  . Anemia   . Anxiety   . Bipolar disorder ([place])   . Depression   . History of blood transfusion   . Hypertension   . IUD    HISTORY OF IUD --REMOVED IN 2006  . Seizures ([place]) [id number]   two seizures due to a med changes    Patient Active Problem List   Diagnosis Date Noted  . Seizure ([place]) 07/06/2017  . Noncompliance 04/27/2017  . Vasovagal syncope 03/29/2017  . LGSIL on Pap smear of cervix 04/26/2016  . Herpes simplex vulvovaginitis 03/01/2016  . Lower extremity edema 07/20/2015  . Chronic kidney disease, stage IV (severe) ([place]) 05/14/2015  . Proteinuria 05/14/2015  . Generalized headaches 01/05/2015  . Weight gain following gastric bypass surgery 08/13/2014  . Deficiency anemia 01/06/2014  . Iron deficiency anemia 01/06/2014  . Vitamin B12 deficiency (dietary) anemia 01/06/2014  .  Essential hypertension, benign 01/25/2013  . Bipolar disorder [facility])     Past Surgical History:  Procedure Laterality Date  . CARPAL TUNNEL RELEASE Right 07/08/2014   Procedure: RIGHT CARPAL TUNNEL RELEASE;  Surgeon: Sanjuana Kava, MD;  Location: [facility];  Service: Orthopedics;  Laterality: Right;  . CHOLECYSTECTOMY    . GASTRIC BYPASS  2000  . HYSTEROSCOPY W/D&C  11/14/2011   Procedure: DILATATION AND CURETTAGE /HYSTEROSCOPY;  Surgeon: Marylynn Pearson, MD;  Location: [place] [facility];  Service: Gynecology;;  with removal of polyps  . INTRAUTERINE DEVICE (IUD) INSERTION  03/21/2010  . LAPAROSCOPIC GASTROTOMY W/ REPAIR OF ULCER    . PANNICULECTOMY    . ROTATOR CUFF REPAIR       OB History    Gravida  1   Para      Term      Preterm      AB  1   Living  0     SAB  1   TAB      Ectopic      Multiple      Live Births               Home Medications    Prior to Admission medications   Medication Sig Start Date End Date Taking? Authorizing Provider  acetaminophen (TYLENOL 8 HOUR) 650 MG CR tablet Take 650 mg by mouth every 8 (eight) hours as needed for pain.   Yes [provider]  ferrous sulfate 325 (65 FE) MG tablet Take 325 mg by mouth 3 (three) times daily with meals.   Yes [provider]  tiZANidine (ZANAFLEX) 4 MG tablet TAKE 1 TABLET BY MOUTH EVERY 12 HOURS AS NEEDED FOR SPASM Patient taking differently: Take 4 mg by mouth every 12 (twelve) hours as needed for muscle spasms.  07/24/18  Yes Sanjuana Kava, MD  traMADol (ULTRAM) 50 MG tablet TAKE 1 TABLET BY MOUTH EVERY 6 HOURS AS NEEDED Patient taking differently: Take 50 mg by mouth every 6 (six) hours as needed for moderate pain. TAKE 1 TABLET BY MOUTH EVERY 6 HOURS AS NEEDED 07/24/18  Yes Sanjuana Kava, MD  valACYclovir (VALTREX) 1000 MG tablet Take 0.5 tablets (500 mg total) by mouth daily. 12/01/17  Yes Shelly Bombard, MD  venlafaxine XR (EFFEXOR-XR) 150 MG 24 hr capsule Take 1 capsule (150 mg total)  by mouth 2 (two) times daily. Needs appointment for future refills. 01/08/18  Yes Lance Sell, NP  vitamin B-12 (CYANOCOBALAMIN) 250 MCG tablet Take 250 mcg by mouth daily.   Yes [provider]    Family History Family History  Problem Relation Age of Onset  . Hypertension Mother   . Kidney failure Mother   . Hypertension Father   . Diabetes Cousin   . Diabetes Sister   . Hypertension Sister   . Kidney disease Sister        dialysis  . Hypertension Brother   . Heart attack Brother   . Hypertension Maternal Aunt   . Hypertension Maternal Uncle   . Hypertension Maternal Grandmother   . Hypertension Maternal Grandfather   . Hypertension Paternal Grandmother   . Hypertension Paternal Grandfather   . Heart attack Brother   . Hypertension Brother   . Hypertension Sister     Social History Social History   Tobacco Use  . Smoking status: Former Smoker    Packs/day: 0.25    Years: 4.00    Pack years: 1.00    Types: Cigarettes    Quit date: 03/21/1988    Years since quitting: 30.4  . Smokeless tobacco: Never Used  Substance Use Topics  . Alcohol use: Yes    Comment: 1-2 times a year.   . Drug use: No     Allergies   Ambien [zolpidem tartrate], Geodon [ziprasidone hcl], Lactulose, Ondansetron, Quetiapine, and Sulfa antibiotics   Review of Systems Review of Systems  Constitutional: Negative for chills and fever.  HENT: Negative for congestion and sore throat.   Eyes: Negative.   Respiratory: Negative for chest tightness and shortness of breath.   Cardiovascular: Negative for chest pain.  Gastrointestinal: Negative for abdominal pain and nausea.  Genitourinary: Negative.   Musculoskeletal: Positive for neck pain. Negative for arthralgias and joint swelling.  Skin: Negative.  Negative for rash and wound.  Neurological: Positive for headaches. Negative for dizziness, speech difficulty, weakness, light-headedness and numbness.  Psychiatric/Behavioral:  Negative.      Physical Exam Updated Vital Signs BP (!) 192/118   Pulse 81   Temp 98.2 F (36.8 C) (Oral)   Resp 16   Ht 4\' 11"  (1.499 m)   Wt 73.9 kg   SpO2 99%   BMI 32.92 kg/m   Physical Exam Vitals signs (Severely hypertensive.) and nursing note reviewed.  Constitutional:      Appearance: She is well-developed.     Comments: Uncomfortable appearing  HENT:     Head: Normocephalic and atraumatic.  Eyes:  Conjunctiva/sclera: Conjunctivae normal.     Pupils: Pupils are equal, round, and reactive to light.  Neck:     Musculoskeletal: Normal range of motion and neck supple.  Cardiovascular:     Rate and Rhythm: Normal rate and regular rhythm.     Heart sounds: Normal heart sounds. No murmur. No friction rub. No gallop.   Pulmonary:     Effort: Pulmonary effort is normal.     Breath sounds: Normal breath sounds. No wheezing.  Abdominal:     General: Bowel sounds are normal.     Palpations: Abdomen is soft.     Tenderness: There is no abdominal tenderness.  Musculoskeletal: Normal range of motion.  Lymphadenopathy:     Cervical: No cervical adenopathy.  Skin:    General: Skin is warm and dry.     Findings: No rash.  Neurological:     Mental Status: She is alert and oriented to person, place, and time.     GCS: GCS eye subscore is 4. GCS verbal subscore is 5. GCS motor subscore is 6.     Sensory: No sensory deficit.     Gait: Gait normal.     Comments: Normal heel-shin, normal rapid alternating movements. Cranial nerves III-XII intact.  No pronator drift.  Psychiatric:        Speech: Speech normal.        Behavior: Behavior normal.        Thought Content: Thought content normal.      ED Treatments / Results  Labs (all labs ordered are listed, but only abnormal results are displayed) Labs Reviewed  CBC WITH DIFFERENTIAL/PLATELET - Abnormal; Notable for the following components:      Result Value   Hemoglobin 11.7 (*)    RDW 17.8 (*)    All other  components within normal limits  BASIC METABOLIC PANEL - Abnormal; Notable for the following components:   CO2 21 (*)    BUN 73 (*)    Creatinine, Ser 5.06 (*)    Calcium 8.2 (*)    GFR calc non Af Amer 9 (*)    GFR calc Af Amer 11 (*)    All other components within normal limits  SARS CORONAVIRUS 2 (HOSPITAL ORDER, [place])    EKG EKG Interpretation  Date/Time:  Saturday September 08 2018 18:36:48 EDT Ventricular Rate:  76 PR Interval:    QRS Duration: 90 QT Interval:  466 QTC Calculation: 524 R Axis:   3 Text Interpretation:  Sinus rhythm Consider left ventricular hypertrophy Prolonged QT interval Confirmed by Fredia Sorrow [phone]) on 09/08/2018 6:41:08 PM   Radiology Ct Head Wo Contrast  Result Date: 09/08/2018 CLINICAL DATA:  Severe headache for 2 days. Hypertension. EXAM: CT HEAD WITHOUT CONTRAST TECHNIQUE: Contiguous axial images were obtained from the base of the skull through the vertex without intravenous contrast. COMPARISON:  None. FINDINGS: Brain: No evidence for acute infarction, hemorrhage, mass lesion, hydrocephalus, or extra-axial fluid. Normal for age cerebral volume. Extensive hypoattenuation of white matter, consistent with advanced small vessel disease. Vascular: No hyperdense vessel or unexpected calcification. Skull: Normal. Negative for fracture or focal lesion. Sinuses/Orbits: No acute finding. Other: None. IMPRESSION: Advanced small vessel disease. No acute intracranial findings. Electronically Signed   By: Staci Righter M.D.   On: 09/08/2018 19:26    Procedures Procedures (including critical care time)  Medications Ordered in ED Medications  chlorthalidone (HYGROTON) tablet 25 mg (has no administration in time range)  nicardipine (CARDENE)  20mg  in 0.86% saline 25ml IV infusion (0.1 mg/ml) (has no administration in time range)  labetalol (NORMODYNE) 500 mg in dextrose 5 % 125 mL (4 mg/mL) infusion (has no administration in  time range)  metoCLOPramide (REGLAN) injection 10 mg (10 mg Intravenous Given 09/08/18 1809)  dexamethasone (DECADRON) injection 10 mg (10 mg Intravenous Given 09/08/18 1808)  diphenhydrAMINE (BENADRYL) injection 25 mg (25 mg Intravenous Given 09/08/18 1810)  labetalol (NORMODYNE) injection 20 mg (20 mg Intravenous Given 09/08/18 1813)  amLODipine (NORVASC) tablet 5 mg (5 mg Oral Given 09/08/18 1954)  morphine 4 MG/ML injection 4 mg (4 mg Intravenous Given 09/08/18 2000)     Initial Impression / Assessment and Plan / ED Course  I have reviewed the triage vital signs and the nursing notes.  Pertinent labs & imaging results that were available during my care of the patient were reviewed by me and considered in my medical decision making (see chart for details).        Labs and CT imaging reviewed and discussed with patient.  Her headache is improved but not resolved after receiving migraine cocktail.  I have added a dose of morphine to help with symptom relief.  She was given labetalol with no significant improvement in her blood pressure. Discussed with Dr. Rogene Houston and amlodipine and chlorthalidone added.  Patient with acute kidney injury in association with hypertensive urgency/emergency.  Pt will need admission for further management of bp.  Discussed with Dr. Nehemiah Settle who accepts pt for admission.  Final Clinical Impressions(s) / ED Diagnoses   Final diagnoses:  Hypertensive urgency, malignant  Acute renal failure superimposed on chronic kidney disease, unspecified CKD stage, unspecified acute renal failure type [facility])    ED Discharge Orders    None       Landis Martins 09/08/18 2020    Fredia Sorrow, MD 09/09/18 1842

## 2018-09-08 NOTE — ED Triage Notes (Addendum)
Pt c/o severe HA x 2 days with n/v. Denies vision changes, hx of migraines, or other neurological deficits at this time. Pt hypertensive 191/101.

## 2018-09-08 NOTE — ED Notes (Signed)
ED Provider at bedside. 

## 2018-09-09 ENCOUNTER — Inpatient Hospital Stay (HOSPITAL_COMMUNITY): Payer: Medicare Other

## 2018-09-09 DIAGNOSIS — N179 Acute kidney failure, unspecified: Secondary | ICD-10-CM

## 2018-09-09 DIAGNOSIS — N189 Chronic kidney disease, unspecified: Secondary | ICD-10-CM

## 2018-09-09 LAB — URINALYSIS, COMPLETE (UACMP) WITH MICROSCOPIC
Bilirubin Urine: NEGATIVE
Glucose, UA: NEGATIVE mg/dL
Ketones, ur: NEGATIVE mg/dL
Leukocytes,Ua: NEGATIVE
Nitrite: NEGATIVE
Protein, ur: >=300 mg/dL — AB
Specific Gravity, Urine: 1.017 (ref 1.005–1.030)
pH: 5 (ref 5.0–8.0)

## 2018-09-09 LAB — RAPID URINE DRUG SCREEN, HOSP PERFORMED
Amphetamines: NOT DETECTED
Barbiturates: NOT DETECTED
Benzodiazepines: NOT DETECTED
Cocaine: NOT DETECTED
Opiates: POSITIVE — AB
Tetrahydrocannabinol: NOT DETECTED

## 2018-09-09 LAB — BASIC METABOLIC PANEL
Anion gap: 11 (ref 5–15)
BUN: 73 mg/dL — ABNORMAL HIGH (ref 6–20)
CO2: 17 mmol/L — ABNORMAL LOW (ref 22–32)
Calcium: 8 mg/dL — ABNORMAL LOW (ref 8.9–10.3)
Chloride: 110 mmol/L (ref 98–111)
Creatinine, Ser: 4.96 mg/dL — ABNORMAL HIGH (ref 0.44–1.00)
GFR calc Af Amer: 11 mL/min — ABNORMAL LOW (ref >60)
GFR calc non Af Amer: 9 mL/min — ABNORMAL LOW (ref >60)
Glucose, Bld: 152 mg/dL — ABNORMAL HIGH (ref 70–99)
Potassium: 4 mmol/L (ref 3.5–5.1)
Sodium: 138 mmol/L (ref 135–145)

## 2018-09-09 LAB — PROTEIN / CREATININE RATIO, URINE
Creatinine, Urine: 147.15 mg/dL
Protein Creatinine Ratio: 3.26 mg/mg{Cre} — ABNORMAL HIGH (ref 0.00–0.15)
Total Protein, Urine: 479 mg/dL

## 2018-09-09 LAB — CREATININE, URINE, RANDOM: Creatinine, Urine: 154.03 mg/dL

## 2018-09-09 LAB — MRSA PCR SCREENING: MRSA by PCR: NEGATIVE

## 2018-09-09 LAB — SODIUM, URINE, RANDOM: Sodium, Ur: 21 mmol/L

## 2018-09-09 MED ORDER — AMLODIPINE BESYLATE 5 MG PO TABS
5.0000 mg | ORAL_TABLET | Freq: Every day | ORAL | Status: DC
  Administered 2018-09-09: 5 mg via ORAL
  Filled 2018-09-09: qty 1

## 2018-09-09 MED ORDER — CARVEDILOL 3.125 MG PO TABS
6.2500 mg | ORAL_TABLET | Freq: Two times a day (BID) | ORAL | Status: DC
  Administered 2018-09-09 (×2): 6.25 mg via ORAL
  Filled 2018-09-09 (×2): qty 2

## 2018-09-09 MED ORDER — CHLORHEXIDINE GLUCONATE CLOTH 2 % EX PADS
6.0000 | MEDICATED_PAD | Freq: Every day | CUTANEOUS | Status: DC
  Administered 2018-09-09: 6 via TOPICAL

## 2018-09-09 MED ORDER — HYDRALAZINE HCL 20 MG/ML IJ SOLN: 5.0000 mg | Freq: Four times a day (QID) | INTRAMUSCULAR | Status: DC | PRN

## 2018-09-09 NOTE — Progress Notes (Signed)
[facility] Per RN 181/108  A/P Hypertension uncontrolled DC cardene order in computer Cont carvedilol 6.25mg  po bid Start Amlodipine 5mg  po qday (as per nephrology recommendation, appreciate input) Start Hydralazine 5mg  iv q6h prn sbp >160

## 2018-09-09 NOTE — Progress Notes (Signed)
PROGRESS NOTE  UNKNOWN FLANNIGAN [facility]:[id number] DOB: 1965-07-25 DOA: 09/08/2018 PCP: Pixie Casino, MD  Brief History:  53 year old female with a history of hypertension, bipolar disorder,Recurrent syncope and possible seizure presenting with headache with associated nausea and vomiting.  The patient developed a headache on 09/07/2018 with worsening on 09/08/2018.  She denies any focal extremity weakness but complained of some blurred vision.  There is no dysarthria or other sensory disturbance.  She denies any NSAID use.  She is only been using acetaminophen.  She denies any chest discomfort, shortness breath, palpitations, syncope, abdominal pain, dysuria, hematuria.  She states that her only new medicine is tramadol and Zanaflex.  She denies any illicit drug use.  Upon presentation, the patient was noted to have blood pressure 215/132.  She was otherwise afebrile with oxygen saturation 98% on room air.  CT of the brain was negative.  She was noted to have a serum creatinine of 5.06.  She was admitted for further evaluation and treatment of her worsening renal function and headache.  Notably, the patient was previously on carvedilol, amlodipine, and chlorthalidone.  She states that she quit taking these medications approximately 3 months ago.  She felt that they were causing leg edema, malaise, and she felt that they were not working as they should.  Assessment/Plan: Acute on chronic renal failure--CKD stage V -Suspect the patient likely has progression of her underlying CKD -Obtain urinalysis -Obtain renal ultrasound -Consult nephrology--spoke with Dr. Hollie Salk -Urine drug screen  Uncontrolled hypertension -In part due to the patient's poor compliance -Restart carvedilol -Wean nicardipine drip -Certainly the patient's Effexor may be contributing  Bipolar disorder -Continue Effexor for now  Anemia of CKD -baseline Hgb~11 -check iron studies  Poor compliance to medical therapy  -Importance of compliance discussed  Lower extremity edema and pain -Venous duplex    Disposition Plan:   Home in 2-3 days  Family Communication:   No Family at bedside  Consultants:  renal  Code Status:  FULL   DVT Prophylaxis:  Shaniko Lovenox   Procedures: As Listed in Progress Note Above  Antibiotics: None       Subjective: Patient states that she has a bifrontal headache once again this morning.  She denies any nausea, vomiting, diarrhea, abdominal pain, dysuria, hematuria.  There is no rashes.  Objective: Vitals:   09/09/18 0400 09/09/18 0500 09/09/18 0625 09/09/18 0700  BP: (!) 137/95 (!) 139/94  (!) 140/100  Pulse: 87 83  78  Resp: 13 12  12   Temp:   98.6 F (37 C)   TempSrc:   Oral   SpO2: 99% 98%  98%  Weight:      Height:        Intake/Output Summary (Last 24 hours) at 09/09/2018 6789 Last data filed at 09/09/2018 0500 Gross per 24 hour  Intake 1031.29 ml  Output -  Net 1031.29 ml   Weight change:  Exam:   General:  Pt is alert, follows commands appropriately, not in acute distress  HEENT: No icterus, No thrush, No neck mass, [place]/AT  Cardiovascular: RRR, S1/S2, no rubs, no gallops  Respiratory: CTA bilaterally, no wheezing, no crackles, no rhonchi  Abdomen: Soft/+BS, non tender, non distended, no guarding  Extremities: Bilateral nonpitting edema, No lymphangitis, No petechiae, No rashes, no synovitis   Data Reviewed: I have personally reviewed following labs and imaging studies Basic Metabolic Panel: Recent Labs  Lab 09/08/18 1820 09/09/18 0348  NA 140 138  K 3.8 4.0  CL 108 110  CO2 21* 17*  GLUCOSE 93 152*  BUN 73* 73*  CREATININE 5.06* 4.96*  CALCIUM 8.2* 8.0*   Liver Function Tests: No results for input(s): AST, ALT, ALKPHOS, BILITOT, PROT, ALBUMIN in the last 168 hours. No results for input(s): LIPASE, AMYLASE in the last 168 hours. No results for input(s): AMMONIA in the last 168 hours. Coagulation Profile: No results  for input(s): INR, PROTIME in the last 168 hours. CBC: Recent Labs  Lab 09/08/18 1820  WBC 5.9  NEUTROABS 3.7  HGB 11.7*  HCT 38.0  MCV 89.0  PLT 199   Cardiac Enzymes: No results for input(s): CKTOTAL, CKMB, CKMBINDEX, TROPONINI in the last 168 hours. BNP: Invalid input(s): POCBNP CBG: No results for input(s): GLUCAP in the last 168 hours. HbA1C: No results for input(s): HGBA1C in the last 72 hours. Urine analysis:    Component Value Date/Time   COLORURINE YELLOW 01/23/2017 1530   APPEARANCEUR CLEAR 01/23/2017 1530   LABSPEC 1.019 01/23/2017 1530   PHURINE 5.0 01/23/2017 1530   GLUCOSEU NEGATIVE 01/23/2017 1530   HGBUR NEGATIVE 01/23/2017 1530   BILIRUBINUR neg 12/01/2017 1030   KETONESUR NEGATIVE 01/23/2017 1530   PROTEINUR Positive (A) 12/01/2017 1030   PROTEINUR >=300 (A) 01/23/2017 1530   UROBILINOGEN 0.2 12/01/2017 1030   UROBILINOGEN 0.2 07/03/2014 1130   NITRITE neg 12/01/2017 1030   NITRITE NEGATIVE 01/23/2017 1530   LEUKOCYTESUR Negative 12/01/2017 1030   Sepsis Labs: @LABRCNTIP (procalcitonin:4,lacticidven:4) ) Recent Results (from the past 240 hour(s))  SARS Coronavirus 2 (CEPHEID - Performed in [place] hospital lab), Hosp Order     Status: None   Collection Time: 09/08/18  7:59 PM   Specimen: Nasopharyngeal Swab  Result Value Ref Range Status   SARS Coronavirus 2 NEGATIVE NEGATIVE Final    Comment: (NOTE) If result is NEGATIVE SARS-CoV-2 target nucleic acids are NOT DETECTED. The SARS-CoV-2 RNA is generally detectable in upper and lower  respiratory specimens during the acute phase of infection. The lowest  concentration of SARS-CoV-2 viral copies this assay can detect is 250  copies / mL. A negative result does not preclude SARS-CoV-2 infection  and should not be used as the sole basis for treatment or other  patient management decisions.  A negative result may occur with  improper specimen collection / handling, submission of specimen other   than nasopharyngeal swab, presence of viral mutation(s) within the  areas targeted by this assay, and inadequate number of viral copies  (<250 copies / mL). A negative result must be combined with clinical  observations, patient history, and epidemiological information. If result is POSITIVE SARS-CoV-2 target nucleic acids are DETECTED. The SARS-CoV-2 RNA is generally detectable in upper and lower  respiratory specimens dur ing the acute phase of infection.  Positive  results are indicative of active infection with SARS-CoV-2.  Clinical  correlation with patient history and other diagnostic information is  necessary to determine patient infection status.  Positive results do  not rule out bacterial infection or co-infection with other viruses. If result is PRESUMPTIVE POSTIVE SARS-CoV-2 nucleic acids MAY BE PRESENT.   A presumptive positive result was obtained on the submitted specimen  and confirmed on repeat testing.  While 2019 novel coronavirus  (SARS-CoV-2) nucleic acids may be present in the submitted sample  additional confirmatory testing may be necessary for epidemiological  and / or clinical management purposes  to differentiate between  SARS-CoV-2 and other Sarbecovirus currently known  to infect humans.  If clinically indicated additional testing with an alternate test  methodology [phone]) is advised. The SARS-CoV-2 RNA is generally  detectable in upper and lower respiratory sp ecimens during the acute  phase of infection. The expected result is Negative. Fact Sheet for Patients:  StrictlyIdeas.no Fact Sheet for Healthcare Providers: BankingDealers.co.za This test is not yet approved or cleared by the [place] FDA and has been authorized for detection and/or diagnosis of SARS-CoV-2 by FDA under an Emergency Use Authorization (EUA).  This EUA will remain in effect (meaning this test can be used) for the duration of the  COVID-19 declaration under Section 564(b)(1) of the Act, 21 U.S.C. section 360bbb-3(b)(1), unless the authorization is terminated or revoked sooner. Performed at [facility], [address] [place], [place] [address]   MRSA PCR Screening     Status: None   Collection Time: 09/08/18  9:40 PM   Specimen: Nasal Mucosa; Nasopharyngeal  Result Value Ref Range Status   MRSA by PCR NEGATIVE NEGATIVE Final    Comment:        The GeneXpert MRSA Assay (FDA approved for NASAL specimens only), is one component of a comprehensive MRSA colonization surveillance program. It is not intended to diagnose MRSA infection nor to guide or monitor treatment for MRSA infections. Performed at [facility], [address], [place], [place] [address]      Scheduled Meds: . aspirin EC  81 mg Oral Daily  . Chlorhexidine Gluconate Cloth  6 each Topical Q0600  . enoxaparin (LOVENOX) injection  30 mg Subcutaneous Q24H  . venlafaxine XR  150 mg Oral BID   Continuous Infusions: . sodium chloride 100 mL/hr at 09/09/18 0718  . sodium chloride    . niCARDipine 3 mg/hr (09/09/18 0500)    Procedures/Studies: Ct Head Wo Contrast  Result Date: 09/08/2018 CLINICAL DATA:  Severe headache for 2 days. Hypertension. EXAM: CT HEAD WITHOUT CONTRAST TECHNIQUE: Contiguous axial images were obtained from the base of the skull through the vertex without intravenous contrast. COMPARISON:  None. FINDINGS: Brain: No evidence for acute infarction, hemorrhage, mass lesion, hydrocephalus, or extra-axial fluid. Normal for age cerebral volume. Extensive hypoattenuation of white matter, consistent with advanced small vessel disease. Vascular: No hyperdense vessel or unexpected calcification. Skull: Normal. Negative for fracture or focal lesion. Sinuses/Orbits: No acute finding. Other: None. IMPRESSION: Advanced small vessel disease. No acute intracranial findings. Electronically Signed   By: Staci Righter M.D.   On: 09/08/2018 19:26     Orson Eva, DO  [facility] Pager [phone]  If 7PM-7AM, please contact night-coverage [email or web address] Password [facility] 09/09/2018, 7:38 AM   LOS: 1 day

## 2018-09-09 NOTE — Progress Notes (Signed)
Patients BP 181/108. Notified Dr. Maudie Mercury. New order to give amlodipine 5mg  and recheck BP in an hour.  Will continue to monitor patient.

## 2018-09-09 NOTE — Consult Note (Signed)
[facility]  HISTORY AND PHYSICAL  [name] is an 53 y.o. female.    Chief Complaint: headache  HPI: Pt is a53F with a PMH sig for CKD Stage IV, HTN, HLD, possible seizure p/w n/v and headache x 2 days.  She has a history of HTN and was supposed to be on coreg, amlodipine, chlorthalidone.  Hasn't taken them in the past 3 months.  Wasn't able to keep anything down and headache was severe. Presented to ED.  Was found to have BP of 215/132.  CT head showing advanced small vessel disease.  Labs showing Na 138. K 4.0, CO2 15, BUN 73, Cr 4.96.  Last known baseline 3.33 12/2017.  Placed on nicardipine gtt. In this setting we are asked to see.    Pt reports that she started taking BP meds in her 64s.  Doesn't remember when BP was controlled.  Notes her BP has been in the 180s/120s at home recently.  She denies NSAID use, no illicit drugs.  Has a FH of ESRD- brother, father, aunts, cousins.  Doesn't recall getting kidney biopsy.    Renal US with no obstruction but small kidneys (8.5 and 8.2 cm) with marked cortical echogenicity.    Reports HA better now without f/c, n/v, SOB, CP.  NO LE edema.  No foamy/ frothy urine.  PMH: Past Medical History:  Diagnosis Date  . Anemia   . Anxiety   . Bipolar disorder ([place])   . Depression   . History of blood transfusion   . Hypertension   . IUD    HISTORY OF IUD --REMOVED IN 2006  . Seizures ([place]) [id number]   two seizures due to a med changes   PSH: Past Surgical History:  Procedure Laterality Date  . CARPAL TUNNEL RELEASE Right 07/08/2014   Procedure: RIGHT CARPAL TUNNEL RELEASE;  Surgeon: Sanjuana Kava, MD;  Location: [facility];  Service: Orthopedics;  Laterality: Right;  . CHOLECYSTECTOMY    . GASTRIC BYPASS  2000  . HYSTEROSCOPY W/D&C  11/14/2011   Procedure: DILATATION AND CURETTAGE /HYSTEROSCOPY;  Surgeon: Marylynn Pearson, MD;  Location: [facility];  Service: Gynecology;;  with removal of polyps  . INTRAUTERINE DEVICE (IUD)  INSERTION  03/21/2010  . LAPAROSCOPIC GASTROTOMY W/ REPAIR OF ULCER    . PANNICULECTOMY    . ROTATOR CUFF REPAIR      Past Medical History:  Diagnosis Date  . Anemia   . Anxiety   . Bipolar disorder ([place])   . Depression   . History of blood transfusion   . Hypertension   . IUD    HISTORY OF IUD --REMOVED IN 2006  . Seizures ([place]) [id number]   two seizures due to a med changes    Medications:   Scheduled: . aspirin EC  81 mg Oral Daily  . carvedilol  6.25 mg Oral BID WC  . Chlorhexidine Gluconate Cloth  6 each Topical Q0600  . enoxaparin (LOVENOX) injection  30 mg Subcutaneous Q24H  . venlafaxine XR  150 mg Oral BID    Medications Prior to Admission  Medication Sig Dispense Refill  . acetaminophen (TYLENOL 8 HOUR) 650 MG CR tablet Take 650 mg by mouth every 8 (eight) hours as needed for pain.    . ferrous sulfate 325 (65 FE) MG tablet Take 325 mg by mouth 3 (three) times daily with meals.    Marland Kitchen tiZANidine (ZANAFLEX) 4 MG tablet TAKE 1 TABLET BY MOUTH EVERY 12 HOURS AS NEEDED FOR SPASM (Patient  taking differently: Take 4 mg by mouth every 12 (twelve) hours as needed for muscle spasms. ) 60 tablet 4  . traMADol (ULTRAM) 50 MG tablet TAKE 1 TABLET BY MOUTH EVERY 6 HOURS AS NEEDED (Patient taking differently: Take 50 mg by mouth every 6 (six) hours as needed for moderate pain. TAKE 1 TABLET BY MOUTH EVERY 6 HOURS AS NEEDED) 90 tablet 3  . valACYclovir (VALTREX) 1000 MG tablet Take 0.5 tablets (500 mg total) by mouth daily. 30 tablet 5  . venlafaxine XR (EFFEXOR-XR) 150 MG 24 hr capsule Take 1 capsule (150 mg total) by mouth 2 (two) times daily. Needs appointment for future refills. 180 capsule 0  . vitamin B-12 (CYANOCOBALAMIN) 250 MCG tablet Take 250 mcg by mouth daily.      ALLERGIES:   Allergies  Allergen Reactions  . Ambien [Zolpidem Tartrate]     "BLACK OUT", SLEEP DRIVING  . Geodon [Ziprasidone Hcl]     Seizure activity  . Lactulose Itching  . Ondansetron Nausea And  Vomiting  . Quetiapine     Other reaction(s): Other (See Comments) Possible Hair Loss  . Sulfa Antibiotics Rash    FAM HX: Family History  Problem Relation Age of Onset  . Hypertension Mother   . Kidney failure Mother   . Hypertension Father   . Diabetes Cousin   . Diabetes Sister   . Hypertension Sister   . Kidney disease Sister        dialysis  . Hypertension Brother   . Heart attack Brother   . Hypertension Maternal Aunt   . Hypertension Maternal Uncle   . Hypertension Maternal Grandmother   . Hypertension Maternal Grandfather   . Hypertension Paternal Grandmother   . Hypertension Paternal Grandfather   . Heart attack Brother   . Hypertension Brother   . Hypertension Sister     Social History:   reports that she quit smoking about 30 years ago. Her smoking use included cigarettes. She has a 1.00 pack-year smoking history. She has never used smokeless tobacco. She reports current alcohol use. She reports that she does not use drugs.  ROS: ROS: all other systems reviewed and are negative except as per HPI  Blood pressure (!) 153/97, pulse 95, temperature 98.3 F (36.8 C), temperature source Oral, resp. rate (!) 21, height 4\' 11"  (1.499 m), weight 73.9 kg, SpO2 100 %. PHYSICAL EXAM: Physical Exam  GEN NAD, sitting in bed, sleeping and arousable HEENT EOMI PERRL NECK no JVD PULM normal WOB, clear bilaterally no c/w/r CV RRR loud S2 ABD soft, NABS, no bruits heard EXT no LE edema NEURO AAO x 3 SKIN warm and dry MSK no effusions   Results for orders placed or performed during the hospital encounter of 09/08/18 (from the past 48 hour(s))  CBC with Differential     Status: Abnormal   Collection Time: 09/08/18  6:20 PM  Result Value Ref Range   WBC 5.9 4.0 - 10.5 K/uL   RBC 4.27 3.87 - 5.11 MIL/uL   Hemoglobin 11.7 (L) 12.0 - 15.0 g/dL   HCT 38.0 36.0 - 46.0 %   MCV 89.0 80.0 - 100.0 fL   MCH 27.4 26.0 - 34.0 pg   MCHC 30.8 30.0 - 36.0 g/dL   RDW 17.8 (H) 11.5  - 15.5 %   Platelets 199 150 - 400 K/uL   nRBC 0.0 0.0 - 0.2 %   Neutrophils Relative % 66 %   Neutro Abs 3.7 1.7 - 7.7 K/uL  Lymphocytes Relative 26 %   Lymphs Abs 1.5 0.7 - 4.0 K/uL   Monocytes Relative 8 %   Monocytes Absolute 0.5 0.1 - 1.0 K/uL   Eosinophils Relative 0 %   Eosinophils Absolute 0.0 0.0 - 0.5 K/uL   Basophils Relative 0 %   Basophils Absolute 0.0 0.0 - 0.1 K/uL   Immature Granulocytes 0 %   Abs Immature Granulocytes 0.02 0.00 - 0.07 K/uL    Comment: Performed at [facility], [address] [place], [place] [address]  Basic metabolic panel     Status: Abnormal   Collection Time: 09/08/18  6:20 PM  Result Value Ref Range   Sodium 140 135 - 145 mmol/L   Potassium 3.8 3.5 - 5.1 mmol/L   Chloride 108 98 - 111 mmol/L   CO2 21 (L) 22 - 32 mmol/L   Glucose, Bld 93 70 - 99 mg/dL   BUN 73 (H) 6 - 20 mg/dL   Creatinine, Ser 5.06 (H) 0.44 - 1.00 mg/dL   Calcium 8.2 (L) 8.9 - 10.3 mg/dL   GFR calc non Af Amer 9 (L) >60 mL/min   GFR calc Af Amer 11 (L) >60 mL/min   Anion gap 11 5 - 15    Comment: Performed at [facility], [address], [place], [place] [address]  SARS Coronavirus 2 (CEPHEID - Performed in [place] hospital lab), Hosp Order     Status: None   Collection Time: 09/08/18  7:59 PM   Specimen: Nasopharyngeal Swab  Result Value Ref Range   SARS Coronavirus 2 NEGATIVE NEGATIVE    Comment: (NOTE) If result is NEGATIVE SARS-CoV-2 target nucleic acids are NOT DETECTED. The SARS-CoV-2 RNA is generally detectable in upper and lower  respiratory specimens during the acute phase of infection. The lowest  concentration of SARS-CoV-2 viral copies this assay can detect is 250  copies / mL. A negative result does not preclude SARS-CoV-2 infection  and should not be used as the sole basis for treatment or other  patient management decisions.  A negative result may occur with  improper specimen collection / handling, submission of specimen other  than  nasopharyngeal swab, presence of viral mutation(s) within the  areas targeted by this assay, and inadequate number of viral copies  (<250 copies / mL). A negative result must be combined with clinical  observations, patient history, and epidemiological information. If result is POSITIVE SARS-CoV-2 target nucleic acids are DETECTED. The SARS-CoV-2 RNA is generally detectable in upper and lower  respiratory specimens dur ing the acute phase of infection.  Positive  results are indicative of active infection with SARS-CoV-2.  Clinical  correlation with patient history and other diagnostic information is  necessary to determine patient infection status.  Positive results do  not rule out bacterial infection or co-infection with other viruses. If result is PRESUMPTIVE POSTIVE SARS-CoV-2 nucleic acids MAY BE PRESENT.   A presumptive positive result was obtained on the submitted specimen  and confirmed on repeat testing.  While 2019 novel coronavirus  (SARS-CoV-2) nucleic acids may be present in the submitted sample  additional confirmatory testing may be necessary for epidemiological  and / or clinical management purposes  to differentiate between  SARS-CoV-2 and other Sarbecovirus currently known to infect humans.  If clinically indicated additional testing with an alternate test  methodology [phone]) is advised. The SARS-CoV-2 RNA is generally  detectable in upper and lower respiratory sp ecimens during the acute  phase of infection. The expected result is  Negative. Fact Sheet for Patients:  StrictlyIdeas.no Fact Sheet for Healthcare Providers: BankingDealers.co.za This test is not yet approved or cleared by the [place] FDA and has been authorized for detection and/or diagnosis of SARS-CoV-2 by FDA under an Emergency Use Authorization (EUA).  This EUA will remain in effect (meaning this test can be used) for the duration of  the COVID-19 declaration under Section 564(b)(1) of the Act, 21 U.S.C. section 360bbb-3(b)(1), unless the authorization is terminated or revoked sooner. Performed at [facility], [address], [place], [place] [address]   MRSA PCR Screening     Status: None   Collection Time: 09/08/18  9:40 PM   Specimen: Nasal Mucosa; Nasopharyngeal  Result Value Ref Range   MRSA by PCR NEGATIVE NEGATIVE    Comment:        The GeneXpert MRSA Assay (FDA approved for NASAL specimens only), is one component of a comprehensive MRSA colonization surveillance program. It is not intended to diagnose MRSA infection nor to guide or monitor treatment for MRSA infections. Performed at [facility], [address] [place], [place] [address]   Basic metabolic panel     Status: Abnormal   Collection Time: 09/09/18  3:48 AM  Result Value Ref Range   Sodium 138 135 - 145 mmol/L   Potassium 4.0 3.5 - 5.1 mmol/L   Chloride 110 98 - 111 mmol/L   CO2 17 (L) 22 - 32 mmol/L   Glucose, Bld 152 (H) 70 - 99 mg/dL   BUN 73 (H) 6 - 20 mg/dL   Creatinine, Ser 4.96 (H) 0.44 - 1.00 mg/dL   Calcium 8.0 (L) 8.9 - 10.3 mg/dL   GFR calc non Af Amer 9 (L) >60 mL/min   GFR calc Af Amer 11 (L) >60 mL/min   Anion gap 11 5 - 15    Comment: Performed at [facility], [address] [place], [place] [address]  Sodium, urine, random     Status: None   Collection Time: 09/09/18  8:15 AM  Result Value Ref Range   Sodium, Ur 21 mmol/L    Comment: Performed at [facility], [address] [place], [place] [address]  Creatinine, urine, random     Status: None   Collection Time: 09/09/18  8:15 AM  Result Value Ref Range   Creatinine, Urine 154.03 mg/dL    Comment: Performed at [facility], [address], [place], [place] [address]  Urinalysis, Complete w Microscopic     Status: Abnormal   Collection Time: 09/09/18  8:15 AM  Result Value Ref Range   Color, Urine YELLOW YELLOW   APPearance HAZY (A) CLEAR   Specific Gravity,  Urine 1.017 1.005 - 1.030   pH 5.0 5.0 - 8.0   Glucose, UA NEGATIVE NEGATIVE mg/dL   Hgb urine dipstick SMALL (A) NEGATIVE   Bilirubin Urine NEGATIVE NEGATIVE   Ketones, ur NEGATIVE NEGATIVE mg/dL   Protein, ur >=300 (A) NEGATIVE mg/dL   Nitrite NEGATIVE NEGATIVE   Leukocytes,Ua NEGATIVE NEGATIVE   RBC / HPF 0-5 0 - 5 RBC/hpf   WBC, UA 0-5 0 - 5 WBC/hpf   Bacteria, UA RARE (A) NONE SEEN   Squamous Epithelial / LPF 0-5 0 - 5   Mucus PRESENT     Comment: Performed at [facility], [address], [place], [place] [address]  Urine rapid drug screen (hosp performed)     Status: Abnormal   Collection Time: 09/09/18  8:15 AM  Result Value Ref Range  Opiates POSITIVE (A) NONE DETECTED   Cocaine NONE DETECTED NONE DETECTED   Benzodiazepines NONE DETECTED NONE DETECTED   Amphetamines NONE DETECTED NONE DETECTED   Tetrahydrocannabinol NONE DETECTED NONE DETECTED   Barbiturates NONE DETECTED NONE DETECTED    Comment: (NOTE) DRUG SCREEN FOR MEDICAL PURPOSES ONLY.  IF CONFIRMATION IS NEEDED FOR ANY PURPOSE, NOTIFY LAB WITHIN 5 DAYS. LOWEST DETECTABLE LIMITS FOR URINE DRUG SCREEN Drug Class                     Cutoff (ng/mL) Amphetamine and metabolites    1000 Barbiturate and metabolites    200 Benzodiazepine                 449 Tricyclics and metabolites     300 Opiates and metabolites        300 Cocaine and metabolites        300 THC                            50 Performed at [facility], [address], , [place] [address]     Ct Head Wo Contrast  Result Date: 09/08/2018 CLINICAL DATA:  Severe headache for 2 days. Hypertension. EXAM: CT HEAD WITHOUT CONTRAST TECHNIQUE: Contiguous axial images were obtained from the base of the skull through the vertex without intravenous contrast. COMPARISON:  None. FINDINGS: Brain: No evidence for acute infarction, hemorrhage, mass lesion, hydrocephalus, or extra-axial fluid. Normal for age cerebral volume. Extensive hypoattenuation of white  matter, consistent with advanced small vessel disease. Vascular: No hyperdense vessel or unexpected calcification. Skull: Normal. Negative for fracture or focal lesion. Sinuses/Orbits: No acute finding. Other: None. IMPRESSION: Advanced small vessel disease. No acute intracranial findings. Electronically Signed   By: Staci Righter M.D.   On: 09/08/2018 19:26   US Renal  Result Date: 09/09/2018 CLINICAL DATA:  Acute renal insufficiency.  Hypertension. EXAM: RENAL / URINARY TRACT ULTRASOUND COMPLETE COMPARISON:  CT 01/23/2017 FINDINGS: Right Kidney: Renal measurements: 8.9 x 4.4 x 6.0 cm = volume: 124 mL. Significant increased renal cortical echogenicity. Couple small cysts with the larger measuring 1.2 cm. No hydronephrosis. Left Kidney: Renal measurements: 8.5 x 5.1 x 4.6 cm = volume: 103 mL. Increased cortical echogenicity. 1.4 cm cyst over the upper pole. No hydronephrosis. Bladder: Appears normal for degree of bladder distention. IMPRESSION: Kidneys at the lower limits of normal in size with moderate increased cortical echogenicity compatible with medical renal disease. No hydronephrosis. Small bilateral renal cysts. Electronically Signed   By: Marin Olp M.D.   On: 09/09/2018 13:20    Assessment/Plan 1.  Hypertensive emergency: in the setting of not taking meds x 3 months.  Off nicardipine gtt and on carvedilol and amlodipine.  Would maximize doses of those meds before other agents.  Would do hydralazine next if needed. Will do renal duplex, ARR to start--> doesn't appear that her BP has been well-controlled for some time now.  2.  AKI on CKD IV: baseline 3.3--> 4.96.  Likely in setting of hypertensive emergency, unclear if any reversibility.  Renal US with marked cortical echogenicity.  Will do UP/C.  May have familial FSGS based on mult family members on HD but biopsy would likely not yield any useful result at present.  3.  Anemia: mild, monitor  4.  H/o bipolar disorder: on  venlafaxine  Davene Jobin 09/09/2018, 2:49 PM

## 2018-09-10 ENCOUNTER — Inpatient Hospital Stay (HOSPITAL_COMMUNITY): Payer: Medicare Other

## 2018-09-10 DIAGNOSIS — N179 Acute kidney failure, unspecified: Secondary | ICD-10-CM

## 2018-09-10 DIAGNOSIS — I160 Hypertensive urgency: Secondary | ICD-10-CM

## 2018-09-10 DIAGNOSIS — R600 Localized edema: Secondary | ICD-10-CM

## 2018-09-10 DIAGNOSIS — N184 Chronic kidney disease, stage 4 (severe): Secondary | ICD-10-CM

## 2018-09-10 LAB — IRON AND TIBC
Iron: 46 ug/dL (ref 28–170)
Saturation Ratios: 16 % (ref 10.4–31.8)
TIBC: 283 ug/dL (ref 250–450)
UIBC: 237 ug/dL

## 2018-09-10 LAB — RENAL FUNCTION PANEL
Albumin: 3.2 g/dL — ABNORMAL LOW (ref 3.5–5.0)
Anion gap: 9 (ref 5–15)
BUN: 69 mg/dL — ABNORMAL HIGH (ref 6–20)
CO2: 18 mmol/L — ABNORMAL LOW (ref 22–32)
Calcium: 7.3 mg/dL — ABNORMAL LOW (ref 8.9–10.3)
Chloride: 113 mmol/L — ABNORMAL HIGH (ref 98–111)
Creatinine, Ser: 4.85 mg/dL — ABNORMAL HIGH (ref 0.44–1.00)
GFR calc Af Amer: 11 mL/min — ABNORMAL LOW (ref >60)
GFR calc non Af Amer: 10 mL/min — ABNORMAL LOW (ref >60)
Glucose, Bld: 90 mg/dL (ref 70–99)
Phosphorus: 5.2 mg/dL — ABNORMAL HIGH (ref 2.5–4.6)
Potassium: 4.1 mmol/L (ref 3.5–5.1)
Sodium: 140 mmol/L (ref 135–145)

## 2018-09-10 LAB — PLATELET FUNCTION ASSAY
Collagen / ADP: 95 seconds (ref 0–118)
Collagen / Epinephrine: 215 seconds — ABNORMAL HIGH (ref 0–193)

## 2018-09-10 LAB — FERRITIN: Ferritin: 23 ng/mL (ref 11–307)

## 2018-09-10 MED ORDER — FUROSEMIDE 80 MG PO TABS
80.0000 mg | ORAL_TABLET | Freq: Every day | ORAL | Status: DC
  Administered 2018-09-10 – 2018-09-11 (×2): 80 mg via ORAL
  Filled 2018-09-10 (×2): qty 1

## 2018-09-10 MED ORDER — DIPHENHYDRAMINE HCL 50 MG/ML IJ SOLN
25.0000 mg | Freq: Once | INTRAMUSCULAR | Status: AC
  Administered 2018-09-10: 25 mg via INTRAVENOUS
  Filled 2018-09-10: qty 1

## 2018-09-10 MED ORDER — SODIUM BICARBONATE 650 MG PO TABS
1300.0000 mg | ORAL_TABLET | Freq: Two times a day (BID) | ORAL | Status: DC
  Administered 2018-09-10 – 2018-09-11 (×3): 1300 mg via ORAL
  Filled 2018-09-10 (×3): qty 2

## 2018-09-10 MED ORDER — AMLODIPINE BESYLATE 5 MG PO TABS
10.0000 mg | ORAL_TABLET | Freq: Every day | ORAL | Status: DC
  Administered 2018-09-10: 10 mg via ORAL
  Filled 2018-09-10: qty 2

## 2018-09-10 MED ORDER — PROCHLORPERAZINE EDISYLATE 10 MG/2ML IJ SOLN
5.0000 mg | Freq: Once | INTRAMUSCULAR | Status: AC
  Administered 2018-09-10: 5 mg via INTRAVENOUS
  Filled 2018-09-10: qty 2

## 2018-09-10 MED ORDER — CARVEDILOL 12.5 MG PO TABS
12.5000 mg | ORAL_TABLET | Freq: Two times a day (BID) | ORAL | Status: DC
  Administered 2018-09-10 – 2018-09-11 (×3): 12.5 mg via ORAL
  Filled 2018-09-10 (×3): qty 1

## 2018-09-10 NOTE — Progress Notes (Signed)
Subjective: Interval History: has no complaint, wants to go home.  Objective: Vital signs in last 24 hours: Temp:  [97.7 F (36.5 C)-98.4 F (36.9 C)] 98.4 F (36.9 C) (06/22 0529) Pulse Rate:  [76-101] 81 (06/22 0529) Resp:  [14-21] 18 (06/22 0529) BP: (127-181)/(81-117) 161/98 (06/22 0529) SpO2:  [97 %-100 %] 100 % (06/22 0529) Weight change:   Intake/Output from previous day: 06/21 0701 - 06/22 0700 In: 1599.9 [P.O.:420; I.V.:1179.9] Out: 1800 [Urine:1800] Intake/Output this shift: No intake/output data recorded.  General appearance: alert, cooperative and mildly obese Resp: clear to auscultation bilaterally Cardio: S1, S2 normal and systolic murmur: holosystolic 2/6, blowing at apex GI: obese, pos bs, soft Extremities: edema tr  Lab Results: Recent Labs    09/08/18 1820  WBC 5.9  HGB 11.7*  HCT 38.0  PLT 199   BMET:  Recent Labs    09/09/18 0348 09/10/18 0530  NA 138 140  K 4.0 4.1  CL 110 113*  CO2 17* 18*  GLUCOSE 152* 90  BUN 73* 69*  CREATININE 4.96* 4.85*  CALCIUM 8.0* 7.3*   No results for input(s): PTH in the last 72 hours. Iron Studies:  Recent Labs    09/10/18 0530  IRON 46  TIBC 283  FERRITIN 23    Studies/Results: Ct Head Wo Contrast  Result Date: 09/08/2018 CLINICAL DATA:  Severe headache for 2 days. Hypertension. EXAM: CT HEAD WITHOUT CONTRAST TECHNIQUE: Contiguous axial images were obtained from the base of the skull through the vertex without intravenous contrast. COMPARISON:  None. FINDINGS: Brain: No evidence for acute infarction, hemorrhage, mass lesion, hydrocephalus, or extra-axial fluid. Normal for age cerebral volume. Extensive hypoattenuation of white matter, consistent with advanced small vessel disease. Vascular: No hyperdense vessel or unexpected calcification. Skull: Normal. Negative for fracture or focal lesion. Sinuses/Orbits: No acute finding. Other: None. IMPRESSION: Advanced small vessel disease. No acute intracranial  findings. Electronically Signed   By: Staci Righter M.D.   On: 09/08/2018 19:26   US Renal  Result Date: 09/09/2018 CLINICAL DATA:  Acute renal insufficiency.  Hypertension. EXAM: RENAL / URINARY TRACT ULTRASOUND COMPLETE COMPARISON:  CT 01/23/2017 FINDINGS: Right Kidney: Renal measurements: 8.9 x 4.4 x 6.0 cm = volume: 124 mL. Significant increased renal cortical echogenicity. Couple small cysts with the larger measuring 1.2 cm. No hydronephrosis. Left Kidney: Renal measurements: 8.5 x 5.1 x 4.6 cm = volume: 103 mL. Increased cortical echogenicity. 1.4 cm cyst over the upper pole. No hydronephrosis. Bladder: Appears normal for degree of bladder distention. IMPRESSION: Kidneys at the lower limits of normal in size with moderate increased cortical echogenicity compatible with medical renal disease. No hydronephrosis. Small bilateral renal cysts. Electronically Signed   By: Marin Olp M.D.   On: 09/09/2018 13:20   US Venous Img Lower Bilateral  Result Date: 09/09/2018 CLINICAL DATA:  Bilateral lower extremity edema. EXAM: BILATERAL LOWER EXTREMITY VENOUS DOPPLER ULTRASOUND TECHNIQUE: Gray-scale sonography with graded compression, as well as color Doppler and duplex ultrasound were performed to evaluate the lower extremity deep venous systems from the level of the common femoral vein and including the common femoral, femoral, profunda femoral, popliteal and calf veins including the posterior tibial, peroneal and gastrocnemius veins when visible. The superficial great saphenous vein was also interrogated. Spectral Doppler was utilized to evaluate flow at rest and with distal augmentation maneuvers in the common femoral, femoral and popliteal veins. COMPARISON:  None. FINDINGS: RIGHT LOWER EXTREMITY Common Femoral Vein: No evidence of thrombus. Normal compressibility, respiratory phasicity and  response to augmentation. Saphenofemoral Junction: No evidence of thrombus. Normal compressibility and flow on color  Doppler imaging. Profunda Femoral Vein: No evidence of thrombus. Normal compressibility and flow on color Doppler imaging. Femoral Vein: No evidence of thrombus. Normal compressibility, respiratory phasicity and response to augmentation. Popliteal Vein: No evidence of thrombus. Normal compressibility, respiratory phasicity and response to augmentation. Calf Veins: Visualized right deep calf veins are patent without thrombus. Superficial Great Saphenous Vein: No evidence of thrombus. Normal compressibility. Venous Reflux:  None. Other Findings:  None. LEFT LOWER EXTREMITY Common Femoral Vein: No evidence of thrombus. Normal compressibility, respiratory phasicity and response to augmentation. Saphenofemoral Junction: No evidence of thrombus. Normal compressibility and flow on color Doppler imaging. Profunda Femoral Vein: No evidence of thrombus. Normal compressibility and flow on color Doppler imaging. Femoral Vein: No evidence of thrombus. Normal compressibility, respiratory phasicity and response to augmentation. Popliteal Vein: No evidence of thrombus. Normal compressibility, respiratory phasicity and response to augmentation. Calf Veins: Visualized left deep calf veins are patent without thrombus. Superficial Great Saphenous Vein: No evidence of thrombus. Normal compressibility. Venous Reflux:  None. Other Findings:  None. IMPRESSION: No evidence of deep venous thrombosis in either lower extremity. Electronically Signed   By: Markus Daft M.D.   On: 09/09/2018 16:00    I have reviewed the patient's current medications.  Assessment/Plan: 1 CKD4, ,mild AKI from uncontrolled bp, but suspect progression of CKD primary issue. Needs bicarb 2 HTN coming down . Will ^ meds, add diuretic 3 RTA 4 Anemia will give iv Fe 5 HPTH check P bicarb, Lasix, ^  bp meds., Fe, PTH,  Can d/c anytime and f/u outpatient with Dr. Hollie Salk.    LOS: 2 days   Jeneen Rinks Shante Archambeault 09/10/2018,7:39 AM

## 2018-09-10 NOTE — Care Management Important Message (Signed)
Important Message  Patient Details  Name: [name] MRN: [id number] Date of Birth: May 16, 1965   Medicare Important Message Given:  Yes     Tommy Medal 09/10/2018, 3:02 PM

## 2018-09-10 NOTE — Progress Notes (Signed)
PROGRESS NOTE  MARYALYCE SANJUAN ZPH:[id number] DOB: [birth year] DOA: 09/08/2018 PCP: Pixie Casino, MD  Brief History:  53 year old female with a history of hypertension, bipolar disorder,Recurrent syncope and possible seizurepresenting with headache with associated nausea and vomiting. The patient developed a headache on 09/07/2018 with worsening on 09/08/2018. She denies any focal extremity weakness but complained of some blurred vision. There is no dysarthria or other sensory disturbance. She denies any NSAID use. She is only been using acetaminophen. She denies any chest discomfort, shortness breath, palpitations, syncope, abdominal pain, dysuria, hematuria. She states that her only new medicine is tramadol and Zanaflex. She denies any illicit drug use. Upon presentation, the patient was noted to have blood pressure 215/132. She was otherwise afebrile with oxygen saturation 98% on room air. CT of the brain was negative. She was noted to have a serum creatinine of 5.06. She was admitted for further evaluation and treatment of her worsening renal function and headache. Notably, the patient was previously on carvedilol, amlodipine, and chlorthalidone. She states that she quit taking these medications approximately 3 months ago. She felt that they were causing leg edema, malaise, and she felt that they were not working as they should.  Assessment/Plan: Acute on chronic renal failure--CKD stage IV -Suspect the patient likely has progression of her underlying CKD -Obtain urinalysis--bland -Urine P/C--3.26 grams protein -Obtain renal ultrasound--neg hydronephrosis, moderate echogenicity -Consult nephrology--spoke with Dr. Hollie Salk -Urine drug screen--neg -appreciate renal--follow up with Dr. Hollie Salk  Uncontrolled hypertension -In part due to the patient's poor compliance -Restart carvedilol--titrate up dosing -Weaned nicardipine drip off -restarted amlo -Certainly the  patient's Effexor may be contributing  Intractable headache -6/20 CT brain--neg -now with visual disturbance-->MRI brain -likely related to uncontrolled HTN, complicated migrain -IV benadryl/compazine  Bipolar disorder -Continue Effexor for now  Anemia of CKD -baseline Hgb~11  Poor compliance to medical therapy -Importance of compliance discussed  Lower extremity edema and pain -Venous duplex--neg       Disposition Plan:   Home 6/23 if stable and headache better controlled  Family Communication:  No  Family at bedside  Consultants:  renal  Code Status:  FULL  DVT Prophylaxis:  [place] Heparin    Procedures: As Listed in Progress Note Above  Antibiotics: None      Subjective: Pt complains of bifrontal headache R>L with some visual distubance.  Denies focal extremity weakness. Complains nausea, no vomiting.  No f/c, cp, sob, abd pain, dysuria  Objective: Vitals:   09/10/18 0019 09/10/18 0529 09/10/18 0834 09/10/18 1002  BP: (!) 173/99 (!) 161/98 (!) 162/103 134/89  Pulse: 81 81 86 85  Resp:  18    Temp:  98.4 F (36.9 C)    TempSrc:  Oral    SpO2:  100%  100%  Weight:      Height:        Intake/Output Summary (Last 24 hours) at 09/10/2018 1141 Last data filed at 09/10/2018 0600 Gross per 24 hour  Intake 1150.25 ml  Output 1500 ml  Net -349.75 ml   Weight change:  Exam:   General:  Pt is alert, follows commands appropriately, not in acute distress  HEENT: No icterus, No thrush, No neck mass, [place]/AT  Cardiovascular: RRR, S1/S2, no rubs, no gallops  Respiratory: CTA bilaterally, no wheezing, no crackles, no rhonchi  Abdomen: Soft/+BS, non tender, non distended, no guarding  Extremities: trace LE edema, No lymphangitis, No petechiae, No rashes, no synovitis  Neuro:  CN II-XII intact, strength 4/5 in RUE, RLE, strength 4/5 LUE, LLE; sensation intact bilateral; no dysmetria; babinski equivocal     Data Reviewed: I have personally reviewed  following labs and imaging studies Basic Metabolic Panel: Recent Labs  Lab 09/08/18 1820 09/09/18 0348 09/10/18 0530  NA 140 138 140  K 3.8 4.0 4.1  CL 108 110 113*  CO2 21* 17* 18*  GLUCOSE 93 152* 90  BUN 73* 73* 69*  CREATININE 5.06* 4.96* 4.85*  CALCIUM 8.2* 8.0* 7.3*  PHOS  --   --  5.2*   Liver Function Tests: Recent Labs  Lab 09/10/18 0530  ALBUMIN 3.2*   No results for input(s): LIPASE, AMYLASE in the last 168 hours. No results for input(s): AMMONIA in the last 168 hours. Coagulation Profile: No results for input(s): INR, PROTIME in the last 168 hours. CBC: Recent Labs  Lab 09/08/18 1820  WBC 5.9  NEUTROABS 3.7  HGB 11.7*  HCT 38.0  MCV 89.0  PLT 199   Cardiac Enzymes: No results for input(s): CKTOTAL, CKMB, CKMBINDEX, TROPONINI in the last 168 hours. BNP: Invalid input(s): POCBNP CBG: No results for input(s): GLUCAP in the last 168 hours. HbA1C: No results for input(s): HGBA1C in the last 72 hours. Urine analysis:    Component Value Date/Time   COLORURINE YELLOW 09/09/2018 0815   APPEARANCEUR HAZY (A) 09/09/2018 0815   LABSPEC 1.017 09/09/2018 0815   PHURINE 5.0 09/09/2018 0815   GLUCOSEU NEGATIVE 09/09/2018 0815   HGBUR SMALL (A) 09/09/2018 0815   BILIRUBINUR NEGATIVE 09/09/2018 0815   BILIRUBINUR neg 12/01/2017 1030   KETONESUR NEGATIVE 09/09/2018 0815   PROTEINUR >=300 (A) 09/09/2018 0815   UROBILINOGEN 0.2 12/01/2017 1030   UROBILINOGEN 0.2 07/03/2014 1130   NITRITE NEGATIVE 09/09/2018 0815   LEUKOCYTESUR NEGATIVE 09/09/2018 0815   Sepsis Labs: @LABRCNTIP (procalcitonin:4,lacticidven:4) ) Recent Results (from the past 240 hour(s))  SARS Coronavirus 2 (CEPHEID - Performed in [place] hospital lab), Hosp Order     Status: None   Collection Time: 09/08/18  7:59 PM   Specimen: Nasopharyngeal Swab  Result Value Ref Range Status   SARS Coronavirus 2 NEGATIVE NEGATIVE Final    Comment: (NOTE) If result is NEGATIVE SARS-CoV-2 target  nucleic acids are NOT DETECTED. The SARS-CoV-2 RNA is generally detectable in upper and lower  respiratory specimens during the acute phase of infection. The lowest  concentration of SARS-CoV-2 viral copies this assay can detect is 250  copies / mL. A negative result does not preclude SARS-CoV-2 infection  and should not be used as the sole basis for treatment or other  patient management decisions.  A negative result may occur with  improper specimen collection / handling, submission of specimen other  than nasopharyngeal swab, presence of viral mutation(s) within the  areas targeted by this assay, and inadequate number of viral copies  (<250 copies / mL). A negative result must be combined with clinical  observations, patient history, and epidemiological information. If result is POSITIVE SARS-CoV-2 target nucleic acids are DETECTED. The SARS-CoV-2 RNA is generally detectable in upper and lower  respiratory specimens dur ing the acute phase of infection.  Positive  results are indicative of active infection with SARS-CoV-2.  Clinical  correlation with patient history and other diagnostic information is  necessary to determine patient infection status.  Positive results do  not rule out bacterial infection or co-infection with other viruses. If result is PRESUMPTIVE POSTIVE SARS-CoV-2 nucleic acids MAY BE PRESENT.   A  presumptive positive result was obtained on the submitted specimen  and confirmed on repeat testing.  While 2019 novel coronavirus  (SARS-CoV-2) nucleic acids may be present in the submitted sample  additional confirmatory testing may be necessary for epidemiological  and / or clinical management purposes  to differentiate between  SARS-CoV-2 and other Sarbecovirus currently known to infect humans.  If clinically indicated additional testing with an alternate test  methodology [phone]) is advised. The SARS-CoV-2 RNA is generally  detectable in upper and lower  respiratory sp ecimens during the acute  phase of infection. The expected result is Negative. Fact Sheet for Patients:  StrictlyIdeas.no Fact Sheet for Healthcare Providers: BankingDealers.co.za This test is not yet approved or cleared by the [place] FDA and has been authorized for detection and/or diagnosis of SARS-CoV-2 by FDA under an Emergency Use Authorization (EUA).  This EUA will remain in effect (meaning this test can be used) for the duration of the COVID-19 declaration under Section 564(b)(1) of the Act, 21 U.S.C. section 360bbb-3(b)(1), unless the authorization is terminated or revoked sooner. Performed at [facility], [address], [place], [place] [address]   MRSA PCR Screening     Status: None   Collection Time: 09/08/18  9:40 PM   Specimen: Nasal Mucosa; Nasopharyngeal  Result Value Ref Range Status   MRSA by PCR NEGATIVE NEGATIVE Final    Comment:        The GeneXpert MRSA Assay (FDA approved for NASAL specimens only), is one component of a comprehensive MRSA colonization surveillance program. It is not intended to diagnose MRSA infection nor to guide or monitor treatment for MRSA infections. Performed at [facility], [address], [place], [place] [address]      Scheduled Meds: . amLODipine  10 mg Oral QHS  . aspirin EC  81 mg Oral Daily  . carvedilol  12.5 mg Oral BID WC  . diphenhydrAMINE  25 mg Intravenous Once  . enoxaparin (LOVENOX) injection  30 mg Subcutaneous Q24H  . furosemide  80 mg Oral Daily  . prochlorperazine  5 mg Intravenous Once  . sodium bicarbonate  1,300 mg Oral BID  . venlafaxine XR  150 mg Oral BID   Continuous Infusions:  Procedures/Studies: Ct Head Wo Contrast  Result Date: 09/08/2018 CLINICAL DATA:  Severe headache for 2 days. Hypertension. EXAM: CT HEAD WITHOUT CONTRAST TECHNIQUE: Contiguous axial images were obtained from the base of the skull through the vertex  without intravenous contrast. COMPARISON:  None. FINDINGS: Brain: No evidence for acute infarction, hemorrhage, mass lesion, hydrocephalus, or extra-axial fluid. Normal for age cerebral volume. Extensive hypoattenuation of white matter, consistent with advanced small vessel disease. Vascular: No hyperdense vessel or unexpected calcification. Skull: Normal. Negative for fracture or focal lesion. Sinuses/Orbits: No acute finding. Other: None. IMPRESSION: Advanced small vessel disease. No acute intracranial findings. Electronically Signed   By: Staci Righter M.D.   On: 09/08/2018 19:26   US Renal  Result Date: 09/09/2018 CLINICAL DATA:  Acute renal insufficiency.  Hypertension. EXAM: RENAL / URINARY TRACT ULTRASOUND COMPLETE COMPARISON:  CT 01/23/2017 FINDINGS: Right Kidney: Renal measurements: 8.9 x 4.4 x 6.0 cm = volume: 124 mL. Significant increased renal cortical echogenicity. Couple small cysts with the larger measuring 1.2 cm. No hydronephrosis. Left Kidney: Renal measurements: 8.5 x 5.1 x 4.6 cm = volume: 103 mL. Increased cortical echogenicity. 1.4 cm cyst over the upper pole. No hydronephrosis. Bladder: Appears normal for degree of bladder distention. IMPRESSION: Kidneys at the lower limits of  normal in size with moderate increased cortical echogenicity compatible with medical renal disease. No hydronephrosis. Small bilateral renal cysts. Electronically Signed   By: Marin Olp M.D.   On: 09/09/2018 13:20   US Venous Img Lower Bilateral  Result Date: 09/09/2018 CLINICAL DATA:  Bilateral lower extremity edema. EXAM: BILATERAL LOWER EXTREMITY VENOUS DOPPLER ULTRASOUND TECHNIQUE: Gray-scale sonography with graded compression, as well as color Doppler and duplex ultrasound were performed to evaluate the lower extremity deep venous systems from the level of the common femoral vein and including the common femoral, femoral, profunda femoral, popliteal and calf veins including the posterior tibial, peroneal  and gastrocnemius veins when visible. The superficial great saphenous vein was also interrogated. Spectral Doppler was utilized to evaluate flow at rest and with distal augmentation maneuvers in the common femoral, femoral and popliteal veins. COMPARISON:  None. FINDINGS: RIGHT LOWER EXTREMITY Common Femoral Vein: No evidence of thrombus. Normal compressibility, respiratory phasicity and response to augmentation. Saphenofemoral Junction: No evidence of thrombus. Normal compressibility and flow on color Doppler imaging. Profunda Femoral Vein: No evidence of thrombus. Normal compressibility and flow on color Doppler imaging. Femoral Vein: No evidence of thrombus. Normal compressibility, respiratory phasicity and response to augmentation. Popliteal Vein: No evidence of thrombus. Normal compressibility, respiratory phasicity and response to augmentation. Calf Veins: Visualized right deep calf veins are patent without thrombus. Superficial Great Saphenous Vein: No evidence of thrombus. Normal compressibility. Venous Reflux:  None. Other Findings:  None. LEFT LOWER EXTREMITY Common Femoral Vein: No evidence of thrombus. Normal compressibility, respiratory phasicity and response to augmentation. Saphenofemoral Junction: No evidence of thrombus. Normal compressibility and flow on color Doppler imaging. Profunda Femoral Vein: No evidence of thrombus. Normal compressibility and flow on color Doppler imaging. Femoral Vein: No evidence of thrombus. Normal compressibility, respiratory phasicity and response to augmentation. Popliteal Vein: No evidence of thrombus. Normal compressibility, respiratory phasicity and response to augmentation. Calf Veins: Visualized left deep calf veins are patent without thrombus. Superficial Great Saphenous Vein: No evidence of thrombus. Normal compressibility. Venous Reflux:  None. Other Findings:  None. IMPRESSION: No evidence of deep venous thrombosis in either lower extremity. Electronically  Signed   By: Markus Daft M.D.   On: 09/09/2018 16:00    Orson Eva, DO  [facility] Pager [phone]  If 7PM-7AM, please contact night-coverage [email or web address] Password [facility] 09/10/2018, 11:41 AM   LOS: 2 days

## 2018-09-10 NOTE — Discharge Summary (Signed)
Physician Discharge Summary  [name] UXL:[id number] DOB: October 02, 1965 DOA: 09/08/2018  PCP: Pixie Casino, MD  Admit date: 09/08/2018 Discharge date: 09/11/2018  Admitted From:Home Disposition:  Home / SNF  Recommendations for Outpatient Follow-up:  1. Follow up with PCP in 1-2 weeks 2. Please obtain BMP/CBC in one week     Discharge Condition: Stable CODE STATUS: FULL Diet recommendation: Heart Healthy    Brief/Interim Summary: 53 year old female with a history of hypertension, bipolar disorder,Recurrent syncope and possible seizure presenting with headache with associated nausea and vomiting.  The patient developed a headache on 09/07/2018 with worsening on 09/08/2018.  She denies any focal extremity weakness but complained of some blurred vision.  There is no dysarthria or other sensory disturbance.  She denies any NSAID use.  She is only been using acetaminophen.  She denies any chest discomfort, shortness breath, palpitations, syncope, abdominal pain, dysuria, hematuria.  She states that her only new medicine is tramadol and Zanaflex.  She denies any illicit drug use.  Upon presentation, the patient was noted to have blood pressure 215/132.  She was otherwise afebrile with oxygen saturation 98% on room air.  CT of the brain was negative.  She was noted to have a serum creatinine of 5.06.  She was admitted for further evaluation and treatment of her worsening renal function and headache.  Notably, the patient was previously on carvedilol, amlodipine, and chlorthalidone.  She states that she quit taking these medications approximately 3 months ago.  She felt that they were causing leg edema, malaise, and she felt that they were not working as they should.  Nephrology was consulted to assist with management.  Renal artery duplex was ordered, but this was deferred to the outpt setting as the pt wanted to be discharged prior to it being performed.  Discharge Diagnoses:  Acute on  chronic renal failure--CKD stage IV -Suspect the patient likely has progression of her underlying CKD -Obtain urinalysis--bland -Urine P/C--3.26 grams protein -Obtain renal ultrasound--neg hydronephrosis, moderate echogenicity -Consult nephrology--spoke with Dr. Hollie Salk -Urine drug screen--neg -appreciate renal--follow up with Dr. Hollie Salk -bicarb and iron added by renal  Uncontrolled hypertension -In part due to the patient's poor compliance -Restart carvedilol--titrate up dosing -Weaned nicardipine drip off -restarted amlo -coreg started -lasix added -Certainly the patient's Effexor may be contributing  Intractable headache/Posterior reversible encephalopathy syndrome (PRES) -6/20 CT brain--neg -now with visual disturbance-->MRI brain -6/22 MR Brain--Extensive confluent BILATERAL subcortical white matter signalabnormality  Bipolar disorder -Continue Effexor for now  Anemia of CKD -baseline Hgb~11 -started on po iron   Poor compliance to medical therapy -Importance of compliance discussed  Lower extremity edema and pain -Venous duplex--neg   Discharge Instructions   Allergies as of 09/11/2018      Reactions   Ambien [zolpidem Tartrate]    "BLACK OUT", SLEEP DRIVING   Geodon [ziprasidone Hcl]    Seizure activity   Lactulose Itching   Ondansetron Nausea And Vomiting   Quetiapine    Other reaction(s): Other (See Comments) Possible Hair Loss   Sulfa Antibiotics Rash      Medication List    TAKE these medications   amLODipine 10 MG tablet Commonly known as: NORVASC Take 1 tablet (10 mg total) by mouth at bedtime.   carvedilol 12.5 MG tablet Commonly known as: COREG Take 1 tablet (12.5 mg total) by mouth 2 (two) times daily with a meal.   ferrous sulfate 325 (65 FE) MG tablet Take 325 mg by mouth 3 (three) times daily  with meals.   furosemide 80 MG tablet Commonly known as: LASIX Take 1 tablet (80 mg total) by mouth daily. Start taking on: September 12, 2018   sodium bicarbonate 650 MG tablet Take 2 tablets (1,300 mg total) by mouth 2 (two) times daily.   tiZANidine 4 MG tablet Commonly known as: ZANAFLEX TAKE 1 TABLET BY MOUTH EVERY 12 HOURS AS NEEDED FOR SPASM What changed:   how much to take  how to take this  when to take this  reasons to take this  additional instructions   traMADol 50 MG tablet Commonly known as: ULTRAM TAKE 1 TABLET BY MOUTH EVERY 6 HOURS AS NEEDED What changed:   reasons to take this  additional instructions   Tylenol 8 Hour 650 MG CR tablet Generic drug: acetaminophen Take 650 mg by mouth every 8 (eight) hours as needed for pain.   valACYclovir 1000 MG tablet Commonly known as: VALTREX Take 0.5 tablets (500 mg total) by mouth daily.   venlafaxine XR 150 MG 24 hr capsule Commonly known as: EFFEXOR-XR Take 1 capsule (150 mg total) by mouth 2 (two) times daily. Needs appointment for future refills.   vitamin B-12 250 MCG tablet Commonly known as: CYANOCOBALAMIN Take 250 mcg by mouth daily.       Allergies  Allergen Reactions   Ambien [Zolpidem Tartrate]     "BLACK OUT", SLEEP DRIVING   Geodon [Ziprasidone Hcl]     Seizure activity   Lactulose Itching   Ondansetron Nausea And Vomiting   Quetiapine     Other reaction(s): Other (See Comments) Possible Hair Loss   Sulfa Antibiotics Rash    Consultations:  none   Procedures/Studies: Ct Head Wo Contrast  Result Date: 09/08/2018 CLINICAL DATA:  Severe headache for 2 days. Hypertension. EXAM: CT HEAD WITHOUT CONTRAST TECHNIQUE: Contiguous axial images were obtained from the base of the skull through the vertex without intravenous contrast. COMPARISON:  None. FINDINGS: Brain: No evidence for acute infarction, hemorrhage, mass lesion, hydrocephalus, or extra-axial fluid. Normal for age cerebral volume. Extensive hypoattenuation of white matter, consistent with advanced small vessel disease. Vascular: No hyperdense vessel or  unexpected calcification. Skull: Normal. Negative for fracture or focal lesion. Sinuses/Orbits: No acute finding. Other: None. IMPRESSION: Advanced small vessel disease. No acute intracranial findings. Electronically Signed   By: Staci Righter M.D.   On: 09/08/2018 19:26   Mr Brain Wo Contrast  Result Date: 09/10/2018 CLINICAL DATA:  Headache. Hypertensive emergency. Chronic renal failure. EXAM: MRI HEAD WITHOUT CONTRAST TECHNIQUE: Multiplanar, multiecho pulse sequences of the brain and surrounding structures were obtained without intravenous contrast. COMPARISON:  CT head 09/08/2018. FINDINGS: Brain: No evidence for acute infarction, hemorrhage, mass lesion, hydrocephalus, or extra-axial fluid. Normal cerebral volume. Extensive, premature for age, T2 and FLAIR hyperintensities in the periventricular and subcortical white matter, of uncertain etiology, almost certainly a component of which is likely to represent small vessel disease. In addition however, confluent subcortical white matter signal abnormality, parieto-occipital predominant but also involving the frontal and insular regions, concerning for PRES. No restricted diffusion. No cortical involvement. No definite brainstem or cerebellar involvement. Vascular: Flow voids are maintained throughout the carotid, basilar, and vertebral arteries. There are no areas of chronic hemorrhage. Skull and upper cervical spine: Normal marrow signal. Sinuses/Orbits: No sinus disease.  Negative orbits. Other: None. IMPRESSION: Extensive confluent BILATERAL subcortical white matter signal abnormality in this patient with hypertensive emergency could represent a manifestation of PRES. No restricted diffusion or cortical involvement. Underlying chronic  microvascular ischemic changes are likely also present, but there is no acute stroke. Electronically Signed   By: Staci Righter M.D.   On: 09/10/2018 15:27   US Renal  Result Date: 09/09/2018 CLINICAL DATA:  Acute renal  insufficiency.  Hypertension. EXAM: RENAL / URINARY TRACT ULTRASOUND COMPLETE COMPARISON:  CT 01/23/2017 FINDINGS: Right Kidney: Renal measurements: 8.9 x 4.4 x 6.0 cm = volume: 124 mL. Significant increased renal cortical echogenicity. Couple small cysts with the larger measuring 1.2 cm. No hydronephrosis. Left Kidney: Renal measurements: 8.5 x 5.1 x 4.6 cm = volume: 103 mL. Increased cortical echogenicity. 1.4 cm cyst over the upper pole. No hydronephrosis. Bladder: Appears normal for degree of bladder distention. IMPRESSION: Kidneys at the lower limits of normal in size with moderate increased cortical echogenicity compatible with medical renal disease. No hydronephrosis. Small bilateral renal cysts. Electronically Signed   By: Marin Olp M.D.   On: 09/09/2018 13:20   US Venous Img Lower Bilateral  Result Date: 09/09/2018 CLINICAL DATA:  Bilateral lower extremity edema. EXAM: BILATERAL LOWER EXTREMITY VENOUS DOPPLER ULTRASOUND TECHNIQUE: Gray-scale sonography with graded compression, as well as color Doppler and duplex ultrasound were performed to evaluate the lower extremity deep venous systems from the level of the common femoral vein and including the common femoral, femoral, profunda femoral, popliteal and calf veins including the posterior tibial, peroneal and gastrocnemius veins when visible. The superficial great saphenous vein was also interrogated. Spectral Doppler was utilized to evaluate flow at rest and with distal augmentation maneuvers in the common femoral, femoral and popliteal veins. COMPARISON:  None. FINDINGS: RIGHT LOWER EXTREMITY Common Femoral Vein: No evidence of thrombus. Normal compressibility, respiratory phasicity and response to augmentation. Saphenofemoral Junction: No evidence of thrombus. Normal compressibility and flow on color Doppler imaging. Profunda Femoral Vein: No evidence of thrombus. Normal compressibility and flow on color Doppler imaging. Femoral Vein: No evidence  of thrombus. Normal compressibility, respiratory phasicity and response to augmentation. Popliteal Vein: No evidence of thrombus. Normal compressibility, respiratory phasicity and response to augmentation. Calf Veins: Visualized right deep calf veins are patent without thrombus. Superficial Great Saphenous Vein: No evidence of thrombus. Normal compressibility. Venous Reflux:  None. Other Findings:  None. LEFT LOWER EXTREMITY Common Femoral Vein: No evidence of thrombus. Normal compressibility, respiratory phasicity and response to augmentation. Saphenofemoral Junction: No evidence of thrombus. Normal compressibility and flow on color Doppler imaging. Profunda Femoral Vein: No evidence of thrombus. Normal compressibility and flow on color Doppler imaging. Femoral Vein: No evidence of thrombus. Normal compressibility, respiratory phasicity and response to augmentation. Popliteal Vein: No evidence of thrombus. Normal compressibility, respiratory phasicity and response to augmentation. Calf Veins: Visualized left deep calf veins are patent without thrombus. Superficial Great Saphenous Vein: No evidence of thrombus. Normal compressibility. Venous Reflux:  None. Other Findings:  None. IMPRESSION: No evidence of deep venous thrombosis in either lower extremity. Electronically Signed   By: Markus Daft M.D.   On: 09/09/2018 16:00        Discharge Exam: Vitals:   09/10/18 2135 09/11/18 0547  BP: (!) 151/100 138/88  Pulse: 73 77  Resp: 16 14  Temp: 97.9 F (36.6 C) 97.9 F (36.6 C)  SpO2: 100% 99%   Vitals:   09/10/18 1654 09/10/18 2019 09/10/18 2135 09/11/18 0547  BP: (!) 154/101  (!) 151/100 138/88  Pulse: 77  73 77  Resp: 16  16 14   Temp: 98 F (36.7 C)  97.9 F (36.6 C) 97.9 F (36.6 C)  TempSrc:  Oral  Oral Oral  SpO2: 95% 97% 100% 99%  Weight:      Height:        General: Pt is alert, awake, not in acute distress Cardiovascular: RRR, S1/S2 +, no rubs, no gallops Respiratory: CTA  bilaterally, no wheezing, no rhonchi Abdominal: Soft, NT, ND, bowel sounds + Extremities: no edema, no cyanosis   The results of significant diagnostics from this hospitalization (including imaging, microbiology, ancillary and laboratory) are listed below for reference.    Significant Diagnostic Studies: Ct Head Wo Contrast  Result Date: 09/08/2018 CLINICAL DATA:  Severe headache for 2 days. Hypertension. EXAM: CT HEAD WITHOUT CONTRAST TECHNIQUE: Contiguous axial images were obtained from the base of the skull through the vertex without intravenous contrast. COMPARISON:  None. FINDINGS: Brain: No evidence for acute infarction, hemorrhage, mass lesion, hydrocephalus, or extra-axial fluid. Normal for age cerebral volume. Extensive hypoattenuation of white matter, consistent with advanced small vessel disease. Vascular: No hyperdense vessel or unexpected calcification. Skull: Normal. Negative for fracture or focal lesion. Sinuses/Orbits: No acute finding. Other: None. IMPRESSION: Advanced small vessel disease. No acute intracranial findings. Electronically Signed   By: Staci Righter M.D.   On: 09/08/2018 19:26   Mr Brain Wo Contrast  Result Date: 09/10/2018 CLINICAL DATA:  Headache. Hypertensive emergency. Chronic renal failure. EXAM: MRI HEAD WITHOUT CONTRAST TECHNIQUE: Multiplanar, multiecho pulse sequences of the brain and surrounding structures were obtained without intravenous contrast. COMPARISON:  CT head 09/08/2018. FINDINGS: Brain: No evidence for acute infarction, hemorrhage, mass lesion, hydrocephalus, or extra-axial fluid. Normal cerebral volume. Extensive, premature for age, T2 and FLAIR hyperintensities in the periventricular and subcortical white matter, of uncertain etiology, almost certainly a component of which is likely to represent small vessel disease. In addition however, confluent subcortical white matter signal abnormality, parieto-occipital predominant but also involving the  frontal and insular regions, concerning for PRES. No restricted diffusion. No cortical involvement. No definite brainstem or cerebellar involvement. Vascular: Flow voids are maintained throughout the carotid, basilar, and vertebral arteries. There are no areas of chronic hemorrhage. Skull and upper cervical spine: Normal marrow signal. Sinuses/Orbits: No sinus disease.  Negative orbits. Other: None. IMPRESSION: Extensive confluent BILATERAL subcortical white matter signal abnormality in this patient with hypertensive emergency could represent a manifestation of PRES. No restricted diffusion or cortical involvement. Underlying chronic microvascular ischemic changes are likely also present, but there is no acute stroke. Electronically Signed   By: Staci Righter M.D.   On: 09/10/2018 15:27   US Renal  Result Date: 09/09/2018 CLINICAL DATA:  Acute renal insufficiency.  Hypertension. EXAM: RENAL / URINARY TRACT ULTRASOUND COMPLETE COMPARISON:  CT 01/23/2017 FINDINGS: Right Kidney: Renal measurements: 8.9 x 4.4 x 6.0 cm = volume: 124 mL. Significant increased renal cortical echogenicity. Couple small cysts with the larger measuring 1.2 cm. No hydronephrosis. Left Kidney: Renal measurements: 8.5 x 5.1 x 4.6 cm = volume: 103 mL. Increased cortical echogenicity. 1.4 cm cyst over the upper pole. No hydronephrosis. Bladder: Appears normal for degree of bladder distention. IMPRESSION: Kidneys at the lower limits of normal in size with moderate increased cortical echogenicity compatible with medical renal disease. No hydronephrosis. Small bilateral renal cysts. Electronically Signed   By: Marin Olp M.D.   On: 09/09/2018 13:20   US Venous Img Lower Bilateral  Result Date: 09/09/2018 CLINICAL DATA:  Bilateral lower extremity edema. EXAM: BILATERAL LOWER EXTREMITY VENOUS DOPPLER ULTRASOUND TECHNIQUE: Gray-scale sonography with graded compression, as well as color Doppler and duplex ultrasound were performed to  evaluate  the lower extremity deep venous systems from the level of the common femoral vein and including the common femoral, femoral, profunda femoral, popliteal and calf veins including the posterior tibial, peroneal and gastrocnemius veins when visible. The superficial great saphenous vein was also interrogated. Spectral Doppler was utilized to evaluate flow at rest and with distal augmentation maneuvers in the common femoral, femoral and popliteal veins. COMPARISON:  None. FINDINGS: RIGHT LOWER EXTREMITY Common Femoral Vein: No evidence of thrombus. Normal compressibility, respiratory phasicity and response to augmentation. Saphenofemoral Junction: No evidence of thrombus. Normal compressibility and flow on color Doppler imaging. Profunda Femoral Vein: No evidence of thrombus. Normal compressibility and flow on color Doppler imaging. Femoral Vein: No evidence of thrombus. Normal compressibility, respiratory phasicity and response to augmentation. Popliteal Vein: No evidence of thrombus. Normal compressibility, respiratory phasicity and response to augmentation. Calf Veins: Visualized right deep calf veins are patent without thrombus. Superficial Great Saphenous Vein: No evidence of thrombus. Normal compressibility. Venous Reflux:  None. Other Findings:  None. LEFT LOWER EXTREMITY Common Femoral Vein: No evidence of thrombus. Normal compressibility, respiratory phasicity and response to augmentation. Saphenofemoral Junction: No evidence of thrombus. Normal compressibility and flow on color Doppler imaging. Profunda Femoral Vein: No evidence of thrombus. Normal compressibility and flow on color Doppler imaging. Femoral Vein: No evidence of thrombus. Normal compressibility, respiratory phasicity and response to augmentation. Popliteal Vein: No evidence of thrombus. Normal compressibility, respiratory phasicity and response to augmentation. Calf Veins: Visualized left deep calf veins are patent without thrombus. Superficial  Great Saphenous Vein: No evidence of thrombus. Normal compressibility. Venous Reflux:  None. Other Findings:  None. IMPRESSION: No evidence of deep venous thrombosis in either lower extremity. Electronically Signed   By: Markus Daft M.D.   On: 09/09/2018 16:00     Microbiology: Recent Results (from the past 240 hour(s))  SARS Coronavirus 2 (CEPHEID - Performed in [place] hospital lab), Hosp Order     Status: None   Collection Time: 09/08/18  7:59 PM   Specimen: Nasopharyngeal Swab  Result Value Ref Range Status   SARS Coronavirus 2 NEGATIVE NEGATIVE Final    Comment: (NOTE) If result is NEGATIVE SARS-CoV-2 target nucleic acids are NOT DETECTED. The SARS-CoV-2 RNA is generally detectable in upper and lower  respiratory specimens during the acute phase of infection. The lowest  concentration of SARS-CoV-2 viral copies this assay can detect is 250  copies / mL. A negative result does not preclude SARS-CoV-2 infection  and should not be used as the sole basis for treatment or other  patient management decisions.  A negative result may occur with  improper specimen collection / handling, submission of specimen other  than nasopharyngeal swab, presence of viral mutation(s) within the  areas targeted by this assay, and inadequate number of viral copies  (<250 copies / mL). A negative result must be combined with clinical  observations, patient history, and epidemiological information. If result is POSITIVE SARS-CoV-2 target nucleic acids are DETECTED. The SARS-CoV-2 RNA is generally detectable in upper and lower  respiratory specimens dur ing the acute phase of infection.  Positive  results are indicative of active infection with SARS-CoV-2.  Clinical  correlation with patient history and other diagnostic information is  necessary to determine patient infection status.  Positive results do  not rule out bacterial infection or co-infection with other viruses. If result is PRESUMPTIVE  POSTIVE SARS-CoV-2 nucleic acids MAY BE PRESENT.   A presumptive positive result was obtained on the  submitted specimen  and confirmed on repeat testing.  While 2019 novel coronavirus  (SARS-CoV-2) nucleic acids may be present in the submitted sample  additional confirmatory testing may be necessary for epidemiological  and / or clinical management purposes  to differentiate between  SARS-CoV-2 and other Sarbecovirus currently known to infect humans.  If clinically indicated additional testing with an alternate test  methodology [phone]) is advised. The SARS-CoV-2 RNA is generally  detectable in upper and lower respiratory sp ecimens during the acute  phase of infection. The expected result is Negative. Fact Sheet for Patients:  StrictlyIdeas.no Fact Sheet for Healthcare Providers: BankingDealers.co.za This test is not yet approved or cleared by the [place] FDA and has been authorized for detection and/or diagnosis of SARS-CoV-2 by FDA under an Emergency Use Authorization (EUA).  This EUA will remain in effect (meaning this test can be used) for the duration of the COVID-19 declaration under Section 564(b)(1) of the Act, 21 U.S.C. section 360bbb-3(b)(1), unless the authorization is terminated or revoked sooner. Performed at [facility], [address], [place], [place] [address]   MRSA PCR Screening     Status: None   Collection Time: 09/08/18  9:40 PM   Specimen: Nasal Mucosa; Nasopharyngeal  Result Value Ref Range Status   MRSA by PCR NEGATIVE NEGATIVE Final    Comment:        The GeneXpert MRSA Assay (FDA approved for NASAL specimens only), is one component of a comprehensive MRSA colonization surveillance program. It is not intended to diagnose MRSA infection nor to guide or monitor treatment for MRSA infections. Performed at [facility], [address] [place], [place] [address]      Labs: Basic Metabolic  Panel: Recent Labs  Lab 09/08/18 1820 09/09/18 0348 09/10/18 0530 09/11/18 0506  NA 140 138 140 142  K 3.8 4.0 4.1 3.9  CL 108 110 113* 113*  CO2 21* 17* 18* 21*  GLUCOSE 93 152* 90 77  BUN 73* 73* 69* 74*  CREATININE 5.06* 4.96* 4.85* 4.79*  CALCIUM 8.2* 8.0* 7.3* 7.0*  PHOS  --   --  5.2* 5.3*   Liver Function Tests: Recent Labs  Lab 09/10/18 0530 09/11/18 0506  ALBUMIN 3.2* 3.1*   No results for input(s): LIPASE, AMYLASE in the last 168 hours. No results for input(s): AMMONIA in the last 168 hours. CBC: Recent Labs  Lab 09/08/18 1820  WBC 5.9  NEUTROABS 3.7  HGB 11.7*  HCT 38.0  MCV 89.0  PLT 199   Cardiac Enzymes: No results for input(s): CKTOTAL, CKMB, CKMBINDEX, TROPONINI in the last 168 hours. BNP: Invalid input(s): POCBNP CBG: No results for input(s): GLUCAP in the last 168 hours.  Time coordinating discharge:  36 minutes  Signed:  Orson Eva, DO [facility] Pager: [phone] 09/11/2018, 11:07 AM

## 2018-09-11 LAB — RENAL FUNCTION PANEL
Albumin: 3.1 g/dL — ABNORMAL LOW (ref 3.5–5.0)
Anion gap: 8 (ref 5–15)
BUN: 74 mg/dL — ABNORMAL HIGH (ref 6–20)
CO2: 21 mmol/L — ABNORMAL LOW (ref 22–32)
Calcium: 7 mg/dL — ABNORMAL LOW (ref 8.9–10.3)
Chloride: 113 mmol/L — ABNORMAL HIGH (ref 98–111)
Creatinine, Ser: 4.79 mg/dL — ABNORMAL HIGH (ref 0.44–1.00)
GFR calc Af Amer: 11 mL/min — ABNORMAL LOW (ref >60)
GFR calc non Af Amer: 10 mL/min — ABNORMAL LOW (ref >60)
Glucose, Bld: 77 mg/dL (ref 70–99)
Phosphorus: 5.3 mg/dL — ABNORMAL HIGH (ref 2.5–4.6)
Potassium: 3.9 mmol/L (ref 3.5–5.1)
Sodium: 142 mmol/L (ref 135–145)

## 2018-09-11 LAB — PARATHYROID HORMONE, INTACT (NO CA): PTH: 29 pg/mL (ref 15–65)

## 2018-09-11 MED ORDER — SODIUM CHLORIDE 0.9 % IV SOLN
510.0000 mg | Freq: Once | INTRAVENOUS | Status: AC
  Administered 2018-09-11: 510 mg via INTRAVENOUS
  Filled 2018-09-11: qty 17

## 2018-09-11 MED ORDER — AMLODIPINE BESYLATE 10 MG PO TABS: 10.0000 mg | ORAL_TABLET | Freq: Every day | ORAL | 1 refills | Status: DC

## 2018-09-11 MED ORDER — SODIUM BICARBONATE 650 MG PO TABS: 1300.0000 mg | ORAL_TABLET | Freq: Two times a day (BID) | ORAL | 1 refills | Status: DC

## 2018-09-11 MED ORDER — FUROSEMIDE 80 MG PO TABS: 80.0000 mg | ORAL_TABLET | Freq: Every day | ORAL | 1 refills | Status: DC

## 2018-09-11 MED ORDER — CARVEDILOL 12.5 MG PO TABS: 12.5000 mg | ORAL_TABLET | Freq: Two times a day (BID) | ORAL | 1 refills | Status: DC

## 2018-09-11 NOTE — Progress Notes (Signed)
Subjective: Interval History: has no complaint .  Objective: Vital signs in last 24 hours: Temp:  [97.9 F (36.6 C)-98 F (36.7 C)] 97.9 F (36.6 C) (06/23 0547) Pulse Rate:  [73-86] 77 (06/23 0547) Resp:  [14-16] 14 (06/23 0547) BP: (134-162)/(88-103) 138/88 (06/23 0547) SpO2:  [95 %-100 %] 99 % (06/23 0547) Weight change:   Intake/Output from previous day: 06/22 0701 - 06/23 0700 In: 480 [P.O.:480] Out: 1400 [Urine:1400] Intake/Output this shift: No intake/output data recorded.  General appearance: alert, cooperative, no distress and moderately obese Resp: clear to auscultation bilaterally Cardio: S1, S2 normal and systolic murmur: holosystolic 2/6, blowing at apex GI: obese, pos bs, soft Extremities: edema tr  Lab Results: Recent Labs    09/08/18 1820  WBC 5.9  HGB 11.7*  HCT 38.0  PLT 199   BMET:  Recent Labs    09/09/18 0348 09/10/18 0530  NA 138 140  K 4.0 4.1  CL 110 113*  CO2 17* 18*  GLUCOSE 152* 90  BUN 73* 69*  CREATININE 4.96* 4.85*  CALCIUM 8.0* 7.3*   Recent Labs    09/10/18 1006  PTH 29   Iron Studies:  Recent Labs    09/10/18 0530  IRON 46  TIBC 283  FERRITIN 23    Studies/Results: Mr Brain Wo Contrast  Result Date: 09/10/2018 CLINICAL DATA:  Headache. Hypertensive emergency. Chronic renal failure. EXAM: MRI HEAD WITHOUT CONTRAST TECHNIQUE: Multiplanar, multiecho pulse sequences of the brain and surrounding structures were obtained without intravenous contrast. COMPARISON:  CT head 09/08/2018. FINDINGS: Brain: No evidence for acute infarction, hemorrhage, mass lesion, hydrocephalus, or extra-axial fluid. Normal cerebral volume. Extensive, premature for age, T2 and FLAIR hyperintensities in the periventricular and subcortical white matter, of uncertain etiology, almost certainly a component of which is likely to represent small vessel disease. In addition however, confluent subcortical white matter signal abnormality,  parieto-occipital predominant but also involving the frontal and insular regions, concerning for PRES. No restricted diffusion. No cortical involvement. No definite brainstem or cerebellar involvement. Vascular: Flow voids are maintained throughout the carotid, basilar, and vertebral arteries. There are no areas of chronic hemorrhage. Skull and upper cervical spine: Normal marrow signal. Sinuses/Orbits: No sinus disease.  Negative orbits. Other: None. IMPRESSION: Extensive confluent BILATERAL subcortical white matter signal abnormality in this patient with hypertensive emergency could represent a manifestation of PRES. No restricted diffusion or cortical involvement. Underlying chronic microvascular ischemic changes are likely also present, but there is no acute stroke. Electronically Signed   By: Staci Righter M.D.   On: 09/10/2018 15:27   US Renal  Result Date: 09/09/2018 CLINICAL DATA:  Acute renal insufficiency.  Hypertension. EXAM: RENAL / URINARY TRACT ULTRASOUND COMPLETE COMPARISON:  CT 01/23/2017 FINDINGS: Right Kidney: Renal measurements: 8.9 x 4.4 x 6.0 cm = volume: 124 mL. Significant increased renal cortical echogenicity. Couple small cysts with the larger measuring 1.2 cm. No hydronephrosis. Left Kidney: Renal measurements: 8.5 x 5.1 x 4.6 cm = volume: 103 mL. Increased cortical echogenicity. 1.4 cm cyst over the upper pole. No hydronephrosis. Bladder: Appears normal for degree of bladder distention. IMPRESSION: Kidneys at the lower limits of normal in size with moderate increased cortical echogenicity compatible with medical renal disease. No hydronephrosis. Small bilateral renal cysts. Electronically Signed   By: Marin Olp M.D.   On: 09/09/2018 13:20   US Venous Img Lower Bilateral  Result Date: 09/09/2018 CLINICAL DATA:  Bilateral lower extremity edema. EXAM: BILATERAL LOWER EXTREMITY VENOUS DOPPLER ULTRASOUND TECHNIQUE: Gray-scale sonography  with graded compression, as well as color  Doppler and duplex ultrasound were performed to evaluate the lower extremity deep venous systems from the level of the common femoral vein and including the common femoral, femoral, profunda femoral, popliteal and calf veins including the posterior tibial, peroneal and gastrocnemius veins when visible. The superficial great saphenous vein was also interrogated. Spectral Doppler was utilized to evaluate flow at rest and with distal augmentation maneuvers in the common femoral, femoral and popliteal veins. COMPARISON:  None. FINDINGS: RIGHT LOWER EXTREMITY Common Femoral Vein: No evidence of thrombus. Normal compressibility, respiratory phasicity and response to augmentation. Saphenofemoral Junction: No evidence of thrombus. Normal compressibility and flow on color Doppler imaging. Profunda Femoral Vein: No evidence of thrombus. Normal compressibility and flow on color Doppler imaging. Femoral Vein: No evidence of thrombus. Normal compressibility, respiratory phasicity and response to augmentation. Popliteal Vein: No evidence of thrombus. Normal compressibility, respiratory phasicity and response to augmentation. Calf Veins: Visualized right deep calf veins are patent without thrombus. Superficial Great Saphenous Vein: No evidence of thrombus. Normal compressibility. Venous Reflux:  None. Other Findings:  None. LEFT LOWER EXTREMITY Common Femoral Vein: No evidence of thrombus. Normal compressibility, respiratory phasicity and response to augmentation. Saphenofemoral Junction: No evidence of thrombus. Normal compressibility and flow on color Doppler imaging. Profunda Femoral Vein: No evidence of thrombus. Normal compressibility and flow on color Doppler imaging. Femoral Vein: No evidence of thrombus. Normal compressibility, respiratory phasicity and response to augmentation. Popliteal Vein: No evidence of thrombus. Normal compressibility, respiratory phasicity and response to augmentation. Calf Veins: Visualized left  deep calf veins are patent without thrombus. Superficial Great Saphenous Vein: No evidence of thrombus. Normal compressibility. Venous Reflux:  None. Other Findings:  None. IMPRESSION: No evidence of deep venous thrombosis in either lower extremity. Electronically Signed   By: Markus Daft M.D.   On: 09/09/2018 16:00    I have reviewed the patient's current medications.  Assessment/Plan: 1 CKD 4 , ?? Mild AKI from uncontrolled bps. Stable, bp controlled ok for now 2 Anemia will give Fe  3 HPTH ok 5 NONADHERENCE primary issue 6 Bipolar P bp meds, Fe, F/u with Dr. Hollie Salk. Will s/o for now   LOS: 3 days   Jeneen Rinks Alencia Gordon 09/11/2018,7:14 AM

## 2018-09-11 NOTE — Progress Notes (Signed)
Pt IV removed, WNL. D/C instructions given to pt. Verbalized understanding. Pt awaiting ride home.

## 2018-09-12 IMAGING — DX DG SHOULDER 2+V*R*
3 series · 3 of 3 positions shown · non-contrast
Comparison: 09/02/2015

CLINICAL DATA: Fall after seizure today. Two previous right
shoulder surgeries. Pain anterior shoulder.

EXAM:
RIGHT SHOULDER - 2+ VIEW

[shoulder grashey]
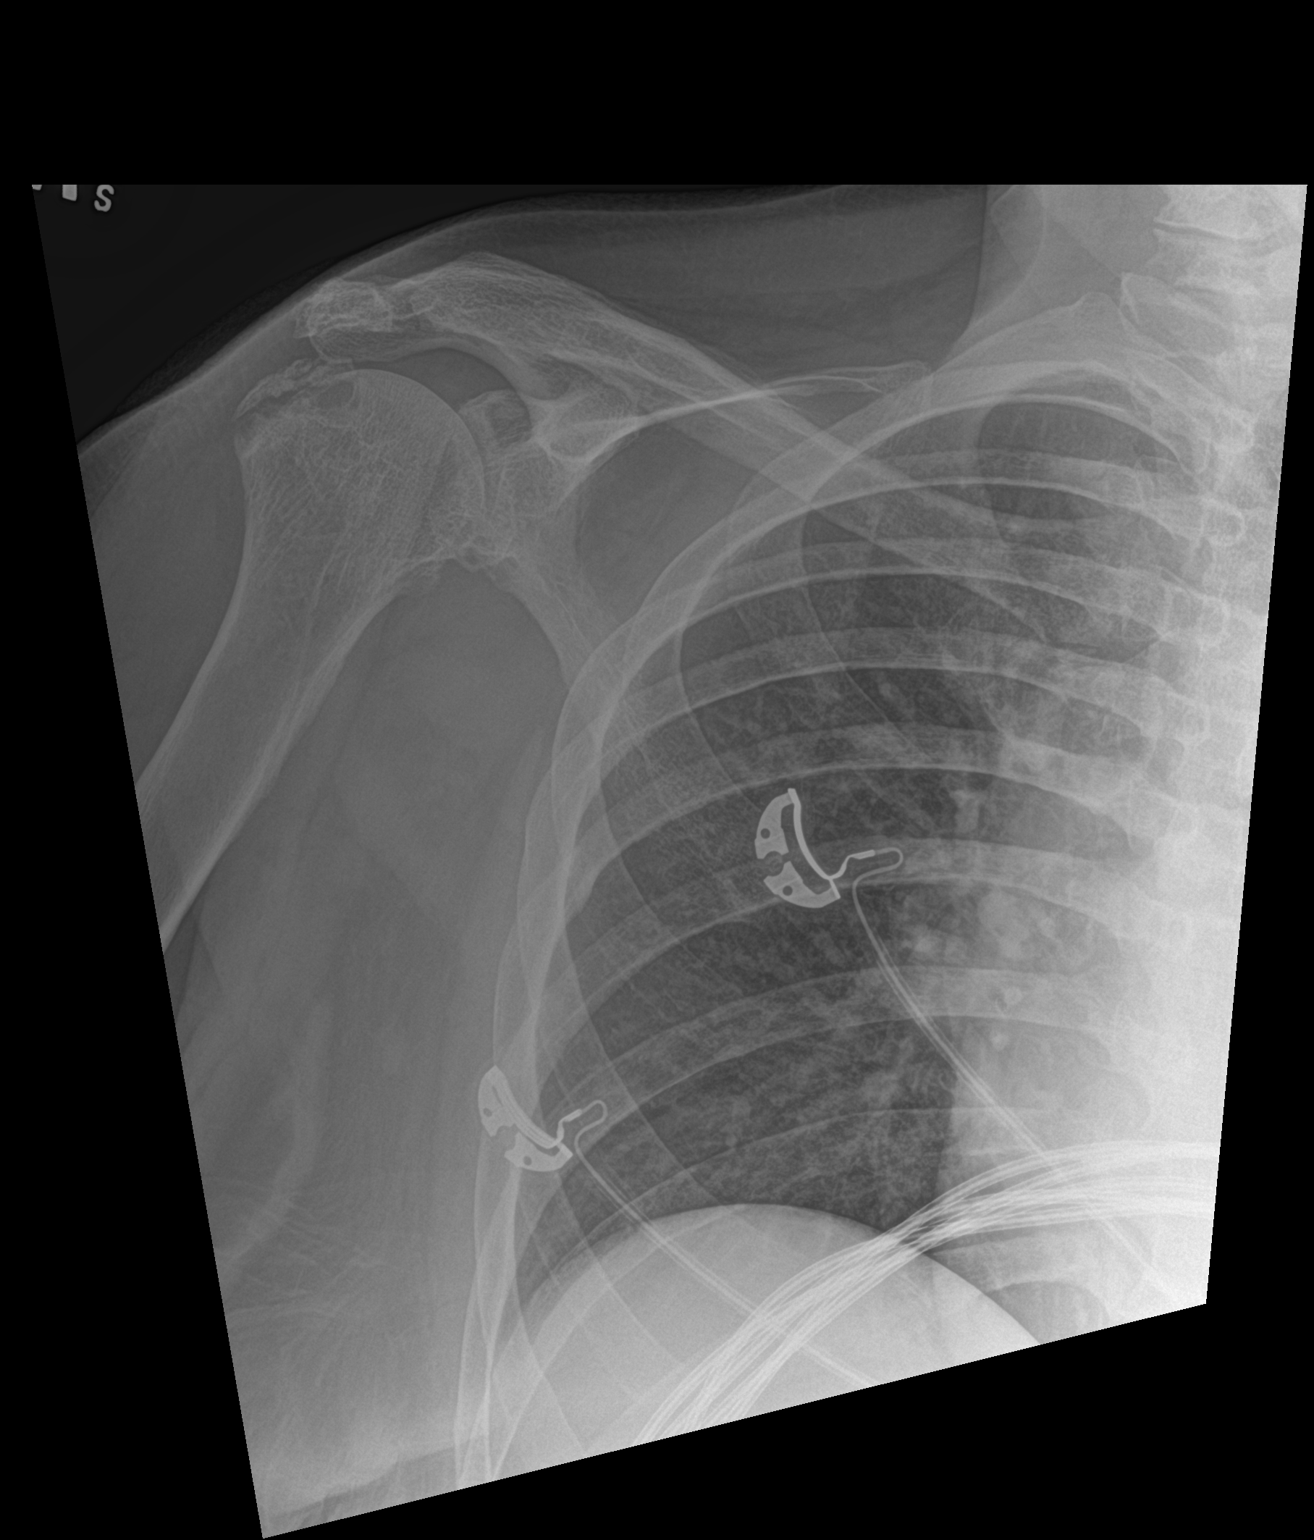

[shoulder y view]
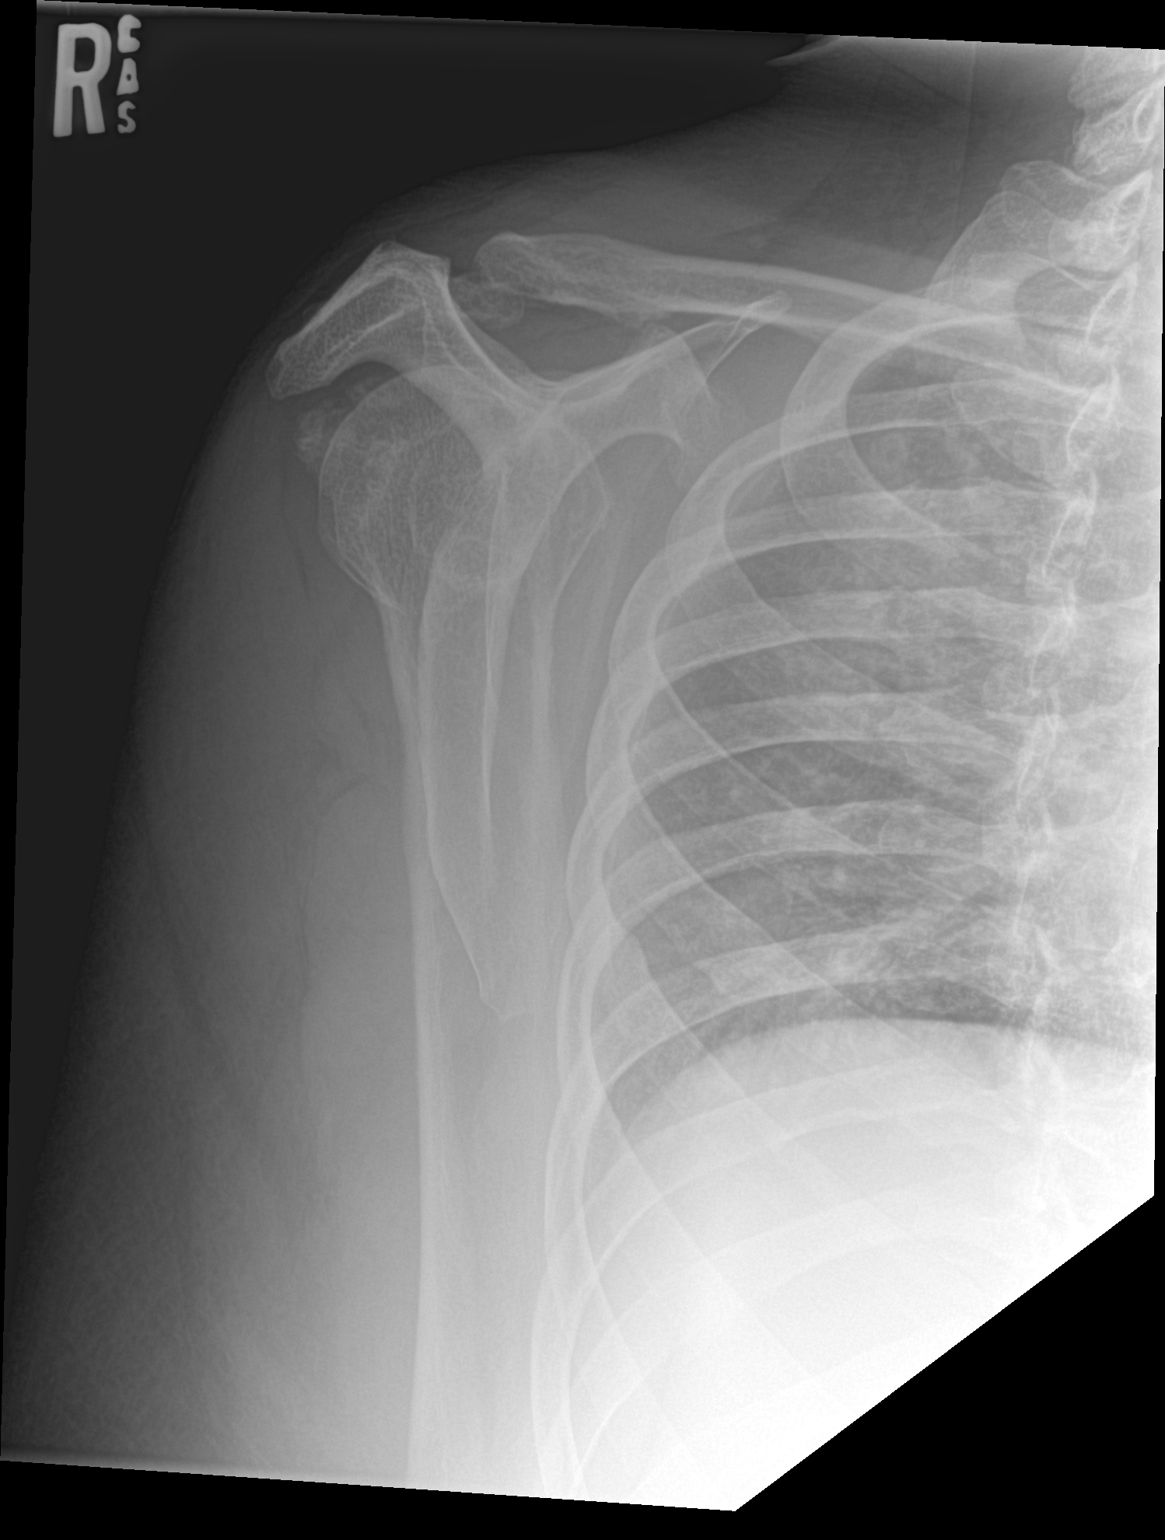

[shoulder axillary]
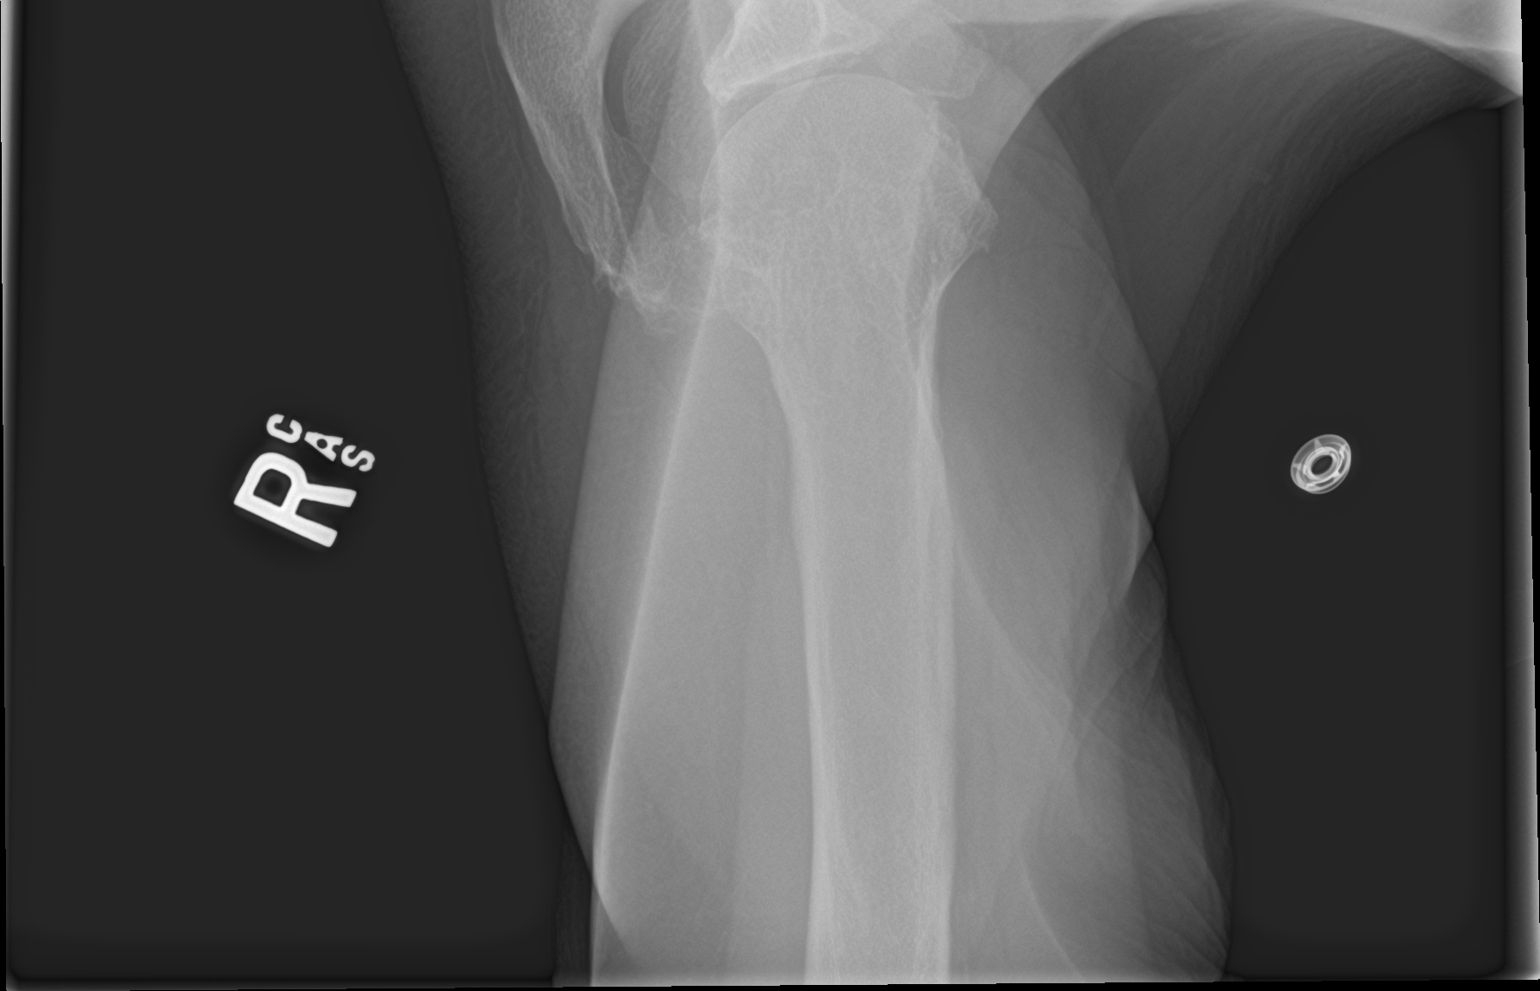

[3 of 3 positions shown; findings below may reference images not displayed]

FINDINGS: Degenerative change of the AC joint and glenohumeral joints.
Postsurgical irregularity of the lateral humeral head unchanged and
likely due to previous rotator cuff repair. There is a curvilinear
2.3 cm fragment adjacent the lateral aspect of the humeral head new
since the previous exam, although this may be acute or chronic. No
evidence of dislocation. Remainder of the exam is unchanged.
IMPRESSION: 2.3 cm curvilinear fragment adjacent the lateral aspect of the
humeral head which may be chronic versus acute chip fracture.

Postsurgical changes of the lateral humeral head as well as
degenerative changes of the AC joint and glenohumeral joints.

## 2018-10-03 DIAGNOSIS — I10 Essential (primary) hypertension: Secondary | ICD-10-CM | POA: Diagnosis not present

## 2018-10-03 DIAGNOSIS — R110 Nausea: Secondary | ICD-10-CM | POA: Diagnosis not present

## 2018-10-03 DIAGNOSIS — N19 Unspecified kidney failure: Secondary | ICD-10-CM | POA: Diagnosis not present

## 2018-10-04 DIAGNOSIS — Z9119 Patient's noncompliance with other medical treatment and regimen: Secondary | ICD-10-CM | POA: Diagnosis not present

## 2018-10-04 DIAGNOSIS — D631 Anemia in chronic kidney disease: Secondary | ICD-10-CM | POA: Diagnosis not present

## 2018-10-04 DIAGNOSIS — N185 Chronic kidney disease, stage 5: Secondary | ICD-10-CM | POA: Diagnosis not present

## 2018-10-04 DIAGNOSIS — I120 Hypertensive chronic kidney disease with stage 5 chronic kidney disease or end stage renal disease: Secondary | ICD-10-CM | POA: Diagnosis not present

## 2018-10-04 DIAGNOSIS — N189 Chronic kidney disease, unspecified: Secondary | ICD-10-CM | POA: Diagnosis not present

## 2018-10-04 DIAGNOSIS — N2581 Secondary hyperparathyroidism of renal origin: Secondary | ICD-10-CM | POA: Diagnosis not present

## 2018-10-20 ENCOUNTER — Other Ambulatory Visit: Payer: Self-pay | Admitting: Family Medicine

## 2018-10-20 DIAGNOSIS — A6004 Herpesviral vulvovaginitis: Secondary | ICD-10-CM

## 2018-11-08 DIAGNOSIS — D631 Anemia in chronic kidney disease: Secondary | ICD-10-CM | POA: Diagnosis not present

## 2018-11-08 DIAGNOSIS — K625 Hemorrhage of anus and rectum: Secondary | ICD-10-CM | POA: Diagnosis not present

## 2018-11-08 DIAGNOSIS — N189 Chronic kidney disease, unspecified: Secondary | ICD-10-CM | POA: Diagnosis not present

## 2018-11-08 DIAGNOSIS — N2581 Secondary hyperparathyroidism of renal origin: Secondary | ICD-10-CM | POA: Diagnosis not present

## 2018-11-08 DIAGNOSIS — I120 Hypertensive chronic kidney disease with stage 5 chronic kidney disease or end stage renal disease: Secondary | ICD-10-CM | POA: Diagnosis not present

## 2018-11-08 DIAGNOSIS — N185 Chronic kidney disease, stage 5: Secondary | ICD-10-CM | POA: Diagnosis not present

## 2018-12-05 ENCOUNTER — Telehealth: Payer: Self-pay | Admitting: Neurology

## 2018-12-05 NOTE — Telephone Encounter (Signed)
Dr. Delice Lesch has not prescribed venlafaxine for this patient. She should request this through her PCP, Caesar Chestnut, NP who last prescribed it.

## 2018-12-05 NOTE — Telephone Encounter (Signed)
Patient has been scheduled.  Thank you.

## 2018-12-05 NOTE — Telephone Encounter (Signed)
Please call the pt back.  She has not been seen in over one year. Dr. Delice Lesch will not fill Venlafaxine ( Effexor). After an appt is scheduled I can ask Delice Lesch if she is ok sending in refills until the appt.

## 2018-12-05 NOTE — Telephone Encounter (Signed)
Patient called in needing her medication filled. She said the pharmacy would not fill it. She could not remember the name of it. Patient said she thinks the medication name starts with a V.

## 2018-12-06 NOTE — Telephone Encounter (Signed)
Informed pt via mychart.

## 2018-12-12 ENCOUNTER — Emergency Department (HOSPITAL_COMMUNITY)
Admission: EM | Admit: 2018-12-12 | Discharge: 2018-12-13 | Disposition: A | Discharge status: Home / Self Care | Payer: Medicare Other | Attending: Emergency Medicine | Admitting: Emergency Medicine

## 2018-12-12 ENCOUNTER — Other Ambulatory Visit: Payer: Self-pay

## 2018-12-12 ENCOUNTER — Encounter (HOSPITAL_COMMUNITY): Payer: Self-pay | Admitting: Emergency Medicine

## 2018-12-12 DIAGNOSIS — Z79899 Other long term (current) drug therapy: Secondary | ICD-10-CM | POA: Insufficient documentation

## 2018-12-12 DIAGNOSIS — R0689 Other abnormalities of breathing: Secondary | ICD-10-CM | POA: Diagnosis not present

## 2018-12-12 DIAGNOSIS — R000 Tachycardia, unspecified: Secondary | ICD-10-CM | POA: Diagnosis not present

## 2018-12-12 DIAGNOSIS — R569 Unspecified convulsions: Secondary | ICD-10-CM | POA: Diagnosis not present

## 2018-12-12 DIAGNOSIS — G40909 Epilepsy, unspecified, not intractable, without status epilepticus: Secondary | ICD-10-CM | POA: Diagnosis not present

## 2018-12-12 DIAGNOSIS — Z87891 Personal history of nicotine dependence: Secondary | ICD-10-CM | POA: Insufficient documentation

## 2018-12-12 DIAGNOSIS — Z888 Allergy status to other drugs, medicaments and biological substances status: Secondary | ICD-10-CM | POA: Diagnosis not present

## 2018-12-12 DIAGNOSIS — Z882 Allergy status to sulfonamides status: Secondary | ICD-10-CM | POA: Insufficient documentation

## 2018-12-12 DIAGNOSIS — N184 Chronic kidney disease, stage 4 (severe): Secondary | ICD-10-CM | POA: Diagnosis not present

## 2018-12-12 DIAGNOSIS — I129 Hypertensive chronic kidney disease with stage 1 through stage 4 chronic kidney disease, or unspecified chronic kidney disease: Secondary | ICD-10-CM | POA: Insufficient documentation

## 2018-12-12 DIAGNOSIS — Z9114 Patient's other noncompliance with medication regimen: Secondary | ICD-10-CM | POA: Insufficient documentation

## 2018-12-12 DIAGNOSIS — R410 Disorientation, unspecified: Secondary | ICD-10-CM | POA: Diagnosis not present

## 2018-12-12 MED ORDER — LEVETIRACETAM ER 500 MG PO TB24: 500.0000 mg | ORAL_TABLET | Freq: Every day | ORAL | 0 refills | Status: DC

## 2018-12-12 MED ORDER — LEVETIRACETAM IN NACL 500 MG/100ML IV SOLN
500.0000 mg | Freq: Once | INTRAVENOUS | Status: AC
  Administered 2018-12-12: 15:00:00 500 mg via INTRAVENOUS
  Filled 2018-12-12: qty 100

## 2018-12-12 NOTE — ED Provider Notes (Signed)
[facility] EMERGENCY DEPARTMENT Provider Note   CSN: [id number] Arrival date & time: 12/12/18  1159     History   Chief Complaint Chief Complaint  Patient presents with  . Seizures    HPI [name] is a 53 y.o. female.     HPI   48yF with seizure. Happened earlier today. Hx of the same. Says she hasnt taken seizure meds in over a week. Currently no complaints aside from feeling somewhat tired. No fever. Has been sleeping ok recently.   Past Medical History:  Diagnosis Date  . Anemia   . Anxiety   . Bipolar disorder ([place])   . Depression   . History of blood transfusion   . Hypertension   . IUD    HISTORY OF IUD --REMOVED IN 2006  . Seizures ([place]) [id number]   two seizures due to a med changes    Patient Active Problem List   Diagnosis Date Noted  . Acute renal failure superimposed on stage 4 chronic kidney disease ([place]) 09/10/2018  . Hypertensive urgency, malignant   . Acute renal failure superimposed on chronic kidney disease ([place])   . Hypertensive emergency 09/08/2018  . Acute renal injury ([place]) 09/08/2018  . Seizure ([place]) 07/06/2017  . Noncompliance with medication regimen 04/27/2017  . Vasovagal syncope 03/29/2017  . LGSIL on Pap smear of cervix 04/26/2016  . Herpes simplex vulvovaginitis 03/01/2016  . Lower extremity edema 07/20/2015  . Chronic kidney disease, stage IV (severe) ([place]) 05/14/2015  . Proteinuria 05/14/2015  . Generalized headaches 01/05/2015  . Weight gain following gastric bypass surgery 08/13/2014  . Deficiency anemia 01/06/2014  . Iron deficiency anemia 01/06/2014  . Vitamin B12 deficiency (dietary) anemia 01/06/2014  . Essential hypertension, benign 01/25/2013  . Bipolar disorder [facility])     Past Surgical History:  Procedure Laterality Date  . CARPAL TUNNEL RELEASE Right 07/08/2014   Procedure: RIGHT CARPAL TUNNEL RELEASE;  Surgeon: Sanjuana Kava, MD;  Location: [facility];  Service: Orthopedics;  Laterality: Right;  .  CHOLECYSTECTOMY    . GASTRIC BYPASS  2000  . HYSTEROSCOPY W/D&C  11/14/2011   Procedure: DILATATION AND CURETTAGE /HYSTEROSCOPY;  Surgeon: Marylynn Pearson, MD;  Location: [place] [facility];  Service: Gynecology;;  with removal of polyps  . INTRAUTERINE DEVICE (IUD) INSERTION  03/21/2010  . LAPAROSCOPIC GASTROTOMY W/ REPAIR OF ULCER    . PANNICULECTOMY    . ROTATOR CUFF REPAIR       OB History    Gravida  1   Para      Term      Preterm      AB  1   Living  0     SAB  1   TAB      Ectopic      Multiple      Live Births               Home Medications    Prior to Admission medications   Medication Sig Start Date End Date Taking? Authorizing Provider  acetaminophen (TYLENOL 8 HOUR) 650 MG CR tablet Take 650 mg by mouth every 8 (eight) hours as needed for pain.    [provider]  amLODipine (NORVASC) 10 MG tablet Take 1 tablet (10 mg total) by mouth at bedtime. 09/11/18   Orson Eva, MD  carvedilol (COREG) 12.5 MG tablet Take 1 tablet (12.5 mg total) by mouth 2 (two) times daily with a meal. 09/11/18   Orson Eva, MD  ferrous sulfate 325 (  65 FE) MG tablet Take 325 mg by mouth 3 (three) times daily with meals.    [provider]  furosemide (LASIX) 80 MG tablet Take 1 tablet (80 mg total) by mouth daily. 09/12/18   Orson Eva, MD  sodium bicarbonate 650 MG tablet Take 2 tablets (1,300 mg total) by mouth 2 (two) times daily. 09/11/18   Orson Eva, MD  tiZANidine (ZANAFLEX) 4 MG tablet TAKE 1 TABLET BY MOUTH EVERY 12 HOURS AS NEEDED FOR SPASM Patient taking differently: Take 4 mg by mouth every 12 (twelve) hours as needed for muscle spasms.  07/24/18   Sanjuana Kava, MD  traMADol (ULTRAM) 50 MG tablet TAKE 1 TABLET BY MOUTH EVERY 6 HOURS AS NEEDED Patient taking differently: Take 50 mg by mouth every 6 (six) hours as needed for moderate pain. TAKE 1 TABLET BY MOUTH EVERY 6 HOURS AS NEEDED 07/24/18   Sanjuana Kava, MD  valACYclovir (VALTREX) 1000 MG tablet TAKE 1/2 TABLET  BY MOUTH DAILY 10/22/18   Donnamae Jude, MD  venlafaxine XR (EFFEXOR-XR) 150 MG 24 hr capsule Take 1 capsule (150 mg total) by mouth 2 (two) times daily. Needs appointment for future refills. 01/08/18   Lance Sell, NP  vitamin B-12 (CYANOCOBALAMIN) 250 MCG tablet Take 250 mcg by mouth daily.    [provider]    Family History Family History  Problem Relation Age of Onset  . Hypertension Mother   . Kidney failure Mother   . Hypertension Father   . Diabetes Cousin   . Diabetes Sister   . Hypertension Sister   . Kidney disease Sister        dialysis  . Hypertension Brother   . Heart attack Brother   . Hypertension Maternal Aunt   . Hypertension Maternal Uncle   . Hypertension Maternal Grandmother   . Hypertension Maternal Grandfather   . Hypertension Paternal Grandmother   . Hypertension Paternal Grandfather   . Heart attack Brother   . Hypertension Brother   . Hypertension Sister     Social History Social History   Tobacco Use  . Smoking status: Former Smoker    Packs/day: 0.25    Years: 4.00    Pack years: 1.00    Types: Cigarettes    Quit date: 03/21/1988    Years since quitting: 30.7  . Smokeless tobacco: Never Used  Substance Use Topics  . Alcohol use: Yes    Comment: 1-2 times a year.   . Drug use: No     Allergies   Ambien [zolpidem tartrate], Geodon [ziprasidone hcl], Lactulose, Ondansetron, Quetiapine, and Sulfa antibiotics   Review of Systems Review of Systems  All systems reviewed and negative, other than as noted in HPI.  Physical Exam Updated Vital Signs BP 111/75   Pulse (!) 106   Temp 99 F (37.2 C) (Oral)   Resp 17   Ht 4\' 11"  (1.499 m)   Wt 81.6 kg   SpO2 98%   BMI 36.36 kg/m   Physical Exam Vitals signs and nursing note reviewed.  Constitutional:      General: She is not in acute distress.    Appearance: She is well-developed.  HENT:     Head: Normocephalic and atraumatic.  Eyes:     General:        Right  eye: No discharge.        Left eye: No discharge.     Conjunctiva/sclera: Conjunctivae normal.  Neck:     Musculoskeletal: Neck  supple.  Cardiovascular:     Rate and Rhythm: Normal rate and regular rhythm.     Heart sounds: Normal heart sounds. No murmur. No friction rub. No gallop.   Pulmonary:     Effort: Pulmonary effort is normal. No respiratory distress.     Breath sounds: Normal breath sounds.  Abdominal:     General: There is no distension.     Palpations: Abdomen is soft.     Tenderness: There is no abdominal tenderness.  Musculoskeletal:        General: No tenderness.  Skin:    General: Skin is warm and dry.  Neurological:     Mental Status: She is alert and oriented to person, place, and time.     Cranial Nerves: No cranial nerve deficit.     Sensory: No sensory deficit.     Motor: No weakness.  Psychiatric:        Behavior: Behavior normal.        Thought Content: Thought content normal.      ED Treatments / Results  Labs (all labs ordered are listed, but only abnormal results are displayed) Labs Reviewed - No data to display  EKG EKG Interpretation  Date/Time:  Wednesday December 12 2018 12:10:12 EDT Ventricular Rate:  110 PR Interval:    QRS Duration: 95 QT Interval:  375 QTC Calculation: 508 R Axis:   -3 Text Interpretation:  Sinus tachycardia Inferior infarct, old Confirmed by Virgel Manifold [phone]) on 12/12/2018 12:53:39 PM   Radiology No results found.  Procedures Procedures (including critical care time)  Medications Ordered in ED Medications  levETIRAcetam (KEPPRA) IVPB 500 mg/100 mL premix (0 mg Intravenous Stopping Infusion hung by another clincian 12/12/18 1508)     Initial Impression / Assessment and Plan / ED Course  I have reviewed the triage vital signs and the nursing notes.  Pertinent labs & imaging results that were available during my care of the patient were reviewed by me and considered in my medical decision making (see  chart for details).       53yF with seizure like activity. Likely from medication noncompliance. Loaded with keppra in ED. Neuro exam nonfocal.   Final Clinical Impressions(s) / ED Diagnoses   Final diagnoses:  Seizure-like activity [facility])    ED Discharge Orders    None       Virgel Manifold, MD 12/16/18 [phone]

## 2018-12-12 NOTE — ED Triage Notes (Signed)
Per EMS patient had witnessed seizure. Pt states she has been out of her Rx for seizure for last 2 weeks. Patient is alert x 4 in triage.

## 2018-12-18 ENCOUNTER — Encounter: Payer: Self-pay | Admitting: Neurology

## 2018-12-18 ENCOUNTER — Other Ambulatory Visit: Payer: Self-pay

## 2018-12-18 ENCOUNTER — Telehealth (INDEPENDENT_AMBULATORY_CARE_PROVIDER_SITE_OTHER): Payer: Medicare Other | Admitting: Neurology

## 2018-12-18 VITALS — Ht 59.0 in | Wt 180.0 lb

## 2018-12-18 DIAGNOSIS — G40009 Localization-related (focal) (partial) idiopathic epilepsy and epileptic syndromes with seizures of localized onset, not intractable, without status epilepticus: Secondary | ICD-10-CM

## 2018-12-18 MED ORDER — LEVETIRACETAM ER 500 MG PO TB24: 500.0000 mg | ORAL_TABLET | Freq: Every day | ORAL | 3 refills | Status: DC

## 2018-12-18 NOTE — Progress Notes (Signed)
Virtual Visit via Video Note The purpose of this virtual visit is to provide medical care while limiting exposure to the novel coronavirus.    Consent was obtained for video visit:  Yes.   Answered questions that patient had about telehealth interaction:  Yes.   I discussed the limitations, risks, security and privacy concerns of performing an evaluation and management service by telemedicine. I also discussed with the patient that there may be a patient responsible charge related to this service. The patient expressed understanding and agreed to proceed.  Pt location: Home Physician Location: office Name of referring provider:  Pixie Casino, MD I connected with [name] at patients initiation/request on 12/18/2018 at  1:00 PM EDT by video enabled telemedicine application and verified that I am speaking with the correct person using two identifiers. Pt MRN:  [id number] Pt DOB:  04-06-1965 Video Participants:  [name]   History of Present Illness:  The patient was seen as a virtual video visit on 12/18/2018. She was last seen in the neurology clinic a year ago for recurrent episodes of loss of consciousness. She had been on low dose Levetiracetam 500mg  qhs with no events for almost 2 years until she ran out of medication for 2 weeks and had an unwitnessed seizure on 12/12/2018. There was no prior warning, she was alone and woke up on the ground. She had hit her head and hurt her left shoulder, no tongue bite or incontinence. She went to the ER and was discharged home on Levetiracetam ER 500mg  qhs. She denies any episodes of staring, gaps in time, olfactory/gustatory hallucinations, focal numbness/tingling/weakness. She denies any headaches, dizziness, vision changes, no other falls. She was in the hospital in June for headaches and found to be significantly hypertensive with BP 215/132, creatinine 5.06. She apparently stopped all her BP medications. She had an MRI brain which  showed extensive confluent bilateral subcortical white matter signal. Of note, she had an MRI brain at [facility] in 11/2017 showing extensive scattered and confluent FLAIR signal in the periventricular white matter, most significant in the paramedian frontal lobes, periventricular white matter, and external capsules. I suspect there are due to uncontrolled BP in the setting of renal failure. She reports that her nephrologist has recommended palliative care, she refuses hemodialysis. She lives alone and does not drive.   History on Initial Assessment 10/23/2017: This is a 53 year old right-handed woman with a history of hypertension, bipolar disorder, presenting for evaluation of recurrent episodes of loss of consciousness. She reports having a seizure with incontinence in 2010 when she was put on Geodon. Otherwise she denies any prior seizure history. She recalls an episode in 2013 where she felt lightheaded and fell and hit her head. She went to the ER and was diagnosed with vertigo. She reports an episode in 2016 while sitting on the couch, she got up and went to bed. When she woke up her face felt different and she saw she had carpet burns on her face. She recalls another episode where she was on the computer and got up, then she recalls seeing herself falling, she could not stand so she crawled to the bed. She was in the ER in 01/2017 for headaches. She reported unwitnessed syncopal episode a week prior where she stood up, felt lightheaded, then blacked out and woke up on the floor covered in stool. She tridd to stand up and again felt lightheaded and had a second episode. She did not go  to the ER but continued to have fecal incontinence and saw GI and told it was her IBS. She reported that "something doesn't feel right and keeps getting worse. Bloodwork was unremarkable. Head CT no acute changes, chronic microvascular disease was seen. She was back in the ER on 02/01/17 after she had a witnessed seizure at the  [facility]. She apparently ran out of benzodiazepine and Topamax (prescribed by her psychiatrist Dr. Toy Care for insomnia). She reports another episode around Thanksgiving where she was sitting and talking to her friend when she passed out, no shaking reported, no tongue bite or incontinence. History is unclear about her medications, but she does not recall being started on Keppra in the hospital, stating her PCP started her on this. She does note it helps her sleep, she only takes it once a day at bedtime. She has been out of Topiramate 500mg  qhs the past 2 weeks, at that time she was nauseated, with terrible pounding on the right side of her head. This has improved. She denies any further syncopal episodes since November 2018. She lives alone but is currently staying with her sister-in-law, who has not told her of any staring/unresponsive episodes. She does note some gaps in time, she would be watching TV then next thing she knows it is a few hours later. She has episodes in the morning where she gets a hungry feeling and gets nauseated, she usually gets something to eat and feels better sometimes. She usually gets only 2-3 hours of sleep but does feel the Keppra helps. She has right hand numbness (history of bilateral carpal tunnel surgery), no olfactory/gustatory hallucinations or myoclonic jerks. She has constant shoulder and knee pain. Her father and maternal grandmother had seizures. Otherwise she had a normal birth and early development.  There is no history of febrile convulsions, CNS infections such as meningitis/encephalitis, significant traumatic brain injury, neurosurgical procedures.  Diagnostic Data: MRI brain done 08/2018 showed extensive white matter signal, similar to MRI in 2019. EEG: normal wake and sleep EEG except for diffuse low voltage beta activity     Current Outpatient Medications on File Prior to Visit  Medication Sig Dispense Refill   acetaminophen (TYLENOL 8 HOUR) 650 MG CR tablet  Take 650 mg by mouth every 8 (eight) hours as needed for pain.     carvedilol (COREG) 12.5 MG tablet Take 1 tablet (12.5 mg total) by mouth 2 (two) times daily with a meal. 60 tablet 1   furosemide (LASIX) 80 MG tablet Take 1 tablet (80 mg total) by mouth daily. 30 tablet 1   tiZANidine (ZANAFLEX) 4 MG tablet TAKE 1 TABLET BY MOUTH EVERY 12 HOURS AS NEEDED FOR SPASM (Patient taking differently: Take 4 mg by mouth every 12 (twelve) hours as needed for muscle spasms. ) 60 tablet 4   traMADol (ULTRAM) 50 MG tablet TAKE 1 TABLET BY MOUTH EVERY 6 HOURS AS NEEDED (Patient taking differently: Take 50 mg by mouth every 6 (six) hours as needed for moderate pain. TAKE 1 TABLET BY MOUTH EVERY 6 HOURS AS NEEDED) 90 tablet 3   valACYclovir (VALTREX) 1000 MG tablet TAKE 1/2 TABLET BY MOUTH DAILY 30 tablet 5   venlafaxine XR (EFFEXOR-XR) 150 MG 24 hr capsule Take 1 capsule (150 mg total) by mouth 2 (two) times daily. Needs appointment for future refills. 180 capsule 0   No current facility-administered medications on file prior to visit.      Observations/Objective:   Vitals:   12/18/18 1117  Weight:  180 lb (81.6 kg)  Height: 4\' 11"  (1.499 m)   GEN:  The patient appears stated age and is in NAD.  Neurological examination: Patient is awake, alert, oriented x 3. No aphasia or dysarthria. Intact fluency and comprehension. Remote and recent memory intact. Able to name and repeat. Cranial nerves: Extraocular movements intact with no nystagmus. No facial asymmetry. Motor: moves all extremities symmetrically, at least anti-gravity x 4 (reports left shoulder pain). No incoordination on finger to nose testing. Gait: slow and cautious, no ataxia   Assessment and Plan:   This is a 53 yo RH woman with a history of hypertension, CKD, bipolar disorder, with recurrent episodes of loss of consciousness. MRI brain shows extensive confluent white matter signal (unchanged from 2019), EEG normal. She had another  unwitnessed episode last 12/12/2018 in the setting of missing medication x 2 weeks. Prior to this, her last event was in 2018. She is back on Levetiracetam ER 500mg  qhs, refills sent. She does not drive. She will follow-up in 6 months and knows to call for any changes.    Follow Up Instructions:   -I discussed the assessment and treatment plan with the patient. The patient was provided an opportunity to ask questions and all were answered. The patient agreed with the plan and demonstrated an understanding of the instructions.   The patient was advised to call back or seek an in-person evaluation if the symptoms worsen or if the condition fails to improve as anticipated.     Cameron Sprang, MD

## 2018-12-27 ENCOUNTER — Other Ambulatory Visit: Payer: Self-pay | Admitting: Orthopaedic Surgery

## 2018-12-28 ENCOUNTER — Other Ambulatory Visit: Payer: Self-pay | Admitting: Internal Medicine

## 2018-12-28 ENCOUNTER — Other Ambulatory Visit: Payer: Self-pay | Admitting: Orthopaedic Surgery

## 2018-12-28 NOTE — Telephone Encounter (Signed)
Please see note below. 

## 2018-12-28 NOTE — Telephone Encounter (Signed)
Pt overdue for future follow up. Please contact pt for future appointment.

## 2018-12-31 ENCOUNTER — Telehealth: Payer: Self-pay | Admitting: Internal Medicine

## 2018-12-31 NOTE — Telephone Encounter (Signed)
LVM for patient to call and schedule appointment with Dr. Debara Pickett.

## 2019-01-01 NOTE — Telephone Encounter (Signed)
Patient had sent an electronic (surescripts interface) refill request; also called to follow up on whether the following medication can be refilled, or must patient have telephone/in-person visit with Dr Luna Glasgow, which we have offered  - patient will await Dr Brooke Bonito response regarding medication: Tramadol/Ultram 50mg  Pharmacy: [facility] on [address]

## 2019-01-01 NOTE — Telephone Encounter (Signed)
Patient called, states does not understand why she is not hearing back about the pharmacy (surescripts) electronic request for medication:  Tramadol/Ultram 50 mg Pharmacy: [facility] on [address]* *We have offered appointment, as it was in 2019 that patient was last seen; patient declined until Dr Luna Glasgow advises. States has been without medication; said had to go to the hospital.

## 2019-01-21 ENCOUNTER — Telehealth (INDEPENDENT_AMBULATORY_CARE_PROVIDER_SITE_OTHER): Payer: Medicare Other | Admitting: Internal Medicine

## 2019-01-21 ENCOUNTER — Encounter: Payer: Self-pay | Admitting: Internal Medicine

## 2019-01-21 DIAGNOSIS — R569 Unspecified convulsions: Secondary | ICD-10-CM | POA: Diagnosis not present

## 2019-01-21 DIAGNOSIS — I10 Essential (primary) hypertension: Secondary | ICD-10-CM

## 2019-01-21 DIAGNOSIS — Z9114 Patient's other noncompliance with medication regimen: Secondary | ICD-10-CM

## 2019-01-21 DIAGNOSIS — N184 Chronic kidney disease, stage 4 (severe): Secondary | ICD-10-CM

## 2019-01-21 DIAGNOSIS — R600 Localized edema: Secondary | ICD-10-CM

## 2019-01-21 DIAGNOSIS — Z91148 Patient's other noncompliance with medication regimen for other reason: Secondary | ICD-10-CM

## 2019-01-21 DIAGNOSIS — N178 Other acute kidney failure: Secondary | ICD-10-CM

## 2019-01-21 NOTE — Patient Instructions (Signed)
Medication Instructions:  Your physician recommends that you continue on your current medications as directed. Please refer to the Current Medication list given to you today.  *If you need a refill on your cardiac medications before your next appointment, please call your pharmacy*  Lab Work: NONE If you have labs (blood work) drawn today and your tests are completely normal, you will receive your results only by: Marland Kitchen MyChart Message (if you have MyChart) OR . A paper copy in the mail If you have any lab test that is abnormal or we need to change your treatment, we will call you to review the results.  Testing/Procedures: NONE  Follow-Up: At [facility], you and your health needs are our priority.  As part of our continuing mission to provide you with exceptional heart care, we have created designated Provider Care Teams.  These Care Teams include your primary Cardiologist (physician) and Advanced Practice Providers (APPs -  Physician Assistants and Nurse Practitioners) who all work together to provide you with the care you need, when you need it.  Your next appointment:   3 months  The format for your next appointment:   In Person  Provider:   You may see Dr. Debara Pickett or one of the following Advanced Practice Providers on your designated Care Team:    Almyra Deforest, PA-C  Fabian Sharp, [place] or   Roby Lofts, [place]   Other Instructions  Please check BP at home and call with readings or send via [place]:  Rest 5 minutes before taking your blood pressure.  Don't smoke or drink caffeinated beverages for at least 30 minutes before.  Take your blood pressure before (not after) you eat.  Sit comfortably with your back supported and both feet on the floor (don't cross your legs).  Elevate your arm to heart level on a table or a desk.  Use the proper sized cuff. It should fit smoothly and snugly around your bare upper arm. There should be enough  room to slip a fingertip under the cuff. The bottom edge of the cuff should be 1 inch above the crease of the elbow.  Ideally, take 3 measurements at one sitting and record the average.

## 2019-01-21 NOTE — Progress Notes (Signed)
Virtual Visit via Telephone Note   This visit type was conducted due to national recommendations for restrictions regarding the COVID-19 Pandemic (e.g. social distancing) in an effort to limit this patient's exposure and mitigate transmission in our community.  Due to her co-morbid illnesses, this patient is at least at moderate risk for complications without adequate follow up.  This format is felt to be most appropriate for this patient at this time.  The patient did not have access to video technology/had technical difficulties with video requiring transitioning to audio format only (telephone).  All issues noted in this document were discussed and addressed.  No physical exam could be performed with this format.  Please refer to the patient's chart for her  consent to telehealth for [facility].   Evaluation Performed:  Telephone follow-up  Date:  01/21/2019   ID:  [name], DOB 1965-05-20, MRN [id number]  Patient Location:  [place] [address]  Provider location:   [address] [place] [address]  PCP:  Pixie Casino, MD  Cardiologist:  No primary care provider on file. Electrophysiologist:  None   Chief Complaint:  Headache, nausea, seizures  History of Present Illness:    [name] is a 53 y.o. female who presents via audio/video conferencing for a telehealth visit today.  This is a 53 year old female with a history of poorly controlled hypertension, seizures, headaches who recently was hospitalized for intractable headache and was found to have PRES on MRI.  She also had an elevated creatinine was found to have advanced medical renal disease with small kidneys and CKD 4.  She was consulted on by nephrology.  Ultimately she will follow-up with Dr. Hollie Salk.  She was placed on amlodipine as well as carvedilol although she has discontinued amlodipine due to side effects.  For a while she was living in [place] however is  come back to [place].  She has not yet unpacked and therefore does not have a blood pressure cuff to take blood pressures.  I suspect they are elevated.  She recently had some seizure activity and saw her neurologist in September who encouraged her to be compliant with her medication.  The patient does not have symptoms concerning for COVID-19 infection (fever, chills, cough, or new SHORTNESS OF BREATH).    Prior CV studies:   The following studies were reviewed today:  Chart reviewed Labwork  PMHx:  Past Medical History:  Diagnosis Date  . Anemia   . Anxiety   . Bipolar disorder ([place])   . Depression   . History of blood transfusion   . Hypertension   . IUD    HISTORY OF IUD --REMOVED IN 2006  . Seizures ([place]) [id number]   two seizures due to a med changes    Past Surgical History:  Procedure Laterality Date  . CARPAL TUNNEL RELEASE Right 07/08/2014   Procedure: RIGHT CARPAL TUNNEL RELEASE;  Surgeon: Sanjuana Kava, MD;  Location: [facility];  Service: Orthopedics;  Laterality: Right;  . CHOLECYSTECTOMY    . GASTRIC BYPASS  2000  . HYSTEROSCOPY W/D&C  11/14/2011   Procedure: DILATATION AND CURETTAGE /HYSTEROSCOPY;  Surgeon: Marylynn Pearson, MD;  Location: [place] [facility];  Service: Gynecology;;  with removal of polyps  . INTRAUTERINE DEVICE (IUD) INSERTION  03/21/2010  . LAPAROSCOPIC GASTROTOMY W/ REPAIR OF ULCER    . PANNICULECTOMY    . ROTATOR CUFF REPAIR      FAMHx:  Family History  Problem  Relation Age of Onset  . Hypertension Mother   . Kidney failure Mother   . Hypertension Father   . Diabetes Cousin   . Diabetes Sister   . Hypertension Sister   . Kidney disease Sister        dialysis  . Hypertension Brother   . Heart attack Brother   . Hypertension Maternal Aunt   . Hypertension Maternal Uncle   . Hypertension Maternal Grandmother   . Hypertension Maternal Grandfather   . Hypertension Paternal Grandmother   . Hypertension Paternal Grandfather   . Heart attack  Brother   . Hypertension Brother   . Hypertension Sister     SOCHx:   reports that she quit smoking about 30 years ago. Her smoking use included cigarettes. She has a 1.00 pack-year smoking history. She has never used smokeless tobacco. She reports current alcohol use. She reports that she does not use drugs.  ALLERGIES:  Allergies  Allergen Reactions  . Ambien [Zolpidem Tartrate]     "BLACK OUT", SLEEP DRIVING  . Geodon [Ziprasidone Hcl]     Seizure activity  . Lactulose Itching  . Ondansetron Nausea And Vomiting  . Quetiapine     Other reaction(s): Other (See Comments) Possible Hair Loss  . Sulfa Antibiotics Rash    MEDS:  Current Meds  Medication Sig  . acetaminophen (TYLENOL 8 HOUR) 650 MG CR tablet Take 650 mg by mouth every 8 (eight) hours as needed for pain.  Marland Kitchen aspirin EC 81 MG tablet Take 81 mg by mouth daily.  . carvedilol (COREG) 25 MG tablet Take 25 mg by mouth 2 (two) times daily with a meal.  . Ferrous Fumarate (HEMOCYTE - 106 MG FE) 324 (106 Fe) MG TABS tablet Take 1 tablet by mouth daily. Takes 1-2 times daily as tolerated  . levETIRAcetam (KEPPRA XR) 500 MG 24 hr tablet Take 1 tablet (500 mg total) by mouth daily.  . promethazine (PHENERGAN) 25 MG tablet Take 25 mg by mouth every 6 (six) hours as needed.  Marland Kitchen tiZANidine (ZANAFLEX) 4 MG tablet TAKE 1 TABLET BY MOUTH EVERY 12 HOURS AS NEEDED FOR SPASM  . traMADol (ULTRAM) 50 MG tablet TAKE 1 TABLET BY MOUTH EVERY 6 HOURS AS NEEDED  . valACYclovir (VALTREX) 1000 MG tablet TAKE 1/2 TABLET BY MOUTH DAILY  . venlafaxine XR (EFFEXOR-XR) 150 MG 24 hr capsule Take 150 mg by mouth daily with breakfast.  . vitamin B-12 (CYANOCOBALAMIN) 1000 MCG tablet Take 1 tablet by mouth daily.  . [DISCONTINUED] venlafaxine XR (EFFEXOR-XR) 150 MG 24 hr capsule Take 1 capsule (150 mg total) by mouth 2 (two) times daily. Needs appointment for future refills.     ROS: Pertinent items noted in HPI and remainder of comprehensive ROS  otherwise negative.  Labs/Other Tests and Data Reviewed:    Recent Labs: 09/08/2018: Hemoglobin 11.7; Platelets 199 09/11/2018: BUN 74; Creatinine, Ser 4.79; Potassium 3.9; Sodium 142   Recent Lipid Panel No results found for: CHOL, TRIG, HDL, CHOLHDL, LDLCALC, LDLDIRECT  Wt Readings from Last 3 Encounters:  01/21/19 161 lb (73 kg)  12/18/18 180 lb (81.6 kg)  12/12/18 180 lb (81.6 kg)     Exam:    Vital Signs:  Wt 161 lb (73 kg)   BMI 32.52 kg/m    Exam deferred due to telephone visit  ASSESSMENT & PLAN:    1. Uncontrolled hypertension 2. Medication noncompliance 3. History of seizures 4. CKD 4 -medical renal disease  [name] has had uncontrolled  hypertension and history of medication noncompliance.  She also suffers from seizures and recently was admitted with severe headache secondary to uncontrolled hypertension and developed PRES. medications were adjusted however she discontinued amlodipine due to worsening edema and leg cramping.  She also was supposed to be on Lasix however stopped taking this as she felt like she did not have any issues with swelling and was not volume overloaded.  I suspect her blood pressure is elevated however she does not have a blood pressure cuff today and I encouraged her to unpack that and take blood pressures over the next week and to contact us with those results.  I may further adjust her medications including possibly adding hydralazine.  She would need to follow-up with Dr. Hollie Salk the nephrologist.  COVID-19 Education: The signs and symptoms of COVID-19 were discussed with the patient and how to seek care for testing (follow up with PCP or arrange E-visit).  The importance of social distancing was discussed today.  Patient Risk:   After full review of this patients clinical status, I feel that they are at least moderate risk at this time.  Time:   Today, I have spent 25 minutes with the patient with telehealth technology discussing  headaches, hypertension, medication compliance, seizures.     Medication Adjustments/Labs and Tests Ordered: Current medicines are reviewed at length with the patient today.  Concerns regarding medicines are outlined above.   Tests Ordered: No orders of the defined types were placed in this encounter.   Medication Changes: No orders of the defined types were placed in this encounter.   Disposition:  in 3 month(s)  Pixie Casino, MD, [facility], [place] Director of the Advanced Lipid Disorders &  Cardiovascular Risk Reduction Clinic Diplomate of the American Board of Clinical Lipidology Attending Cardiologist  Direct Dial: [phone]  Fax: [phone]  Website:  [email or web address]  Pixie Casino, MD  01/21/2019 9:04 AM

## 2019-02-18 ENCOUNTER — Ambulatory Visit: Payer: Medicare Other | Admitting: Internal Medicine

## 2019-02-19 ENCOUNTER — Telehealth: Payer: Self-pay | Admitting: Internal Medicine

## 2019-02-19 NOTE — Telephone Encounter (Signed)
Pt c/o BP issue: STAT if pt c/o blurred vision, one-sided weakness or slurred speech  1. What are your last 5 BP readings?  147/83  142/97  178/96 129/76 114/79   2. Are you having any other symptoms (ex. Dizziness, headache, blurred vision, passed out)? No  3. What is your BP issue? Patient states Dr. Debara Pickett requested patient to keep track of BP.

## 2019-02-19 NOTE — Telephone Encounter (Signed)
Called patient to advise of message from MD.  LVM advising patient, left call back number if questions.]

## 2019-02-19 NOTE — Telephone Encounter (Signed)
Thanks .. looks like BP is up and down. Would continue to monitor and continue current medicines.  Dr Lemmie Evens

## 2019-02-19 NOTE — Telephone Encounter (Signed)
Will route to MD to make aware of BP readings. Thank you!

## 2019-03-19 ENCOUNTER — Ambulatory Visit: Payer: Medicare Other | Admitting: Orthopaedic Surgery

## 2019-03-20 ENCOUNTER — Encounter: Payer: Self-pay | Admitting: Orthopaedic Surgery

## 2019-03-28 ENCOUNTER — Encounter: Payer: Self-pay | Admitting: Orthopaedic Surgery

## 2019-03-28 ENCOUNTER — Other Ambulatory Visit: Payer: Self-pay

## 2019-03-28 ENCOUNTER — Ambulatory Visit (INDEPENDENT_AMBULATORY_CARE_PROVIDER_SITE_OTHER): Payer: Medicare Other | Admitting: Orthopaedic Surgery

## 2019-03-28 ENCOUNTER — Ambulatory Visit: Payer: Medicare Other

## 2019-03-28 ENCOUNTER — Telehealth: Payer: Self-pay | Admitting: Orthopaedic Surgery

## 2019-03-28 VITALS — BP 154/112 | HR 85 | Temp 97.2°F | Ht 59.0 in | Wt 156.0 lb

## 2019-03-28 DIAGNOSIS — G8929 Other chronic pain: Secondary | ICD-10-CM | POA: Diagnosis not present

## 2019-03-28 DIAGNOSIS — M25561 Pain in right knee: Secondary | ICD-10-CM | POA: Diagnosis not present

## 2019-03-28 MED ORDER — TRAMADOL HCL 50 MG PO TABS: 50.0000 mg | ORAL_TABLET | Freq: Four times a day (QID) | ORAL | 4 refills | Status: DC | PRN

## 2019-03-28 NOTE — Telephone Encounter (Signed)
At time of check-out of today's office visit, 03/28/19, patient mentioned that she will also need to schedule an appointment for a new problem of shoulder pain. Said she is wanting to get this done before Dr Matilde Haymaker. Discussed holding until after her follow up from MRI, and I offered to set it up. Patient then said that's okay, she'll just do it next time, and left.

## 2019-03-28 NOTE — Progress Notes (Signed)
Patient [name], female DOB:24-Nov-1965, 54 y.o. [id number]  Chief Complaint  Patient presents with  . Knee Pain    R/hurting since 03/25/19    HPI  [name] is a 54 y.o. female who has developed pain in the right knee which has gotten worse. She has had pain on and off for years. This past Tuesday (today is Thursday) she awoke with pain in the right knee and could not bear weight.  It had some swelling and more medial pain.  She took her pain medicine and used heat, ice and rubs and was able to stand and walk some.  The knee wanted to give way. She had no redness, no trauma, no distal edema. She is not improving.   Body mass index is 31.51 kg/m.  ROS  Review of Systems  HENT: Negative for congestion.   Respiratory: Negative for cough and shortness of breath.   Cardiovascular: Negative for chest pain and leg swelling.  Endocrine: Positive for cold intolerance.  Musculoskeletal: Positive for arthralgias.  Allergic/Immunologic: Positive for environmental allergies.  All other systems reviewed and are negative.   All other systems reviewed and are negative.  The following is a summary of the past history medically, past history surgically, known current medicines, social history and family history.  This information is gathered electronically by the computer from prior information and documentation.  I review this each visit and have found including this information at this point in the chart is beneficial and informative.    Past Medical History:  Diagnosis Date  . Anemia   . Anxiety   . Bipolar disorder ([place])   . Depression   . History of blood transfusion   . Hypertension   . IUD    HISTORY OF IUD --REMOVED IN 2006  . Seizures ([place]) [id number]   two seizures due to a med changes    Past Surgical History:  Procedure Laterality Date  . CARPAL TUNNEL RELEASE Right 07/08/2014   Procedure: RIGHT CARPAL TUNNEL RELEASE;  Surgeon: Sanjuana Kava, MD;   Location: [facility];  Service: Orthopedics;  Laterality: Right;  . CHOLECYSTECTOMY    . GASTRIC BYPASS  2000  . HYSTEROSCOPY WITH D & C  11/14/2011   Procedure: DILATATION AND CURETTAGE /HYSTEROSCOPY;  Surgeon: Marylynn Pearson, MD;  Location: [facility];  Service: Gynecology;;  with removal of polyps  . INTRAUTERINE DEVICE (IUD) INSERTION  03/21/2010  . LAPAROSCOPIC GASTROTOMY W/ REPAIR OF ULCER    . PANNICULECTOMY    . ROTATOR CUFF REPAIR      Family History  Problem Relation Age of Onset  . Hypertension Mother   . Kidney failure Mother   . Hypertension Father   . Diabetes Cousin   . Diabetes Sister   . Hypertension Sister   . Kidney disease Sister        dialysis  . Hypertension Brother   . Heart attack Brother   . Hypertension Maternal Aunt   . Hypertension Maternal Uncle   . Hypertension Maternal Grandmother   . Hypertension Maternal Grandfather   . Hypertension Paternal Grandmother   . Hypertension Paternal Grandfather   . Heart attack Brother   . Hypertension Brother   . Hypertension Sister     Social History Social History   Tobacco Use  . Smoking status: Former Smoker    Packs/day: 0.25    Years: 4.00    Pack years: 1.00    Types: Cigarettes    Quit date: 03/21/1988  Years since quitting: 31.0  . Smokeless tobacco: Never Used  Substance Use Topics  . Alcohol use: Yes    Comment: 1-2 times a year.   . Drug use: No    Allergies  Allergen Reactions  . Ambien [Zolpidem Tartrate]     "BLACK OUT", SLEEP DRIVING  . Geodon [Ziprasidone Hcl]     Seizure activity  . Lactulose Itching  . Ondansetron Nausea And Vomiting  . Quetiapine     Other reaction(s): Other (See Comments) Possible Hair Loss  . Sulfa Antibiotics Rash    Current Outpatient Medications  Medication Sig Dispense Refill  . acetaminophen (TYLENOL 8 HOUR) 650 MG CR tablet Take 650 mg by mouth every 8 (eight) hours as needed for pain.    Marland Kitchen aspirin EC 81 MG tablet Take 81 mg by mouth daily.    .  carvedilol (COREG) 25 MG tablet Take 25 mg by mouth 2 (two) times daily with a meal.    . Ferrous Fumarate (HEMOCYTE - 106 MG FE) 324 (106 Fe) MG TABS tablet Take 1 tablet by mouth daily. Takes 1-2 times daily as tolerated    . furosemide (LASIX) 80 MG tablet Take 1 tablet (80 mg total) by mouth daily. (Patient not taking: Reported on 01/21/2019) 30 tablet 1  . levETIRAcetam (KEPPRA XR) 500 MG 24 hr tablet Take 1 tablet (500 mg total) by mouth daily. 90 tablet 3  . promethazine (PHENERGAN) 25 MG tablet Take 25 mg by mouth every 6 (six) hours as needed.    Marland Kitchen tiZANidine (ZANAFLEX) 4 MG tablet TAKE 1 TABLET BY MOUTH EVERY 12 HOURS AS NEEDED FOR SPASM 60 tablet 4  . traMADol (ULTRAM) 50 MG tablet Take 1 tablet (50 mg total) by mouth every 6 (six) hours as needed. 90 tablet 4  . valACYclovir (VALTREX) 1000 MG tablet TAKE 1/2 TABLET BY MOUTH DAILY 30 tablet 5  . venlafaxine XR (EFFEXOR-XR) 150 MG 24 hr capsule Take 150 mg by mouth daily with breakfast.    . vitamin B-12 (CYANOCOBALAMIN) 1000 MCG tablet Take 1 tablet by mouth daily.     No current facility-administered medications for this visit.     Physical Exam  Blood pressure (!) 154/112, pulse 85, temperature (!) 97.2 F (36.2 C), height 4\' 11"  (1.499 m), weight 156 lb (70.8 kg).  Constitutional: overall normal hygiene, normal nutrition, well developed, normal grooming, normal body habitus. Assistive device:cane  Musculoskeletal: gait and station Limp right, muscle tone and strength are normal, no tremors or atrophy is present.  .  Neurological: coordination overall normal.  Deep tendon reflex/nerve stretch intact.  Sensation normal.  Cranial nerves II-XII intact.   Skin:   Normal overall no scars, lesions, ulcers or rashes. No psoriasis.  Psychiatric: Alert and oriented x 3.  Recent memory intact, remote memory unclear.  Normal mood and affect. Well groomed.  Good eye contact.  Cardiovascular: overall no swelling, no varicosities, no  edema bilaterally, normal temperatures of the legs and arms, no clubbing, cyanosis and good capillary refill.  Lymphatic: palpation is normal.  Right knee is tender medially, slight effusion, ROM 0 to 100 with pain, limp right, positive medial [place], [place] intact.  All other systems reviewed and are negative   The patient has been educated about the nature of the problem(s) and counseled on treatment options.  The patient appeared to understand what I have discussed and is in agreement with it.  Encounter Diagnosis  Name Primary?  . Chronic pain of right  knee Yes   X-rays were done of the right knee, reported separately.  PROCEDURE NOTE:  The patient requests injections of the right knee , verbal consent was obtained.  The right knee was prepped appropriately after time out was performed.   Sterile technique was observed and injection of 1 cc of Depo-Medrol 40 mg with several cc's of plain xylocaine. Anesthesia was provided by ethyl chloride and a 20-gauge needle was used to inject the knee area. The injection was tolerated well.  A band aid dressing was applied.  The patient was advised to apply ice later today and tomorrow to the injection sight as needed.  PLAN Call if any problems.  Precautions discussed.  Continue current medications.   Return to clinic 2 weeks   Get MRI of the right knee.  I am concerned about medial meniscus tear.  I have refilled her pain medicine.  I have reviewed the [place] web site prior to prescribing narcotic medicine for this patient.   Electronically Signed Sanjuana Kava, MD 1/7/20218:50 AM

## 2019-04-04 ENCOUNTER — Other Ambulatory Visit: Payer: Self-pay

## 2019-04-04 ENCOUNTER — Ambulatory Visit (HOSPITAL_COMMUNITY)
Admission: RE | Admit: 2019-04-04 | Discharge: 2019-04-04 | Disposition: A | Discharge status: Home / Self Care | Payer: Medicare Other | Source: Ambulatory Visit | Attending: Orthopaedic Surgery | Admitting: Orthopaedic Surgery

## 2019-04-04 DIAGNOSIS — M25561 Pain in right knee: Secondary | ICD-10-CM | POA: Diagnosis not present

## 2019-04-04 DIAGNOSIS — G8929 Other chronic pain: Secondary | ICD-10-CM | POA: Diagnosis not present

## 2019-04-08 ENCOUNTER — Telehealth: Payer: Self-pay | Admitting: Orthopaedic Surgery

## 2019-04-08 NOTE — Telephone Encounter (Signed)
OK.  Reasonable.

## 2019-04-08 NOTE — Telephone Encounter (Signed)
Patient called to relay that she will need to change her visit from in-person to virtual for her MRI review. States she is a long-time patient of Dr Luna Glasgow, and cannot come at this time in-person. We have therefore moved tomorrow's appointment to a virtual visit on Wednesday, 04/10/19, at her request.

## 2019-04-09 ENCOUNTER — Ambulatory Visit: Payer: Medicare Other | Admitting: Orthopaedic Surgery

## 2019-04-09 NOTE — Telephone Encounter (Signed)
Patient aware.

## 2019-04-10 ENCOUNTER — Other Ambulatory Visit: Payer: Self-pay

## 2019-04-10 ENCOUNTER — Encounter: Payer: Self-pay | Admitting: Orthopaedic Surgery

## 2019-04-10 ENCOUNTER — Ambulatory Visit (INDEPENDENT_AMBULATORY_CARE_PROVIDER_SITE_OTHER): Payer: Medicare Other | Admitting: Orthopaedic Surgery

## 2019-04-10 DIAGNOSIS — M25561 Pain in right knee: Secondary | ICD-10-CM | POA: Diagnosis not present

## 2019-04-10 DIAGNOSIS — M1711 Unilateral primary osteoarthritis, right knee: Secondary | ICD-10-CM | POA: Diagnosis not present

## 2019-04-10 DIAGNOSIS — G8929 Other chronic pain: Secondary | ICD-10-CM

## 2019-04-10 NOTE — Progress Notes (Signed)
Virtual Visit via Telephone Note  I connected with@ on 04/10/19 at 10:30 AM EST by telephone and verified that I am speaking with the correct person using two identifiers.  Location: Patient: home Provider: [facility]   I discussed the limitations, risks, security and privacy concerns of performing an evaluation and management service by telephone and the availability of in person appointments. I also discussed with the patient that there may be a patient responsible charge related to this service. The patient expressed understanding and agreed to proceed.   History of Present Illness: I told her the results of the MRI of the knee on the right.  It showed: IMPRESSION: Dominant finding is severe tricompartmental osteoarthritis. Associated tearing of the medial and lateral menisci is identified as described above.  I have suggested she see Dr. Aline Brochure about this and discuss possible surgery.  She would rather wait and talk to me in the office on February 11th.  That is agreeable to me.    Observations/Objective: Per above.  Assessment and Plan: Encounter Diagnosis  Name Primary?  . Chronic pain of right knee Yes     Follow Up Instructions: To be seen February 11th per her choice.   I discussed the assessment and treatment plan with the patient. The patient was provided an opportunity to ask questions and all were answered. The patient agreed with the plan and demonstrated an understanding of the instructions.   The patient was advised to call back or seek an in-person evaluation if the symptoms worsen or if the condition fails to improve as anticipated.  I provided 8 minutes of non-face-to-face time during this encounter.   Sanjuana Kava, MD

## 2019-05-02 ENCOUNTER — Other Ambulatory Visit: Payer: Self-pay

## 2019-05-02 ENCOUNTER — Encounter: Payer: Self-pay | Admitting: Orthopaedic Surgery

## 2019-05-02 ENCOUNTER — Ambulatory Visit (INDEPENDENT_AMBULATORY_CARE_PROVIDER_SITE_OTHER): Payer: Medicare Other | Admitting: Orthopaedic Surgery

## 2019-05-02 VITALS — BP 152/102 | HR 77 | Ht 59.0 in | Wt 170.0 lb

## 2019-05-02 DIAGNOSIS — N2581 Secondary hyperparathyroidism of renal origin: Secondary | ICD-10-CM | POA: Diagnosis not present

## 2019-05-02 DIAGNOSIS — E872 Acidosis: Secondary | ICD-10-CM | POA: Diagnosis not present

## 2019-05-02 DIAGNOSIS — M25512 Pain in left shoulder: Secondary | ICD-10-CM

## 2019-05-02 DIAGNOSIS — D631 Anemia in chronic kidney disease: Secondary | ICD-10-CM | POA: Diagnosis not present

## 2019-05-02 DIAGNOSIS — N185 Chronic kidney disease, stage 5: Secondary | ICD-10-CM | POA: Diagnosis not present

## 2019-05-02 DIAGNOSIS — G8929 Other chronic pain: Secondary | ICD-10-CM | POA: Diagnosis not present

## 2019-05-02 DIAGNOSIS — N189 Chronic kidney disease, unspecified: Secondary | ICD-10-CM | POA: Diagnosis not present

## 2019-05-02 DIAGNOSIS — I120 Hypertensive chronic kidney disease with stage 5 chronic kidney disease or end stage renal disease: Secondary | ICD-10-CM | POA: Diagnosis not present

## 2019-05-02 MED ORDER — TIZANIDINE HCL 4 MG PO TABS: ORAL_TABLET | ORAL | 4 refills | Status: DC

## 2019-05-02 MED ORDER — TRAMADOL HCL 50 MG PO TABS: 50.0000 mg | ORAL_TABLET | Freq: Four times a day (QID) | ORAL | 4 refills | Status: DC | PRN

## 2019-05-02 NOTE — Progress Notes (Signed)
PROCEDURE NOTE:  The patient request injection, verbal consent was obtained.  The left shoulder was prepped appropriately after time out was performed.   Sterile technique was observed and injection of 1 cc of Depo-Medrol 40 mg with several cc's of plain xylocaine. Anesthesia was provided by ethyl chloride and a 20-gauge needle was used to inject the shoulder area. A posterior approach was used.  The injection was tolerated well.  A band aid dressing was applied.  The patient was advised to apply ice later today and tomorrow to the injection sight as needed.  I have reviewed the [facility] web site prior to prescribing narcotic medicine for this patient.   Return in six weeks.  She wants to wait on the knee surgery.  Call if any problem.  Precautions discussed.   Electronically Signed Sanjuana Kava, MD 2/11/202110:16 AM

## 2019-05-09 ENCOUNTER — Other Ambulatory Visit: Payer: Self-pay | Admitting: Orthopaedic Surgery

## 2019-05-10 NOTE — Telephone Encounter (Signed)
Rx request 

## 2019-05-30 ENCOUNTER — Telehealth: Payer: Self-pay | Admitting: Neurology

## 2019-05-30 ENCOUNTER — Other Ambulatory Visit: Payer: Self-pay

## 2019-05-30 MED ORDER — LEVETIRACETAM ER 500 MG PO TB24: 500.0000 mg | ORAL_TABLET | Freq: Every day | ORAL | 3 refills | Status: DC

## 2019-05-30 NOTE — Telephone Encounter (Signed)
Patient is needing a refill on the levetiracetam medication to [facility] in [place]. They are needing a new script. Thanks!

## 2019-06-06 ENCOUNTER — Telehealth: Payer: Self-pay | Admitting: Neurology

## 2019-06-06 NOTE — Telephone Encounter (Signed)
Pt called back to see what medication she took and to see how she was feeling, no answer, voice mail was left for pt to call back.

## 2019-06-06 NOTE — Telephone Encounter (Signed)
Pt called back no answer voice mail left for her to call the office

## 2019-06-06 NOTE — Telephone Encounter (Signed)
Per Dr. Delice Lesch she may just feel a little sleepy with additional dose, but would go back to taking dose as prescribed of Keppra

## 2019-06-06 NOTE — Telephone Encounter (Signed)
Left message with after hour service on 06-05-19 5:08 pm  Caller states that she has appt on 07-02-19 but she is out of her medication she was given a 90 day supply but she somehow took more than what she was suppose to take she got it mixed up with another medication. They will not refill the medication because it is too early. The pharmacy suggested rewrite the RX

## 2019-06-07 NOTE — Telephone Encounter (Signed)
Pt called back no answer voice mail left for her to call back to follow up on how she is doing after mixing up her medication,

## 2019-06-13 ENCOUNTER — Ambulatory Visit: Payer: Medicare Other | Admitting: Orthopaedic Surgery

## 2019-06-25 ENCOUNTER — Ambulatory Visit: Payer: Medicare Other | Admitting: Orthopaedic Surgery

## 2019-07-02 ENCOUNTER — Ambulatory Visit: Payer: Medicare Other | Admitting: Neurology

## 2019-07-19 ENCOUNTER — Emergency Department (HOSPITAL_COMMUNITY): Payer: Medicare Other

## 2019-07-19 ENCOUNTER — Other Ambulatory Visit: Payer: Self-pay

## 2019-07-19 ENCOUNTER — Inpatient Hospital Stay (HOSPITAL_COMMUNITY)
Admission: EM | Admit: 2019-07-19 | Discharge: 2019-07-25 | DRG: 682 | Disposition: A | Discharge status: Home Health | Payer: Medicare Other | Attending: Family Medicine | Admitting: Family Medicine

## 2019-07-19 ENCOUNTER — Encounter (HOSPITAL_COMMUNITY): Payer: Self-pay | Admitting: Emergency Medicine

## 2019-07-19 DIAGNOSIS — Z8249 Family history of ischemic heart disease and other diseases of the circulatory system: Secondary | ICD-10-CM

## 2019-07-19 DIAGNOSIS — F319 Bipolar disorder, unspecified: Secondary | ICD-10-CM | POA: Diagnosis present

## 2019-07-19 DIAGNOSIS — G9341 Metabolic encephalopathy: Secondary | ICD-10-CM | POA: Diagnosis not present

## 2019-07-19 DIAGNOSIS — Z7982 Long term (current) use of aspirin: Secondary | ICD-10-CM

## 2019-07-19 DIAGNOSIS — Z20822 Contact with and (suspected) exposure to covid-19: Secondary | ICD-10-CM | POA: Diagnosis present

## 2019-07-19 DIAGNOSIS — R569 Unspecified convulsions: Principal | ICD-10-CM

## 2019-07-19 DIAGNOSIS — E872 Acidosis: Secondary | ICD-10-CM | POA: Diagnosis not present

## 2019-07-19 DIAGNOSIS — G40909 Epilepsy, unspecified, not intractable, without status epilepticus: Secondary | ICD-10-CM | POA: Diagnosis present

## 2019-07-19 DIAGNOSIS — Z66 Do not resuscitate: Secondary | ICD-10-CM

## 2019-07-19 DIAGNOSIS — N184 Chronic kidney disease, stage 4 (severe): Secondary | ICD-10-CM | POA: Diagnosis present

## 2019-07-19 DIAGNOSIS — Z841 Family history of disorders of kidney and ureter: Secondary | ICD-10-CM

## 2019-07-19 DIAGNOSIS — N179 Acute kidney failure, unspecified: Secondary | ICD-10-CM | POA: Diagnosis not present

## 2019-07-19 DIAGNOSIS — D518 Other vitamin B12 deficiency anemias: Secondary | ICD-10-CM | POA: Diagnosis present

## 2019-07-19 DIAGNOSIS — Z9884 Bariatric surgery status: Secondary | ICD-10-CM

## 2019-07-19 DIAGNOSIS — N185 Chronic kidney disease, stage 5: Secondary | ICD-10-CM | POA: Diagnosis present

## 2019-07-19 DIAGNOSIS — Z515 Encounter for palliative care: Secondary | ICD-10-CM | POA: Diagnosis not present

## 2019-07-19 DIAGNOSIS — E559 Vitamin D deficiency, unspecified: Secondary | ICD-10-CM | POA: Diagnosis present

## 2019-07-19 DIAGNOSIS — A6004 Herpesviral vulvovaginitis: Secondary | ICD-10-CM | POA: Diagnosis present

## 2019-07-19 DIAGNOSIS — Z833 Family history of diabetes mellitus: Secondary | ICD-10-CM

## 2019-07-19 DIAGNOSIS — Z79899 Other long term (current) drug therapy: Secondary | ICD-10-CM

## 2019-07-19 DIAGNOSIS — D519 Vitamin B12 deficiency anemia, unspecified: Secondary | ICD-10-CM | POA: Diagnosis present

## 2019-07-19 DIAGNOSIS — Z7189 Other specified counseling: Secondary | ICD-10-CM

## 2019-07-19 DIAGNOSIS — I120 Hypertensive chronic kidney disease with stage 5 chronic kidney disease or end stage renal disease: Secondary | ICD-10-CM | POA: Diagnosis present

## 2019-07-19 DIAGNOSIS — Z79891 Long term (current) use of opiate analgesic: Secondary | ICD-10-CM

## 2019-07-19 DIAGNOSIS — E538 Deficiency of other specified B group vitamins: Secondary | ICD-10-CM | POA: Diagnosis present

## 2019-07-19 DIAGNOSIS — Z87891 Personal history of nicotine dependence: Secondary | ICD-10-CM

## 2019-07-19 LAB — CBC WITH DIFFERENTIAL/PLATELET
Abs Immature Granulocytes: 0.01 10*3/uL (ref 0.00–0.07)
Basophils Absolute: 0 10*3/uL (ref 0.0–0.1)
Basophils Relative: 0 %
Eosinophils Absolute: 0 10*3/uL (ref 0.0–0.5)
Eosinophils Relative: 0 %
HCT: 27.5 % — ABNORMAL LOW (ref 36.0–46.0)
Hemoglobin: 8.5 g/dL — ABNORMAL LOW (ref 12.0–15.0)
Immature Granulocytes: 0 %
Lymphocytes Relative: 15 %
Lymphs Abs: 0.9 10*3/uL (ref 0.7–4.0)
MCH: 29.4 pg (ref 26.0–34.0)
MCHC: 30.9 g/dL (ref 30.0–36.0)
MCV: 95.2 fL (ref 80.0–100.0)
Monocytes Absolute: 0.7 10*3/uL (ref 0.1–1.0)
Monocytes Relative: 12 %
Neutro Abs: 4.2 10*3/uL (ref 1.7–7.7)
Neutrophils Relative %: 73 %
Platelets: 164 10*3/uL (ref 150–400)
RBC: 2.89 MIL/uL — ABNORMAL LOW (ref 3.87–5.11)
RDW: 13.7 % (ref 11.5–15.5)
WBC: 5.8 10*3/uL (ref 4.0–10.5)
nRBC: 0 % (ref 0.0–0.2)

## 2019-07-19 LAB — COMPREHENSIVE METABOLIC PANEL
ALT: 15 U/L (ref 0–44)
AST: 29 U/L (ref 15–41)
Albumin: 3.3 g/dL — ABNORMAL LOW (ref 3.5–5.0)
Alkaline Phosphatase: 122 U/L (ref 38–126)
Anion gap: 15 (ref 5–15)
BUN: 98 mg/dL — ABNORMAL HIGH (ref 6–20)
CO2: 17 mmol/L — ABNORMAL LOW (ref 22–32)
Calcium: 5.4 mg/dL — CL (ref 8.9–10.3)
Chloride: 103 mmol/L (ref 98–111)
Creatinine, Ser: 7.21 mg/dL — ABNORMAL HIGH (ref 0.44–1.00)
GFR calc Af Amer: 7 mL/min — ABNORMAL LOW (ref >60)
GFR calc non Af Amer: 6 mL/min — ABNORMAL LOW (ref >60)
Glucose, Bld: 86 mg/dL (ref 70–99)
Potassium: 3.6 mmol/L (ref 3.5–5.1)
Sodium: 135 mmol/L (ref 135–145)
Total Bilirubin: 0.8 mg/dL (ref 0.3–1.2)
Total Protein: 6.6 g/dL (ref 6.5–8.1)

## 2019-07-19 LAB — CK: Total CK: 1963 U/L — ABNORMAL HIGH (ref 38–234)

## 2019-07-19 MED ORDER — SODIUM CHLORIDE 0.9 % IV BOLUS
1000.0000 mL | Freq: Once | INTRAVENOUS | Status: AC
  Administered 2019-07-19: 1000 mL via INTRAVENOUS

## 2019-07-19 MED ORDER — LEVETIRACETAM IN NACL 500 MG/100ML IV SOLN
500.0000 mg | Freq: Once | INTRAVENOUS | Status: AC
  Administered 2019-07-19: 500 mg via INTRAVENOUS
  Filled 2019-07-19 (×2): qty 100

## 2019-07-19 NOTE — ED Notes (Signed)
Pt reports she is unsure if she is taking her medications like she should. Pt reports not know about her Keppra and why she takes it. Pt A+O X 4.

## 2019-07-19 NOTE — ED Notes (Signed)
Date and time results received: 07/19/19 2128   Test: Calcium Critical Value: 5.4  Name of Provider Notified: Roderic Palau, MD

## 2019-07-19 NOTE — ED Notes (Signed)
Pt provided beverage and meal tray

## 2019-07-19 NOTE — ED Provider Notes (Signed)
[facility] EMERGENCY DEPARTMENT Provider Note   CSN: [id number] Arrival date & time: 07/19/19  1837     History Chief Complaint  Patient presents with  . Altered Mental Status    [name] is a 54 y.o. female.  Patient had a seizure today.  She also has a history of hypertension and renal disease.  Patient takes [place] for her seizures  The history is provided by the patient and the EMS personnel. No language interpreter was used.  Altered Mental Status Presenting symptoms: disorientation   Severity:  Mild Most recent episode:  Today Episode history:  Single Timing:  Rare Progression:  Resolved Chronicity:  Recurrent Context: not alcohol use   Associated symptoms: no abdominal pain        Past Medical History:  Diagnosis Date  . Anemia   . Anxiety   . Bipolar disorder ([place])   . Depression   . History of blood transfusion   . Hypertension   . IUD    HISTORY OF IUD --REMOVED IN 2006  . Seizures ([place]) [id number]   two seizures due to a med changes    Patient Active Problem List   Diagnosis Date Noted  . Acute renal failure superimposed on stage 4 chronic kidney disease ([place]) 09/10/2018  . Hypertensive urgency, malignant   . Acute renal failure superimposed on chronic kidney disease ([place])   . Hypertensive emergency 09/08/2018  . Acute renal injury ([place]) 09/08/2018  . Alkaline phosphatase elevation 03/29/2018  . B12 deficiency 03/29/2018  . Vitamin D deficiency 03/29/2018  . History of herpes genitalis 03/22/2018  . Obesity (BMI 30.0-34.9) 03/22/2018  . Osteoarthritis 03/22/2018  . Seizure ([place]) 07/06/2017  . Noncompliance with medication regimen 04/27/2017  . Vasovagal syncope 03/29/2017  . Rotator cuff arthropathy of left shoulder 07/07/2016  . S/P shoulder surgery 07/07/2016  . LGSIL on Pap smear of cervix 04/26/2016  . Herpes simplex vulvovaginitis 03/01/2016  . Lower extremity edema 07/20/2015  . Chronic kidney disease, stage IV (severe)  ([place]) 05/14/2015  . Proteinuria 05/14/2015  . Generalized headaches 01/05/2015  . Weight gain following gastric bypass surgery 08/13/2014  . Deficiency anemia 01/06/2014  . Iron deficiency anemia 01/06/2014  . Vitamin B12 deficiency (dietary) anemia 01/06/2014  . Essential hypertension, benign 01/25/2013  . Bipolar disorder [facility])     Past Surgical History:  Procedure Laterality Date  . CARPAL TUNNEL RELEASE Right 07/08/2014   Procedure: RIGHT CARPAL TUNNEL RELEASE;  Surgeon: Sanjuana Kava, MD;  Location: [facility];  Service: Orthopedics;  Laterality: Right;  . CHOLECYSTECTOMY    . GASTRIC BYPASS  2000  . HYSTEROSCOPY WITH D & C  11/14/2011   Procedure: DILATATION AND CURETTAGE /HYSTEROSCOPY;  Surgeon: Marylynn Pearson, MD;  Location: [facility];  Service: Gynecology;;  with removal of polyps  . INTRAUTERINE DEVICE (IUD) INSERTION  03/21/2010  . LAPAROSCOPIC GASTROTOMY W/ REPAIR OF ULCER    . PANNICULECTOMY    . ROTATOR CUFF REPAIR       OB History    Gravida  1   Para      Term      Preterm      AB  1   Living  0     SAB  1   TAB      Ectopic      Multiple      Live Births              Family History  Problem Relation Age of Onset  .  Hypertension Mother   . Kidney failure Mother   . Hypertension Father   . Diabetes Cousin   . Diabetes Sister   . Hypertension Sister   . Kidney disease Sister        dialysis  . Hypertension Brother   . Heart attack Brother   . Hypertension Maternal Aunt   . Hypertension Maternal Uncle   . Hypertension Maternal Grandmother   . Hypertension Maternal Grandfather   . Hypertension Paternal Grandmother   . Hypertension Paternal Grandfather   . Heart attack Brother   . Hypertension Brother   . Hypertension Sister     Social History   Tobacco Use  . Smoking status: Former Smoker    Packs/day: 0.25    Years: 4.00    Pack years: 1.00    Types: Cigarettes    Quit date: 03/21/1988    Years since quitting: 31.3  . Smokeless  tobacco: Never Used  Substance Use Topics  . Alcohol use: Yes    Comment: 1-2 times a year.   . Drug use: No    Home Medications Prior to Admission medications   Medication Sig Start Date End Date Taking? Authorizing Provider  tiZANidine (ZANAFLEX) 4 MG tablet TAKE 1 TABLET BY MOUTH EVERY 12 HOURS AS NEEDED FOR SPASM 05/10/19   Sanjuana Kava, MD  acetaminophen (TYLENOL 8 HOUR) 650 MG CR tablet Take 650 mg by mouth every 8 (eight) hours as needed for pain.    [provider]  aspirin EC 81 MG tablet Take 81 mg by mouth daily.    [provider]  carvedilol (COREG) 25 MG tablet Take 25 mg by mouth 2 (two) times daily with a meal.    [provider]  Ferrous Fumarate (HEMOCYTE - 106 MG FE) 324 (106 Fe) MG TABS tablet Take 1 tablet by mouth daily. Takes 1-2 times daily as tolerated 03/29/18   [provider]  furosemide (LASIX) 80 MG tablet Take 1 tablet (80 mg total) by mouth daily. 09/12/18   Orson Eva, MD  levETIRAcetam (KEPPRA XR) 500 MG 24 hr tablet Take 1 tablet (500 mg total) by mouth daily. 05/30/19   Cameron Sprang, MD  promethazine (PHENERGAN) 25 MG tablet Take 25 mg by mouth every 6 (six) hours as needed. 01/01/19   [provider]  traMADol (ULTRAM) 50 MG tablet Take 1 tablet (50 mg total) by mouth every 6 (six) hours as needed. 05/02/19   Sanjuana Kava, MD  valACYclovir (VALTREX) 1000 MG tablet TAKE 1/2 TABLET BY MOUTH DAILY 10/22/18   Donnamae Jude, MD  venlafaxine XR (EFFEXOR-XR) 150 MG 24 hr capsule Take 150 mg by mouth daily with breakfast.    [provider]  vitamin B-12 (CYANOCOBALAMIN) 1000 MCG tablet Take 1 tablet by mouth daily. 10/01/18   [provider]    Allergies    Ambien [zolpidem tartrate], Geodon [ziprasidone hcl], Lactulose, Ondansetron, Quetiapine, Sulfa antibiotics, and Zolpidem  Review of Systems   Review of Systems  Unable to perform ROS: Mental status change  Gastrointestinal: Negative for  abdominal pain.    Physical Exam Updated Vital Signs BP (!) 148/99 (BP Location: Left Arm)   Pulse 88   Temp 98.6 F (37 C) (Oral)   Resp 16   Ht 4\' 11"  (1.499 m)   Wt 77.1 kg   SpO2 100%   BMI 34.33 kg/m   Physical Exam Vitals and nursing note reviewed.  Constitutional:      Appearance: She  is well-developed.  HENT:     Head: Normocephalic.     Nose: Nose normal.  Eyes:     General: No scleral icterus.    Conjunctiva/sclera: Conjunctivae normal.  Neck:     Thyroid: No thyromegaly.  Cardiovascular:     Rate and Rhythm: Normal rate and regular rhythm.     Heart sounds: No murmur. No friction rub. No gallop.   Pulmonary:     Breath sounds: No stridor. No wheezing or rales.  Chest:     Chest wall: No tenderness.  Abdominal:     General: There is no distension.     Tenderness: There is no abdominal tenderness. There is no rebound.  Musculoskeletal:        General: Normal range of motion.     Cervical back: Neck supple.  Lymphadenopathy:     Cervical: No cervical adenopathy.  Skin:    Findings: No erythema or rash.  Neurological:     Mental Status: She is alert.     Motor: No abnormal muscle tone.     Coordination: Coordination normal.     Comments: Oriented to person only  Psychiatric:        Behavior: Behavior normal.     ED Results / Procedures / Treatments   Labs (all labs ordered are listed, but only abnormal results are displayed) Labs Reviewed  CBC WITH DIFFERENTIAL/PLATELET - Abnormal; Notable for the following components:      Result Value   RBC 2.89 (*)    Hemoglobin 8.5 (*)    HCT 27.5 (*)    All other components within normal limits  COMPREHENSIVE METABOLIC PANEL - Abnormal; Notable for the following components:   CO2 17 (*)    BUN 98 (*)    Creatinine, Ser 7.21 (*)    Calcium 5.4 (*)    Albumin 3.3 (*)    GFR calc non Af Amer 6 (*)    GFR calc Af Amer 7 (*)    All other components within normal limits  CK - Abnormal; Notable for the  following components:   Total CK 1,963 (*)    All other components within normal limits  RESPIRATORY PANEL BY RT PCR (FLU A&B, COVID)    EKG EKG Interpretation  Date/Time:  Friday July 19 2019 20:59:30 EDT Ventricular Rate:  86 PR Interval:  184 QRS Duration: 82 QT Interval:  438 QTC Calculation: 524 R Axis:     Text Interpretation: Normal sinus rhythm Cannot rule out Anterior infarct , age undetermined Prolonged QT Abnormal ECG Confirmed by Milton Ferguson [phone]) on 07/19/2019 11:17:36 PM   Radiology CT Head Wo Contrast  Result Date: 07/19/2019 CLINICAL DATA:  Altered mental status. EXAM: CT HEAD WITHOUT CONTRAST TECHNIQUE: Contiguous axial images were obtained from the base of the skull through the vertex without intravenous contrast. COMPARISON:  September 08, 2018 FINDINGS: Brain: No evidence of acute infarction, hemorrhage, hydrocephalus, extra-axial collection or mass lesion/mass effect. Stable extensive white matter hypoattenuation consistent with advanced small vessel disease. Vascular: No hyperdense vessel or unexpected calcification. Skull: Normal. Negative for fracture or focal lesion. Sinuses/Orbits: No acute finding. Other: None. IMPRESSION: No acute intracranial pathology. Electronically Signed   By: Virgina Norfolk M.D.   On: 07/19/2019 22:19    Procedures Procedures (including critical care time)  Medications Ordered in ED Medications  levETIRAcetam (KEPPRA) IVPB 500 mg/100 mL premix (0 mg Intravenous Stopped 07/19/19 2059)  sodium chloride 0.9 % bolus 1,000 mL (1,000 mLs Intravenous New Bag/Given  07/19/19 2300)   CRITICAL CARE Performed by: Milton Ferguson Total critical care time: 40 minutes Critical care time was exclusive of separately billable procedures and treating other patients. Critical care was necessary to treat or prevent imminent or life-threatening deterioration. Critical care was time spent personally by me on the following activities: development of  treatment plan with patient and/or surrogate as well as nursing, discussions with consultants, evaluation of patient's response to treatment, examination of patient, obtaining history from patient or surrogate, ordering and performing treatments and interventions, ordering and review of laboratory studies, ordering and review of radiographic studies, pulse oximetry and re-evaluation of patient's condition.  ED Course  I have reviewed the triage vital signs and the nursing notes.  Pertinent labs & imaging results that were available during my care of the patient were reviewed by me and considered in my medical decision making (see chart for details).    MDM Rules/Calculators/A&P                      Patient with seizures along with hypocalcemia and worsening renal disease with rhabdo.  She will be given to fluids and admitted to medicine    This patient presents to the ED for concern of seizure this involves an extensive number of treatment options, and is a complaint that carries with it a high risk of complications and morbidity.  The differential diagnosis includes seizure disorder metabolic problem   Lab Tests:   I Ordered, reviewed, and interpreted labs, which included CBC chemistries CK results showed patient to be anemic hypocalcemic and elevated CK for rhabdo  Medicines ordered:   I ordered medication normal saline for rhabdo.  Patient also given Keppra for seizures  Imaging Studies ordered:   I ordered imaging studies which included CT head and  I independently visualized and interpreted imaging which showed no acute disease  Additional history obtained:   Additional history obtained from EMS  Previous records obtained and reviewed   Consultations Obtained:   I consulted hospitalist and discussed lab and imaging findings  Reevaluation:  After the interventions stated above, I reevaluated the patient and found more oriented than initial presentation  Critical  Interventions:  .   Final Clinical Impression(s) / ED Diagnoses Final diagnoses:  Seizure [facility])    Rx / DC Orders ED Discharge Orders    None       Milton Ferguson, MD 07/21/19 [phone]

## 2019-07-19 NOTE — ED Triage Notes (Signed)
EMS called for AMS.  EMS responder reports pt has history of seizures. EMS sould not confirm seizure but pt was "coming around."  Denies any pain at this time.

## 2019-07-19 NOTE — ED Notes (Signed)
ED Provider at bedside. 

## 2019-07-20 ENCOUNTER — Encounter (HOSPITAL_COMMUNITY): Payer: Self-pay | Admitting: Family Medicine

## 2019-07-20 DIAGNOSIS — R4182 Altered mental status, unspecified: Secondary | ICD-10-CM | POA: Diagnosis not present

## 2019-07-20 DIAGNOSIS — I120 Hypertensive chronic kidney disease with stage 5 chronic kidney disease or end stage renal disease: Secondary | ICD-10-CM | POA: Diagnosis present

## 2019-07-20 DIAGNOSIS — N185 Chronic kidney disease, stage 5: Secondary | ICD-10-CM | POA: Diagnosis present

## 2019-07-20 DIAGNOSIS — E538 Deficiency of other specified B group vitamins: Secondary | ICD-10-CM | POA: Diagnosis not present

## 2019-07-20 DIAGNOSIS — F319 Bipolar disorder, unspecified: Secondary | ICD-10-CM | POA: Diagnosis present

## 2019-07-20 DIAGNOSIS — N178 Other acute kidney failure: Secondary | ICD-10-CM | POA: Diagnosis not present

## 2019-07-20 DIAGNOSIS — R569 Unspecified convulsions: Secondary | ICD-10-CM | POA: Diagnosis not present

## 2019-07-20 DIAGNOSIS — G40909 Epilepsy, unspecified, not intractable, without status epilepticus: Secondary | ICD-10-CM | POA: Diagnosis present

## 2019-07-20 DIAGNOSIS — N179 Acute kidney failure, unspecified: Secondary | ICD-10-CM | POA: Diagnosis present

## 2019-07-20 DIAGNOSIS — A6004 Herpesviral vulvovaginitis: Secondary | ICD-10-CM | POA: Diagnosis present

## 2019-07-20 DIAGNOSIS — E872 Acidosis: Secondary | ICD-10-CM | POA: Diagnosis not present

## 2019-07-20 DIAGNOSIS — N184 Chronic kidney disease, stage 4 (severe): Secondary | ICD-10-CM

## 2019-07-20 DIAGNOSIS — Z87891 Personal history of nicotine dependence: Secondary | ICD-10-CM | POA: Diagnosis not present

## 2019-07-20 DIAGNOSIS — Z833 Family history of diabetes mellitus: Secondary | ICD-10-CM | POA: Diagnosis not present

## 2019-07-20 DIAGNOSIS — E559 Vitamin D deficiency, unspecified: Secondary | ICD-10-CM | POA: Diagnosis present

## 2019-07-20 DIAGNOSIS — Z9884 Bariatric surgery status: Secondary | ICD-10-CM | POA: Diagnosis not present

## 2019-07-20 DIAGNOSIS — F3160 Bipolar disorder, current episode mixed, unspecified: Secondary | ICD-10-CM | POA: Diagnosis not present

## 2019-07-20 DIAGNOSIS — Z66 Do not resuscitate: Secondary | ICD-10-CM | POA: Diagnosis not present

## 2019-07-20 DIAGNOSIS — D519 Vitamin B12 deficiency anemia, unspecified: Secondary | ICD-10-CM | POA: Diagnosis present

## 2019-07-20 DIAGNOSIS — Z20822 Contact with and (suspected) exposure to covid-19: Secondary | ICD-10-CM | POA: Diagnosis present

## 2019-07-20 DIAGNOSIS — Z79899 Other long term (current) drug therapy: Secondary | ICD-10-CM | POA: Diagnosis not present

## 2019-07-20 DIAGNOSIS — Z7982 Long term (current) use of aspirin: Secondary | ICD-10-CM | POA: Diagnosis not present

## 2019-07-20 DIAGNOSIS — G9341 Metabolic encephalopathy: Secondary | ICD-10-CM | POA: Diagnosis not present

## 2019-07-20 DIAGNOSIS — Z515 Encounter for palliative care: Secondary | ICD-10-CM | POA: Diagnosis not present

## 2019-07-20 DIAGNOSIS — Z8249 Family history of ischemic heart disease and other diseases of the circulatory system: Secondary | ICD-10-CM | POA: Diagnosis not present

## 2019-07-20 DIAGNOSIS — Z841 Family history of disorders of kidney and ureter: Secondary | ICD-10-CM | POA: Diagnosis not present

## 2019-07-20 DIAGNOSIS — Z79891 Long term (current) use of opiate analgesic: Secondary | ICD-10-CM | POA: Diagnosis not present

## 2019-07-20 LAB — BASIC METABOLIC PANEL
Anion gap: 16 — ABNORMAL HIGH (ref 5–15)
BUN: 96 mg/dL — ABNORMAL HIGH (ref 6–20)
CO2: 16 mmol/L — ABNORMAL LOW (ref 22–32)
Calcium: 5.3 mg/dL — CL (ref 8.9–10.3)
Chloride: 105 mmol/L (ref 98–111)
Creatinine, Ser: 6.71 mg/dL — ABNORMAL HIGH (ref 0.44–1.00)
GFR calc Af Amer: 7 mL/min — ABNORMAL LOW (ref >60)
GFR calc non Af Amer: 6 mL/min — ABNORMAL LOW (ref >60)
Glucose, Bld: 79 mg/dL (ref 70–99)
Potassium: 3.5 mmol/L (ref 3.5–5.1)
Sodium: 137 mmol/L (ref 135–145)

## 2019-07-20 LAB — CBC
HCT: 25.8 % — ABNORMAL LOW (ref 36.0–46.0)
Hemoglobin: 8.1 g/dL — ABNORMAL LOW (ref 12.0–15.0)
MCH: 29.7 pg (ref 26.0–34.0)
MCHC: 31.4 g/dL (ref 30.0–36.0)
MCV: 94.5 fL (ref 80.0–100.0)
Platelets: 160 10*3/uL (ref 150–400)
RBC: 2.73 MIL/uL — ABNORMAL LOW (ref 3.87–5.11)
RDW: 13.9 % (ref 11.5–15.5)
WBC: 5.4 10*3/uL (ref 4.0–10.5)
nRBC: 0 % (ref 0.0–0.2)

## 2019-07-20 LAB — URINALYSIS, ROUTINE W REFLEX MICROSCOPIC
Bilirubin Urine: NEGATIVE
Glucose, UA: NEGATIVE mg/dL
Ketones, ur: NEGATIVE mg/dL
Nitrite: NEGATIVE
Protein, ur: 100 mg/dL — AB
Specific Gravity, Urine: 1.008 (ref 1.005–1.030)
pH: 5 (ref 5.0–8.0)

## 2019-07-20 LAB — RESPIRATORY PANEL BY RT PCR (FLU A&B, COVID)
Influenza A by PCR: NEGATIVE
Influenza B by PCR: NEGATIVE
SARS Coronavirus 2 by RT PCR: NEGATIVE

## 2019-07-20 LAB — CREATININE, URINE, RANDOM: Creatinine, Urine: 85.19 mg/dL

## 2019-07-20 LAB — CK: Total CK: 1858 U/L — ABNORMAL HIGH (ref 38–234)

## 2019-07-20 LAB — PHOSPHORUS: Phosphorus: 7.5 mg/dL — ABNORMAL HIGH (ref 2.5–4.6)

## 2019-07-20 LAB — HIV ANTIBODY (ROUTINE TESTING W REFLEX): HIV Screen 4th Generation wRfx: NONREACTIVE

## 2019-07-20 LAB — SODIUM, URINE, RANDOM: Sodium, Ur: 21 mmol/L

## 2019-07-20 LAB — MAGNESIUM: Magnesium: 1.8 mg/dL (ref 1.7–2.4)

## 2019-07-20 MED ORDER — SODIUM BICARBONATE 650 MG PO TABS
650.0000 mg | ORAL_TABLET | Freq: Two times a day (BID) | ORAL | Status: DC
  Administered 2019-07-20 – 2019-07-25 (×11): 650 mg via ORAL
  Filled 2019-07-20 (×11): qty 1

## 2019-07-20 MED ORDER — ENOXAPARIN SODIUM 40 MG/0.4ML ~~LOC~~ SOLN
40.0000 mg | Freq: Every day | SUBCUTANEOUS | Status: DC
  Administered 2019-07-20: 09:00:00 40 mg via SUBCUTANEOUS
  Filled 2019-07-20: qty 0.4

## 2019-07-20 MED ORDER — LEVETIRACETAM ER 500 MG PO TB24
500.0000 mg | ORAL_TABLET | Freq: Every day | ORAL | Status: DC
  Administered 2019-07-20 – 2019-07-25 (×6): 500 mg via ORAL
  Filled 2019-07-20 (×7): qty 1

## 2019-07-20 MED ORDER — ASPIRIN EC 81 MG PO TBEC
81.0000 mg | DELAYED_RELEASE_TABLET | Freq: Every day | ORAL | Status: DC
  Administered 2019-07-20 – 2019-07-25 (×6): 81 mg via ORAL
  Filled 2019-07-20 (×6): qty 1

## 2019-07-20 MED ORDER — FERROUS FUMARATE 324 (106 FE) MG PO TABS
1.0000 | ORAL_TABLET | Freq: Every day | ORAL | Status: DC
  Administered 2019-07-20 – 2019-07-21 (×2): 106 mg via ORAL
  Filled 2019-07-20 (×5): qty 1

## 2019-07-20 MED ORDER — HEPARIN SODIUM (PORCINE) 5000 UNIT/ML IJ SOLN
5000.0000 [IU] | Freq: Three times a day (TID) | INTRAMUSCULAR | Status: DC
  Administered 2019-07-21 – 2019-07-25 (×12): 5000 [IU] via SUBCUTANEOUS
  Filled 2019-07-20 (×12): qty 1

## 2019-07-20 MED ORDER — CALCIUM GLUCONATE-NACL 2-0.675 GM/100ML-% IV SOLN
2.0000 g | Freq: Once | INTRAVENOUS | Status: DC
  Filled 2019-07-20: qty 100

## 2019-07-20 MED ORDER — ACETAMINOPHEN 325 MG PO TABS
650.0000 mg | ORAL_TABLET | Freq: Three times a day (TID) | ORAL | Status: DC | PRN
  Administered 2019-07-20: 650 mg via ORAL
  Filled 2019-07-20: qty 2

## 2019-07-20 MED ORDER — VALACYCLOVIR HCL 500 MG PO TABS
500.0000 mg | ORAL_TABLET | Freq: Every day | ORAL | Status: DC
  Administered 2019-07-20 – 2019-07-25 (×6): 500 mg via ORAL
  Filled 2019-07-20 (×6): qty 1

## 2019-07-20 MED ORDER — CALCIUM GLUCONATE-NACL 1-0.675 GM/50ML-% IV SOLN
1.0000 g | INTRAVENOUS | Status: AC
  Administered 2019-07-20 (×2): 1000 mg via INTRAVENOUS
  Filled 2019-07-20 (×2): qty 50

## 2019-07-20 MED ORDER — SODIUM CHLORIDE 0.9 % IV SOLN: INTRAVENOUS | Status: DC

## 2019-07-20 MED ORDER — CARVEDILOL 12.5 MG PO TABS
25.0000 mg | ORAL_TABLET | Freq: Two times a day (BID) | ORAL | Status: DC
  Administered 2019-07-20 – 2019-07-25 (×11): 25 mg via ORAL
  Filled 2019-07-20 (×11): qty 2

## 2019-07-20 MED ORDER — LACTATED RINGERS IV SOLN: INTRAVENOUS | Status: DC

## 2019-07-20 MED ORDER — CALCITRIOL 0.25 MCG PO CAPS
0.2500 ug | ORAL_CAPSULE | Freq: Every day | ORAL | Status: DC
  Administered 2019-07-20 – 2019-07-21 (×2): 0.25 ug via ORAL
  Filled 2019-07-20 (×3): qty 1

## 2019-07-20 MED ORDER — CALCIUM GLUCONATE-NACL 1-0.675 GM/50ML-% IV SOLN
1.0000 g | INTRAVENOUS | Status: AC
  Administered 2019-07-20 (×2): 1000 mg via INTRAVENOUS
  Filled 2019-07-20 (×2): qty 50

## 2019-07-20 MED ORDER — CALCIUM CARBONATE ANTACID 500 MG PO CHEW
1.0000 | CHEWABLE_TABLET | Freq: Three times a day (TID) | ORAL | Status: DC
  Administered 2019-07-20 – 2019-07-25 (×14): 200 mg via ORAL
  Filled 2019-07-20 (×14): qty 1

## 2019-07-20 MED ORDER — CALCIUM CARBONATE 1250 (500 CA) MG PO TABS
1000.0000 mg | ORAL_TABLET | Freq: Every day | ORAL | Status: DC
  Administered 2019-07-20 – 2019-07-21 (×2): 1000 mg via ORAL
  Filled 2019-07-20 (×2): qty 1

## 2019-07-20 NOTE — Progress Notes (Signed)
CRITICAL VALUE ALERT  Critical Value:  Calcium 5.3   Date & Time Notied:  07/20/2019 1020  Provider Notified: Dr. Wynetta Emery   Orders Received/Actions taken: no new orders received

## 2019-07-20 NOTE — ED Notes (Signed)
Hospitalist at bedside 

## 2019-07-20 NOTE — H&P (Signed)
TRH H&P    Patient Demographics:    [name], is a 54 y.o. female  MRN: [id number]  DOB - 11/10/1965  Admit Date - 07/19/2019  Referring MD/NP/PA: Dr. Roderic Palau  Outpatient Primary MD for the patient is Hilty, Nadean Corwin, MD  Patient coming from: Home all  Chief complaint- altered mental status   HPI:    [name]  is a 54 y.o. female, with history of seizure disorder, hypertension, bipolar disorder was brought to hospital with altered mental status.  Patient has history of seizure but there is no witnessed seizure activity.  In the ED, patient was found to have worsening renal function with elevated CPK 1900, hypocalcemia. Patient is more alert, able to give some history but still mildly confused. She denies chest pain or shortness of breath. Denies biting of tongue or bladder incontinence. Does not remember medication history including Keppra. Denies abdominal pain or dysuria    Review of systems:    In addition to the HPI above,    All other systems reviewed and are negative.    Past History of the following :    Past Medical History:  Diagnosis Date  . Anemia   . Anxiety   . Bipolar disorder ([place])   . Depression   . History of blood transfusion   . Hypertension   . IUD    HISTORY OF IUD --REMOVED IN 2006  . Seizures ([place]) [id number]   two seizures due to a med changes      Past Surgical History:  Procedure Laterality Date  . CARPAL TUNNEL RELEASE Right 07/08/2014   Procedure: RIGHT CARPAL TUNNEL RELEASE;  Surgeon: Sanjuana Kava, MD;  Location: [facility];  Service: Orthopedics;  Laterality: Right;  . CHOLECYSTECTOMY    . GASTRIC BYPASS  2000  . HYSTEROSCOPY WITH D & C  11/14/2011   Procedure: DILATATION AND CURETTAGE /HYSTEROSCOPY;  Surgeon: Marylynn Pearson, MD;  Location: [facility];  Service: Gynecology;;  with removal of polyps  . INTRAUTERINE DEVICE (IUD) INSERTION  03/21/2010    . LAPAROSCOPIC GASTROTOMY W/ REPAIR OF ULCER    . PANNICULECTOMY    . ROTATOR CUFF REPAIR        Social History:      Social History   Tobacco Use  . Smoking status: Former Smoker    Packs/day: 0.25    Years: 4.00    Pack years: 1.00    Types: Cigarettes    Quit date: 03/21/1988    Years since quitting: 31.3  . Smokeless tobacco: Never Used  Substance Use Topics  . Alcohol use: Yes    Comment: 1-2 times a year.        Family History :     Family History  Problem Relation Age of Onset  . Hypertension Mother   . Kidney failure Mother   . Hypertension Father   . Diabetes Cousin   . Diabetes Sister   . Hypertension Sister   . Kidney disease Sister        dialysis  . Hypertension Brother   .  Heart attack Brother   . Hypertension Maternal Aunt   . Hypertension Maternal Uncle   . Hypertension Maternal Grandmother   . Hypertension Maternal Grandfather   . Hypertension Paternal Grandmother   . Hypertension Paternal Grandfather   . Heart attack Brother   . Hypertension Brother   . Hypertension Sister       Home Medications:   Prior to Admission medications   Medication Sig Start Date End Date Taking? Authorizing Provider  tiZANidine (ZANAFLEX) 4 MG tablet TAKE 1 TABLET BY MOUTH EVERY 12 HOURS AS NEEDED FOR SPASM 05/10/19   Sanjuana Kava, MD  acetaminophen (TYLENOL 8 HOUR) 650 MG CR tablet Take 650 mg by mouth every 8 (eight) hours as needed for pain.    [provider]  aspirin EC 81 MG tablet Take 81 mg by mouth daily.    [provider]  carvedilol (COREG) 25 MG tablet Take 25 mg by mouth 2 (two) times daily with a meal.    [provider]  Ferrous Fumarate (HEMOCYTE - 106 MG FE) 324 (106 Fe) MG TABS tablet Take 1 tablet by mouth daily. Takes 1-2 times daily as tolerated 03/29/18   [provider]  furosemide (LASIX) 80 MG tablet Take 1 tablet (80 mg total) by mouth daily. 09/12/18   Orson Eva, MD  levETIRAcetam (KEPPRA XR) 500  MG 24 hr tablet Take 1 tablet (500 mg total) by mouth daily. 05/30/19   Cameron Sprang, MD  promethazine (PHENERGAN) 25 MG tablet Take 25 mg by mouth every 6 (six) hours as needed. 01/01/19   [provider]  traMADol (ULTRAM) 50 MG tablet Take 1 tablet (50 mg total) by mouth every 6 (six) hours as needed. 05/02/19   Sanjuana Kava, MD  valACYclovir (VALTREX) 1000 MG tablet TAKE 1/2 TABLET BY MOUTH DAILY 10/22/18   Donnamae Jude, MD  venlafaxine XR (EFFEXOR-XR) 150 MG 24 hr capsule Take 150 mg by mouth daily with breakfast.    [provider]  vitamin B-12 (CYANOCOBALAMIN) 1000 MCG tablet Take 1 tablet by mouth daily. 10/01/18   [provider]     Allergies:     Allergies  Allergen Reactions  . Ambien [Zolpidem Tartrate]     "BLACK OUT", SLEEP DRIVING  . Geodon [Ziprasidone Hcl]     Seizure activity  . Lactulose Itching  . Ondansetron Nausea And Vomiting  . Quetiapine     Other reaction(s): Other (See Comments) Possible Hair Loss  . Sulfa Antibiotics Rash  . Zolpidem Rash     Physical Exam:   Vitals  Blood pressure (!) 164/108, pulse 88, temperature 98.6 F (37 C), temperature source Oral, resp. rate 16, height 4\' 11"  (1.499 m), weight 77.1 kg, SpO2 100 %.  1.  General: Appears in no acute distress  2. Psychiatric: Alert, oriented x 3, intact insight and judgment.  3. Neurologic: Cranial nerves II through XII grossly intact, no focal deficit noted, moving all extremities  4. HEENMT:  Atraumatic normocephalic, extraocular muscles are intact  5. Respiratory : Clear to auscultation bilaterally, no wheezing or crackles auscultated  6. Cardiovascular : S1-S2, regular, no murmur auscultated  7. Gastrointestinal:  Abdomen is soft, nontender, no organomegaly      Data Review:    CBC Recent Labs  Lab 07/19/19 2015  WBC 5.8  HGB 8.5*  HCT 27.5*  PLT 164  MCV 95.2  MCH 29.4  MCHC 30.9  RDW 13.7  LYMPHSABS 0.9  MONOABS 0.7  EOSABS  0.0  BASOSABS 0.0   ------------------------------------------------------------------------------------------------------------------  Results for orders placed or performed during the hospital encounter of 07/19/19 (from the past 48 hour(s))  CBC with Differential     Status: Abnormal   Collection Time: 07/19/19  8:15 PM  Result Value Ref Range   WBC 5.8 4.0 - 10.5 K/uL   RBC 2.89 (L) 3.87 - 5.11 MIL/uL   Hemoglobin 8.5 (L) 12.0 - 15.0 g/dL   HCT 27.5 (L) 36.0 - 46.0 %   MCV 95.2 80.0 - 100.0 fL   MCH 29.4 26.0 - 34.0 pg   MCHC 30.9 30.0 - 36.0 g/dL   RDW 13.7 11.5 - 15.5 %   Platelets 164 150 - 400 K/uL   nRBC 0.0 0.0 - 0.2 %   Neutrophils Relative % 73 %   Neutro Abs 4.2 1.7 - 7.7 K/uL   Lymphocytes Relative 15 %   Lymphs Abs 0.9 0.7 - 4.0 K/uL   Monocytes Relative 12 %   Monocytes Absolute 0.7 0.1 - 1.0 K/uL   Eosinophils Relative 0 %   Eosinophils Absolute 0.0 0.0 - 0.5 K/uL   Basophils Relative 0 %   Basophils Absolute 0.0 0.0 - 0.1 K/uL   Immature Granulocytes 0 %   Abs Immature Granulocytes 0.01 0.00 - 0.07 K/uL    Comment: Performed at [facility], [address], [place], [place] [address]  Comprehensive metabolic panel     Status: Abnormal   Collection Time: 07/19/19  8:15 PM  Result Value Ref Range   Sodium 135 135 - 145 mmol/L   Potassium 3.6 3.5 - 5.1 mmol/L   Chloride 103 98 - 111 mmol/L   CO2 17 (L) 22 - 32 mmol/L   Glucose, Bld 86 70 - 99 mg/dL    Comment: Glucose reference range applies only to samples taken after fasting for at least 8 hours.   BUN 98 (H) 6 - 20 mg/dL   Creatinine, Ser 7.21 (H) 0.44 - 1.00 mg/dL   Calcium 5.4 (LL) 8.9 - 10.3 mg/dL    Comment: CRITICAL RESULT CALLED TO, READ BACK BY AND VERIFIED WITH: SAPPELT,J ON 07/19/19 AT 2125 BY LOY,C    Total Protein 6.6 6.5 - 8.1 g/dL   Albumin 3.3 (L) 3.5 - 5.0 g/dL   AST 29 15 - 41 U/L   ALT 15 0 - 44 U/L   Alkaline Phosphatase 122 38 - 126 U/L   Total Bilirubin 0.8 0.3 - 1.2 mg/dL   GFR  calc non Af Amer 6 (L) >60 mL/min   GFR calc Af Amer 7 (L) >60 mL/min   Anion gap 15 5 - 15    Comment: Performed at [facility], [address] [place], [place] [address]  CK     Status: Abnormal   Collection Time: 07/19/19  8:15 PM  Result Value Ref Range   Total CK 1,963 (H) 38 - 234 U/L    Comment: Performed at [facility], [address], [place], [place] [address]  Respiratory Panel by RT PCR (Flu A&B, Covid) - Nasopharyngeal Swab     Status: None   Collection Time: 07/19/19 11:00 PM   Specimen: Nasopharyngeal Swab  Result Value Ref Range   SARS Coronavirus 2 by RT PCR NEGATIVE NEGATIVE    Comment: (NOTE) SARS-CoV-2 target nucleic acids are NOT DETECTED. The SARS-CoV-2 RNA is generally detectable in upper respiratoy specimens during the acute phase of infection. The lowest concentration of SARS-CoV-2 viral copies this assay can detect is  131 copies/mL. A negative result does not preclude SARS-Cov-2 infection and should not be used as the sole basis for treatment or other patient management decisions. A negative result may occur with  improper specimen collection/handling, submission of specimen other than nasopharyngeal swab, presence of viral mutation(s) within the areas targeted by this assay, and inadequate number of viral copies (<131 copies/mL). A negative result must be combined with clinical observations, patient history, and epidemiological information. The expected result is Negative. Fact Sheet for Patients:  PinkCheek.be Fact Sheet for Healthcare Providers:  GravelBags.it This test is not yet ap proved or cleared by the [place] FDA and  has been authorized for detection and/or diagnosis of SARS-CoV-2 by FDA under an Emergency Use Authorization (EUA). This EUA will remain  in effect (meaning this test can be used) for the duration of the COVID-19 declaration under Section 564(b)(1) of the Act, 21  U.S.C. section 360bbb-3(b)(1), unless the authorization is terminated or revoked sooner.    Influenza A by PCR NEGATIVE NEGATIVE   Influenza B by PCR NEGATIVE NEGATIVE    Comment: (NOTE) The Xpert Xpress SARS-CoV-2/FLU/RSV assay is intended as an aid in  the diagnosis of influenza from Nasopharyngeal swab specimens and  should not be used as a sole basis for treatment. Nasal washings and  aspirates are unacceptable for Xpert Xpress SARS-CoV-2/FLU/RSV  testing. Fact Sheet for Patients: PinkCheek.be Fact Sheet for Healthcare Providers: GravelBags.it This test is not yet approved or cleared by the [place] FDA and  has been authorized for detection and/or diagnosis of SARS-CoV-2 by  FDA under an Emergency Use Authorization (EUA). This EUA will remain  in effect (meaning this test can be used) for the duration of the  Covid-19 declaration under Section 564(b)(1) of the Act, 21  U.S.C. section 360bbb-3(b)(1), unless the authorization is  terminated or revoked. Performed at [facility], [address] [place], [place] [address]     Chemistries  Recent Labs  Lab 07/19/19 2015  NA 135  K 3.6  CL 103  CO2 17*  GLUCOSE 86  BUN 98*  CREATININE 7.21*  CALCIUM 5.4*  AST 29  ALT 15  ALKPHOS 122  BILITOT 0.8   ------------------------------------------------------------------------------------------------------------------  ------------------------------------------------------------------------------------------------------------------ GFR: Estimated Creatinine Clearance: 8.1 mL/min (A) (by C-G formula based on SCr of 7.21 mg/dL (H)). Liver Function Tests: Recent Labs  Lab 07/19/19 2015  AST 29  ALT 15  ALKPHOS 122  BILITOT 0.8  PROT 6.6  ALBUMIN 3.3*   No results for input(s): LIPASE, AMYLASE in the last 168 hours. No results for input(s): AMMONIA in the last 168 hours. Coagulation Profile: No results for  input(s): INR, PROTIME in the last 168 hours. Cardiac Enzymes: Recent Labs  Lab 07/19/19 2015  CKTOTAL 1,963*   BNP (last 3 results) No results for input(s): PROBNP in the last 8760 hours. HbA1C: No results for input(s): HGBA1C in the last 72 hours. CBG: No results for input(s): GLUCAP in the last 168 hours. Lipid Profile: No results for input(s): CHOL, HDL, LDLCALC, TRIG, CHOLHDL, LDLDIRECT in the last 72 hours. Thyroid Function Tests: No results for input(s): TSH, T4TOTAL, FREET4, T3FREE, THYROIDAB in the last 72 hours. Anemia Panel: No results for input(s): VITAMINB12, FOLATE, FERRITIN, TIBC, IRON, RETICCTPCT in the last 72 hours.  --------------------------------------------------------------------------------------------------------------- Urine analysis:    Component Value Date/Time   COLORURINE YELLOW 09/09/2018 0815   APPEARANCEUR HAZY (A) 09/09/2018 0815   LABSPEC 1.017 09/09/2018 0815   PHURINE 5.0 09/09/2018 0815   GLUCOSEU  NEGATIVE 09/09/2018 0815   HGBUR SMALL (A) 09/09/2018 0815   BILIRUBINUR NEGATIVE 09/09/2018 0815   BILIRUBINUR neg 12/01/2017 1030   KETONESUR NEGATIVE 09/09/2018 0815   PROTEINUR >=300 (A) 09/09/2018 0815   UROBILINOGEN 0.2 12/01/2017 1030   UROBILINOGEN 0.2 07/03/2014 1130   NITRITE NEGATIVE 09/09/2018 0815   LEUKOCYTESUR NEGATIVE 09/09/2018 0815      Imaging Results:    CT Head Wo Contrast  Result Date: 07/19/2019 CLINICAL DATA:  Altered mental status. EXAM: CT HEAD WITHOUT CONTRAST TECHNIQUE: Contiguous axial images were obtained from the base of the skull through the vertex without intravenous contrast. COMPARISON:  September 08, 2018 FINDINGS: Brain: No evidence of acute infarction, hemorrhage, hydrocephalus, extra-axial collection or mass lesion/mass effect. Stable extensive white matter hypoattenuation consistent with advanced small vessel disease. Vascular: No hyperdense vessel or unexpected calcification. Skull: Normal. Negative for  fracture or focal lesion. Sinuses/Orbits: No acute finding. Other: None. IMPRESSION: No acute intracranial pathology. Electronically Signed   By: Virgina Norfolk M.D.   On: 07/19/2019 22:19    My personal review of EKG: Rhythm NSR, QTC 524   Assessment & Plan:    Active Problems:   Seizure ([place])   1. Altered mental status-unclear etiology, patient has history of seizure disorder and also has elevated CPK, may have post ictal confusion after seizure.  Unclear if patient is compliant with her medications.  Will obtain Keppra level.  Patient is on tramadol 50 mg every 6 hours as needed and tizanidine.  Will hold these medications.  CT head is unremarkable.  Patient started on IV LR at 100 mL/h. 2. Seizure disorder-patient takes Keppra 500 mg daily at home.  Unsure if patient has been compliant with Keppra. Patient received Keppra 500 mg IV in the ED.  We will continue with Keppra 500 mg p.o. daily from tomorrow morning.  I called and discussed with neurologist on-call Dr. Leonel Ramsay, he does not recommend increasing dose of Keppra at this time considering patient ESRD.  He recommends obtaining Keppra level.  Will consult neurology in a.m.  Hold off on EEG for now.  Start on seizure precautions. 3. Acute kidney injury on CKD stage IV-patient last creatinine was 4.85 as of June 2020.  Today she is presenting with creatinine of 7.21.  Unclear whether this is acute versus new baseline.  Will consult nephrology in a.m.  Patient started on LR.  Follow BMP in a.m. 4. Elevated CPK-patient presenting with CPK elevated 1963.  Could be secondary to seizure.  Started on LR at 100 mL/h.  Recheck CPK level in a.m. 5. Prolonged QTC-QTC is prolonged 524, will monitor on telemetry.  Check serum magnesium level. 6. Hypocalcemia-corrected calcium 6.0, will give 2 g of magnesium gluconate IV x1.  Follow serum calcium level in a.m. 7. Metabolic acidosis-appears chronic, today CO2 is 17.  Start sodium bicarb 650 mg p.o.  twice daily.    DVT Prophylaxis-   Lovenox   AM Labs Ordered, also please review Full Orders  Family Communication: Admission, patients condition and plan of care including tests being ordered have been discussed with the patient  who indicate understanding and agree with the plan and Code Status.  Code Status: Full code  Admission status: Observation  Time spent in minutes : 60 minutes   Rachael Zapanta S Harjit Douds M.D

## 2019-07-20 NOTE — Consult Note (Signed)
Reason for Consult: Acute kidney injury on chronic kidney disease stage IV/V Referring Physician: Irwin Brakeman MD [facility])  HPI:  54 year old African-American woman with past medical history significant for hypertension, dyslipidemia, bipolar disorder, seizure disorder and chronic kidney disease stage IV/V at baseline.  From last available records, it appears that her baseline creatinine is around 4.8.  She was brought into the emergency room yesterday with altered mental status and consequent labs showed elevated CPK of 1900, acute kidney injury with a creatinine of 7.2 and hypercalcemia of 5.4 (corrected 6.0).  When I saw the patient on 07/20/2019 at 1450; she was lethargic and very somnolent; arousing with difficulty and will be able to answer closed ended questions.  She has no urine output charted from overnight and does not have a urinalysis.  She says "no" to whether she follows up with a nephrologist, took any nonsteroidal anti-inflammatory drugs or had any recent infections/antibiotics.  She is unable to tell me who her primary care provider is.  A review of her home medication list is significant for furosemide 80 mg daily without any RAS blockers.  Past Medical History:  Diagnosis Date  . Anemia   . Anxiety   . Bipolar disorder ([place])   . Depression   . History of blood transfusion   . Hypertension   . IUD    HISTORY OF IUD --REMOVED IN 2006  . Seizures ([place]) [id number]   two seizures due to a med changes    Past Surgical History:  Procedure Laterality Date  . CARPAL TUNNEL RELEASE Right 07/08/2014   Procedure: RIGHT CARPAL TUNNEL RELEASE;  Surgeon: Sanjuana Kava, MD;  Location: [facility];  Service: Orthopedics;  Laterality: Right;  . CHOLECYSTECTOMY    . GASTRIC BYPASS  2000  . HYSTEROSCOPY WITH D & C  11/14/2011   Procedure: DILATATION AND CURETTAGE /HYSTEROSCOPY;  Surgeon: Marylynn Pearson, MD;  Location: [facility];  Service: Gynecology;;  with removal of polyps  . INTRAUTERINE DEVICE  (IUD) INSERTION  03/21/2010  . LAPAROSCOPIC GASTROTOMY W/ REPAIR OF ULCER    . PANNICULECTOMY    . ROTATOR CUFF REPAIR      Family History  Problem Relation Age of Onset  . Hypertension Mother   . Kidney failure Mother   . Hypertension Father   . Diabetes Cousin   . Diabetes Sister   . Hypertension Sister   . Kidney disease Sister        dialysis  . Hypertension Brother   . Heart attack Brother   . Hypertension Maternal Aunt   . Hypertension Maternal Uncle   . Hypertension Maternal Grandmother   . Hypertension Maternal Grandfather   . Hypertension Paternal Grandmother   . Hypertension Paternal Grandfather   . Heart attack Brother   . Hypertension Brother   . Hypertension Sister     Social History:  reports that she quit smoking about 31 years ago. Her smoking use included cigarettes. She has a 1.00 pack-year smoking history. She has never used smokeless tobacco. She reports current alcohol use. She reports that she does not use drugs.  Allergies:  Allergies  Allergen Reactions  . Ambien [Zolpidem Tartrate]     "BLACK OUT", SLEEP DRIVING  . Geodon [Ziprasidone Hcl]     Seizure activity  . Lactulose Itching  . Ondansetron Nausea And Vomiting  . Quetiapine     Other reaction(s): Other (See Comments) Possible Hair Loss  . Sulfa Antibiotics Rash  . Zolpidem Rash    Medications:  Scheduled: . aspirin EC  81 mg Oral Daily  . calcium carbonate  1 tablet Oral TID WC  . carvedilol  25 mg Oral BID WC  . Ferrous Fumarate  1 tablet Oral Daily  . [START ON 07/21/2019] heparin injection (subcutaneous)  5,000 Units Subcutaneous Q8H  . levETIRAcetam  500 mg Oral Daily  . sodium bicarbonate  650 mg Oral BID  . valACYclovir  500 mg Oral Daily    BMP Latest Ref Rng & Units 07/20/2019 07/19/2019 09/11/2018  Glucose 70 - 99 mg/dL 79 86 77  BUN 6 - 20 mg/dL 96(H) 98(H) 74(H)  Creatinine 0.44 - 1.00 mg/dL 6.71(H) 7.21(H) 4.79(H)  BUN/Creat Ratio 9 - 23 - - -  Sodium 135 - 145 mmol/L  137 135 142  Potassium 3.5 - 5.1 mmol/L 3.5 3.6 3.9  Chloride 98 - 111 mmol/L 105 103 113(H)  CO2 22 - 32 mmol/L 16(L) 17(L) 21(L)  Calcium 8.9 - 10.3 mg/dL 5.3(LL) 5.4(LL) 7.0(L)   CBC Latest Ref Rng & Units 07/20/2019 07/19/2019 09/08/2018  WBC 4.0 - 10.5 K/uL 5.4 5.8 5.9  Hemoglobin 12.0 - 15.0 g/dL 8.1(L) 8.5(L) 11.7(L)  Hematocrit 36.0 - 46.0 % 25.8(L) 27.5(L) 38.0  Platelets 150 - 400 K/uL 160 164 199   Urinalysis    Component Value Date/Time   COLORURINE YELLOW 09/09/2018 0815   APPEARANCEUR HAZY (A) 09/09/2018 0815   LABSPEC 1.017 09/09/2018 0815   PHURINE 5.0 09/09/2018 0815   GLUCOSEU NEGATIVE 09/09/2018 0815   HGBUR SMALL (A) 09/09/2018 0815   BILIRUBINUR NEGATIVE 09/09/2018 0815   BILIRUBINUR neg 12/01/2017 1030   KETONESUR NEGATIVE 09/09/2018 0815   PROTEINUR >=300 (A) 09/09/2018 0815   UROBILINOGEN 0.2 12/01/2017 1030   UROBILINOGEN 0.2 07/03/2014 1130   NITRITE NEGATIVE 09/09/2018 0815   LEUKOCYTESUR NEGATIVE 09/09/2018 0815   CT Head Wo Contrast  Result Date: 07/19/2019 CLINICAL DATA:  Altered mental status. EXAM: CT HEAD WITHOUT CONTRAST TECHNIQUE: Contiguous axial images were obtained from the base of the skull through the vertex without intravenous contrast. COMPARISON:  September 08, 2018 FINDINGS: Brain: No evidence of acute infarction, hemorrhage, hydrocephalus, extra-axial collection or mass lesion/mass effect. Stable extensive white matter hypoattenuation consistent with advanced small vessel disease. Vascular: No hyperdense vessel or unexpected calcification. Skull: Normal. Negative for fracture or focal lesion. Sinuses/Orbits: No acute finding. Other: None. IMPRESSION: No acute intracranial pathology. Electronically Signed   By: Virgina Norfolk M.D.   On: 07/19/2019 22:19    Review of Systems  Unable to perform ROS: Mental status change   Blood pressure 122/69, pulse 88, temperature 98.7 F (37.1 C), temperature source Oral, resp. rate 17, height 4\' 11"   (1.499 m), weight 77.1 kg, SpO2 99 %. Physical Exam  Nursing note and vitals reviewed. Constitutional: She appears well-developed and well-nourished.  HENT:  Head: Normocephalic and atraumatic.  Eyes: Conjunctivae are normal.  Neck: No JVD present.  Cardiovascular: Normal rate, regular rhythm and normal heart sounds.  No murmur heard. Respiratory: Effort normal and breath sounds normal. She has no wheezes. She has no rales.  GI: Soft. Bowel sounds are normal. There is no abdominal tenderness. There is no rebound and no guarding.  Musculoskeletal:        General: No edema.     Cervical back: Normal range of motion and neck supple.  Neurological:  Somnolent/lethargic.  Arouses to calling out her name/physical stimulation.  Unable to respond to orientation questions.  Skin: Skin is warm and dry. No rash noted.  Assessment/Plan: 1.  Acute kidney injury on chronic kidney disease stage IV/V: Underlying chronic kidney disease suspected to be secondary to hypertension with what appears to be prior history of nonadherence with progression.  Her acute injury is plausibly hemodynamically mediated versus associated with pigment nephropathy although CPK level not significantly elevated.  I recommend placing a Foley catheter for better monitoring of urine output as we continue efforts at volume expanding her suspecting an element of volume contraction leading up to her hospitalization.  I will send off for a urinalysis and urine electrolytes.  Overnight, renal function already appears to be improving with intravenous fluids with her creatinine down from 7.2 to 6.7.  I have switched her intravenous fluids to lactated Ringer's at 100 cc an hour from normal saline given the presence of borderline low potassium level and anion gap metabolic acidosis. 2.  Anion gap metabolic acidosis: Likely secondary to acute worsening of renal insufficiency however, may also be in part from mild rhabdomyolysis/starvation  ketosis.  Treat with lactated Ringer's and oral sodium bicarbonate supplementation. 3.  Hypocalcemia: Significantly low calcium levels noted that indeed may lower seizure threshold, will check ionized calcium status post intravenous and oral calcium supplementation.  We will check a 25-hydroxy vitamin D level/intact PTH level and add calcitriol to promote GI calcium absorption. 4.  Altered mental status: Possibly suspected to have had seizure with postictal episode however due to the residual nature of her altered mental status, a question arises as to whether this is in part from polypharmacy with use of tramadol, tizanidine and hydroxyzine at baseline.  Monitor with intravenous fluids and correction of calcium level.  Levetiracetam levels pending. 5.  Anemia: Check iron studies to decide on need for replacement with intravenous iron rather than oral with limited efficacy and chronic kidney disease.  Will decide on need for ESA.   Dorissa Stinnette K. 07/20/2019, 2:46 PM

## 2019-07-20 NOTE — Progress Notes (Signed)
ASSUMPTION OF CARE NOTE   07/20/2019 2:38 PM  [name] was seen and examined.  The H&P by the admitting provider, orders, imaging was reviewed.  Please see new orders.  Will continue to follow.   Vitals:   07/20/19 0800 07/20/19 1200  BP: 119/71 122/69  Pulse: 84 88  Resp: 19 17  Temp: 98.3 F (36.8 C) 98.7 F (37.1 C)  SpO2: 100% 99%    Results for orders placed or performed during the hospital encounter of 07/19/19  Respiratory Panel by RT PCR (Flu A&B, Covid) - Nasopharyngeal Swab   Specimen: Nasopharyngeal Swab  Result Value Ref Range   SARS Coronavirus 2 by RT PCR NEGATIVE NEGATIVE   Influenza A by PCR NEGATIVE NEGATIVE   Influenza B by PCR NEGATIVE NEGATIVE  CBC with Differential  Result Value Ref Range   WBC 5.8 4.0 - 10.5 K/uL   RBC 2.89 (L) 3.87 - 5.11 MIL/uL   Hemoglobin 8.5 (L) 12.0 - 15.0 g/dL   HCT 27.5 (L) 36.0 - 46.0 %   MCV 95.2 80.0 - 100.0 fL   MCH 29.4 26.0 - 34.0 pg   MCHC 30.9 30.0 - 36.0 g/dL   RDW 13.7 11.5 - 15.5 %   Platelets 164 150 - 400 K/uL   nRBC 0.0 0.0 - 0.2 %   Neutrophils Relative % 73 %   Neutro Abs 4.2 1.7 - 7.7 K/uL   Lymphocytes Relative 15 %   Lymphs Abs 0.9 0.7 - 4.0 K/uL   Monocytes Relative 12 %   Monocytes Absolute 0.7 0.1 - 1.0 K/uL   Eosinophils Relative 0 %   Eosinophils Absolute 0.0 0.0 - 0.5 K/uL   Basophils Relative 0 %   Basophils Absolute 0.0 0.0 - 0.1 K/uL   Immature Granulocytes 0 %   Abs Immature Granulocytes 0.01 0.00 - 0.07 K/uL  Comprehensive metabolic panel  Result Value Ref Range   Sodium 135 135 - 145 mmol/L   Potassium 3.6 3.5 - 5.1 mmol/L   Chloride 103 98 - 111 mmol/L   CO2 17 (L) 22 - 32 mmol/L   Glucose, Bld 86 70 - 99 mg/dL   BUN 98 (H) 6 - 20 mg/dL   Creatinine, Ser 7.21 (H) 0.44 - 1.00 mg/dL   Calcium 5.4 (LL) 8.9 - 10.3 mg/dL   Total Protein 6.6 6.5 - 8.1 g/dL   Albumin 3.3 (L) 3.5 - 5.0 g/dL   AST 29 15 - 41 U/L   ALT 15 0 - 44 U/L   Alkaline Phosphatase 122 38 - 126 U/L   Total Bilirubin 0.8 0.3 - 1.2 mg/dL   GFR calc non Af Amer 6 (L) >60 mL/min   GFR calc Af Amer 7 (L) >60 mL/min   Anion gap 15 5 - 15  CK  Result Value Ref Range   Total CK 1,963 (H) 38 - 234 U/L  Magnesium  Result Value Ref Range   Magnesium 1.8 1.7 - 2.4 mg/dL  CK  Result Value Ref Range   Total CK 1,858 (H) 38 - 234 U/L  Basic metabolic panel  Result Value Ref Range   Sodium 137 135 - 145 mmol/L   Potassium 3.5 3.5 - 5.1 mmol/L   Chloride 105 98 - 111 mmol/L   CO2 16 (L) 22 - 32 mmol/L   Glucose, Bld 79 70 - 99 mg/dL   BUN 96 (H) 6 - 20 mg/dL   Creatinine, Ser 6.71 (H) 0.44 - 1.00 mg/dL  Calcium 5.3 (LL) 8.9 - 10.3 mg/dL   GFR calc non Af Amer 6 (L) >60 mL/min   GFR calc Af Amer 7 (L) >60 mL/min   Anion gap 16 (H) 5 - 15  CBC  Result Value Ref Range   WBC 5.4 4.0 - 10.5 K/uL   RBC 2.73 (L) 3.87 - 5.11 MIL/uL   Hemoglobin 8.1 (L) 12.0 - 15.0 g/dL   HCT 25.8 (L) 36.0 - 46.0 %   MCV 94.5 80.0 - 100.0 fL   MCH 29.7 26.0 - 34.0 pg   MCHC 31.4 30.0 - 36.0 g/dL   RDW 13.9 11.5 - 15.5 %   Platelets 160 150 - 400 K/uL   nRBC 0.0 0.0 - 0.2 %     Murvin Natal, MD [facility]   07/19/2019  6:39 PM How to contact the [facility] Attending or Consulting provider [place] or covering provider during after hours [place], for this patient?  1. Check the care team in [facility] and look for a) attending/consulting [facility] provider listed and b) the [facility] team listed 2. Log into [email or web address] and use [place]'s universal password to access. If you do not have the password, please contact the hospital operator. 3. Locate the [facility] provider you are looking for under [facility] and page to a number that you can be directly reached. 4. If you still have difficulty reaching the provider, please page the [facility] (Director on Call) for the Hospitalists listed on amion for assistance.

## 2019-07-21 ENCOUNTER — Encounter (HOSPITAL_COMMUNITY): Payer: Self-pay | Admitting: Family Medicine

## 2019-07-21 DIAGNOSIS — E538 Deficiency of other specified B group vitamins: Secondary | ICD-10-CM

## 2019-07-21 DIAGNOSIS — A6004 Herpesviral vulvovaginitis: Secondary | ICD-10-CM

## 2019-07-21 LAB — CBC WITH DIFFERENTIAL/PLATELET
Abs Immature Granulocytes: 0.02 10*3/uL (ref 0.00–0.07)
Basophils Absolute: 0 10*3/uL (ref 0.0–0.1)
Basophils Relative: 0 %
Eosinophils Absolute: 0 10*3/uL (ref 0.0–0.5)
Eosinophils Relative: 0 %
HCT: 25.1 % — ABNORMAL LOW (ref 36.0–46.0)
Hemoglobin: 7.8 g/dL — ABNORMAL LOW (ref 12.0–15.0)
Immature Granulocytes: 0 %
Lymphocytes Relative: 21 %
Lymphs Abs: 1 10*3/uL (ref 0.7–4.0)
MCH: 29.9 pg (ref 26.0–34.0)
MCHC: 31.1 g/dL (ref 30.0–36.0)
MCV: 96.2 fL (ref 80.0–100.0)
Monocytes Absolute: 0.6 10*3/uL (ref 0.1–1.0)
Monocytes Relative: 14 %
Neutro Abs: 2.9 10*3/uL (ref 1.7–7.7)
Neutrophils Relative %: 65 %
Platelets: 163 10*3/uL (ref 150–400)
RBC: 2.61 MIL/uL — ABNORMAL LOW (ref 3.87–5.11)
RDW: 14 % (ref 11.5–15.5)
WBC: 4.6 10*3/uL (ref 4.0–10.5)
nRBC: 0 % (ref 0.0–0.2)

## 2019-07-21 LAB — RENAL FUNCTION PANEL
Albumin: 3 g/dL — ABNORMAL LOW (ref 3.5–5.0)
Anion gap: 11 (ref 5–15)
BUN: 85 mg/dL — ABNORMAL HIGH (ref 6–20)
CO2: 19 mmol/L — ABNORMAL LOW (ref 22–32)
Calcium: 5.2 mg/dL — CL (ref 8.9–10.3)
Chloride: 110 mmol/L (ref 98–111)
Creatinine, Ser: 5.99 mg/dL — ABNORMAL HIGH (ref 0.44–1.00)
GFR calc Af Amer: 9 mL/min — ABNORMAL LOW (ref >60)
GFR calc non Af Amer: 7 mL/min — ABNORMAL LOW (ref >60)
Glucose, Bld: 82 mg/dL (ref 70–99)
Phosphorus: 5.6 mg/dL — ABNORMAL HIGH (ref 2.5–4.6)
Potassium: 3.5 mmol/L (ref 3.5–5.1)
Sodium: 140 mmol/L (ref 135–145)

## 2019-07-21 LAB — VITAMIN D 25 HYDROXY (VIT D DEFICIENCY, FRACTURES): Vit D, 25-Hydroxy: 17.83 ng/mL — ABNORMAL LOW (ref 30–100)

## 2019-07-21 LAB — IRON AND TIBC
Iron: 33 ug/dL (ref 28–170)
Saturation Ratios: 14 % (ref 10.4–31.8)
TIBC: 244 ug/dL — ABNORMAL LOW (ref 250–450)
UIBC: 211 ug/dL

## 2019-07-21 LAB — CK: Total CK: 1403 U/L — ABNORMAL HIGH (ref 38–234)

## 2019-07-21 LAB — UREA NITROGEN, URINE: Urea Nitrogen, Ur: 370 mg/dL

## 2019-07-21 LAB — FERRITIN: Ferritin: 15 ng/mL (ref 11–307)

## 2019-07-21 MED ORDER — CHLORHEXIDINE GLUCONATE CLOTH 2 % EX PADS
6.0000 | MEDICATED_PAD | Freq: Every day | CUTANEOUS | Status: DC
  Administered 2019-07-22 – 2019-07-24 (×4): 6 via TOPICAL

## 2019-07-21 MED ORDER — VITAMIN D (ERGOCALCIFEROL) 1.25 MG (50000 UNIT) PO CAPS
50000.0000 [IU] | ORAL_CAPSULE | ORAL | Status: DC
  Administered 2019-07-21: 50000 [IU] via ORAL
  Filled 2019-07-21 (×2): qty 1

## 2019-07-21 MED ORDER — CALCIUM GLUCONATE-NACL 1-0.675 GM/50ML-% IV SOLN
1.0000 g | INTRAVENOUS | Status: AC
  Administered 2019-07-21 (×2): 1000 mg via INTRAVENOUS
  Filled 2019-07-21 (×2): qty 50

## 2019-07-21 MED ORDER — OXYCODONE HCL 5 MG PO TABS
2.5000 mg | ORAL_TABLET | Freq: Four times a day (QID) | ORAL | Status: DC | PRN
  Administered 2019-07-21 (×2): 5 mg via ORAL
  Filled 2019-07-21 (×2): qty 1

## 2019-07-21 NOTE — Evaluation (Signed)
Physical Therapy Evaluation Patient Details Name: [name] MRN: [id number] DOB: Mar 19, 1966 Today's Date: 07/21/2019   History of Present Illness  54 year old female with history of epilepsy, hypertension, bipolar disorder, CKD, presented to the hospital with altered mentation and confusion and found to have severe hypocalcemia and AKI with an elevated CPK level.    Clinical Impression  Pt admitted with above diagnosis. Patient agreeable to participation in PT evaluation. For safety due to patient's altered mental status, patient required min guard for all upright mobility. Patient did exhibited difficulty with expressive language during subjective evaluation. Patient also exhibited lability 3 times during evaluation. During ambulation with IV pole, patient had a tendency to reach for hallway handrailing to hold onto refusing therapists hand. Also during ambulation, patient described feeling "weird" like she "needed to sit in the middle of the hallway right away." Patient denied dizziness at this time however. Pt currently with functional limitations due to the deficits listed below (see PT Problem List). Pt will benefit from skilled PT to increase their independence and safety with mobility to allow discharge to the venue listed below.       Follow Up Recommendations Home health PT;Supervision for mobility/OOB    Equipment Recommendations  Rolling walker with 5" wheels    Recommendations for Other Services       Precautions / Restrictions Precautions Precautions: Fall Restrictions Weight Bearing Restrictions: No      Mobility  Bed Mobility Overal bed mobility: Modified Independent                Transfers Overall transfer level: Needs assistance Equipment used: Ambulation equipment used Transfers: Sit to/from Stand;Stand Pivot Transfers Sit to Stand: Min guard;Supervision Stand pivot transfers: Min guard;Supervision       General transfer comment: min guard for  safety due to altered mental status  Ambulation/Gait Ambulation/Gait assistance: Min guard Gait Distance (Feet): 100 Feet Assistive device: IV Pole Gait Pattern/deviations: Step-through pattern;Decreased step length - right;Decreased step length - left;Decreased stride length Gait velocity: decreased   General Gait Details: slow, labored gait with tendency to reach for handrail in hallway; 1 episode of patient having an "odd" feeling while ambulating and the sensation she wanted to sit down in the hallway. Denied dizziness.  Stairs            Wheelchair Mobility    Modified Rankin (Stroke Patients Only)       Balance Overall balance assessment: Needs assistance Sitting-balance support: Bilateral upper extremity supported;Feet supported Sitting balance-Leahy Scale: Good     Standing balance support: During functional activity;Bilateral upper extremity supported Standing balance-Leahy Scale: Fair Standing balance comment: fair with IV pole and handrail                             Pertinent Vitals/Pain Pain Assessment: 0-10 Pain Score: 6  Pain Location: placement of; left jaw Pain Descriptors / Indicators: Sore Pain Intervention(s): Limited activity within patient's tolerance;Monitored during session    Home Living Family/patient expects to be discharged to:: Private residence Living Arrangements: Alone   Type of Home: Apartment Home Access: Stairs to enter Entrance Stairs-Rails: Right;Left;Can reach both Entrance Stairs-Number of Steps: 16 Home Layout: One level Home Equipment: Walker - 2 wheels;Hand held shower head      Prior Function Level of Independence: Independent         Comments: patient not sure how long she has not been driving; has her groceries delivered  Hand Dominance   Dominant Hand: Right    Extremity/Trunk Assessment   Upper Extremity Assessment Upper Extremity Assessment: Overall WFL for tasks assessed    Lower  Extremity Assessment Lower Extremity Assessment: Overall WFL for tasks assessed    Cervical / Trunk Assessment Cervical / Trunk Assessment: Normal  Communication   Communication: Expressive difficulties(patient reports noticing getting oercome with emotions starting this past Friday.)  Cognition Arousal/Alertness: Awake/alert Behavior During Therapy: WFL for tasks assessed/performed Overall Cognitive Status: No family/caregiver present to determine baseline cognitive functioning                                        General Comments      Exercises     Assessment/Plan    PT Assessment Patient needs continued PT services  PT Problem List Decreased activity tolerance;Decreased balance;Decreased mobility       PT Treatment Interventions DME instruction;Therapeutic activities;Gait training;Therapeutic exercise;Patient/family education;Balance training    PT Goals (Current goals can be found in the Care Plan section)  Acute Rehab PT Goals Patient Stated Goal: Figure out what is wrong with her. PT Goal Formulation: With patient Time For Goal Achievement: 08/04/19 Potential to Achieve Goals: Fair    Frequency Min 3X/week   Barriers to discharge        Co-evaluation               AM-PAC PT "6 Clicks" Mobility  Outcome Measure Help needed turning from your back to your side while in a flat bed without using bedrails?: None Help needed moving from lying on your back to sitting on the side of a flat bed without using bedrails?: A Little Help needed moving to and from a bed to a chair (including a wheelchair)?: A Little Help needed standing up from a chair using your arms (e.g., wheelchair or bedside chair)?: A Little Help needed to walk in hospital room?: A Little Help needed climbing 3-5 steps with a railing? : A Little 6 Click Score: 19    End of Session Equipment Utilized During Treatment: Gait belt Activity Tolerance: Patient tolerated treatment  well;Patient limited by fatigue Patient left: in bed;with call bell/phone within reach Nurse Communication: Mobility status PT Visit Diagnosis: Unsteadiness on feet (R26.81);Other abnormalities of gait and mobility (R26.89)    Time: 6599-3570 PT Time Calculation (min) (ACUTE ONLY): 30 min   Charges:   PT Evaluation $PT Eval Low Complexity: 1 Low PT Treatments $Gait Training: 8-22 mins        Floria Raveling. Hartnett-Rands, MS, PT Per [place] #[id number] 07/21/2019, 4:54 PM

## 2019-07-21 NOTE — Progress Notes (Signed)
PROGRESS NOTE   [name]  GGY:[id number] DOB: 23-Aug-1965 DOA: 07/19/2019 PCP: Pixie Casino, MD   Chief Complaint  Patient presents with  . Altered Mental Status    Brief Narrative:  54 year old female with history of epilepsy, hypertension, bipolar disorder, CKD, presented to the hospital with altered mentation and confusion and found to have severe hypocalcemia and AKI with an elevated CPK level. Assessment & Plan:   Active Problems:   Bipolar disorder (HCC)   Vitamin B12 deficiency (dietary) anemia   Chronic kidney disease, stage IV (severe) (HCC)   Herpes simplex vulvovaginitis   Seizure (HCC)   Acute renal failure superimposed on stage 4 chronic kidney disease (HCC)   B12 deficiency   Vitamin D deficiency   Hypocalcemia   1. Acute renal failure on CKD stage IV-patient has reported history of poor compliance with medical treatments possibly related to her bipolar disorder.  She seems to be responding to IV fluid hydration and her renal function is slowly improving.  Appreciate assistance of the nephrology service as the Ringer's lactated solution given IV seems to be helping. 2. Epilepsy-resume Keppra as ordered. 3. Vitamin D deficiency-oral replacement ordered. 4. B12 deficiency-check B12 level. 5. Hypocalcemia-she remains hypocalcemic ordered additional IV calcium and oral calcium replacements.  Recheck in a.m. 6. HSV carrier-patient is on suppressive therapy with Valtrex which we have continued. 7. Bipolar disorder-currently stable she has been resumed on all of her home medications.   DVT prophylaxis: Subcu heparin Code Status: Full Family Communication: Updated patient's sister Miriel by telephone Disposition:   Status is: Inpatient  Remains inpatient appropriate because:IV treatments appropriate due to intensity of illness or inability to take PO   Dispo: The patient is from: Home              Anticipated d/c is to: Home              Anticipated d/c  date is: 3 days              Patient currently is not medically stable to d/c.    Consultants:   Nephrology  Procedures:     Antimicrobials:     Subjective: Patient says she is feeling a little better today.  She still has very poor appetite.  She is denying any other symptoms today.  Objective: Vitals:   07/20/19 1600 07/20/19 2114 07/21/19 0507 07/21/19 1427  BP: 116/76 (!) 149/91 (!) 136/99 (!) 154/104  Pulse: 81 80 94 89  Resp: 16 16 16 16   Temp: 98.3 F (36.8 C) 98.6 F (37 C) 98.4 F (36.9 C) 98.4 F (36.9 C)  TempSrc: Oral Oral Oral Oral  SpO2: 100% 100% 100% 100%  Weight:      Height:        Intake/Output Summary (Last 24 hours) at 07/21/2019 1601 Last data filed at 07/21/2019 0427 Gross per 24 hour  Intake 1690.92 ml  Output 800 ml  Net 890.92 ml   Filed Weights   07/19/19 1849  Weight: 77.1 kg    Examination:  General exam: Appears calm and comfortable mucous membranes remain dry and pale. Respiratory system: Clear to auscultation. Respiratory effort normal. Cardiovascular system: S1 & S2 heard, RRR. No JVD, murmurs, rubs, gallops or clicks. No pedal edema. Gastrointestinal system: Abdomen is nondistended, soft and nontender. No organomegaly or masses felt. Normal bowel sounds heard. Central nervous system: Alert and oriented. No focal neurological deficits. Extremities: Symmetric 5 x 5 power. Skin: No rashes, lesions  or ulcers Psychiatry: Judgement and insight appear normal. Mood & affect appropriate.   Data Reviewed: I have personally reviewed following labs and imaging studies  CBC: Recent Labs  Lab 07/19/19 2015 07/20/19 0630 07/21/19 0613  WBC 5.8 5.4 4.6  NEUTROABS 4.2  --  2.9  HGB 8.5* 8.1* 7.8*  HCT 27.5* 25.8* 25.1*  MCV 95.2 94.5 96.2  PLT 164 160 127    Basic Metabolic Panel: Recent Labs  Lab 07/19/19 2015 07/20/19 0330 07/20/19 0630 07/21/19 0613  NA 135  --  137 140  K 3.6  --  3.5 3.5  CL 103  --  105 110  CO2  17*  --  16* 19*  GLUCOSE 86  --  79 82  BUN 98*  --  96* 85*  CREATININE 7.21*  --  6.71* 5.99*  CALCIUM 5.4*  --  5.3* 5.2*  MG 1.8  --   --   --   PHOS  --  7.5*  --  5.6*    GFR: Estimated Creatinine Clearance: 9.7 mL/min (A) (by C-G formula based on SCr of 5.99 mg/dL (H)).  Liver Function Tests: Recent Labs  Lab 07/19/19 2015 07/21/19 0613  AST 29  --   ALT 15  --   ALKPHOS 122  --   BILITOT 0.8  --   PROT 6.6  --   ALBUMIN 3.3* 3.0*    CBG: No results for input(s): GLUCAP in the last 168 hours.   Recent Results (from the past 240 hour(s))  Respiratory Panel by RT PCR (Flu A&B, Covid) - Nasopharyngeal Swab     Status: None   Collection Time: 07/19/19 11:00 PM   Specimen: Nasopharyngeal Swab  Result Value Ref Range Status   SARS Coronavirus 2 by RT PCR NEGATIVE NEGATIVE Final    Comment: (NOTE) SARS-CoV-2 target nucleic acids are NOT DETECTED. The SARS-CoV-2 RNA is generally detectable in upper respiratoy specimens during the acute phase of infection. The lowest concentration of SARS-CoV-2 viral copies this assay can detect is 131 copies/mL. A negative result does not preclude SARS-Cov-2 infection and should not be used as the sole basis for treatment or other patient management decisions. A negative result may occur with  improper specimen collection/handling, submission of specimen other than nasopharyngeal swab, presence of viral mutation(s) within the areas targeted by this assay, and inadequate number of viral copies (<131 copies/mL). A negative result must be combined with clinical observations, patient history, and epidemiological information. The expected result is Negative. Fact Sheet for Patients:  PinkCheek.be Fact Sheet for Healthcare Providers:  GravelBags.it This test is not yet ap proved or cleared by the [place] FDA and  has been authorized for detection and/or diagnosis of  SARS-CoV-2 by FDA under an Emergency Use Authorization (EUA). This EUA will remain  in effect (meaning this test can be used) for the duration of the COVID-19 declaration under Section 564(b)(1) of the Act, 21 U.S.C. section 360bbb-3(b)(1), unless the authorization is terminated or revoked sooner.    Influenza A by PCR NEGATIVE NEGATIVE Final   Influenza B by PCR NEGATIVE NEGATIVE Final    Comment: (NOTE) The Xpert Xpress SARS-CoV-2/FLU/RSV assay is intended as an aid in  the diagnosis of influenza from Nasopharyngeal swab specimens and  should not be used as a sole basis for treatment. Nasal washings and  aspirates are unacceptable for Xpert Xpress SARS-CoV-2/FLU/RSV  testing. Fact Sheet for Patients: PinkCheek.be Fact Sheet for Healthcare Providers: GravelBags.it This test is not  yet approved or cleared by the [place] and  has been authorized for detection and/or diagnosis of SARS-CoV-2 by  FDA under an Emergency Use Authorization (EUA). This EUA will remain  in effect (meaning this test can be used) for the duration of the  Covid-19 declaration under Section 564(b)(1) of the Act, 21  U.S.C. section 360bbb-3(b)(1), unless the authorization is  terminated or revoked. Performed at [facility], [address], [place], [place] [address]     Radiology Studies: CT Head Wo Contrast  Result Date: 07/19/2019 CLINICAL DATA:  Altered mental status. EXAM: CT HEAD WITHOUT CONTRAST TECHNIQUE: Contiguous axial images were obtained from the base of the skull through the vertex without intravenous contrast. COMPARISON:  September 08, 2018 FINDINGS: Brain: No evidence of acute infarction, hemorrhage, hydrocephalus, extra-axial collection or mass lesion/mass effect. Stable extensive white matter hypoattenuation consistent with advanced small vessel disease. Vascular: No hyperdense vessel or unexpected calcification. Skull: Normal.  Negative for fracture or focal lesion. Sinuses/Orbits: No acute finding. Other: None. IMPRESSION: No acute intracranial pathology. Electronically Signed   By: Virgina Norfolk M.D.   On: 07/19/2019 22:19    Scheduled Meds: . aspirin EC  81 mg Oral Daily  . calcitRIOL  0.25 mcg Oral Daily  . calcium carbonate  1,000 mg of elemental calcium Oral QHS  . calcium carbonate  1 tablet Oral TID WC  . carvedilol  25 mg Oral BID WC  . Ferrous Fumarate  1 tablet Oral Daily  . heparin injection (subcutaneous)  5,000 Units Subcutaneous Q8H  . levETIRAcetam  500 mg Oral Daily  . sodium bicarbonate  650 mg Oral BID  . valACYclovir  500 mg Oral Daily  . Vitamin D (Ergocalciferol)  50,000 Units Oral Q7 days   Continuous Infusions: . lactated ringers 100 mL/hr at 07/21/19 1057     LOS: 1 day    Time spent: 23 mins  Naylani Bradner Wynetta Emery, MD [facility]   To contact the attending provider between 7A-7P or the covering provider during after hours 7P-7A, please log into the web site [email or web address] and access using universal [place] password for that web site. If you do not have the password, please call the hospital operator.  07/21/2019, 4:01 PM

## 2019-07-21 NOTE — Plan of Care (Signed)
  Problem: Acute Rehab PT Goals(only PT should resolve) Goal: Pt Will Go Supine/Side To Sit Outcome: Progressing Flowsheets (Taken 07/21/2019 1658) Pt will go Supine/Side to Sit: with supervision Goal: Pt Will Go Sit To Supine/Side Outcome: Progressing Flowsheets (Taken 07/21/2019 1658) Pt will go Sit to Supine/Side: with supervision Goal: Patient Will Transfer Sit To/From Stand Outcome: Progressing Flowsheets (Taken 07/21/2019 1658) Patient will transfer sit to/from stand: with supervision Goal: Pt Will Transfer Bed To Chair/Chair To Bed Outcome: Progressing Flowsheets (Taken 07/21/2019 1658) Pt will Transfer Bed to Chair/Chair to Bed: with supervision Goal: Pt Will Ambulate Outcome: Progressing Flowsheets (Taken 07/21/2019 1658) Pt will Ambulate:  > 125 feet  with supervision  with least restrictive assistive device Goal: Pt Will Go Up/Down Stairs Outcome: Progressing Flowsheets (Taken 07/21/2019 1658) Pt will Go Up / Down Stairs:  Flight  with least restrictive assistive device  with min guard assist  Pamala Hurry D. Hartnett-Rands, MS, PT Per [place] [phone] 07/21/2019

## 2019-07-21 NOTE — Progress Notes (Signed)
PT Cancellation Note  Patient Details Name: [name] MRN: [id number] DOB: [birth year]   Cancelled Treatment:    Reason Eval/Treat Not Completed: Medical issues which prohibited therapy. After chart review, PT consulted with nursing. PT will attempt evaluation tomorrow.   Floria Raveling. Hartnett-Rands, MS, PT Per [place] #[id number] 07/20/2019, 4:38 PM

## 2019-07-21 NOTE — Progress Notes (Addendum)
CRITICAL VALUE ALERT  Critical Value:  Calcium 5.2  Date & Time Notied:  07/21/2019 0211  Provider Notified: Dr. Wynetta Emery  Orders Received/Actions taken: Dr. Wynetta Emery in the room to assess patient, no new orders received.   60- Orders received for calcium gluconate 1G x2 doses

## 2019-07-22 LAB — CBC WITH DIFFERENTIAL/PLATELET
Abs Immature Granulocytes: 0.02 10*3/uL (ref 0.00–0.07)
Basophils Absolute: 0 10*3/uL (ref 0.0–0.1)
Basophils Relative: 1 %
Eosinophils Absolute: 0 10*3/uL (ref 0.0–0.5)
Eosinophils Relative: 0 %
HCT: 25.4 % — ABNORMAL LOW (ref 36.0–46.0)
Hemoglobin: 7.6 g/dL — ABNORMAL LOW (ref 12.0–15.0)
Immature Granulocytes: 1 %
Lymphocytes Relative: 28 %
Lymphs Abs: 1.2 10*3/uL (ref 0.7–4.0)
MCH: 28.8 pg (ref 26.0–34.0)
MCHC: 29.9 g/dL — ABNORMAL LOW (ref 30.0–36.0)
MCV: 96.2 fL (ref 80.0–100.0)
Monocytes Absolute: 0.6 10*3/uL (ref 0.1–1.0)
Monocytes Relative: 13 %
Neutro Abs: 2.5 10*3/uL (ref 1.7–7.7)
Neutrophils Relative %: 57 %
Platelets: 145 10*3/uL — ABNORMAL LOW (ref 150–400)
RBC: 2.64 MIL/uL — ABNORMAL LOW (ref 3.87–5.11)
RDW: 14.2 % (ref 11.5–15.5)
WBC: 4.3 10*3/uL (ref 4.0–10.5)
nRBC: 0 % (ref 0.0–0.2)

## 2019-07-22 LAB — RENAL FUNCTION PANEL
Albumin: 2.9 g/dL — ABNORMAL LOW (ref 3.5–5.0)
Anion gap: 12 (ref 5–15)
BUN: 76 mg/dL — ABNORMAL HIGH (ref 6–20)
CO2: 18 mmol/L — ABNORMAL LOW (ref 22–32)
Calcium: 5.4 mg/dL — CL (ref 8.9–10.3)
Chloride: 111 mmol/L (ref 98–111)
Creatinine, Ser: 5.58 mg/dL — ABNORMAL HIGH (ref 0.44–1.00)
GFR calc Af Amer: 9 mL/min — ABNORMAL LOW (ref >60)
GFR calc non Af Amer: 8 mL/min — ABNORMAL LOW (ref >60)
Glucose, Bld: 83 mg/dL (ref 70–99)
Phosphorus: 5.5 mg/dL — ABNORMAL HIGH (ref 2.5–4.6)
Potassium: 3.5 mmol/L (ref 3.5–5.1)
Sodium: 141 mmol/L (ref 135–145)

## 2019-07-22 LAB — CK: Total CK: 1008 U/L — ABNORMAL HIGH (ref 38–234)

## 2019-07-22 LAB — VITAMIN B12: Vitamin B-12: 957 pg/mL — ABNORMAL HIGH (ref 180–914)

## 2019-07-22 LAB — CALCIUM, IONIZED: Calcium, Ionized, Serum: 3 mg/dL — ABNORMAL LOW (ref 4.5–5.6)

## 2019-07-22 LAB — MAGNESIUM: Magnesium: 1.4 mg/dL — ABNORMAL LOW (ref 1.7–2.4)

## 2019-07-22 LAB — PARATHYROID HORMONE, INTACT (NO CA): PTH: 722 pg/mL — ABNORMAL HIGH (ref 15–65)

## 2019-07-22 MED ORDER — CALCIUM GLUCONATE-NACL 1-0.675 GM/50ML-% IV SOLN
1.0000 g | Freq: Once | INTRAVENOUS | Status: AC
  Administered 2019-07-22: 1000 mg via INTRAVENOUS
  Filled 2019-07-22: qty 50

## 2019-07-22 MED ORDER — LABETALOL HCL 5 MG/ML IV SOLN
10.0000 mg | INTRAVENOUS | Status: DC | PRN
  Administered 2019-07-22 – 2019-07-24 (×4): 10 mg via INTRAVENOUS
  Filled 2019-07-22 (×4): qty 4

## 2019-07-22 MED ORDER — MAGNESIUM SULFATE 2 GM/50ML IV SOLN
2.0000 g | Freq: Once | INTRAVENOUS | Status: AC
  Administered 2019-07-22: 2 g via INTRAVENOUS
  Filled 2019-07-22: qty 50

## 2019-07-22 MED ORDER — CALCITRIOL 0.25 MCG PO CAPS
0.5000 ug | ORAL_CAPSULE | Freq: Every day | ORAL | Status: DC
  Administered 2019-07-22 – 2019-07-25 (×4): 0.5 ug via ORAL
  Filled 2019-07-22 (×3): qty 2

## 2019-07-22 MED ORDER — VITAMIN D 25 MCG (1000 UNIT) PO TABS
2000.0000 [IU] | ORAL_TABLET | Freq: Every day | ORAL | Status: DC
  Administered 2019-07-22 – 2019-07-23 (×2): 2000 [IU] via ORAL
  Filled 2019-07-22 (×3): qty 2

## 2019-07-22 MED ORDER — CALCIUM CARBONATE 1250 (500 CA) MG PO TABS
1000.0000 mg | ORAL_TABLET | Freq: Three times a day (TID) | ORAL | Status: DC
  Administered 2019-07-22 – 2019-07-25 (×11): 1000 mg via ORAL
  Filled 2019-07-22 (×11): qty 1

## 2019-07-22 MED ORDER — SODIUM CHLORIDE 0.9 % IV SOLN
510.0000 mg | INTRAVENOUS | Status: DC
  Administered 2019-07-22: 510 mg via INTRAVENOUS
  Filled 2019-07-22: qty 17

## 2019-07-22 MED ORDER — CALCIUM GLUCONATE-NACL 2-0.675 GM/100ML-% IV SOLN: 2.0000 g | Freq: Once | INTRAVENOUS | Status: DC

## 2019-07-22 NOTE — Progress Notes (Signed)
PROGRESS NOTE   [name]  QZE:[id number] DOB: January 31, 1966 DOA: 07/19/2019 PCP: Pixie Casino, MD   Chief Complaint  Patient presents with  . Altered Mental Status    Brief Narrative:  54 year old female with history of epilepsy, hypertension, bipolar disorder, CKD, presented to the hospital with altered mentation and confusion and found to have severe hypocalcemia and AKI with an elevated CPK level. Assessment & Plan:   Active Problems:   Bipolar disorder (HCC)   Vitamin B12 deficiency (dietary) anemia   Chronic kidney disease, stage IV (severe) (HCC)   Herpes simplex vulvovaginitis   Seizure (HCC)   Acute renal failure superimposed on stage 4 chronic kidney disease (HCC)   B12 deficiency   Vitamin D deficiency   Hypocalcemia  1. Acute renal failure on CKD stage IV-patient has reported history of poor compliance with medical treatments possibly related to her bipolar disorder.  She seems to be responding to IV fluid hydration and her renal function is slowly improving.  Appreciate assistance of the nephrology service as the Ringer's lactated solution given IV seems to be helping.  Nephrology plans to continue IV fluids for 1 more day.  Renal function slowly improving.  2. Epilepsy-continue home dose Keppra as ordered. 3. Vitamin D deficiency-oral replacement ordered. 4. B12 deficiency-awaiting B12 level. 5. Hypocalcemia-she remains hypocalcemic, nephrology ordered additional calcium supplement.  Recheck in a.m. 6. Hypomagnesemia - IV replacement ordered, recheck in AM.  7. HSV carrier-patient is on suppressive therapy with Valtrex which we have continued. 8. Bipolar disorder-currently stable she has been resumed on all of her home medications.   DVT prophylaxis: Subcu heparin Code Status: Full Family Communication: Updated patient's sister Miriel by telephone Disposition:   Status is: Inpatient  Remains inpatient appropriate because:IV treatments appropriate due to  intensity of illness or inability to take PO   Dispo: The patient is from: Home              Anticipated d/c is to: Home              Anticipated d/c date is: 1-2 days              Patient currently is not medically stable to d/c.    Consultants:   Nephrology  Procedures:     Antimicrobials:     Subjective: Patient upset about her home finances, denies CP and SOB, eating very well. No emesis.  No diarrhea.   Objective: Vitals:   07/21/19 2100 07/22/19 0548 07/22/19 0719 07/22/19 0740  BP: 139/88 128/79    Pulse: 90 85    Resp: 18 20    Temp: 98 F (36.7 C) 98.6 F (37 C)    TempSrc: Oral Oral    SpO2: 100% 100%  98%  Weight:   82.5 kg   Height:        Intake/Output Summary (Last 24 hours) at 07/22/2019 1049 Last data filed at 07/22/2019 0549 Gross per 24 hour  Intake 240 ml  Output 1950 ml  Net -1710 ml   Filed Weights   07/19/19 1849 07/22/19 0719  Weight: 77.1 kg 82.5 kg    Examination:  General exam: Appears calm and comfortable mucous membranes remain dry and pale. Respiratory system: Clear to auscultation. Respiratory effort normal. Cardiovascular system: S1 & S2 heard, RRR. No JVD, murmurs, rubs, gallops or clicks. No pedal edema. Gastrointestinal system: Abdomen is nondistended, soft and nontender. No organomegaly or masses felt. Normal bowel sounds heard. Central nervous system: Alert  and oriented. No focal neurological deficits. Extremities: Symmetric 5 x 5 power. Skin: No rashes, lesions or ulcers Psychiatry: Judgement and insight appear normal. Mood & affect appropriate.   Data Reviewed: I have personally reviewed following labs and imaging studies  CBC: Recent Labs  Lab 07/19/19 2015 07/20/19 0630 07/21/19 0613 07/22/19 0515  WBC 5.8 5.4 4.6 4.3  NEUTROABS 4.2  --  2.9 2.5  HGB 8.5* 8.1* 7.8* 7.6*  HCT 27.5* 25.8* 25.1* 25.4*  MCV 95.2 94.5 96.2 96.2  PLT 164 160 163 145*    Basic Metabolic Panel: Recent Labs  Lab 07/19/19 2015  07/20/19 0330 07/20/19 0630 07/21/19 0613 07/22/19 0514 07/22/19 0515  NA 135  --  137 140 141  --   K 3.6  --  3.5 3.5 3.5  --   CL 103  --  105 110 111  --   CO2 17*  --  16* 19* 18*  --   GLUCOSE 86  --  79 82 83  --   BUN 98*  --  96* 85* 76*  --   CREATININE 7.21*  --  6.71* 5.99* 5.58*  --   CALCIUM 5.4*  --  5.3* 5.2* 5.4*  --   MG 1.8  --   --   --   --  1.4*  PHOS  --  7.5*  --  5.6* 5.5*  --     GFR: Estimated Creatinine Clearance: 10.8 mL/min (A) (by C-G formula based on SCr of 5.58 mg/dL (H)).  Liver Function Tests: Recent Labs  Lab 07/19/19 2015 07/21/19 0613 07/22/19 0514  AST 29  --   --   ALT 15  --   --   ALKPHOS 122  --   --   BILITOT 0.8  --   --   PROT 6.6  --   --   ALBUMIN 3.3* 3.0* 2.9*    CBG: No results for input(s): GLUCAP in the last 168 hours.   Recent Results (from the past 240 hour(s))  Respiratory Panel by RT PCR (Flu A&B, Covid) - Nasopharyngeal Swab     Status: None   Collection Time: 07/19/19 11:00 PM   Specimen: Nasopharyngeal Swab  Result Value Ref Range Status   SARS Coronavirus 2 by RT PCR NEGATIVE NEGATIVE Final    Comment: (NOTE) SARS-CoV-2 target nucleic acids are NOT DETECTED. The SARS-CoV-2 RNA is generally detectable in upper respiratoy specimens during the acute phase of infection. The lowest concentration of SARS-CoV-2 viral copies this assay can detect is 131 copies/mL. A negative result does not preclude SARS-Cov-2 infection and should not be used as the sole basis for treatment or other patient management decisions. A negative result may occur with  improper specimen collection/handling, submission of specimen other than nasopharyngeal swab, presence of viral mutation(s) within the areas targeted by this assay, and inadequate number of viral copies (<131 copies/mL). A negative result must be combined with clinical observations, patient history, and epidemiological information. The expected result is  Negative. Fact Sheet for Patients:  PinkCheek.be Fact Sheet for Healthcare Providers:  GravelBags.it This test is not yet ap proved or cleared by the [place] FDA and  has been authorized for detection and/or diagnosis of SARS-CoV-2 by FDA under an Emergency Use Authorization (EUA). This EUA will remain  in effect (meaning this test can be used) for the duration of the COVID-19 declaration under Section 564(b)(1) of the Act, 21 U.S.C. section 360bbb-3(b)(1), unless the authorization is terminated or  revoked sooner.    Influenza A by PCR NEGATIVE NEGATIVE Final   Influenza B by PCR NEGATIVE NEGATIVE Final    Comment: (NOTE) The Xpert Xpress SARS-CoV-2/FLU/RSV assay is intended as an aid in  the diagnosis of influenza from Nasopharyngeal swab specimens and  should not be used as a sole basis for treatment. Nasal washings and  aspirates are unacceptable for Xpert Xpress SARS-CoV-2/FLU/RSV  testing. Fact Sheet for Patients: PinkCheek.be Fact Sheet for Healthcare Providers: GravelBags.it This test is not yet approved or cleared by the [place] FDA and  has been authorized for detection and/or diagnosis of SARS-CoV-2 by  FDA under an Emergency Use Authorization (EUA). This EUA will remain  in effect (meaning this test can be used) for the duration of the  Covid-19 declaration under Section 564(b)(1) of the Act, 21  U.S.C. section 360bbb-3(b)(1), unless the authorization is  terminated or revoked. Performed at [facility], [address], [place], [place] [address]     Radiology Studies: No results found.  Scheduled Meds: . aspirin EC  81 mg Oral Daily  . calcitRIOL  0.5 mcg Oral Daily  . calcium carbonate  1,000 mg of elemental calcium Oral TID BM  . calcium carbonate  1 tablet Oral TID WC  . carvedilol  25 mg Oral BID WC  . Chlorhexidine Gluconate Cloth   6 each Topical Daily  . cholecalciferol  2,000 Units Oral Daily  . heparin injection (subcutaneous)  5,000 Units Subcutaneous Q8H  . levETIRAcetam  500 mg Oral Daily  . sodium bicarbonate  650 mg Oral BID  . valACYclovir  500 mg Oral Daily  . Vitamin D (Ergocalciferol)  50,000 Units Oral Q7 days   Continuous Infusions: . calcium gluconate     Followed by  . calcium gluconate    . ferumoxytol    . lactated ringers 100 mL/hr at 07/22/19 0957  . magnesium sulfate bolus IVPB 2 g (07/22/19 1000)     LOS: 2 days    Time spent: 21 mins  Thu Baggett Wynetta Emery, MD [facility]   To contact the attending provider between 7A-7P or the covering provider during after hours 7P-7A, please log into the web site [email or web address] and access using universal [place] password for that web site. If you do not have the password, please call the hospital operator.  07/22/2019, 10:49 AM

## 2019-07-22 NOTE — Progress Notes (Signed)
Calcium level increased from 5.2 to 5.4

## 2019-07-22 NOTE — Progress Notes (Signed)
Admit: 07/19/2019 LOS: 2  35F AoCKD4, AMS, inc CK 1900, hypocalcemia  Subjective:  . SCr/BUN downtrending, good UOP . Awake, animated this AM, says her mind isn't quite clikcing . Says she sees [place] at [employer] .  Stressed about finances . Wants to meet iwht palliatie care . On LR @100 /h . UOP 2L, K 3.5, SCr and BUN downtrending . No N/V, on RA nl SpO2 . Calcium remains low  05/02 0701 - 05/03 0700 In: 240 [P.O.:240] Out: 1950 [Urine:1950]  Filed Weights   07/19/19 1849 07/22/19 0719  Weight: 77.1 kg 82.5 kg    Scheduled Meds: . aspirin EC  81 mg Oral Daily  . calcitRIOL  0.25 mcg Oral Daily  . calcium carbonate  1,000 mg of elemental calcium Oral QHS  . calcium carbonate  1 tablet Oral TID WC  . carvedilol  25 mg Oral BID WC  . Chlorhexidine Gluconate Cloth  6 each Topical Daily  . Ferrous Fumarate  1 tablet Oral Daily  . heparin injection (subcutaneous)  5,000 Units Subcutaneous Q8H  . levETIRAcetam  500 mg Oral Daily  . sodium bicarbonate  650 mg Oral BID  . valACYclovir  500 mg Oral Daily  . Vitamin D (Ergocalciferol)  50,000 Units Oral Q7 days   Continuous Infusions: . lactated ringers 100 mL/hr at 07/21/19 1057  . magnesium sulfate bolus IVPB     PRN Meds:.acetaminophen, oxyCODONE  Current Labs: reviewed  Results for NYEMA, HACHEY (MRN [id number]) as of 07/22/2019 09:31  Ref. Range 07/21/2019 06:13  Saturation Ratios Latest Ref Range: 10.4 - 31.8 % 14  Ferritin Latest Ref Range: 11 - 307 ng/mL 15    Physical Exam:  Blood pressure 128/79, pulse 85, temperature 98.6 F (37 C), temperature source Oral, resp. rate 20, height 4' 11"  (1.499 m), weight 82.5 kg, SpO2 98 %. NAD, elderly female Awake, alert, interactive RRR  CTAB No sig LEE Nonfocal  A 1. AoCKD4: mild inc in CK possibly contributing.  Nonoliguric.  Resolving.  No HD indicated; partially volume responsive suggesting hypovolemia at presentation 2. Hypocalcemia: PTH 722, 25 Vit D = 17 3. Inc CK,  mild  Rhabdo; likely 4. Anemia; downtrending with hydration;  5. AG Met acidosis; stable, mild, CTM 6. AMS, seems improving   P . Cont IVF for another 24h . Inc Ca Carbonate (not meal time dosing) for #2 . Inc C3 to 0.60mg/d . Start 2000 Cholecalciferol / d . 2gm Cal Gluc now . Fereheme today; might req ESA Support also . Medication Issues; o Preferred narcotic agents for pain control are hydromorphone, fentanyl, and methadone. Morphine should not be used.  o Baclofen should be avoided o Avoid oral sodium phosphate and magnesium citrate based laxatives / bowel preps    RPearson GrippeMD 07/22/2019, 9:29 AM  Recent Labs  Lab 07/19/19 2015 07/20/19 0330 07/20/19 0630 07/21/19 0613 07/22/19 0514  NA   < >  --  137 140 141  K   < >  --  3.5 3.5 3.5  CL   < >  --  105 110 111  CO2   < >  --  16* 19* 18*  GLUCOSE   < >  --  79 82 83  BUN   < >  --  96* 85* 76*  CREATININE   < >  --  6.71* 5.99* 5.58*  CALCIUM   < >  --  5.3* 5.2* 5.4*  PHOS  --  7.5*  --  5.6* 5.5*   < > = values in this interval not displayed.   Recent Labs  Lab 07/19/19 2015 07/19/19 2015 07/20/19 0630 07/21/19 0613 07/22/19 0515  WBC 5.8   < > 5.4 4.6 4.3  NEUTROABS 4.2  --   --  2.9 2.5  HGB 8.5*   < > 8.1* 7.8* 7.6*  HCT 27.5*   < > 25.8* 25.1* 25.4*  MCV 95.2   < > 94.5 96.2 96.2  PLT 164   < > 160 163 145*   < > = values in this interval not displayed.

## 2019-07-22 NOTE — Consult Note (Addendum)
Nicole A. Merlene Laughter, MD     [email or web address]          [name] is an 54 y.o. female.   ASSESSMENT/PLAN: 1. ACUTE ENCEPHALOPATHY LIKELY DUE TO METABOLIC DISTURBANCE.  Patient does have a history of seizures and could potentially also have nonconvulsive epileptic events. Consequently, an EEG is recommended. We will continue with her current antiepileptic medications.  2. Epilepsy at baseline   3. Likely incidental myelopathy on exam.   This is a 54 year old black female who presents with confusion and unresponsiveness over last few days. She has had multiple metabolic derangements Including acute on chronic renal failure. No clinical seizures are reported. No clear evidence of focal neurological deficits. Review of systems unobtainable given the altered mental status.   GENERAL:  This is an overweight lady who is laying in bed with eyes closed. She is in no acute distress at this time.  HEENT:  Neck is supple no trauma appreciated.  ABDOMEN: soft  EXTREMITIES: No edema   BACK: Normal  SKIN: Normal by inspection.    MENTAL STATUS:  She opens her eyes to verbal commands. She does follow midline and appendicular commands. She require is constant stimulation to keep her eyes open. She is not oriented other than to person.  Speech is inappropriate at times.  CRANIAL NERVES: Pupils are equal, round and reactive to light and accomodation; extra ocular movements are full, there is no significant nystagmus; visual fields are full; upper and lower facial muscles are normal in strength and symmetric, there is no flattening of the nasolabial folds; tongue is midline; uvula is midline; shoulder elevation is normal.  MOTOR: Normal tone, bulk and strength; no pronator drift.  COORDINATION: Left finger to nose is normal, right finger to nose is normal, No rest tremor; no intention tremor; no postural tremor; no bradykinesia.  REFLEXES: Deep tendon reflexes are  relatively brisk 3+ but absent at the left knee.  SENSATION: Normal to light touch, temperature, and pain.     Blood pressure (!) 169/106, pulse 92, temperature 98.6 F (37 C), temperature source Oral, resp. rate 20, height 4\' 11"  (1.499 m), weight 82.5 kg, SpO2 100 %.  Past Medical History:  Diagnosis Date  . Anemia   . Anxiety   . Bipolar disorder ([place])   . Depression   . History of blood transfusion   . Hypertension   . IUD    HISTORY OF IUD --REMOVED IN 2006  . Seizures ([place]) [id number]   two seizures due to a med changes    Past Surgical History:  Procedure Laterality Date  . CARPAL TUNNEL RELEASE Right 07/08/2014   Procedure: RIGHT CARPAL TUNNEL RELEASE;  Surgeon: Sanjuana Kava, MD;  Location: [facility];  Service: Orthopedics;  Laterality: Right;  . CHOLECYSTECTOMY    . GASTRIC BYPASS  2000  . HYSTEROSCOPY WITH D & C  11/14/2011   Procedure: DILATATION AND CURETTAGE /HYSTEROSCOPY;  Surgeon: Marylynn Pearson, MD;  Location: [facility];  Service: Gynecology;;  with removal of polyps  . INTRAUTERINE DEVICE (IUD) INSERTION  03/21/2010  . LAPAROSCOPIC GASTROTOMY W/ REPAIR OF ULCER    . PANNICULECTOMY    . ROTATOR CUFF REPAIR      Family History  Problem Relation Age of Onset  . Hypertension Mother   . Kidney failure Mother   . Hypertension Father   . Diabetes Cousin   . Diabetes Sister   . Hypertension Sister   . Kidney disease Sister  dialysis  . Hypertension Brother   . Heart attack Brother   . Hypertension Maternal Aunt   . Hypertension Maternal Uncle   . Hypertension Maternal Grandmother   . Hypertension Maternal Grandfather   . Hypertension Paternal Grandmother   . Hypertension Paternal Grandfather   . Heart attack Brother   . Hypertension Brother   . Hypertension Sister     Social History:  reports that she quit smoking about 31 years ago. Her smoking use included cigarettes. She has a 1.00 pack-year smoking history. She has never used smokeless tobacco.  She reports current alcohol use. She reports that she does not use drugs.  Allergies:  Allergies  Allergen Reactions  . Ambien [Zolpidem Tartrate]     "BLACK OUT", SLEEP DRIVING  . Geodon [Ziprasidone Hcl]     Seizure activity  . Lactulose Itching  . Ondansetron Nausea And Vomiting  . Quetiapine     Other reaction(s): Other (See Comments) Possible Hair Loss  . Sulfa Antibiotics Rash  . Zolpidem Rash    Medications: Prior to Admission medications   Medication Sig Start Date End Date Taking? Authorizing Provider  acetaminophen (TYLENOL 8 HOUR) 650 MG CR tablet Take 650 mg by mouth every 8 (eight) hours as needed for pain.   Yes [provider]  aspirin EC 81 MG tablet Take 81 mg by mouth daily.   Yes [provider]  calcitRIOL (ROCALTROL) 0.5 MCG capsule Take 0.5 mcg by mouth daily. 06/25/19  Yes [provider]  carvedilol (COREG) 25 MG tablet Take 25 mg by mouth 2 (two) times daily with a meal.   Yes [provider]  furosemide (LASIX) 80 MG tablet Take 1 tablet (80 mg total) by mouth daily. 09/12/18  Yes Tat, Shanon Brow, MD  hydrOXYzine (ATARAX/VISTARIL) 25 MG tablet Take 25 mg by mouth 2 (two) times daily. 05/30/19  Yes [provider]  levETIRAcetam (KEPPRA XR) 500 MG 24 hr tablet Take 1 tablet (500 mg total) by mouth daily. 05/30/19  Yes Cameron Sprang, MD  promethazine (PHENERGAN) 25 MG tablet Take 25 mg by mouth every 6 (six) hours as needed. 01/01/19  Yes [provider]  tiZANidine (ZANAFLEX) 4 MG tablet TAKE 1 TABLET BY MOUTH EVERY 12 HOURS AS NEEDED FOR SPASM Patient taking differently: Take 4 mg by mouth 2 (two) times daily as needed. TAKE 1 TABLET BY MOUTH EVERY 12 HOURS AS NEEDED FOR SPASM 05/10/19  Yes Sanjuana Kava, MD  traMADol (ULTRAM) 50 MG tablet Take 1 tablet (50 mg total) by mouth every 6 (six) hours as needed. 05/02/19  Yes Sanjuana Kava, MD  valACYclovir (VALTREX) 1000 MG tablet TAKE 1/2 TABLET BY MOUTH  DAILY Patient taking differently: Take 500 mg by mouth daily.  10/22/18  Yes Donnamae Jude, MD  venlafaxine XR (EFFEXOR-XR) 150 MG 24 hr capsule Take 150 mg by mouth daily with breakfast.   Yes [provider]  vitamin B-12 (CYANOCOBALAMIN) 1000 MCG tablet Take 1 tablet by mouth daily. 10/01/18  Yes [provider]  Ferrous Fumarate (HEMOCYTE - 106 MG FE) 324 (106 Fe) MG TABS tablet Take 1 tablet by mouth daily. Takes 1-2 times daily as tolerated 03/29/18   [provider]    Scheduled Meds: . aspirin EC  81 mg Oral Daily  . calcitRIOL  0.5 mcg Oral Daily  . calcium carbonate  1,000 mg of elemental calcium Oral TID BM  . calcium carbonate  1 tablet Oral TID WC  . carvedilol  25 mg Oral BID WC  . Chlorhexidine Gluconate Cloth  6 each Topical Daily  . cholecalciferol  2,000 Units Oral Daily  . heparin injection (subcutaneous)  5,000 Units Subcutaneous Q8H  . levETIRAcetam  500 mg Oral Daily  . sodium bicarbonate  650 mg Oral BID  . valACYclovir  500 mg Oral Daily  . Vitamin D (Ergocalciferol)  50,000 Units Oral Q7 days   Continuous Infusions: . ferumoxytol 510 mg (07/22/19 1423)  . lactated ringers 75 mL/hr at 07/22/19 1709   PRN Meds:.acetaminophen, labetalol, oxyCODONE     Results for orders placed or performed during the hospital encounter of 07/19/19 (from the past 48 hour(s))  Iron and TIBC     Status: Abnormal   Collection Time: 07/21/19  6:13 AM  Result Value Ref Range   Iron 33 28 - 170 ug/dL   TIBC 244 (L) 250 - 450 ug/dL   Saturation Ratios 14 10.4 - 31.8 %   UIBC 211 ug/dL    Comment: Performed at [facility], [address], [place], [place] [address]  Ferritin     Status: None   Collection Time: 07/21/19  6:13 AM  Result Value Ref Range   Ferritin 15 11 - 307 ng/mL    Comment: Performed at [facility], [address], [place], [place] [address]  Parathyroid hormone, intact (no Ca)     Status: Abnormal   Collection Time: 07/21/19  6:13 AM   Result Value Ref Range   PTH 722 (H) 15 - 65 pg/mL    Comment: (NOTE) Performed At: [facility] [place], [place] [id number] Rush Farmer MD OV:[id number]   Calcium, ionized     Status: Abnormal   Collection Time: 07/21/19  6:13 AM  Result Value Ref Range   Calcium, Ionized, Serum 3.0 (L) 4.5 - 5.6 mg/dL    Comment: (NOTE) **Results verified by repeat testing** Performed At: [facility] [place], [place] [id number] Rush Farmer MD [id number]   Renal function panel     Status: Abnormal   Collection Time: 07/21/19  6:13 AM  Result Value Ref Range   Sodium 140 135 - 145 mmol/L   Potassium 3.5 3.5 - 5.1 mmol/L   Chloride 110 98 - 111 mmol/L   CO2 19 (L) 22 - 32 mmol/L   Glucose, Bld 82 70 - 99 mg/dL    Comment: Glucose reference range applies only to samples taken after fasting for at least 8 hours.   BUN 85 (H) 6 - 20 mg/dL   Creatinine, Ser 5.99 (H) 0.44 - 1.00 mg/dL   Calcium 5.2 (LL) 8.9 - 10.3 mg/dL    Comment: CRITICAL RESULT CALLED TO, READ BACK BY AND VERIFIED WITH: DANIEL MARTIN,RN @0831  07/21/2019 KAY    Phosphorus 5.6 (H) 2.5 - 4.6 mg/dL   Albumin 3.0 (L) 3.5 - 5.0 g/dL   GFR calc non Af Amer 7 (L) >60 mL/min   GFR calc Af Amer 9 (L) >60 mL/min   Anion gap 11 5 - 15    Comment: Performed at [facility], [address], [place], [place] [address]  CBC with Differential/Platelet     Status: Abnormal   Collection Time: 07/21/19  6:13 AM  Result Value Ref Range   WBC 4.6 4.0 - 10.5 K/uL   RBC 2.61 (L) 3.87 - 5.11 MIL/uL   Hemoglobin 7.8 (L) 12.0 - 15.0 g/dL   HCT 25.1 (L) 36.0 - 46.0 %   MCV 96.2 80.0 -  100.0 fL   MCH 29.9 26.0 - 34.0 pg   MCHC 31.1 30.0 - 36.0 g/dL   RDW 14.0 11.5 - 15.5 %   Platelets 163 150 - 400 K/uL   nRBC 0.0 0.0 - 0.2 %   Neutrophils Relative % 65 %   Neutro Abs 2.9 1.7 - 7.7 K/uL   Lymphocytes Relative 21 %   Lymphs Abs 1.0 0.7 - 4.0 K/uL   Monocytes Relative 14 %   Monocytes  Absolute 0.6 0.1 - 1.0 K/uL   Eosinophils Relative 0 %   Eosinophils Absolute 0.0 0.0 - 0.5 K/uL   Basophils Relative 0 %   Basophils Absolute 0.0 0.0 - 0.1 K/uL   Immature Granulocytes 0 %   Abs Immature Granulocytes 0.02 0.00 - 0.07 K/uL    Comment: Performed at [facility], [address], [place], [place] [address]  CK     Status: Abnormal   Collection Time: 07/21/19  6:13 AM  Result Value Ref Range   Total CK 1,403 (H) 38 - 234 U/L    Comment: Performed at [facility], [address] [place], [place] [address]  VITAMIN D 25 Hydroxy (Vit-D Deficiency, Fractures)     Status: Abnormal   Collection Time: 07/21/19  6:13 AM  Result Value Ref Range   Vit D, 25-Hydroxy 17.83 (L) 30 - 100 ng/mL    Comment: (NOTE) Vitamin D deficiency has been defined by the [place]  and an Endocrine Society practice guideline as a level of serum 25-OH  vitamin D less than 20 ng/mL (1,2). The Endocrine Society went on to  further define vitamin D insufficiency as a level between 21 and 29  ng/mL (2). 1. IOM (Institute of Medicine). 2010. Dietary reference intakes for  calcium and D. [place]: The [employer]. 2. Holick MF, [place], Bischoff-Ferrari HA, et al. Evaluation,  treatment, and prevention of vitamin D deficiency: an Endocrine  Society clinical practice guideline, JCEM. 2011 Jul; 96(7): 1911-30. Performed at [facility], [place] [address], [place], [place] [address]   Renal function panel     Status: Abnormal   Collection Time: 07/22/19  5:14 AM  Result Value Ref Range   Sodium 141 135 - 145 mmol/L   Potassium 3.5 3.5 - 5.1 mmol/L   Chloride 111 98 - 111 mmol/L   CO2 18 (L) 22 - 32 mmol/L   Glucose, Bld 83 70 - 99 mg/dL    Comment: Glucose reference range applies only to samples taken after fasting for at least 8 hours.   BUN 76 (H) 6 - 20 mg/dL   Creatinine, Ser 5.58 (H) 0.44 - 1.00 mg/dL   Calcium 5.4 (LL) 8.9 - 10.3 mg/dL    Comment: CRITICAL  RESULT CALLED TO, READ BACK BY AND VERIFIED WITH: BONDURANT,R AT 6:00AM ON 07/22/19 BY [facility]    Phosphorus 5.5 (H) 2.5 - 4.6 mg/dL   Albumin 2.9 (L) 3.5 - 5.0 g/dL   GFR calc non Af Amer 8 (L) >60 mL/min   GFR calc Af Amer 9 (L) >60 mL/min   Anion gap 12 5 - 15    Comment: Performed at [facility], [address] [place], [place] [address]  CK     Status: Abnormal   Collection Time: 07/22/19  5:15 AM  Result Value Ref Range   Total CK 1,008 (H) 38 - 234 U/L    Comment: Performed at [facility], [address] [place],  [address]  CBC with Differential/Platelet     Status: Abnormal   Collection Time: 07/22/19  5:15 AM  Result Value Ref Range   WBC 4.3 4.0 - 10.5 K/uL   RBC 2.64 (L) 3.87 - 5.11 MIL/uL   Hemoglobin 7.6 (L) 12.0 - 15.0 g/dL   HCT 25.4 (L) 36.0 - 46.0 %   MCV 96.2 80.0 - 100.0 fL   MCH 28.8 26.0 - 34.0 pg   MCHC 29.9 (L) 30.0 - 36.0 g/dL   RDW 14.2 11.5 - 15.5 %   Platelets 145 (L) 150 - 400 K/uL   nRBC 0.0 0.0 - 0.2 %   Neutrophils Relative % 57 %   Neutro Abs 2.5 1.7 - 7.7 K/uL   Lymphocytes Relative 28 %   Lymphs Abs 1.2 0.7 - 4.0 K/uL   Monocytes Relative 13 %   Monocytes Absolute 0.6 0.1 - 1.0 K/uL   Eosinophils Relative 0 %   Eosinophils Absolute 0.0 0.0 - 0.5 K/uL   Basophils Relative 1 %   Basophils Absolute 0.0 0.0 - 0.1 K/uL   Immature Granulocytes 1 %   Abs Immature Granulocytes 0.02 0.00 - 0.07 K/uL    Comment: Performed at [facility], [address], [place], [place] [address]  Magnesium     Status: Abnormal   Collection Time: 07/22/19  5:15 AM  Result Value Ref Range   Magnesium 1.4 (L) 1.7 - 2.4 mg/dL    Comment: Performed at [facility], [address] [place], [place] [address]  Vitamin B12     Status: Abnormal   Collection Time: 07/22/19 11:27 AM  Result Value Ref Range   Vitamin B-12 957 (H) 180 - 914 pg/mL    Comment: (NOTE) This assay is not validated for testing neonatal or myeloproliferative syndrome specimens for  Vitamin B12 levels. Performed at [facility] [address] [place],  [address]     Studies/Results:   HEAD CT FINDINGS: Brain: No evidence of acute infarction, hemorrhage, hydrocephalus, extra-axial collection or mass lesion/mass effect. Stable extensive white matter hypoattenuation consistent with advanced small vessel disease.  Vascular: No hyperdense vessel or unexpected calcification.  Skull: Normal. Negative for fracture or focal lesion.  Sinuses/Orbits: No acute finding.  Other: None.  IMPRESSION: No acute intracranial pathology.   The head CT scan is reviewed in person issues no hypodensity, fluid collections or hyperdensity.       Kenyata Napier A. Merlene Vincent, M.D.  Diplomate, Tax adviser of Psychiatry and Neurology ( Neurology). 07/22/2019, 6:27 PM

## 2019-07-23 ENCOUNTER — Inpatient Hospital Stay (HOSPITAL_COMMUNITY)
Admit: 2019-07-23 | Discharge: 2019-07-23 | Disposition: A | Discharge status: Home / Self Care | Payer: Medicare Other | Attending: Neurology | Admitting: Neurology

## 2019-07-23 DIAGNOSIS — Z7189 Other specified counseling: Secondary | ICD-10-CM

## 2019-07-23 DIAGNOSIS — N179 Acute kidney failure, unspecified: Principal | ICD-10-CM

## 2019-07-23 DIAGNOSIS — Z515 Encounter for palliative care: Secondary | ICD-10-CM

## 2019-07-23 DIAGNOSIS — F3160 Bipolar disorder, current episode mixed, unspecified: Secondary | ICD-10-CM

## 2019-07-23 LAB — RENAL FUNCTION PANEL
Albumin: 3 g/dL — ABNORMAL LOW (ref 3.5–5.0)
Anion gap: 13 (ref 5–15)
BUN: 69 mg/dL — ABNORMAL HIGH (ref 6–20)
CO2: 19 mmol/L — ABNORMAL LOW (ref 22–32)
Calcium: 5.9 mg/dL — CL (ref 8.9–10.3)
Chloride: 108 mmol/L (ref 98–111)
Creatinine, Ser: 5.16 mg/dL — ABNORMAL HIGH (ref 0.44–1.00)
GFR calc Af Amer: 10 mL/min — ABNORMAL LOW (ref >60)
GFR calc non Af Amer: 9 mL/min — ABNORMAL LOW (ref >60)
Glucose, Bld: 99 mg/dL (ref 70–99)
Phosphorus: 4.6 mg/dL (ref 2.5–4.6)
Potassium: 3.6 mmol/L (ref 3.5–5.1)
Sodium: 140 mmol/L (ref 135–145)

## 2019-07-23 LAB — MAGNESIUM: Magnesium: 1.8 mg/dL (ref 1.7–2.4)

## 2019-07-23 LAB — LEVETIRACETAM LEVEL: Levetiracetam Lvl: 6.4 ug/mL — ABNORMAL LOW (ref 10.0–40.0)

## 2019-07-23 MED ORDER — ACETAMINOPHEN 325 MG PO TABS: 650.0000 mg | ORAL_TABLET | Freq: Three times a day (TID) | ORAL | Status: DC | PRN

## 2019-07-23 MED ORDER — CALCIUM GLUCONATE-NACL 1-0.675 GM/50ML-% IV SOLN
1.0000 g | INTRAVENOUS | Status: AC
  Administered 2019-07-23 (×2): 1000 mg via INTRAVENOUS
  Filled 2019-07-23 (×2): qty 50

## 2019-07-23 MED ORDER — CALCIUM GLUCONATE-NACL 2-0.675 GM/100ML-% IV SOLN
2.0000 g | Freq: Once | INTRAVENOUS | Status: DC
  Filled 2019-07-23: qty 100

## 2019-07-23 NOTE — Progress Notes (Signed)
Pt c/o of itching at foley insertion site. Notified Dr. Darrick Meigs to make aware. Ok to remove and in and cath as needed.

## 2019-07-23 NOTE — Progress Notes (Signed)
Physical Therapy Treatment Patient Details Name: [name] MRN: [id number] DOB: 11-09-1965 Today's Date: 07/23/2019    History of Present Illness 54 year old female with history of epilepsy, hypertension, bipolar disorder, CKD, presented to the hospital with altered mentation and confusion and found to have severe hypocalcemia and AKI with an elevated CPK level.    PT Comments    Pt impulsive with mobility this date, requires cues for safety and sequencing to reduce risk for falls. Pt tolerates gait training and therapeutic exercises with slight increased work of breathing. While performing seated LAQ, pt impulsively rocks back to kick both legs in air and bumps head on tray table that was positioned level with padded bedrail then began laughing loudly and yells "I'm not trying to knock myself out". Pt able to continue with seated and standing therapeutic exercises with good motor control and converses about TV shows she enjoys watching. Therapist repeatedly asked pt if her head hurt during session and she laughed and said "no" every time therapist asked; RN notified of incident. Pt returned to supine and requested sprite to drink, call bell in hand and bed alarm on. Patient will benefit from continued physical therapy in hospital and recommendations below to increase strength, balance, endurance for safe ADLs and gait.    Follow Up Recommendations  Home health PT;Supervision for mobility/OOB     Equipment Recommendations  Rolling walker with 5" wheels    Recommendations for Other Services       Precautions / Restrictions Precautions Precautions: Fall Restrictions Weight Bearing Restrictions: No    Mobility  Bed Mobility Overal bed mobility: Modified Independent  General bed mobility comments: slightly impulsive  Transfers Overall transfer level: Needs assistance Equipment used: None Transfers: Sit to/from Stand Sit to Stand: Supervision  General transfer comment:  slightly impulive, cue for safety  Ambulation/Gait Ambulation/Gait assistance: Supervision Gait Distance (Feet): 100 Feet Assistive device: IV Pole Gait Pattern/deviations: Step-through pattern;Decreased step length - right;Decreased step length - left;Decreased stride length Gait velocity: decreased   General Gait Details: easily distracted gait in hallway, cues for managing IV pole for steadying self to avoid kicking wheels, mild increased SOB with mobility, denies pain/dizziness   Stairs             Wheelchair Mobility    Modified Rankin (Stroke Patients Only)       Balance Overall balance assessment: Needs assistance Sitting-balance support: Bilateral upper extremity supported;Feet supported Sitting balance-Leahy Scale: Good     Standing balance support: During functional activity;Bilateral upper extremity supported Standing balance-Leahy Scale: Fair Standing balance comment: with IV pole               Cognition Arousal/Alertness: Awake/alert Behavior During Therapy: WFL for tasks assessed/performed Overall Cognitive Status: No family/caregiver present to determine baseline cognitive functioning          General Comments: Pt goes on tangents and difficult to redirect, impulsive with mobility      Exercises General Exercises - Lower Extremity Long Arc Quad: Seated;Both;15 reps Hip Flexion/Marching: Seated;Both;15 reps Toe Raises: Seated;Both;15 reps Heel Raises: Standing;Both;10 reps Mini-Sqauts: 10 reps    General Comments        Pertinent Vitals/Pain Pain Assessment: No/denies pain    Home Living   Prior Function            PT Goals (current goals can now be found in the care plan section) Acute Rehab PT Goals Patient Stated Goal: Figure out what is wrong with her. PT Goal Formulation:  With patient Time For Goal Achievement: 08/04/19 Potential to Achieve Goals: Fair Progress towards PT goals: Progressing toward goals     Frequency    Min 3X/week      PT Plan Current plan remains appropriate    Co-evaluation              AM-PAC PT "6 Clicks" Mobility   Outcome Measure  Help needed turning from your back to your side while in a flat bed without using bedrails?: None Help needed moving from lying on your back to sitting on the side of a flat bed without using bedrails?: A Little Help needed moving to and from a bed to a chair (including a wheelchair)?: A Little Help needed standing up from a chair using your arms (e.g., wheelchair or bedside chair)?: A Little Help needed to walk in hospital room?: A Little Help needed climbing 3-5 steps with a railing? : A Little 6 Click Score: 19    End of Session   Activity Tolerance: Patient tolerated treatment well Patient left: in bed;with call bell/phone within reach;with bed alarm set Nurse Communication: Mobility status PT Visit Diagnosis: Unsteadiness on feet (R26.81);Other abnormalities of gait and mobility (R26.89)     Time: 8938-1017 PT Time Calculation (min) (ACUTE ONLY): 32 min  Charges:  $Gait Training: 8-22 mins $Therapeutic Exercise: 8-22 mins                      Tori Jerimy Johanson PT, DPT 07/23/19, 2:42 PM [phone]

## 2019-07-23 NOTE — Progress Notes (Signed)
EEG complete - results pending 

## 2019-07-23 NOTE — Progress Notes (Signed)
PROGRESS NOTE   [name]  XBJ:[id number] DOB: 1965-10-08 DOA: 07/19/2019 PCP: Pixie Casino, MD   Chief Complaint  Patient presents with  . Altered Mental Status    Brief Narrative:  54 year old female with history of epilepsy, hypertension, bipolar disorder, CKD, presented to the hospital with altered mentation and confusion and found to have severe hypocalcemia and AKI with an elevated CPK level. Assessment & Plan:   Active Problems:   Bipolar disorder (HCC)   Vitamin B12 deficiency (dietary) anemia   Chronic kidney disease, stage IV (severe) (HCC)   Herpes simplex vulvovaginitis   Seizure (HCC)   Acute renal failure superimposed on stage 4 chronic kidney disease (HCC)   B12 deficiency   Vitamin D deficiency   Hypocalcemia  1. Acute renal failure on CKD stage IV-patient has reported history of poor compliance with medical treatments possibly related to her bipolar disorder.  She responded well to IV fluid hydration and her renal function is slowly improving.  IV fluids discontinued by nephrology today.  Appreciate assistance of the nephrology service.  Recheck RFP in AM.  2. Epilepsy-continue home dose Keppra as ordered.  No seizure activity.  3. Vitamin D deficiency-oral replacement ordered. 4. B12 deficiency- B12 level now within normal limits. 5. Hypocalcemia-slowly improving, she remains hypocalcemic, nephrology ordered additional calcium supplement.  Recheck in a.m. 6. Hypomagnesemia - IV replacement given and repleted.  Recheck in AM.  7. HSV carrier-patient is on suppressive therapy with Valtrex which we have continued. 8. Bipolar disorder-currently stable she has been resumed on all of her home medications.   DVT prophylaxis: Subcu heparin Code Status: Full Family Communication: Updated patient's sister Miriel by telephone Disposition:   Status is: Inpatient  Remains inpatient appropriate because:Persistent severe electrolyte disturbances and IV  treatments appropriate due to intensity of illness or inability to take PO  Dispo: The patient is from: Home              Anticipated d/c is to: Home              Anticipated d/c date is: 1 days              Patient currently is not medically stable to d/c.   Consultants:   Nephrology  Procedures:     Antimicrobials:     Subjective: Patient in better spirits today, no specific complaints.   Objective: Vitals:   07/22/19 2057 07/22/19 2256 07/23/19 0519 07/23/19 0524  BP: (!) 168/102 (!) 160/105 (!) 147/90   Pulse: 85 83 90   Resp: 20  20   Temp: 99.3 F (37.4 C)  98.7 F (37.1 C)   TempSrc: Oral  Oral   SpO2: 100%  100%   Weight:    78.2 kg  Height:        Intake/Output Summary (Last 24 hours) at 07/23/2019 1218 Last data filed at 07/23/2019 0524 Gross per 24 hour  Intake 3546.82 ml  Output 1700 ml  Net 1846.82 ml   Filed Weights   07/19/19 1849 07/22/19 0719 07/23/19 0524  Weight: 77.1 kg 82.5 kg 78.2 kg    Examination:  General exam: Appears calm and comfortable mucous membranes remain dry and pale. Respiratory system: Clear to auscultation. Respiratory effort normal. Cardiovascular system: S1 & S2 heard, RRR. No JVD, murmurs, rubs, gallops or clicks. No pedal edema. Gastrointestinal system: Abdomen is nondistended, soft and nontender. No organomegaly or masses felt. Normal bowel sounds heard. Central nervous system: Alert and oriented. No  focal neurological deficits. Extremities: Symmetric 5 x 5 power. Skin: No rashes, lesions or ulcers Psychiatry: Judgement and insight appear normal. Mood & affect appropriate.   Data Reviewed: I have personally reviewed following labs and imaging studies  CBC: Recent Labs  Lab 07/19/19 2015 07/20/19 0630 07/21/19 0613 07/22/19 0515  WBC 5.8 5.4 4.6 4.3  NEUTROABS 4.2  --  2.9 2.5  HGB 8.5* 8.1* 7.8* 7.6*  HCT 27.5* 25.8* 25.1* 25.4*  MCV 95.2 94.5 96.2 96.2  PLT 164 160 163 145*    Basic Metabolic  Panel: Recent Labs  Lab 07/19/19 2015 07/20/19 0330 07/20/19 0630 07/21/19 0613 07/22/19 0514 07/22/19 0515 07/23/19 0552  NA 135  --  137 140 141  --  140  K 3.6  --  3.5 3.5 3.5  --  3.6  CL 103  --  105 110 111  --  108  CO2 17*  --  16* 19* 18*  --  19*  GLUCOSE 86  --  79 82 83  --  99  BUN 98*  --  96* 85* 76*  --  69*  CREATININE 7.21*  --  6.71* 5.99* 5.58*  --  5.16*  CALCIUM 5.4*  --  5.3* 5.2* 5.4*  --  5.9*  MG 1.8  --   --   --   --  1.4* 1.8  PHOS  --  7.5*  --  5.6* 5.5*  --  4.6    GFR: Estimated Creatinine Clearance: 11.4 mL/min (A) (by C-G formula based on SCr of 5.16 mg/dL (H)).  Liver Function Tests: Recent Labs  Lab 07/19/19 2015 07/21/19 0613 07/22/19 0514 07/23/19 0552  AST 29  --   --   --   ALT 15  --   --   --   ALKPHOS 122  --   --   --   BILITOT 0.8  --   --   --   PROT 6.6  --   --   --   ALBUMIN 3.3* 3.0* 2.9* 3.0*    CBG: No results for input(s): GLUCAP in the last 168 hours.   Recent Results (from the past 240 hour(s))  Respiratory Panel by RT PCR (Flu A&B, Covid) - Nasopharyngeal Swab     Status: None   Collection Time: 07/19/19 11:00 PM   Specimen: Nasopharyngeal Swab  Result Value Ref Range Status   SARS Coronavirus 2 by RT PCR NEGATIVE NEGATIVE Final    Comment: (NOTE) SARS-CoV-2 target nucleic acids are NOT DETECTED. The SARS-CoV-2 RNA is generally detectable in upper respiratoy specimens during the acute phase of infection. The lowest concentration of SARS-CoV-2 viral copies this assay can detect is 131 copies/mL. A negative result does not preclude SARS-Cov-2 infection and should not be used as the sole basis for treatment or other patient management decisions. A negative result may occur with  improper specimen collection/handling, submission of specimen other than nasopharyngeal swab, presence of viral mutation(s) within the areas targeted by this assay, and inadequate number of viral copies (<131 copies/mL). A  negative result must be combined with clinical observations, patient history, and epidemiological information. The expected result is Negative. Fact Sheet for Patients:  PinkCheek.be Fact Sheet for Healthcare Providers:  GravelBags.it This test is not yet ap proved or cleared by the [place] FDA and  has been authorized for detection and/or diagnosis of SARS-CoV-2 by FDA under an Emergency Use Authorization (EUA). This EUA will remain  in effect (meaning this  test can be used) for the duration of the COVID-19 declaration under Section 564(b)(1) of the Act, 21 U.S.C. section 360bbb-3(b)(1), unless the authorization is terminated or revoked sooner.    Influenza A by PCR NEGATIVE NEGATIVE Final   Influenza B by PCR NEGATIVE NEGATIVE Final    Comment: (NOTE) The Xpert Xpress SARS-CoV-2/FLU/RSV assay is intended as an aid in  the diagnosis of influenza from Nasopharyngeal swab specimens and  should not be used as a sole basis for treatment. Nasal washings and  aspirates are unacceptable for Xpert Xpress SARS-CoV-2/FLU/RSV  testing. Fact Sheet for Patients: PinkCheek.be Fact Sheet for Healthcare Providers: GravelBags.it This test is not yet approved or cleared by the [place] FDA and  has been authorized for detection and/or diagnosis of SARS-CoV-2 by  FDA under an Emergency Use Authorization (EUA). This EUA will remain  in effect (meaning this test can be used) for the duration of the  Covid-19 declaration under Section 564(b)(1) of the Act, 21  U.S.C. section 360bbb-3(b)(1), unless the authorization is  terminated or revoked. Performed at [facility], [address], [place], [place] [address]     Radiology Studies: No results found.  Scheduled Meds: . aspirin EC  81 mg Oral Daily  . calcitRIOL  0.5 mcg Oral Daily  . calcium carbonate  1,000 mg of  elemental calcium Oral TID BM  . calcium carbonate  1 tablet Oral TID WC  . carvedilol  25 mg Oral BID WC  . Chlorhexidine Gluconate Cloth  6 each Topical Daily  . cholecalciferol  2,000 Units Oral Daily  . heparin injection (subcutaneous)  5,000 Units Subcutaneous Q8H  . levETIRAcetam  500 mg Oral Daily  . sodium bicarbonate  650 mg Oral BID  . valACYclovir  500 mg Oral Daily  . Vitamin D (Ergocalciferol)  50,000 Units Oral Q7 days   Continuous Infusions: . calcium gluconate    . ferumoxytol 510 mg (07/22/19 1423)  . lactated ringers 75 mL/hr at 07/23/19 0245     LOS: 3 days    Time spent: 18 mins  Sascha Baugher Wynetta Emery, MD [facility]   To contact the attending provider between 7A-7P or the covering provider during after hours 7P-7A, please log into the web site [email or web address] and access using universal [place] password for that web site. If you do not have the password, please call the hospital operator.  07/23/2019, 12:18 PM

## 2019-07-23 NOTE — Procedures (Signed)
Nicole A. Merlene Laughter, MD     [email or web address]           HISTORY: The patient is a 54 year old who presents with altered mental status worrisome for nonconvulsive seizures.  MEDICATIONS:  Current Facility-Administered Medications:  .  acetaminophen (TYLENOL) tablet 650 mg, 650 mg, Oral, Q8H PRN, Johnson, Clanford L, MD .  aspirin EC tablet 81 mg, 81 mg, Oral, Daily, Darrick Meigs, Marge Duncans, MD, 81 mg at 07/23/19 1054 .  calcitRIOL (ROCALTROL) capsule 0.5 mcg, 0.5 mcg, Oral, Daily, Pearson Grippe B, MD, 0.5 mcg at 07/23/19 1053 .  calcium carbonate (OS-CAL - dosed in mg of elemental calcium) tablet 1,000 mg of elemental calcium, 1,000 mg of elemental calcium, Oral, TID BM, Sanford, Ryan B, MD, 1,000 mg of elemental calcium at 07/23/19 1604 .  calcium carbonate (TUMS - dosed in mg elemental calcium) chewable tablet 200 mg of elemental calcium, 1 tablet, Oral, TID WC, Johnson, Clanford L, MD, 200 mg of elemental calcium at 07/23/19 1717 .  carvedilol (COREG) tablet 25 mg, 25 mg, Oral, BID WC, Darrick Meigs, Marge Duncans, MD, 25 mg at 07/23/19 1717 .  Chlorhexidine Gluconate Cloth 2 % PADS 6 each, 6 each, Topical, Daily, Johnson, Clanford L, MD, 6 each at 07/23/19 1055 .  cholecalciferol (VITAMIN D3) tablet 2,000 Units, 2,000 Units, Oral, Daily, Pearson Grippe B, MD, 2,000 Units at 07/23/19 1054 .  ferumoxytol (FERAHEME) 510 mg in sodium chloride 0.9 % 100 mL IVPB, 510 mg, Intravenous, Weekly, Pearson Grippe B, MD, Last Rate: 468 mL/hr at 07/22/19 1423, 510 mg at 07/22/19 1423 .  heparin injection 5,000 Units, 5,000 Units, Subcutaneous, Q8H, Johnson, Clanford L, MD, 5,000 Units at 07/23/19 1449 .  labetalol (NORMODYNE) injection 10 mg, 10 mg, Intravenous, Q2H PRN, Johnson, Clanford L, MD, 10 mg at 07/22/19 2150 .  lactated ringers infusion, , Intravenous, Continuous, Johnson, Clanford L, MD, Last Rate: 75 mL/hr at 07/23/19 0245, New Bag at 07/23/19 0245 .  levETIRAcetam (KEPPRA XR) 24 hr tablet 500  mg, 500 mg, Oral, Daily, Darrick Meigs, Marge Duncans, MD, 500 mg at 07/23/19 1055 .  oxyCODONE (Oxy IR/ROXICODONE) immediate release tablet 2.5-5 mg, 2.5-5 mg, Oral, Q6H PRN, Wynetta Emery, Clanford L, MD, 5 mg at 07/21/19 2337 .  sodium bicarbonate tablet 650 mg, 650 mg, Oral, BID, Darrick Meigs, Marge Duncans, MD, 650 mg at 07/23/19 1055 .  valACYclovir (VALTREX) tablet 500 mg, 500 mg, Oral, Daily, Darrick Meigs, Marge Duncans, MD, 500 mg at 07/23/19 1054 .  Vitamin D (Ergocalciferol) (DRISDOL) capsule 50,000 Units, 50,000 Units, Oral, Q7 days, Wynetta Emery, Clanford L, MD, 50,000 Units at 07/21/19 1723     ANALYSIS: A 16 channel recording using standard 10 20 measurements is conducted for 29 minutes.  The background activity gets as high as 11 to 12 Hz.  There is beta activity observed in frontal areas.  There is increased amount of myogenic interference noted throughout the recording.  There are episodes of generalized delta slowing especially in the frontocentral region.  There are evidence suggestive of stage II REM sleep occurring sporadically with nonsustained K complexes and spindles.  Photic stimulation and hyperventilation are not conducted.  There is no focal or lateralized slowing.  There is no epileptiform activities noted.   IMPRESSION: 1.  Recording shows episodes of generalized delta slowing typically seen in toxic metabolic processes.  Otherwise the recording is unrevealing.  No epileptiform discharges are noted.      Jauan Wohl A. Merlene Vincent, M.D.  Diplomate, Tax adviser of Psychiatry and Neurology (  Neurology).

## 2019-07-23 NOTE — Progress Notes (Signed)
Critical calcium 5.9 up from 5.4. Notified Dr. Wynetta Emery to make aware.

## 2019-07-23 NOTE — Progress Notes (Signed)
[place] A. Merlene Laughter, MD     [email or web address]          [name] is an 54 y.o. female.   ASSESSMENT/PLAN: 1. ACUTE ENCEPHALOPATHY LIKELY DUE TO METABOLIC DISTURBANCE.  This appears to have resolved at this time.  I will sign off from the case for now.  2. Epilepsy at baseline; continue with the same dose of Keppra.  3. Likely incidental myelopathy on exam.    She is much improved today and is probably at baseline cognitively.   GENERAL:  This is an overweight lady who is laying in bed with eyes closed. She is in no acute distress at this time.  HEENT:  Neck is supple no trauma appreciated.  ABDOMEN: soft  EXTREMITIES: No edema   BACK: Normal  SKIN: Normal by inspection.    MENTAL STATUS: She is absent in bed.  She is lucid and coherent.  She follows commands briskly.  No dysarthria is noted.  CRANIAL NERVES: Pupils are equal, round and reactive to light and accomodation; extra ocular movements are full, there is no significant nystagmus; visual fields are full; upper and lower facial muscles are normal in strength and symmetric, there is no flattening of the nasolabial folds; tongue is midline; uvula is midline; shoulder elevation is normal.  MOTOR: Normal tone, bulk and strength; no pronator drift.  COORDINATION: Left finger to nose is normal, right finger to nose is normal, No rest tremor; no intention tremor; no postural tremor; no bradykinesia.  SENSATION: Normal to light touch, temperature, and pain.    EEG Recording shows episodes of generalized delta slowing typically seen in toxic metabolic processes.  Otherwise the recording is unrevealing.  No epileptiform discharges are noted.    Blood pressure (!) 147/90, pulse 90, temperature 98.7 F (37.1 C), temperature source Oral, resp. rate 20, height 4\' 11"  (1.499 m), weight 78.2 kg, SpO2 100 %.  Past Medical History:  Diagnosis Date  . Anemia   . Anxiety   . Bipolar disorder  ([place])   . Depression   . History of blood transfusion   . Hypertension   . IUD    HISTORY OF IUD --REMOVED IN 2006  . Seizures ([place]) [id number]   two seizures due to a med changes    Past Surgical History:  Procedure Laterality Date  . CARPAL TUNNEL RELEASE Right 07/08/2014   Procedure: RIGHT CARPAL TUNNEL RELEASE;  Surgeon: Sanjuana Kava, MD;  Location: [facility];  Service: Orthopedics;  Laterality: Right;  . CHOLECYSTECTOMY    . GASTRIC BYPASS  2000  . HYSTEROSCOPY WITH D & C  11/14/2011   Procedure: DILATATION AND CURETTAGE /HYSTEROSCOPY;  Surgeon: Marylynn Pearson, MD;  Location: [place] [facility];  Service: Gynecology;;  with removal of polyps  . INTRAUTERINE DEVICE (IUD) INSERTION  03/21/2010  . LAPAROSCOPIC GASTROTOMY W/ REPAIR OF ULCER    . PANNICULECTOMY    . ROTATOR CUFF REPAIR      Family History  Problem Relation Age of Onset  . Hypertension Mother   . Kidney failure Mother   . Hypertension Father   . Diabetes Cousin   . Diabetes Sister   . Hypertension Sister   . Kidney disease Sister        dialysis  . Hypertension Brother   . Heart attack Brother   . Hypertension Maternal Aunt   . Hypertension Maternal Uncle   . Hypertension Maternal Grandmother   . Hypertension Maternal Grandfather   . Hypertension Paternal Grandmother   .  Hypertension Paternal Grandfather   . Heart attack Brother   . Hypertension Brother   . Hypertension Sister     Social History:  reports that she quit smoking about 31 years ago. Her smoking use included cigarettes. She has a 1.00 pack-year smoking history. She has never used smokeless tobacco. She reports current alcohol use. She reports that she does not use drugs.  Allergies:  Allergies  Allergen Reactions  . Ambien [Zolpidem Tartrate]     "BLACK OUT", SLEEP DRIVING  . Geodon [Ziprasidone Hcl]     Seizure activity  . Lactulose Itching  . Ondansetron Nausea And Vomiting  . Quetiapine     Other reaction(s): Other (See Comments) Possible Hair  Loss  . Sulfa Antibiotics Rash  . Zolpidem Rash    Medications: Prior to Admission medications   Medication Sig Start Date End Date Taking? Authorizing Provider  acetaminophen (TYLENOL 8 HOUR) 650 MG CR tablet Take 650 mg by mouth every 8 (eight) hours as needed for pain.   Yes [provider]  aspirin EC 81 MG tablet Take 81 mg by mouth daily.   Yes [provider]  calcitRIOL (ROCALTROL) 0.5 MCG capsule Take 0.5 mcg by mouth daily. 06/25/19  Yes [provider]  carvedilol (COREG) 25 MG tablet Take 25 mg by mouth 2 (two) times daily with a meal.   Yes [provider]  furosemide (LASIX) 80 MG tablet Take 1 tablet (80 mg total) by mouth daily. 09/12/18  Yes Tat, Shanon Brow, MD  hydrOXYzine (ATARAX/VISTARIL) 25 MG tablet Take 25 mg by mouth 2 (two) times daily. 05/30/19  Yes [provider]  levETIRAcetam (KEPPRA XR) 500 MG 24 hr tablet Take 1 tablet (500 mg total) by mouth daily. 05/30/19  Yes Cameron Sprang, MD  promethazine (PHENERGAN) 25 MG tablet Take 25 mg by mouth every 6 (six) hours as needed. 01/01/19  Yes [provider]  tiZANidine (ZANAFLEX) 4 MG tablet TAKE 1 TABLET BY MOUTH EVERY 12 HOURS AS NEEDED FOR SPASM Patient taking differently: Take 4 mg by mouth 2 (two) times daily as needed. TAKE 1 TABLET BY MOUTH EVERY 12 HOURS AS NEEDED FOR SPASM 05/10/19  Yes Sanjuana Kava, MD  traMADol (ULTRAM) 50 MG tablet Take 1 tablet (50 mg total) by mouth every 6 (six) hours as needed. 05/02/19  Yes Sanjuana Kava, MD  valACYclovir (VALTREX) 1000 MG tablet TAKE 1/2 TABLET BY MOUTH DAILY Patient taking differently: Take 500 mg by mouth daily.  10/22/18  Yes Donnamae Jude, MD  venlafaxine XR (EFFEXOR-XR) 150 MG 24 hr capsule Take 150 mg by mouth daily with breakfast.   Yes [provider]  vitamin B-12 (CYANOCOBALAMIN) 1000 MCG tablet Take 1 tablet by mouth daily. 10/01/18  Yes [provider]  Ferrous Fumarate (HEMOCYTE - 106 MG FE) 324  (106 Fe) MG TABS tablet Take 1 tablet by mouth daily. Takes 1-2 times daily as tolerated 03/29/18   [provider]    Scheduled Meds: . aspirin EC  81 mg Oral Daily  . calcitRIOL  0.5 mcg Oral Daily  . calcium carbonate  1,000 mg of elemental calcium Oral TID BM  . calcium carbonate  1 tablet Oral TID WC  . carvedilol  25 mg Oral BID WC  . Chlorhexidine Gluconate Cloth  6 each Topical Daily  . cholecalciferol  2,000 Units Oral Daily  . heparin injection (subcutaneous)  5,000 Units Subcutaneous Q8H  . levETIRAcetam  500 mg Oral Daily  .  sodium bicarbonate  650 mg Oral BID  . valACYclovir  500 mg Oral Daily  . Vitamin D (Ergocalciferol)  50,000 Units Oral Q7 days   Continuous Infusions: . ferumoxytol 510 mg (07/22/19 1423)  . lactated ringers 75 mL/hr at 07/23/19 0245   PRN Meds:.acetaminophen, labetalol, oxyCODONE     Results for orders placed or performed during the hospital encounter of 07/19/19 (from the past 48 hour(s))  Renal function panel     Status: Abnormal   Collection Time: 07/22/19  5:14 AM  Result Value Ref Range   Sodium 141 135 - 145 mmol/L   Potassium 3.5 3.5 - 5.1 mmol/L   Chloride 111 98 - 111 mmol/L   CO2 18 (L) 22 - 32 mmol/L   Glucose, Bld 83 70 - 99 mg/dL    Comment: Glucose reference range applies only to samples taken after fasting for at least 8 hours.   BUN 76 (H) 6 - 20 mg/dL   Creatinine, Ser 5.58 (H) 0.44 - 1.00 mg/dL   Calcium 5.4 (LL) 8.9 - 10.3 mg/dL    Comment: CRITICAL RESULT CALLED TO, READ BACK BY AND VERIFIED WITH: BONDURANT,R AT 6:00AM ON 07/22/19 BY FESTERMAN,C    Phosphorus 5.5 (H) 2.5 - 4.6 mg/dL   Albumin 2.9 (L) 3.5 - 5.0 g/dL   GFR calc non Af Amer 8 (L) >60 mL/min   GFR calc Af Amer 9 (L) >60 mL/min   Anion gap 12 5 - 15    Comment: Performed at [facility], [address] [place], [place] [address]  CK     Status: Abnormal   Collection Time: 07/22/19  5:15 AM  Result Value Ref Range   Total CK 1,008 (H) 38 - 234  U/L    Comment: Performed at [facility], [address] [place], [place] [address]  CBC with Differential/Platelet     Status: Abnormal   Collection Time: 07/22/19  5:15 AM  Result Value Ref Range   WBC 4.3 4.0 - 10.5 K/uL   RBC 2.64 (L) 3.87 - 5.11 MIL/uL   Hemoglobin 7.6 (L) 12.0 - 15.0 g/dL   HCT 25.4 (L) 36.0 - 46.0 %   MCV 96.2 80.0 - 100.0 fL   MCH 28.8 26.0 - 34.0 pg   MCHC 29.9 (L) 30.0 - 36.0 g/dL   RDW 14.2 11.5 - 15.5 %   Platelets 145 (L) 150 - 400 K/uL   nRBC 0.0 0.0 - 0.2 %   Neutrophils Relative % 57 %   Neutro Abs 2.5 1.7 - 7.7 K/uL   Lymphocytes Relative 28 %   Lymphs Abs 1.2 0.7 - 4.0 K/uL   Monocytes Relative 13 %   Monocytes Absolute 0.6 0.1 - 1.0 K/uL   Eosinophils Relative 0 %   Eosinophils Absolute 0.0 0.0 - 0.5 K/uL   Basophils Relative 1 %   Basophils Absolute 0.0 0.0 - 0.1 K/uL   Immature Granulocytes 1 %   Abs Immature Granulocytes 0.02 0.00 - 0.07 K/uL    Comment: Performed at [facility], [address] [place], [place] [address]  Magnesium     Status: Abnormal   Collection Time: 07/22/19  5:15 AM  Result Value Ref Range   Magnesium 1.4 (L) 1.7 - 2.4 mg/dL    Comment: Performed at [facility], [address], [place], [place] [address]  Vitamin B12     Status: Abnormal   Collection Time: 07/22/19 11:27 AM  Result Value Ref Range   Vitamin B-12  957 (H) 180 - 914 pg/mL    Comment: (NOTE) This assay is not validated for testing neonatal or myeloproliferative syndrome specimens for Vitamin B12 levels. Performed at [facility], [address], [place], [place] [address]   Renal function panel     Status: Abnormal   Collection Time: 07/23/19  5:52 AM  Result Value Ref Range   Sodium 140 135 - 145 mmol/L   Potassium 3.6 3.5 - 5.1 mmol/L   Chloride 108 98 - 111 mmol/L   CO2 19 (L) 22 - 32 mmol/L   Glucose, Bld 99 70 - 99 mg/dL    Comment: Glucose reference range applies only to samples taken after fasting for at least 8 hours.   BUN 69 (H) 6  - 20 mg/dL   Creatinine, Ser 5.16 (H) 0.44 - 1.00 mg/dL   Calcium 5.9 (LL) 8.9 - 10.3 mg/dL    Comment: CRITICAL RESULT CALLED TO, READ BACK BY AND VERIFIED WITH: GRAVES,K AT 7:00AM ON 07/23/19 BY [facility]    Phosphorus 4.6 2.5 - 4.6 mg/dL   Albumin 3.0 (L) 3.5 - 5.0 g/dL   GFR calc non Af Amer 9 (L) >60 mL/min   GFR calc Af Amer 10 (L) >60 mL/min   Anion gap 13 5 - 15    Comment: Performed at [facility], [address], [place], [place] [address]  Magnesium     Status: None   Collection Time: 07/23/19  5:52 AM  Result Value Ref Range   Magnesium 1.8 1.7 - 2.4 mg/dL    Comment: Performed at [facility], [address], [place],  [address]    Studies/Results:   HEAD CT FINDINGS: Brain: No evidence of acute infarction, hemorrhage, hydrocephalus, extra-axial collection or mass lesion/mass effect. Stable extensive white matter hypoattenuation consistent with advanced small vessel disease.  Vascular: No hyperdense vessel or unexpected calcification.  Skull: Normal. Negative for fracture or focal lesion.  Sinuses/Orbits: No acute finding.  Other: None.  IMPRESSION: No acute intracranial pathology.   The head CT scan is reviewed in person issues no hypodensity, fluid collections or hyperdensity.       Shacoya Burkhammer A. Merlene Laughter, M.D.  Diplomate, Tax adviser of Psychiatry and Neurology ( Neurology). 07/23/2019, 6:08 PM

## 2019-07-23 NOTE — Progress Notes (Signed)
Admit: 07/19/2019 LOS: 3  57F AoCKD4, AMS, inc CK 1900, hypocalcemia  Subjective:  Marland Kitchen More awake this AM . Meeting with Palliative Med . She confirms that she would refuse HD if needed . SCr cont to improve, thankfully . Ca up to 5.9, Alb 3.0. Marland Kitchen She denies recent Prolia therapy . Good UOP . On LR @100 /h  05/03 0701 - 05/04 0700 In: 3786.8 [P.O.:240; I.V.:3330.6; IV Piggyback:216.2] Out: 1700 [Urine:1700]  Filed Weights   07/19/19 1849 07/22/19 0719 07/23/19 0524  Weight: 77.1 kg 82.5 kg 78.2 kg    Scheduled Meds: . aspirin EC  81 mg Oral Daily  . calcitRIOL  0.5 mcg Oral Daily  . calcium carbonate  1,000 mg of elemental calcium Oral TID BM  . calcium carbonate  1 tablet Oral TID WC  . carvedilol  25 mg Oral BID WC  . Chlorhexidine Gluconate Cloth  6 each Topical Daily  . cholecalciferol  2,000 Units Oral Daily  . heparin injection (subcutaneous)  5,000 Units Subcutaneous Q8H  . levETIRAcetam  500 mg Oral Daily  . sodium bicarbonate  650 mg Oral BID  . valACYclovir  500 mg Oral Daily  . Vitamin D (Ergocalciferol)  50,000 Units Oral Q7 days   Continuous Infusions: . ferumoxytol 510 mg (07/22/19 1423)  . lactated ringers 75 mL/hr at 07/23/19 0245   PRN Meds:.acetaminophen, labetalol, oxyCODONE  Current Labs: reviewed  Results for LATORYA, BAUTCH (MRN [id number]) as of 07/22/2019 09:31  Ref. Range 07/21/2019 06:13  Saturation Ratios Latest Ref Range: 10.4 - 31.8 % 14  Ferritin Latest Ref Range: 11 - 307 ng/mL 15    Physical Exam:  Blood pressure (!) 147/90, pulse 90, temperature 98.7 F (37.1 C), temperature source Oral, resp. rate 20, height 4' 11"  (1.499 m), weight 78.2 kg, SpO2 100 %. NAD, elderly female Awake, alert, interactive RRR  CTAB No sig LEE Nonfocal  A 1. AoCKD4: mild inc in CK possibly contributing.  Nonoliguric.  Resolving.  No HD indicated; partially volume responsive suggesting hypovolemia at presentation. Pt confirms she would choose not to rec  HD if indicated 2. Hypocalcemia: PTH 722, 25 Vit D = 17; slowly improving, not sure this is just 2HPTH 3. Inc CK, mild  Rhabdo; likely 4. Anemia; downtrending with hydration;  5. AG Met acidosis; stable, mild, CTM 6. AMS, seems improving   P . Stop IVF . Cont Ca Carbonate (not meal time dosing) for #2 . Cont C3 to 0.38mg/d . Cont 2000 Cholecalciferol / d . 2gm Cal Gluc now . S/p Fereheme 5/3; might req ESA Support also . Medication Issues; o Preferred narcotic agents for pain control are hydromorphone, fentanyl, and methadone. Morphine should not be used.  o Baclofen should be avoided o Avoid oral sodium phosphate and magnesium citrate based laxatives / bowel preps    RPearson GrippeMD 07/23/2019, 12:10 PM  Recent Labs  Lab 07/21/19 0613 07/22/19 0514 07/23/19 0552  NA 140 141 140  K 3.5 3.5 3.6  CL 110 111 108  CO2 19* 18* 19*  GLUCOSE 82 83 99  BUN 85* 76* 69*  CREATININE 5.99* 5.58* 5.16*  CALCIUM 5.2* 5.4* 5.9*  PHOS 5.6* 5.5* 4.6   Recent Labs  Lab 07/19/19 2015 07/19/19 2015 07/20/19 0630 07/21/19 0613 07/22/19 0515  WBC 5.8   < > 5.4 4.6 4.3  NEUTROABS 4.2  --   --  2.9 2.5  HGB 8.5*   < > 8.1* 7.8* 7.6*  HCT 27.5*   < >  25.8* 25.1* 25.4*  MCV 95.2   < > 94.5 96.2 96.2  PLT 164   < > 160 163 145*   < > = values in this interval not displayed.

## 2019-07-23 NOTE — Progress Notes (Signed)
Foley catheter removed

## 2019-07-23 NOTE — Consult Note (Signed)
Consultation Note Date: 07/23/2019   Patient Name: [name]  DOB: [birth year]  MRN: [id number]  Age / Sex: 54 y.o., female  PCP: Pixie Casino, MD Referring Physician: Murlean Iba, MD  Reason for Consultation: Establishing goals of care  HPI/Patient Profile: 54 y.o. female  with past medical history of CKD stage IV, bipolar disorder, seizures, HTN, anemia, gastric bypass admitted on 07/19/2019 with altered mental status. Found to have severe hypocalcemia and AKI (creatinine 7.21) with elevated CPK level. Nephrology following. Kidney function trending down with good urine output. No indication for hemodialysis. Managing electrolytes. Neurology consulted and recommending EEG which is pending. Palliative medicine consultation by request of patient/goals of care.   Clinical Assessment and Goals of Care:  I have reviewed medical records, discussed with care team, and met with patient at bedside to discuss goals of care. Patient is awake, alert, oriented and able to participate in goals of care discussion. In good spirits this morning.   I introduced Palliative Medicine as specialized medical care for people living with serious illness. It focuses on providing relief from the symptoms and stress of a serious illness. The goal is to improve quality of life for both the patient and the family.  Right after palliative introduction, patient tells me clearly that she does not ever want hemodialysis if this was necessary. She shares this is because of her experiences with multiple family members on dialysis including her father and brother (deceased now) and currently her sister and a cousin.   Discussed in detail events leading up to admission and course of hospitalization including diagnoses, interventions, plan of care and recommendations from specialists. Explained that luckily, her kidney function is  improving and hemodialysis is not indicated but further explained irreversible nature of her CKD and she may be faced with this decision again at some point. Explained the importance of discussing her wishes against dialysis with trusting family members now while she is of sound mind. Also explained that if dialysis was needed (and if she did not want) we respect this decision but importance of her understanding that life expectancy would be short if/when her kidneys shut down.   [name] lives alone in an apartment in [place]. Her biggest support system includes two sisters and her nephew. She shares that her sisters know she would not wish for dialysis if it came to that. Encouraged ongoing compliance and f/u with Dr. Hollie Salk outpatient. [name] shares that she has great rapport with Dr. Hollie Salk.   Dr. Joelyn Oms at bedside during discussion. Patient again emphasizes she does NOT want dialysis, now or ever. Dr. Joelyn Oms provided update and also encouraged ongoing discussions with Dr. Hollie Salk outpatient.   Therapeutic listening as patient shares many stories about her family.  Advanced directives, concepts specific to code status, artifical feeding and hydration were discussed. AD packet and MOST form reviewed in detail. Explained that patient is full code status in the computer, which she is surprised to hear of this. Patient would like to further discuss AD packet  and MOST form with PMT provider but lunch arrives and she wishes to focus on her lunch. She is considering limitations to care. Patient requesting time to review documents and further discuss with PMT provider tomorrow morning.   Questions and concerns were addressed.  Hard Choices booklet left for review. PMT contact information given.     SUMMARY OF RECOMMENDATIONS    Continue current plan of care and medical management. Kidney function slowly improving. Dialysis is not indicated.  Patient absolutely does NOT want dialysis if ever indicated. She  tells PMT provider this multiple times during visit. Discussed importance of early discussions with family about her wishes while she is of sound mind. Also encouraged ongoing compliance and discussions outpatient with Dr. Hollie Salk.  AD packet and MOST form reviewed. Patient requests time to review but interested in completing with PMT provider tomorrow, 5/5. She is considering limitations to care.  May benefit from outpatient palliative referral.   Continue outpatient nephrology follow-up.   Code Status/Advance Care Planning:  Full code  Symptom Management:   Per attending  Palliative Prophylaxis:   Bowel Regimen and Frequent Pain Assessment  Psycho-social/Spiritual:   Desire for further Chaplaincy support: yes  Additional Recommendations: Caregiving  Support/Resources  Prognosis:   Unable to determine  Discharge Planning: Home with Home Health      Primary Diagnoses: Present on Admission: . Acute renal failure superimposed on stage 4 chronic kidney disease ([place]) . Bipolar disorder ([place]) . Chronic kidney disease, stage IV (severe) ([place]) . Herpes simplex vulvovaginitis . Hypocalcemia . Vitamin D deficiency . Vitamin B12 deficiency (dietary) anemia . B12 deficiency   I have reviewed the medical record, interviewed the patient and family, and examined the patient. The following aspects are pertinent.  Past Medical History:  Diagnosis Date  . Anemia   . Anxiety   . Bipolar disorder ([place])   . Depression   . History of blood transfusion   . Hypertension   . IUD    HISTORY OF IUD --REMOVED IN 2006  . Seizures ([place]) [id number]   two seizures due to a med changes   Social History   Socioeconomic History  . Marital status: Single    Spouse name: Not on file  . Number of children: 0  . Years of education: 81  . Highest education level: Not on file  Occupational History  . Occupation: Retired  Tobacco Use  . Smoking status: Former Smoker    Packs/day: 0.25     Years: 4.00    Pack years: 1.00    Types: Cigarettes    Quit date: 03/21/1988    Years since quitting: 31.3  . Smokeless tobacco: Never Used  Substance and Sexual Activity  . Alcohol use: Yes    Comment: 1-2 times a year.   . Drug use: No  . Sexual activity: Not Currently    Birth control/protection: I.U.D.  Other Topics Concern  . Not on file  Social History Narrative   Fun: Puzzles, watch mystery shows, read   Denies religious beliefs effecting health care.       Pt lives in 2 story home with her sister-in-law and niece   Some college education   Retired Scientist, research (medical)    Social Determinants of Radio broadcast assistant Strain:   . Difficulty of Paying Living Expenses:   Food Insecurity:   . Worried About Charity fundraiser in the Last Year:   . [place] in the Last Year:  Transportation Needs:   . Film/video editor (Medical):   Marland Kitchen Lack of Transportation (Non-Medical):   Physical Activity:   . Days of Exercise per Week:   . Minutes of Exercise per Session:   Stress:   . Feeling of Stress :   Social Connections:   . Frequency of Communication with Friends and Family:   . Frequency of Social Gatherings with Friends and Family:   . Attends Religious Services:   . Active Member of Clubs or Organizations:   . Attends Archivist Meetings:   Marland Kitchen Marital Status:    Family History  Problem Relation Age of Onset  . Hypertension Mother   . Kidney failure Mother   . Hypertension Father   . Diabetes Cousin   . Diabetes Sister   . Hypertension Sister   . Kidney disease Sister        dialysis  . Hypertension Brother   . Heart attack Brother   . Hypertension Maternal Aunt   . Hypertension Maternal Uncle   . Hypertension Maternal Grandmother   . Hypertension Maternal Grandfather   . Hypertension Paternal Grandmother   . Hypertension Paternal Grandfather   . Heart attack Brother   . Hypertension Brother   . Hypertension Sister    Scheduled  Meds: . aspirin EC  81 mg Oral Daily  . calcitRIOL  0.5 mcg Oral Daily  . calcium carbonate  1,000 mg of elemental calcium Oral TID BM  . calcium carbonate  1 tablet Oral TID WC  . carvedilol  25 mg Oral BID WC  . Chlorhexidine Gluconate Cloth  6 each Topical Daily  . cholecalciferol  2,000 Units Oral Daily  . heparin injection (subcutaneous)  5,000 Units Subcutaneous Q8H  . levETIRAcetam  500 mg Oral Daily  . sodium bicarbonate  650 mg Oral BID  . valACYclovir  500 mg Oral Daily  . Vitamin D (Ergocalciferol)  50,000 Units Oral Q7 days   Continuous Infusions: . calcium gluconate 1,000 mg (07/23/19 1447)  . ferumoxytol 510 mg (07/22/19 1423)  . lactated ringers 75 mL/hr at 07/23/19 0245   PRN Meds:.acetaminophen, labetalol, oxyCODONE Medications Prior to Admission:  Prior to Admission medications   Medication Sig Start Date End Date Taking? Authorizing Provider  acetaminophen (TYLENOL 8 HOUR) 650 MG CR tablet Take 650 mg by mouth every 8 (eight) hours as needed for pain.   Yes [provider]  aspirin EC 81 MG tablet Take 81 mg by mouth daily.   Yes [provider]  calcitRIOL (ROCALTROL) 0.5 MCG capsule Take 0.5 mcg by mouth daily. 06/25/19  Yes [provider]  carvedilol (COREG) 25 MG tablet Take 25 mg by mouth 2 (two) times daily with a meal.   Yes [provider]  furosemide (LASIX) 80 MG tablet Take 1 tablet (80 mg total) by mouth daily. 09/12/18  Yes Tat, Shanon Brow, MD  hydrOXYzine (ATARAX/VISTARIL) 25 MG tablet Take 25 mg by mouth 2 (two) times daily. 05/30/19  Yes [provider]  levETIRAcetam (KEPPRA XR) 500 MG 24 hr tablet Take 1 tablet (500 mg total) by mouth daily. 05/30/19  Yes Cameron Sprang, MD  promethazine (PHENERGAN) 25 MG tablet Take 25 mg by mouth every 6 (six) hours as needed. 01/01/19  Yes [provider]  tiZANidine (ZANAFLEX) 4 MG tablet TAKE 1 TABLET BY MOUTH EVERY 12 HOURS AS NEEDED FOR SPASM Patient taking  differently: Take 4 mg by mouth 2 (two) times daily as needed. TAKE 1  TABLET BY MOUTH EVERY 12 HOURS AS NEEDED FOR SPASM 05/10/19  Yes Sanjuana Kava, MD  traMADol (ULTRAM) 50 MG tablet Take 1 tablet (50 mg total) by mouth every 6 (six) hours as needed. 05/02/19  Yes Sanjuana Kava, MD  valACYclovir (VALTREX) 1000 MG tablet TAKE 1/2 TABLET BY MOUTH DAILY Patient taking differently: Take 500 mg by mouth daily.  10/22/18  Yes Donnamae Jude, MD  venlafaxine XR (EFFEXOR-XR) 150 MG 24 hr capsule Take 150 mg by mouth daily with breakfast.   Yes [provider]  vitamin B-12 (CYANOCOBALAMIN) 1000 MCG tablet Take 1 tablet by mouth daily. 10/01/18  Yes [provider]  Ferrous Fumarate (HEMOCYTE - 106 MG FE) 324 (106 Fe) MG TABS tablet Take 1 tablet by mouth daily. Takes 1-2 times daily as tolerated 03/29/18   [provider]   Allergies  Allergen Reactions  . Ambien [Zolpidem Tartrate]     "BLACK OUT", SLEEP DRIVING  . Geodon [Ziprasidone Hcl]     Seizure activity  . Lactulose Itching  . Ondansetron Nausea And Vomiting  . Quetiapine     Other reaction(s): Other (See Comments) Possible Hair Loss  . Sulfa Antibiotics Rash  . Zolpidem Rash   Review of Systems  All other systems reviewed and are negative.   Physical Exam Vitals and nursing note reviewed.  Constitutional:      General: She is awake.  HENT:     Head: Normocephalic and atraumatic.  Pulmonary:     Effort: No tachypnea, accessory muscle usage or respiratory distress.  Skin:    General: Skin is warm and dry.  Neurological:     Mental Status: She is alert and oriented to person, place, and time.  Psychiatric:        Attention and Perception: Attention normal.        Mood and Affect: Mood normal.        Speech: Speech normal.        Behavior: Behavior is hyperactive.        Cognition and Memory: Cognition normal.    Vital Signs: BP (!) 147/90 (BP Location: Right Arm)   Pulse 90   Temp 98.7 F (37.1  C) (Oral)   Resp 20   Ht 4' 11"  (1.499 m)   Wt 78.2 kg   SpO2 100%   BMI 34.82 kg/m  Pain Scale: 0-10   Pain Score: 0-No pain   SpO2: SpO2: 100 % O2 Device:SpO2: 100 % O2 Flow Rate: .   IO: Intake/output summary:   Intake/Output Summary (Last 24 hours) at 07/23/2019 1450 Last data filed at 07/23/2019 0524 Gross per 24 hour  Intake 3546.82 ml  Output 1700 ml  Net 1846.82 ml    LBM: Last BM Date: 07/23/19 Baseline Weight: Weight: 77.1 kg Most recent weight: Weight: 78.2 kg     Palliative Assessment/Data: PPS 60%   Flowsheet Rows     Most Recent Value  Intake Tab  Referral Department  Hospitalist  Unit at Time of Referral  Med/Surg Unit  Palliative Care Primary Diagnosis  Nephrology  Palliative Care Type  New Palliative care  Reason for referral  Clarify Goals of Care  Date first seen by Palliative Care  07/23/19  Clinical Assessment  Palliative Performance Scale Score  60%  Psychosocial & Spiritual Assessment  Palliative Care Outcomes  Patient/Family meeting held?  Yes  Who was at the meeting?  patient  Palliative Care Outcomes  Clarified goals of care, Provided end of life  care assistance, Provided psychosocial or spiritual support, ACP counseling assistance, Linked to palliative care logitudinal support     Time In: 1050 Time Out: 1200 Time Total: 70 Greater than 50%  of this time was spent counseling and coordinating care related to the above assessment and plan.  Signed by:  Ihor Dow, DNP, FNP-C Palliative Medicine Team  Phone: [phone] Fax: [phone]   Please contact Palliative Medicine Team phone at [phone] for questions and concerns.  For individual provider: See Shea Evans

## 2019-07-24 DIAGNOSIS — Z66 Do not resuscitate: Secondary | ICD-10-CM

## 2019-07-24 LAB — CBC
HCT: 24.9 % — ABNORMAL LOW (ref 36.0–46.0)
Hemoglobin: 7.7 g/dL — ABNORMAL LOW (ref 12.0–15.0)
MCH: 29.8 pg (ref 26.0–34.0)
MCHC: 30.9 g/dL (ref 30.0–36.0)
MCV: 96.5 fL (ref 80.0–100.0)
Platelets: 157 10*3/uL (ref 150–400)
RBC: 2.58 MIL/uL — ABNORMAL LOW (ref 3.87–5.11)
RDW: 14.3 % (ref 11.5–15.5)
WBC: 3.8 10*3/uL — ABNORMAL LOW (ref 4.0–10.5)
nRBC: 0 % (ref 0.0–0.2)

## 2019-07-24 LAB — RENAL FUNCTION PANEL
Albumin: 2.9 g/dL — ABNORMAL LOW (ref 3.5–5.0)
Anion gap: 7 (ref 5–15)
BUN: 60 mg/dL — ABNORMAL HIGH (ref 6–20)
CO2: 20 mmol/L — ABNORMAL LOW (ref 22–32)
Calcium: 6.4 mg/dL — CL (ref 8.9–10.3)
Chloride: 112 mmol/L — ABNORMAL HIGH (ref 98–111)
Creatinine, Ser: 4.97 mg/dL — ABNORMAL HIGH (ref 0.44–1.00)
GFR calc Af Amer: 11 mL/min — ABNORMAL LOW (ref >60)
GFR calc non Af Amer: 9 mL/min — ABNORMAL LOW (ref >60)
Glucose, Bld: 85 mg/dL (ref 70–99)
Phosphorus: 4.3 mg/dL (ref 2.5–4.6)
Potassium: 3.7 mmol/L (ref 3.5–5.1)
Sodium: 139 mmol/L (ref 135–145)

## 2019-07-24 LAB — MAGNESIUM: Magnesium: 1.7 mg/dL (ref 1.7–2.4)

## 2019-07-24 MED ORDER — MAGNESIUM SULFATE 2 GM/50ML IV SOLN
2.0000 g | Freq: Once | INTRAVENOUS | Status: AC
  Administered 2019-07-24: 21:00:00 2 g via INTRAVENOUS
  Filled 2019-07-24: qty 50

## 2019-07-24 MED ORDER — DARBEPOETIN ALFA 40 MCG/0.4ML IJ SOSY
40.0000 ug | PREFILLED_SYRINGE | Freq: Once | INTRAMUSCULAR | Status: AC
  Administered 2019-07-24: 40 ug via SUBCUTANEOUS
  Filled 2019-07-24: qty 0.4

## 2019-07-24 MED ORDER — CALCIUM GLUCONATE-NACL 1-0.675 GM/50ML-% IV SOLN
1.0000 g | Freq: Once | INTRAVENOUS | Status: AC
  Administered 2019-07-24: 1000 mg via INTRAVENOUS
  Filled 2019-07-24: qty 50

## 2019-07-24 NOTE — TOC Initial Note (Signed)
Transition of Care [facility]) - Initial/Assessment Note    Patient Details  Name: [name] MRN: [id number] Date of Birth: [birth year]  Transition of Care [facility]) CM/SW Contact:    Shade Flood, LCSW Phone Number: 07/24/2019, 1:37 PM  Clinical Narrative:                  Pt from home. Will need HH PT at dc. Spoke with pt who is agreeable. CMS provider options reviewed with pt. She does not have a provider preference. Referred to [facility]. Assigned TOC will follow and update Corey at [facility] when pt stable for dc.  Pt not anticipating she will have any other TOC needs for dc.  TOC will follow.  Expected Discharge Plan: [place] Barriers to Discharge: Continued Medical Work up   Patient Goals and CMS Choice     Choice offered to / list presented to : Patient  Expected Discharge Plan and Services Expected Discharge Plan: [place] In-house Referral: Clinical Social Work   Post Acute Care Choice: [place] arrangements for the past 2 months: [place]: PT [place]: [place] Date [place]: 07/24/19   Representative spoke with at [place]: Georgina Snell  Prior Living Arrangements/Services Living arrangements for the past 2 months: Single Family Home Lives with:: Self Patient language and need for interpreter reviewed:: Yes Do you feel safe going back to the place where you live?: Yes      Need for Family Participation in Patient Care: No (Comment) Care giver support system in place?: No (comment)   Criminal Activity/Legal Involvement Pertinent to Current Situation/Hospitalization: No - Comment as needed  Activities of Daily Living Home Assistive Devices/Equipment: None ADL Screening (condition at time of admission) Patient's cognitive ability adequate to safely complete daily activities?: Yes Is the patient deaf or have difficulty hearing?: No Does the  patient have difficulty seeing, even when wearing glasses/contacts?: No Does the patient have difficulty concentrating, remembering, or making decisions?: No Patient able to express need for assistance with ADLs?: Yes Does the patient have difficulty dressing or bathing?: No Independently performs ADLs?: Yes (appropriate for developmental age) Does the patient have difficulty walking or climbing stairs?: No Weakness of Legs: None Weakness of Arms/Hands: None  Permission Sought/Granted Permission sought to share information with : Chartered certified accountant granted to share information with : Yes, Verbal Permission Granted     Permission granted to share info w AGENCY: [place] agencies        Emotional Assessment   Attitude/Demeanor/Rapport: Engaged Affect (typically observed): Pleasant Orientation: : Oriented to Self, Oriented to Place, Oriented to  Time, Oriented to Situation Alcohol / Substance Use: Not Applicable Psych Involvement: No (comment)  Admission diagnosis:  Seizure ([place]) [R56.9] Acute renal failure superimposed on stage 4 chronic kidney disease ([place]) [N17.9, N18.4] Patient Active Problem List   Diagnosis Date Noted  . Palliative care by specialist   . Goals of care, counseling/discussion   . Hypocalcemia 07/21/2019  . Acute renal failure superimposed on stage 4 chronic kidney disease ([place]) 09/10/2018  . Hypertensive urgency, malignant   . Acute renal failure superimposed on chronic kidney disease ([place])   . Hypertensive emergency 09/08/2018  . Acute renal injury ([place]) 09/08/2018  . Alkaline phosphatase elevation 03/29/2018  .  B12 deficiency 03/29/2018  . Vitamin D deficiency 03/29/2018  . History of herpes genitalis 03/22/2018  . Obesity (BMI 30.0-34.9) 03/22/2018  . Osteoarthritis 03/22/2018  . Seizure ([place]) 07/06/2017  . Noncompliance with medication regimen 04/27/2017  . Vasovagal syncope 03/29/2017  . Rotator cuff arthropathy of left shoulder  07/07/2016  . S/P shoulder surgery 07/07/2016  . LGSIL on Pap smear of cervix 04/26/2016  . Herpes simplex vulvovaginitis 03/01/2016  . Lower extremity edema 07/20/2015  . Chronic kidney disease, stage IV (severe) ([place]) 05/14/2015  . Proteinuria 05/14/2015  . Generalized headaches 01/05/2015  . Weight gain following gastric bypass surgery 08/13/2014  . Deficiency anemia 01/06/2014  . Iron deficiency anemia 01/06/2014  . Vitamin B12 deficiency (dietary) anemia 01/06/2014  . Essential hypertension, benign 01/25/2013  . Bipolar disorder ([place])    PCP:  Pixie Casino, MD Pharmacy:   [place], [place] [address] AT [place] [address] Phone: [phone] Fax: [place], [place] [address] AT [place] [address] [place] [address] Phone: [phone] Fax: [phone]  [place], [place] [address] Phone: [phone] Fax: [phone] [place], [place] [address] Phone: [phone] Fax: [phone]     Social Determinants of Health (SDOH) Interventions    Readmission Risk Interventions No flowsheet data found.

## 2019-07-24 NOTE — Progress Notes (Signed)
Daily Progress Note   Patient Name: [name]       Date: 07/24/2019 DOB: [birth year]  Age: 54 y.o. MRN#: [id number] Attending Physician: Roxan Hockey, MD Primary Care Physician: Pixie Casino, MD Admit Date: 07/19/2019  Reason for Consultation/Follow-up: Establishing goals of care  Subjective: Patient awake, alert, oriented and able to participate in discussion. In good spirits this morning.   GOC:  F/u GOC with patient following conversations yesterday. Again reviewed diagnoses, interventions, plan of care.   Discussed AD packet at length. Patient wishes for her sister [name] to be primary HCPOA and nephew [name] to be secondary HCPOA. Patient reports she has discussed her wishes with family. Encouraged ongoing discussions. She plans to further review AD packet with her sister, gather addresses, and consider completing with notary soon.   Discussed MOST form at length. Patient is ready to complete MOST form today. Patient does not want 'people pounding on my chest' and shares she saw her brother experience cpr. Patient also shares how clear her deceased mother was on wishes against heroic measures at EOL. [name]'s wishes include DNR/DNI, limited additional interventions including rehospitalization, non-invasive airway support BIPAP/CPAP if indicated, IVF/ABX if indicated, NO dialysis, NO feeding tube. Electronic Vynca MOST completed. Copies of MOST form made for paper chart and three copies for patient and her family. Durable DNR completed.   Patient requesting code status change in EMR, understanding this does not change current plan of care and medical management.   Therapeutic listening as patient shares comical events that have occurred since she has been in the  hospital. She is staying positive and patient. Understands she may need to be in the hospital a few more days.   Emotional/spiritual support provided. Patient has PMT contact information. Answered all questions.   Length of Stay: 4  Current Medications: Scheduled Meds:  . aspirin EC  81 mg Oral Daily  . calcitRIOL  0.5 mcg Oral Daily  . calcium carbonate  1,000 mg of elemental calcium Oral TID BM  . calcium carbonate  1 tablet Oral TID WC  . carvedilol  25 mg Oral BID WC  . Chlorhexidine Gluconate Cloth  6 each Topical Daily  . heparin injection (subcutaneous)  5,000 Units Subcutaneous Q8H  . levETIRAcetam  500 mg Oral Daily  . sodium bicarbonate  650 mg Oral BID  . valACYclovir  500 mg Oral Daily  . Vitamin D (Ergocalciferol)  50,000 Units Oral Q7 days    Continuous Infusions: . ferumoxytol 510 mg (07/22/19 1423)  . lactated ringers 75 mL/hr at 07/23/19 0245    PRN Meds: acetaminophen, labetalol, oxyCODONE  Physical Exam Vitals and nursing note reviewed.  Constitutional:      General: She is awake.  Pulmonary:     Effort: No tachypnea, accessory muscle usage or respiratory distress.  Skin:    General: Skin is warm and dry.  Neurological:     Mental Status: She is alert and oriented to person, place, and time.  Psychiatric:        Mood and Affect: Mood normal.        Speech: Speech normal.        Behavior: Behavior normal.        Cognition and Memory: Cognition normal.            Vital Signs: BP (!) 169/101 (BP Location: Right Arm)   Pulse 85   Temp 98.6 F (37 C) (Oral)   Resp 16   Ht 4\' 11"  (1.499 m)   Wt 78.2 kg   SpO2 100%   BMI 34.82 kg/m  SpO2: SpO2: 100 % O2 Device: O2 Device: Room Air O2 Flow Rate:    Intake/output summary:   Intake/Output Summary (Last 24 hours) at 07/24/2019 1132 Last data filed at 07/24/2019 0900 Gross per 24 hour  Intake 1856.85 ml  Output --  Net 1856.85 ml   LBM: Last BM Date: 07/23/19 Baseline Weight: Weight: 77.1  kg Most recent weight: Weight: 78.2 kg       Palliative Assessment/Data: PPS 60%    Flowsheet Rows     Most Recent Value  Intake Tab  Referral Department  Hospitalist  Unit at Time of Referral  Med/Surg Unit  Palliative Care Primary Diagnosis  Nephrology  Palliative Care Type  New Palliative care  Reason for referral  Clarify Goals of Care  Date first seen by Palliative Care  07/23/19  Clinical Assessment  Palliative Performance Scale Score  60%  Psychosocial & Spiritual Assessment  Palliative Care Outcomes  Patient/Family meeting held?  Yes  Who was at the meeting?  patient  Palliative Care Outcomes  Clarified goals of care, Provided end of life care assistance, Provided psychosocial or spiritual support, ACP counseling assistance, Linked to palliative care logitudinal support      Patient Active Problem List   Diagnosis Date Noted  . Palliative care by specialist   . Goals of care, counseling/discussion   . Hypocalcemia 07/21/2019  . Acute renal failure superimposed on stage 4 chronic kidney disease ([place]) 09/10/2018  . Hypertensive urgency, malignant   . Acute renal failure superimposed on chronic kidney disease ([place])   . Hypertensive emergency 09/08/2018  . Acute renal injury ([place]) 09/08/2018  . Alkaline phosphatase elevation 03/29/2018  . B12 deficiency 03/29/2018  . Vitamin D deficiency 03/29/2018  . History of herpes genitalis 03/22/2018  . Obesity (BMI 30.0-34.9) 03/22/2018  . Osteoarthritis 03/22/2018  . Seizure ([place]) 07/06/2017  . Noncompliance with medication regimen 04/27/2017  . Vasovagal syncope 03/29/2017  . Rotator cuff arthropathy of left shoulder 07/07/2016  . S/P shoulder surgery 07/07/2016  . LGSIL on Pap smear of cervix 04/26/2016  . Herpes simplex vulvovaginitis 03/01/2016  . Lower extremity edema 07/20/2015  . Chronic kidney disease, stage IV (severe) ([place]) 05/14/2015  . Proteinuria 05/14/2015  . Generalized  headaches 01/05/2015  . Weight  gain following gastric bypass surgery 08/13/2014  . Deficiency anemia 01/06/2014  . Iron deficiency anemia 01/06/2014  . Vitamin B12 deficiency (dietary) anemia 01/06/2014  . Essential hypertension, benign 01/25/2013  . Bipolar disorder [facility])     Palliative Care Assessment & Plan   Patient Profile: 54 y.o. female  with past medical history of CKD stage IV, bipolar disorder, seizures, HTN, anemia, gastric bypass admitted on 07/19/2019 with altered mental status. Found to have severe hypocalcemia and AKI (creatinine 7.21) with elevated CPK level. Nephrology following. Kidney function trending down with good urine output. No indication for hemodialysis. Managing electrolytes. Neurology consulted and recommending EEG which is pending. Palliative medicine consultation by request of patient/goals of care.   Assessment: ARF on CKD stage IV Epilepsy Hypocalcemia Vitamin D deficiency Hypomagnesemia Bipolar disorder  Recommendations/Plan:  F/u GOC discussion with patient.   Reviewed AD packet. She wishes for sister Izola Price) and nephew [name]) to be documented HCPOA. She plans to review AD packet and gather addresses with sister before completing.   Patient ready to complete MOST form today. Patient's wishes include: DNR/DNI, NO dialysis, limited additional interventions including re-hospitalization for medical management/non-invasive airway support if indicated, IVF/ABX if indicated, NO feeding tube. Vynca electronic MOST completed. Durable DNR completed.   Continue outpatient nephrology follow-up.   Continue PT attempts.   May benefit from outpatient palliative follow-up for intermittent support.    Code Status: DNR/DNI   Code Status Orders  (From admission, onward)         Start     Ordered   07/24/19 1121  Do not attempt resuscitation (DNR)  Continuous    Question Answer Comment  In the event of cardiac or respiratory ARREST Do not call a "code blue"   In the event of cardiac  or respiratory ARREST Do not perform Intubation, CPR, defibrillation or ACLS   In the event of cardiac or respiratory ARREST Use medication by any route, position, wound care, and other measures to relive pain and suffering. May use oxygen, suction and manual treatment of airway obstruction as needed for comfort.      07/24/19 1120        Code Status History    Date Active Date Inactive Code Status Order ID Comments User Context   07/20/2019 0417 07/24/2019 1120 Full Code [id number]  Oswald Hillock, MD Inpatient   09/08/2018 2139 09/11/2018 1537 Full Code [id number]  Truett Mainland, DO Inpatient   03/07/2015 1301 03/09/2015 1601 Full Code [id number]  Thurnell Lose, MD Inpatient   01/24/2013 2348 01/27/2013 1449 Full Code [id number]  Berle Mull, MD ED   Advance Care Planning Activity       Prognosis:   Unable to determine  Discharge Planning:  Home with [place] was discussed with patient, RN, Dr. Denton Brick  Thank you for allowing the Palliative Medicine Team to assist in the care of this patient.   Time In: 1020 Time Out: 1135 Total Time 75 Prolonged Time Billed  yes      Greater than 50%  of this time was spent counseling and coordinating care related to the above assessment and plan.  Ihor Dow, DNP, FNP-C Palliative Medicine Team  Phone: [phone] Fax: [phone]  Please contact Palliative Medicine Team phone at [phone] for questions and concerns.

## 2019-07-24 NOTE — Progress Notes (Signed)
PROGRESS NOTE   [name]  GHW:[id number] DOB: 01-19-1966 DOA: 07/19/2019 PCP: Pixie Casino, MD   Chief Complaint  Patient presents with  . Altered Mental Status    Brief Narrative:  54 year old female with history of epilepsy, HTN,  bipolar disorder, CKD, presented to the hospital with altered mentation and confusion and found to have severe hypocalcemia and AKI with an elevated CPK level.  Assessment & Plan:   Active Problems:   Bipolar disorder (HCC)   Vitamin B12 deficiency (dietary) anemia   Chronic kidney disease, stage IV (severe) (HCC)   Herpes simplex vulvovaginitis   Seizure (HCC)   Acute renal failure superimposed on stage 4 chronic kidney disease (HCC)   B12 deficiency   Vitamin D deficiency   Hypocalcemia   Palliative care by specialist   Goals of care, counseling/discussion   DNR (do not resuscitate)    1. AKI  on CKD stage IV-patient has reported history of poor compliance with medical treatments possibly related to her bipolar disorder. -Improved with IV fluids, nephrologist consult appreciated,  -Urine output appears adequate,   2. epilepsy-continue home dose Keppra as ordered.  No seizure activity. --- EEG unremarkable, -Nephrology consult appreciated  3) acute metabolic encephalopathy--- mentation appears to be improving close to baseline, -Encephalopathy was most likely secondary to AKI on CKD (renal disturbance)  4)Rhabdo--due to CKs noted, improved with hydration  5)Acute on chronic anemia--suspect baseline anemia of CKD hemoglobin drifting down now with IV fluids -No evidence of ongoing blood loss at this time -S/p Fereheme 5/3; will start ESA - aranesp 40 mcg 5/5    6)Hypocalcemia: PTH 722, 25 Vit D = 17; --improving, Cont Ca Carbonate (not meal time dosing) for #2 Cont calcitriol 0.68mcg/d,On vitamin D 50,000 units weekly; discontinued daily vitamin D for now 1gm Cal Gluc now  7)B12 deficiency- B12 level now within normal  limits.  8)Hypomagnesemia -replace and recheck   9)HSV carrier-patient is on suppressive therapy with Valtrex which we have continued.  10)Bipolar disorder-stable, continue PTA meds   DVT prophylaxis: Subcu heparin Code Status: Full Family Communication:  patient's sister Miriel   Disposition:   Status is: Inpatient  Remains inpatient appropriate because:Persistent severe electrolyte disturbances and IV treatments appropriate due to intensity of illness or inability to take PO  Dispo: The patient is from: Home              Anticipated d/c is to: Home with [facility]              Anticipated d/c date is: 1 days              Patient currently is not medically stable to d/c.   Consultants:   Nephrology   Procedures:     Antimicrobials:     Subjective: -Voiding well, oral intake is not great, -No emesis, however complains of nausea  Objective: Vitals:   07/23/19 0524 07/23/19 2100 07/24/19 0533 07/24/19 1500  BP:  (!) 160/112 (!) 169/101 (!) 153/101  Pulse:  83 85 85  Resp:  20 16 18   Temp:  98.6 F (37 C) 98.6 F (37 C) 98.8 F (37.1 C)  TempSrc:  Oral Oral Oral  SpO2:  100% 100% 99%  Weight: 78.2 kg     Height:        Intake/Output Summary (Last 24 hours) at 07/24/2019 1813 Last data filed at 07/24/2019 1757 Gross per 24 hour  Intake 2336.85 ml  Output --  Net 2336.85 ml  Filed Weights   07/19/19 1849 07/22/19 0719 07/23/19 0524  Weight: 77.1 kg 82.5 kg 78.2 kg    Examination:  General exam: Stress Respiratory system: Clear to auscultation. Respiratory effort normal. Cardiovascular system: S1 & S2 heard, RRR. No JVD, murmurs, rubs, gallops or clicks. No pedal edema. Gastrointestinal system: Abdomen is nondistended, soft and nontender.  Normal bowel sounds heard. Central nervous system: Alert and oriented.  Generalized weakness, no focal neurological deficits. Extremities: Symmetric 5 x 5 power. Skin: No rashes, lesions or ulcers Psychiatry: Judgement and  insight appear normal. Mood & affect appropriate.   Data Reviewed:   CBC: Recent Labs  Lab 07/19/19 2015 07/20/19 0630 07/21/19 0613 07/22/19 0515 07/24/19 0552  WBC 5.8 5.4 4.6 4.3 3.8*  NEUTROABS 4.2  --  2.9 2.5  --   HGB 8.5* 8.1* 7.8* 7.6* 7.7*  HCT 27.5* 25.8* 25.1* 25.4* 24.9*  MCV 95.2 94.5 96.2 96.2 96.5  PLT 164 160 163 145* 315    Basic Metabolic Panel: Recent Labs  Lab 07/19/19 2015 07/19/19 2015 07/20/19 0330 07/20/19 0630 07/21/19 1761 07/22/19 0514 07/22/19 0515 07/23/19 0552 07/24/19 0552  NA 135   < >  --  137 140 141  --  140 139  K 3.6   < >  --  3.5 3.5 3.5  --  3.6 3.7  CL 103   < >  --  105 110 111  --  108 112*  CO2 17*   < >  --  16* 19* 18*  --  19* 20*  GLUCOSE 86   < >  --  79 82 83  --  99 85  BUN 98*   < >  --  96* 85* 76*  --  69* 60*  CREATININE 7.21*   < >  --  6.71* 5.99* 5.58*  --  5.16* 4.97*  CALCIUM 5.4*   < >  --  5.3* 5.2* 5.4*  --  5.9* 6.4*  MG 1.8  --   --   --   --   --  1.4* 1.8 1.7  PHOS  --   --  7.5*  --  5.6* 5.5*  --  4.6 4.3   < > = values in this interval not displayed.    GFR: Estimated Creatinine Clearance: 11.8 mL/min (A) (by C-G formula based on SCr of 4.97 mg/dL (H)).  Liver Function Tests: Recent Labs  Lab 07/19/19 2015 07/21/19 6073 07/22/19 0514 07/23/19 0552 07/24/19 0552  AST 29  --   --   --   --   ALT 15  --   --   --   --   ALKPHOS 122  --   --   --   --   BILITOT 0.8  --   --   --   --   PROT 6.6  --   --   --   --   ALBUMIN 3.3* 3.0* 2.9* 3.0* 2.9*    CBG: No results for input(s): GLUCAP in the last 168 hours.   Recent Results (from the past 240 hour(s))  Respiratory Panel by RT PCR (Flu A&B, Covid) - Nasopharyngeal Swab     Status: None   Collection Time: 07/19/19 11:00 PM   Specimen: Nasopharyngeal Swab  Result Value Ref Range Status   SARS Coronavirus 2 by RT PCR NEGATIVE NEGATIVE Final    Comment: (NOTE) SARS-CoV-2 target nucleic acids are NOT DETECTED. The SARS-CoV-2 RNA  is generally detectable in  upper respiratoy specimens during the acute phase of infection. The lowest concentration of SARS-CoV-2 viral copies this assay can detect is 131 copies/mL. A negative result does not preclude SARS-Cov-2 infection and should not be used as the sole basis for treatment or other patient management decisions. A negative result may occur with  improper specimen collection/handling, submission of specimen other than nasopharyngeal swab, presence of viral mutation(s) within the areas targeted by this assay, and inadequate number of viral copies (<131 copies/mL). A negative result must be combined with clinical observations, patient history, and epidemiological information. The expected result is Negative. Fact Sheet for Patients:  PinkCheek.be Fact Sheet for Healthcare Providers:  GravelBags.it This test is not yet ap proved or cleared by the [place] FDA and  has been authorized for detection and/or diagnosis of SARS-CoV-2 by FDA under an Emergency Use Authorization (EUA). This EUA will remain  in effect (meaning this test can be used) for the duration of the COVID-19 declaration under Section 564(b)(1) of the Act, 21 U.S.C. section 360bbb-3(b)(1), unless the authorization is terminated or revoked sooner.    Influenza A by PCR NEGATIVE NEGATIVE Final   Influenza B by PCR NEGATIVE NEGATIVE Final    Comment: (NOTE) The Xpert Xpress SARS-CoV-2/FLU/RSV assay is intended as an aid in  the diagnosis of influenza from Nasopharyngeal swab specimens and  should not be used as a sole basis for treatment. Nasal washings and  aspirates are unacceptable for Xpert Xpress SARS-CoV-2/FLU/RSV  testing. Fact Sheet for Patients: PinkCheek.be Fact Sheet for Healthcare Providers: GravelBags.it This test is not yet approved or cleared by the [place] FDA and   has been authorized for detection and/or diagnosis of SARS-CoV-2 by  FDA under an Emergency Use Authorization (EUA). This EUA will remain  in effect (meaning this test can be used) for the duration of the  Covid-19 declaration under Section 564(b)(1) of the Act, 21  U.S.C. section 360bbb-3(b)(1), unless the authorization is  terminated or revoked. Performed at [facility], [address] [place], [place] [address]     Radiology Studies: EEG adult  Result Date: 08/08/19 Phillips Odor, MD     2019/08/08  6:07 PM Parchment A. Merlene Laughter, MD     [email or web address]       HISTORY: The patient is a 54 year old who presents with altered mental status worrisome for nonconvulsive seizures. MEDICATIONS: Current Facility-Administered Medications: .  acetaminophen (TYLENOL) tablet 650 mg, 650 mg, Oral, Q8H PRN, Johnson, Clanford L, MD .  aspirin EC tablet 81 mg, 81 mg, Oral, Daily, Darrick Meigs, Marge Duncans, MD, 81 mg at 08-08-19 1054 .  calcitRIOL (ROCALTROL) capsule 0.5 mcg, 0.5 mcg, Oral, Daily, Pearson Grippe B, MD, 0.5 mcg at 08/08/19 1053 .  calcium carbonate (OS-CAL - dosed in mg of elemental calcium) tablet 1,000 mg of elemental calcium, 1,000 mg of elemental calcium, Oral, TID BM, Sanford, Ryan B, MD, 1,000 mg of elemental calcium at 08/08/19 1604 .  calcium carbonate (TUMS - dosed in mg elemental calcium) chewable tablet 200 mg of elemental calcium, 1 tablet, Oral, TID WC, Johnson, Clanford L, MD, 200 mg of elemental calcium at 08-08-19 1717 .  carvedilol (COREG) tablet 25 mg, 25 mg, Oral, BID WC, Darrick Meigs, Marge Duncans, MD, 25 mg at Aug 08, 2019 1717 .  Chlorhexidine Gluconate Cloth 2 % PADS 6 each, 6 each, Topical, Daily, Johnson, Clanford L, MD, 6 each at 08-08-2019 1055 .  cholecalciferol (VITAMIN D3) tablet 2,000 Units, 2,000 Units, Oral, Daily, Rexene Agent, MD,  2,000 Units at 07/23/19 1054 .  ferumoxytol (FERAHEME) 510 mg in sodium chloride 0.9 % 100 mL IVPB, 510 mg, Intravenous, Weekly, Pearson Grippe  B, MD, Last Rate: 468 mL/hr at 07/22/19 1423, 510 mg at 07/22/19 1423 .  heparin injection 5,000 Units, 5,000 Units, Subcutaneous, Q8H, Johnson, Clanford L, MD, 5,000 Units at 07/23/19 1449 .  labetalol (NORMODYNE) injection 10 mg, 10 mg, Intravenous, Q2H PRN, Johnson, Clanford L, MD, 10 mg at 07/22/19 2150 .  lactated ringers infusion, , Intravenous, Continuous, Johnson, Clanford L, MD, Last Rate: 75 mL/hr at 07/23/19 0245, New Bag at 07/23/19 0245 .  levETIRAcetam (KEPPRA XR) 24 hr tablet 500 mg, 500 mg, Oral, Daily, Darrick Meigs, Marge Duncans, MD, 500 mg at 07/23/19 1055 .  oxyCODONE (Oxy IR/ROXICODONE) immediate release tablet 2.5-5 mg, 2.5-5 mg, Oral, Q6H PRN, Wynetta Emery, Clanford L, MD, 5 mg at 07/21/19 2337 .  sodium bicarbonate tablet 650 mg, 650 mg, Oral, BID, Darrick Meigs, Marge Duncans, MD, 650 mg at 07/23/19 1055 .  valACYclovir (VALTREX) tablet 500 mg, 500 mg, Oral, Daily, Darrick Meigs, Marge Duncans, MD, 500 mg at 07/23/19 1054 .  Vitamin D (Ergocalciferol) (DRISDOL) capsule 50,000 Units, 50,000 Units, Oral, Q7 days, Wynetta Emery, Clanford L, MD, 50,000 Units at 07/21/19 1723 ANALYSIS: A 16 channel recording using standard 10 20 measurements is conducted for 29 minutes.  The background activity gets as high as 11 to 12 Hz.  There is beta activity observed in frontal areas.  There is increased amount of myogenic interference noted throughout the recording.  There are episodes of generalized delta slowing especially in the frontocentral region.  There are evidence suggestive of stage II REM sleep occurring sporadically with nonsustained K complexes and spindles.  Photic stimulation and hyperventilation are not conducted.  There is no focal or lateralized slowing.  There is no epileptiform activities noted. IMPRESSION: 1.  Recording shows episodes of generalized delta slowing typically seen in toxic metabolic processes.  Otherwise the recording is unrevealing.  No epileptiform discharges are noted. Kofi A. Merlene Laughter, M.D. Diplomate, Tax adviser of  Psychiatry and Neurology ( Neurology).    Scheduled Meds: . aspirin EC  81 mg Oral Daily  . calcitRIOL  0.5 mcg Oral Daily  . calcium carbonate  1,000 mg of elemental calcium Oral TID BM  . calcium carbonate  1 tablet Oral TID WC  . carvedilol  25 mg Oral BID WC  . Chlorhexidine Gluconate Cloth  6 each Topical Daily  . heparin injection (subcutaneous)  5,000 Units Subcutaneous Q8H  . levETIRAcetam  500 mg Oral Daily  . sodium bicarbonate  650 mg Oral BID  . valACYclovir  500 mg Oral Daily  . Vitamin D (Ergocalciferol)  50,000 Units Oral Q7 days   Continuous Infusions: . ferumoxytol 510 mg (07/22/19 1423)  . lactated ringers 75 mL/hr at 07/23/19 0245  . magnesium sulfate bolus IVPB       LOS: 4 days   Roxan Hockey, MD [facility]  07/24/2019, 6:13 PM

## 2019-07-24 NOTE — Progress Notes (Signed)
Admit: 07/19/2019 LOS: 4  32F AoCKD4, AMS, inc CK 1900, hypocalcemia  Subjective:  Strict ins/outs not available.  She had 2 unmeasured voids over 5/4.  She confirms that she would refuse HD if needed.  Ca up to 6.4, Alb 2.9.  She has denied recent Prolia therapy.  Still doesn't feel like herself.  She states that she's urinating quite a bit.  We discussed risks/benefits/indications of ESA and she is willing to start.   Review of systems:  Denies n/v Denies shortness of breath  Denies cp     05/04 0701 - 05/05 0700 In: 1616.9 [I.V.:1616.9] Out: -   Filed Weights   07/19/19 1849 07/22/19 0719 07/23/19 0524  Weight: 77.1 kg 82.5 kg 78.2 kg    Scheduled Meds: . aspirin EC  81 mg Oral Daily  . calcitRIOL  0.5 mcg Oral Daily  . calcium carbonate  1,000 mg of elemental calcium Oral TID BM  . calcium carbonate  1 tablet Oral TID WC  . carvedilol  25 mg Oral BID WC  . Chlorhexidine Gluconate Cloth  6 each Topical Daily  . cholecalciferol  2,000 Units Oral Daily  . heparin injection (subcutaneous)  5,000 Units Subcutaneous Q8H  . levETIRAcetam  500 mg Oral Daily  . sodium bicarbonate  650 mg Oral BID  . valACYclovir  500 mg Oral Daily  . Vitamin D (Ergocalciferol)  50,000 Units Oral Q7 days   Continuous Infusions: . calcium gluconate    . ferumoxytol 510 mg (07/22/19 1423)  . lactated ringers 75 mL/hr at 07/23/19 0245   PRN Meds:.acetaminophen, labetalol, oxyCODONE  Current Labs: reviewed  Results for LAMIYAH, SCHLOTTER (MRN [id number]) as of 07/22/2019 09:31  Ref. Range 07/21/2019 06:13  Saturation Ratios Latest Ref Range: 10.4 - 31.8 % 14  Ferritin Latest Ref Range: 11 - 307 ng/mL 15    Physical Exam:  Blood pressure (!) 169/101, pulse 85, temperature 98.6 F (37 C), temperature source Oral, resp. rate 16, height _0  (1.499 m), weight 78.2 kg, SpO2 100 %.  Adult female in bed in NAD Awake, alert, interactive S1S2; no rub CTAB; unlabored No sig LEE psych normal mood  and affect  A 1. AoCKD4: mild inc in CK possibly contributing.  Nonoliguric.  Resolving with supportive care.  No HD indicated; partially volume responsive suggesting hypovolemia at presentation. Pt confirms she would choose not to receive HD if indicated 2. Hypocalcemia: PTH 722, 25 Vit D = 17; slowly improving, not sure this is just 2HPTH 3. Increased CK, mild  Rhabdo; likely 4. Anemia; downtrending with hydration   5. AG Met acidosis; improving and mild  6. AMS, seems improving per charting and pt   P   . Can continue IV fluids - LR . Cont Ca Carbonate (not meal time dosing) for #2 . Cont calcitriol 0.21mg/d . On vitamin D 50,000 units weekly; discontinued daily vitamin D for now . 1gm Cal Gluc now . S/p FMadilyn Hook5/3; will start ESA - aranesp 40 mcg 5/5  . Strict ins/outs . Medication Issues; o Preferred narcotic agents for pain control are hydromorphone, fentanyl, and methadone. Morphine should not be used.  o Baclofen should be avoided o Avoid oral sodium phosphate and magnesium citrate based laxatives / bowel preps    LClaudia Desanctis MD  07/24/2019, 9:23 AM   Recent Labs  Lab 07/22/19 0514 07/23/19 0552 07/24/19 0552  NA 141 140 139  K 3.5 3.6 3.7  CL 111 108 112*  CO2  18* 19* 20*  GLUCOSE 83 99 85  BUN 76* 69* 60*  CREATININE 5.58* 5.16* 4.97*  CALCIUM 5.4* 5.9* 6.4*  PHOS 5.5* 4.6 4.3   Recent Labs  Lab 07/19/19 2015 07/20/19 0630 07/21/19 0613 07/22/19 0515 07/24/19 0552  WBC 5.8   < > 4.6 4.3 3.8*  NEUTROABS 4.2  --  2.9 2.5  --   HGB 8.5*   < > 7.8* 7.6* 7.7*  HCT 27.5*   < > 25.1* 25.4* 24.9*  MCV 95.2   < > 96.2 96.2 96.5  PLT 164   < > 163 145* 157   < > = values in this interval not displayed.

## 2019-07-24 NOTE — Progress Notes (Signed)
CRITICAL VALUE ALERT  Critical Value:  Calcium 6.4  Date & Time Notied:  07-24-19 0648  Provider Notified: [place]  Orders Received/Actions taken: pending any new orders

## 2019-07-24 NOTE — Care Management Important Message (Signed)
Important Message  Patient Details  Name: [name] MRN: [id number] Date of Birth: [birth year]   Medicare Important Message Given:  Yes     Tommy Medal 07/24/2019, 3:07 PM

## 2019-07-25 LAB — RENAL FUNCTION PANEL
Albumin: 2.9 g/dL — ABNORMAL LOW (ref 3.5–5.0)
Anion gap: 13 (ref 5–15)
BUN: 60 mg/dL — ABNORMAL HIGH (ref 6–20)
CO2: 21 mmol/L — ABNORMAL LOW (ref 22–32)
Calcium: 6.8 mg/dL — ABNORMAL LOW (ref 8.9–10.3)
Chloride: 108 mmol/L (ref 98–111)
Creatinine, Ser: 4.95 mg/dL — ABNORMAL HIGH (ref 0.44–1.00)
GFR calc Af Amer: 11 mL/min — ABNORMAL LOW (ref >60)
GFR calc non Af Amer: 9 mL/min — ABNORMAL LOW (ref >60)
Glucose, Bld: 95 mg/dL (ref 70–99)
Phosphorus: 4.6 mg/dL (ref 2.5–4.6)
Potassium: 3.9 mmol/L (ref 3.5–5.1)
Sodium: 142 mmol/L (ref 135–145)

## 2019-07-25 MED ORDER — ASPIRIN EC 81 MG PO TBEC: 81.0000 mg | DELAYED_RELEASE_TABLET | Freq: Every day | ORAL | 2 refills | Status: DC

## 2019-07-25 MED ORDER — FUROSEMIDE 80 MG PO TABS: 80.0000 mg | ORAL_TABLET | Freq: Every day | ORAL | 1 refills | Status: DC

## 2019-07-25 MED ORDER — VENLAFAXINE HCL ER 150 MG PO CP24: 150.0000 mg | ORAL_CAPSULE | Freq: Every day | ORAL | 2 refills | Status: DC

## 2019-07-25 MED ORDER — HYDROXYZINE HCL 25 MG PO TABS: 25.0000 mg | ORAL_TABLET | Freq: Two times a day (BID) | ORAL | 2 refills | Status: AC

## 2019-07-25 MED ORDER — CALCIUM GLUCONATE-NACL 1-0.675 GM/50ML-% IV SOLN
1.0000 g | Freq: Once | INTRAVENOUS | Status: AC
  Administered 2019-07-25: 13:00:00 1000 mg via INTRAVENOUS
  Filled 2019-07-25: qty 50

## 2019-07-25 MED ORDER — CARVEDILOL 25 MG PO TABS: 25.0000 mg | ORAL_TABLET | Freq: Two times a day (BID) | ORAL | 3 refills | Status: DC

## 2019-07-25 MED ORDER — CALCIUM CARBONATE 1250 (500 CA) MG PO TABS: 2.0000 | ORAL_TABLET | Freq: Two times a day (BID) | ORAL | 3 refills | Status: DC

## 2019-07-25 MED ORDER — LEVETIRACETAM ER 500 MG PO TB24: 500.0000 mg | ORAL_TABLET | Freq: Every day | ORAL | 3 refills | Status: DC

## 2019-07-25 MED ORDER — FERROUS FUMARATE 324 (106 FE) MG PO TABS: 1.0000 | ORAL_TABLET | Freq: Every day | ORAL | 3 refills | Status: DC

## 2019-07-25 MED ORDER — VITAMIN B-12 1000 MCG PO TABS: 1000.0000 ug | ORAL_TABLET | Freq: Every day | ORAL | 3 refills | Status: DC

## 2019-07-25 MED ORDER — SODIUM BICARBONATE 650 MG PO TABS: 650.0000 mg | ORAL_TABLET | Freq: Two times a day (BID) | ORAL | 2 refills | Status: DC

## 2019-07-25 NOTE — Progress Notes (Addendum)
Admit: 07/19/2019 LOS: 5  44F AoCKD4, AMS, inc CK 1900, hypocalcemia  Subjective:  Strict ins/outs are not available.  She had 4 unmeasured voids over 5/5.  She confirms that she would refuse HD if needed and I discussed with her outpatient nephrologist on 5/5 and this is consistent with their prior discussions as well; her provider does note history of psychiatric illness.  She has denied recent Prolia therapy.  She was a little frustrated when a gentleman came in to talk with her early in the morning sometime during her stay - she's not sure who it was and she hadn't woken up yet.    Review of systems:   Denies n/v Denies shortness of breath  Denies cp     05/05 0701 - 05/06 0700 In: 2325.5 [P.O.:720; I.V.:1605.5] Out: -   Filed Weights   07/19/19 1849 07/22/19 0719 07/23/19 0524  Weight: 77.1 kg 82.5 kg 78.2 kg    Scheduled Meds: . aspirin EC  81 mg Oral Daily  . calcitRIOL  0.5 mcg Oral Daily  . calcium carbonate  1,000 mg of elemental calcium Oral TID BM  . calcium carbonate  1 tablet Oral TID WC  . carvedilol  25 mg Oral BID WC  . Chlorhexidine Gluconate Cloth  6 each Topical Daily  . heparin injection (subcutaneous)  5,000 Units Subcutaneous Q8H  . levETIRAcetam  500 mg Oral Daily  . sodium bicarbonate  650 mg Oral BID  . valACYclovir  500 mg Oral Daily  . Vitamin D (Ergocalciferol)  50,000 Units Oral Q7 days   Continuous Infusions: . ferumoxytol 510 mg (07/22/19 1423)  . lactated ringers 75 mL/hr at 07/25/19 0732   PRN Meds:.acetaminophen, labetalol, oxyCODONE  Current Labs: reviewed  Results for IVIONNA, VERLEY (MRN [id number]) as of 07/22/2019 09:31  Ref. Range 07/21/2019 06:13  Saturation Ratios Latest Ref Range: 10.4 - 31.8 % 14  Ferritin Latest Ref Range: 11 - 307 ng/mL 15    Physical Exam:  Blood pressure (!) 156/102, pulse 84, temperature 98.7 F (37.1 C), temperature source Oral, resp. rate 16, height 4' 11"  (1.499 m), weight 78.2 kg, SpO2 100 %.   Adult female in bed in NAD  Awake, alert, interactive S1S2; no rub CTAB; unlabored No sig LEE psych normal mood and affect  A 1. AoCKD4: mild inc in CK possibly contributing.  Nonoliguric.  Resolving with supportive care.  No HD indicated; partially volume responsive suggesting hypovolemia at presentation. Pt confirms she would choose not to receive HD if indicated 2. Hypocalcemia: PTH 722, 25 Vit D = 17; slowly improving, not sure this is just 2HPTH 3. Increased CK, mild  Rhabdo; likely 4. Anemia; downtrending with hydration   5. AG Met acidosis; improving and mild  6. AMS, seems improving per charting and pt   P   . Can continue IV fluids - LR . Cont Ca Carbonate (between meals) for #2 . Also on calcium with meals for binder . Cont calcitriol 0.52mg/d . On vitamin D 50,000 units weekly; discontinued daily vitamin D for now . 1gm Cal Gluc now   . S/p Fereheme 5/3; gave aranesp 40 mcg on 5/5  . On sodium bicarb low dose - continue  . Ordered strict ins/outs . Medication Issues; o Preferred narcotic agents for pain control are hydromorphone, fentanyl, and methadone. Morphine should not be used.  o Baclofen should be avoided o Avoid oral sodium phosphate and magnesium citrate based laxatives / bowel preps   Hopeful for  discharge soon.  Stable from a renal standpoint.  disposition per primary team   Claudia Desanctis, MD  07/25/2019, 9:57 AM   Recent Labs  Lab 07/23/19 0552 07/24/19 0552 07/25/19 0418  NA 140 139 142  K 3.6 3.7 3.9  CL 108 112* 108  CO2 19* 20* 21*  GLUCOSE 99 85 95  BUN 69* 60* 60*  CREATININE 5.16* 4.97* 4.95*  CALCIUM 5.9* 6.4* 6.8*  PHOS 4.6 4.3 4.6   Recent Labs  Lab 07/19/19 2015 07/20/19 0630 07/21/19 0613 07/22/19 0515 07/24/19 0552  WBC 5.8   < > 4.6 4.3 3.8*  NEUTROABS 4.2  --  2.9 2.5  --   HGB 8.5*   < > 7.8* 7.6* 7.7*  HCT 27.5*   < > 25.1* 25.4* 24.9*  MCV 95.2   < > 96.2 96.2 96.5  PLT 164   < > 163 145* 157   < > = values in  this interval not displayed.

## 2019-07-25 NOTE — Plan of Care (Signed)

## 2019-07-25 NOTE — Discharge Summary (Signed)
[name], is a 54 y.o. female  DOB Dec 09, 1965  MRN [id number].  Admission date:  07/19/2019  Admitting Physician  Murlean Iba, MD  Discharge Date:  07/25/2019   Primary MD  Pixie Casino, MD  Recommendations for primary care physician for things to follow:   1)Avoid ibuprofen/Advil/Aleve/Motrin/Goody Powders/Naproxen/BC powders/Meloxicam/Diclofenac/Indomethacin and other Nonsteroidal anti-inflammatory medications as these will make you more likely to bleed and can cause stomach ulcers, can also cause Kidney problems.   2)Stop Tramadol as it can increase Seizure risks  3)Please follow-up with Neurologist Dr. Phillips Odor-- Phone: [phone], Address: [address], [place], [place] [address] in 4 to 6 weeks for recheck and reevaluation.  Please call to make appointment with him  4)Outpatient follow-up with nephrologist/kidney specialist in a couple weeks for repeat BMP and CBC test and reevaluation advised  Admission Diagnosis  Seizure ([place]) [R56.9] Acute renal failure superimposed on stage 4 chronic kidney disease ([place]) [N17.9, N18.4]  Discharge Diagnosis  Seizure ([place]) [R56.9] Acute renal failure superimposed on stage 4 chronic kidney disease ([place]) [N17.9, N18.4]    Active Problems:   Bipolar disorder ([place])   Vitamin B12 deficiency (dietary) anemia   Chronic kidney disease, stage IV (severe) ([place])   Herpes simplex vulvovaginitis   Seizure ([place])   Acute renal failure superimposed on stage 4 chronic kidney disease ([place])   B12 deficiency   Vitamin D deficiency   Hypocalcemia   Palliative care by specialist   Goals of care, counseling/discussion   DNR (do not resuscitate)      Past Medical History:  Diagnosis Date  . Anemia   . Anxiety   . Bipolar disorder ([place])   . Depression   . History of blood transfusion   . Hypertension   . IUD    HISTORY OF IUD --REMOVED  IN 2006  . Seizures ([place]) [id number]   two seizures due to a med changes    Past Surgical History:  Procedure Laterality Date  . CARPAL TUNNEL RELEASE Right 07/08/2014   Procedure: RIGHT CARPAL TUNNEL RELEASE;  Surgeon: Sanjuana Kava, MD;  Location: [facility];  Service: Orthopedics;  Laterality: Right;  . CHOLECYSTECTOMY    . GASTRIC BYPASS  2000  . HYSTEROSCOPY WITH D & C  11/14/2011   Procedure: DILATATION AND CURETTAGE /HYSTEROSCOPY;  Surgeon: Marylynn Pearson, MD;  Location: [facility];  Service: Gynecology;;  with removal of polyps  . INTRAUTERINE DEVICE (IUD) INSERTION  03/21/2010  . LAPAROSCOPIC GASTROTOMY W/ REPAIR OF ULCER    . PANNICULECTOMY    . ROTATOR CUFF REPAIR       HPI  from the history and physical done on the day of admission:    [name]  is a 54 y.o. female, with history of seizure disorder, hypertension, bipolar disorder was brought to hospital with altered mental status.  Patient has history of seizure but there is no witnessed seizure activity.  In the ED, patient was found to have worsening renal function with elevated CPK 1900,  hypocalcemia. Patient is more alert, able to give some history but still mildly confused. She denies chest pain or shortness of breath. Denies biting of tongue or bladder incontinence. Does not remember medication history including Keppra. Denies abdominal pain or dysuria     Hospital Course:      Brief Narrative:  54 year old female with history of epilepsy, HTN,  bipolar disorder, CKD, presented to the hospital with altered mentation and confusion and found to have severe hypocalcemia and AKI with an elevated CPK level.  Assessment & Plan:   Active Problems:   Bipolar disorder ([facility])   Vitamin B12 deficiency (dietary) anemia   Chronic kidney disease, stage IV (severe) ([facility])   Herpes simplex vulvovaginitis   Seizure ([facility])   Acute renal failure superimposed on stage 4 chronic kidney disease ([facility])   B12 deficiency   Vitamin D  deficiency   Hypocalcemia   Palliative care by specialist   Goals of care, counseling/discussion   DNR (do not resuscitate)    1. AKI  on CKD stage IV-patient has reported history of poor compliance with medical treatments possibly related to her bipolar disorder. -Improved with IV fluids, nephrologist consult appreciated,  -Urine output appears adequate,  -Creatinine 4.95, patient declines plans for hemodialysis  2. epilepsy-continue home dose Keppra as ordered.  No seizure activity. --- EEG unremarkable, -Nephrology consult appreciated -Stop tramadol as this can decrease seizure threshold, -Follow-up with neurologist Dr. Merlene Laughter as outpatient  3) acute metabolic encephalopathy--- mentation appears to be improving close to baseline, -Encephalopathy was most likely secondary to AKI on CKD (renal disturbance) -Outpatient neurology follow-up as advised  4)Rhabdo--due to CKs noted, improved with hydration  5)Acute on chronic anemia--suspect baseline anemia of CKD hemoglobin drifting down now with IV fluids -No evidence of ongoing blood loss at this time -S/p Madilyn Hook 5/3;patient received ESA- aranesp 40 mcg on 07/24/19 -Follow-up with nephrologist as outpatient as advised, patient may need IV iron infusion on an ongoing ESA as outpatient   6)Hypocalcemia: PTH 722, 25 Vit D = 17; --improving, Cont Ca Carbonate (not meal time dosing) for #2 Contcalcitriol0.54mcg/d,On vitamin D 50,000 units weekly; discontinued daily vitamin D for now 1gm Cal Gluc now  7)B12 deficiency- B12 level now within normal limits.  8)HSV carrier-patient is on suppressive therapy with Valtrex which we have continued.  9)Bipolar disorder-stable, continue PTA meds   DVT prophylaxis: Subcu heparin Code Status: Full Family Communication:  patient's sister Miriel   Disposition:  Discharge home    Consultants:   Nephrology  and neurologist   Discharge Condition: stable  Follow  UP  Follow-up Information    [facility] [place] Follow up.   Specialty: [place] Why: [facility] Staff will call you to schedule home therapy visits Contact information: [address] [place] [address] [phone]        Phillips Odor, MD Follow up in 6 week(s).   Specialty: Neurology Why: Follow-up on seizure disorder Contact information: [address] [place] [address] [phone]            Diet and Activity recommendation:  As advised  Discharge Instructions    Discharge Instructions    Call MD for:  difficulty breathing, headache or visual disturbances   Complete by: As directed    Call MD for:  extreme fatigue   Complete by: As directed    Call MD for:  persistant dizziness or light-headedness   Complete by: As directed    Call  MD for:  persistant nausea and vomiting   Complete by: As directed    Call MD for:  severe uncontrolled pain   Complete by: As directed    Call MD for:  temperature >100.4   Complete by: As directed    Diet - low sodium heart healthy   Complete by: As directed    Discharge instructions   Complete by: As directed    1)Avoid ibuprofen/Advil/Aleve/Motrin/Goody Powders/Naproxen/BC powders/Meloxicam/Diclofenac/Indomethacin and other Nonsteroidal anti-inflammatory medications as these will make you more likely to bleed and can cause stomach ulcers, can also cause Kidney problems.   2)Stop Tramadol as it can increase Seizure risks  3)Please follow-up with Neurologist Dr. Phillips Odor-- Phone: [phone], Address: [address], [place], [place] [address] in 4 to 6 weeks for recheck and reevaluation.  Please call to make appointment with him  4)Outpatient follow-up with nephrologist/kidney specialist in a couple weeks for repeat BMP and CBC test and reevaluation advised   Increase activity slowly   Complete by: As directed         Discharge Medications     Allergies  as of 07/25/2019      Reactions   Ambien [zolpidem Tartrate]    "BLACK OUT", SLEEP DRIVING   Geodon [ziprasidone Hcl]    Seizure activity   Lactulose Itching   Ondansetron Nausea And Vomiting   Quetiapine    Other reaction(s): Other (See Comments) Possible Hair Loss   Sulfa Antibiotics Rash   Zolpidem Rash      Medication List    STOP taking these medications   traMADol 50 MG tablet Commonly known as: ULTRAM     TAKE these medications   aspirin EC 81 MG tablet Take 1 tablet (81 mg total) by mouth daily with breakfast. What changed: when to take this   calcitRIOL 0.5 MCG capsule Commonly known as: ROCALTROL Take 0.5 mcg by mouth daily.   calcium carbonate 1250 (500 Ca) MG tablet Commonly known as: OS-CAL - dosed in mg of elemental calcium Take 2 tablets (1,000 mg of elemental calcium total) by mouth 2 (two) times daily with a meal.   carvedilol 25 MG tablet Commonly known as: COREG Take 1 tablet (25 mg total) by mouth 2 (two) times daily with a meal.   Ferrous Fumarate 324 (106 Fe) MG Tabs tablet Commonly known as: HEMOCYTE - 106 mg FE Take 1 tablet (106 mg of iron total) by mouth daily. What changed: additional instructions   furosemide 80 MG tablet Commonly known as: LASIX Take 1 tablet (80 mg total) by mouth daily.   hydrOXYzine 25 MG tablet Commonly known as: ATARAX/VISTARIL Take 1 tablet (25 mg total) by mouth 2 (two) times daily.   levETIRAcetam 500 MG 24 hr tablet Commonly known as: Keppra XR Take 1 tablet (500 mg total) by mouth daily.   promethazine 25 MG tablet Commonly known as: PHENERGAN Take 25 mg by mouth every 6 (six) hours as needed.   sodium bicarbonate 650 MG tablet Take 1 tablet (650 mg total) by mouth 2 (two) times daily.   tiZANidine 4 MG tablet Commonly known as: ZANAFLEX TAKE 1 TABLET BY MOUTH EVERY 12 HOURS AS NEEDED FOR SPASM What changed:   how much to take  how to take this  when to take this  reasons to take this    Tylenol 8 Hour 650 MG CR tablet Generic drug: acetaminophen Take 650 mg by mouth every 8 (eight) hours as needed for pain.  valACYclovir 1000 MG tablet Commonly known as: VALTREX TAKE 1/2 TABLET BY MOUTH DAILY   venlafaxine XR 150 MG 24 hr capsule Commonly known as: EFFEXOR-XR Take 1 capsule (150 mg total) by mouth daily with breakfast.   vitamin B-12 1000 MCG tablet Commonly known as: CYANOCOBALAMIN Take 1 tablet (1,000 mcg total) by mouth daily.       Major procedures and Radiology Reports - PLEASE review detailed and final reports for all details, in brief -    CT Head Wo Contrast  Result Date: 07/19/2019 CLINICAL DATA:  Altered mental status. EXAM: CT HEAD WITHOUT CONTRAST TECHNIQUE: Contiguous axial images were obtained from the base of the skull through the vertex without intravenous contrast. COMPARISON:  September 08, 2018 FINDINGS: Brain: No evidence of acute infarction, hemorrhage, hydrocephalus, extra-axial collection or mass lesion/mass effect. Stable extensive white matter hypoattenuation consistent with advanced small vessel disease. Vascular: No hyperdense vessel or unexpected calcification. Skull: Normal. Negative for fracture or focal lesion. Sinuses/Orbits: No acute finding. Other: None. IMPRESSION: No acute intracranial pathology. Electronically Signed   By: Virgina Norfolk M.D.   On: 07/19/2019 22:19   EEG adult  Result Date: 07/23/2019 Phillips Odor, MD     07/23/2019  6:07 PM [place] A. Merlene Laughter, MD     [email or web address]       HISTORY: The patient is a 54 year old who presents with altered mental status worrisome for nonconvulsive seizures. MEDICATIONS: Current Facility-Administered Medications: .  acetaminophen (TYLENOL) tablet 650 mg, 650 mg, Oral, Q8H PRN, Johnson, Clanford L, MD .  aspirin EC tablet 81 mg, 81 mg, Oral, Daily, Darrick Meigs, Marge Duncans, MD, 81 mg at 07/23/19 1054 .  calcitRIOL (ROCALTROL) capsule 0.5 mcg, 0.5 mcg, Oral, Daily, Pearson Grippe B, MD, 0.5 mcg at 07/23/19 1053 .  calcium carbonate (OS-CAL - dosed in mg of elemental calcium) tablet 1,000 mg of elemental calcium, 1,000 mg of elemental calcium, Oral, TID BM, Sanford, Ryan B, MD, 1,000 mg of elemental calcium at 07/23/19 1604 .  calcium carbonate (TUMS - dosed in mg elemental calcium) chewable tablet 200 mg of elemental calcium, 1 tablet, Oral, TID WC, Johnson, Clanford L, MD, 200 mg of elemental calcium at 07/23/19 1717 .  carvedilol (COREG) tablet 25 mg, 25 mg, Oral, BID WC, Darrick Meigs, Marge Duncans, MD, 25 mg at 07/23/19 1717 .  Chlorhexidine Gluconate Cloth 2 % PADS 6 each, 6 each, Topical, Daily, Johnson, Clanford L, MD, 6 each at 07/23/19 1055 .  cholecalciferol (VITAMIN D3) tablet 2,000 Units, 2,000 Units, Oral, Daily, Pearson Grippe B, MD, 2,000 Units at 07/23/19 1054 .  ferumoxytol (FERAHEME) 510 mg in sodium chloride 0.9 % 100 mL IVPB, 510 mg, Intravenous, Weekly, Pearson Grippe B, MD, Last Rate: 468 mL/hr at 07/22/19 1423, 510 mg at 07/22/19 1423 .  heparin injection 5,000 Units, 5,000 Units, Subcutaneous, Q8H, Johnson, Clanford L, MD, 5,000 Units at 07/23/19 1449 .  labetalol (NORMODYNE) injection 10 mg, 10 mg, Intravenous, Q2H PRN, Johnson, Clanford L, MD, 10 mg at 07/22/19 2150 .  lactated ringers infusion, , Intravenous, Continuous, Johnson, Clanford L, MD, Last Rate: 75 mL/hr at 07/23/19 0245, New Bag at 07/23/19 0245 .  levETIRAcetam (KEPPRA XR) 24 hr tablet 500 mg, 500 mg, Oral, Daily, Darrick Meigs, Marge Duncans, MD, 500 mg at 07/23/19 1055 .  oxyCODONE (Oxy IR/ROXICODONE) immediate release tablet 2.5-5 mg, 2.5-5 mg, Oral, Q6H PRN, Wynetta Emery, Clanford L, MD, 5 mg at 07/21/19 2337 .  sodium bicarbonate tablet 650 mg, 650 mg, Oral, BID,  Oswald Hillock, MD, 650 mg at 07/23/19 1055 .  valACYclovir (VALTREX) tablet 500 mg, 500 mg, Oral, Daily, Darrick Meigs, Marge Duncans, MD, 500 mg at 07/23/19 1054 .  Vitamin D (Ergocalciferol) (DRISDOL) capsule 50,000 Units, 50,000 Units, Oral, Q7 days, Wynetta Emery, Clanford L, MD,  50,000 Units at 07/21/19 1723 ANALYSIS: A 16 channel recording using standard 10 20 measurements is conducted for 29 minutes.  The background activity gets as high as 11 to 12 Hz.  There is beta activity observed in frontal areas.  There is increased amount of myogenic interference noted throughout the recording.  There are episodes of generalized delta slowing especially in the frontocentral region.  There are evidence suggestive of stage II REM sleep occurring sporadically with nonsustained K complexes and spindles.  Photic stimulation and hyperventilation are not conducted.  There is no focal or lateralized slowing.  There is no epileptiform activities noted. IMPRESSION: 1.  Recording shows episodes of generalized delta slowing typically seen in toxic metabolic processes.  Otherwise the recording is unrevealing.  No epileptiform discharges are noted. Kofi A. Merlene Laughter, M.D. Diplomate, Tax adviser of Psychiatry and Neurology ( Neurology).    Micro Results    Recent Results (from the past 240 hour(s))  Respiratory Panel by RT PCR (Flu A&B, Covid) - Nasopharyngeal Swab     Status: None   Collection Time: 07/19/19 11:00 PM   Specimen: Nasopharyngeal Swab  Result Value Ref Range Status   SARS Coronavirus 2 by RT PCR NEGATIVE NEGATIVE Final    Comment: (NOTE) SARS-CoV-2 target nucleic acids are NOT DETECTED. The SARS-CoV-2 RNA is generally detectable in upper respiratoy specimens during the acute phase of infection. The lowest concentration of SARS-CoV-2 viral copies this assay can detect is 131 copies/mL. A negative result does not preclude SARS-Cov-2 infection and should not be used as the sole basis for treatment or other patient management decisions. A negative result may occur with  improper specimen collection/handling, submission of specimen other than nasopharyngeal swab, presence of viral mutation(s) within the areas targeted by this assay, and inadequate number of viral copies (<131  copies/mL). A negative result must be combined with clinical observations, patient history, and epidemiological information. The expected result is Negative. Fact Sheet for Patients:  PinkCheek.be Fact Sheet for Healthcare Providers:  GravelBags.it This test is not yet ap proved or cleared by the [place] FDA and  has been authorized for detection and/or diagnosis of SARS-CoV-2 by FDA under an Emergency Use Authorization (EUA). This EUA will remain  in effect (meaning this test can be used) for the duration of the COVID-19 declaration under Section 564(b)(1) of the Act, 21 U.S.C. section 360bbb-3(b)(1), unless the authorization is terminated or revoked sooner.    Influenza A by PCR NEGATIVE NEGATIVE Final   Influenza B by PCR NEGATIVE NEGATIVE Final    Comment: (NOTE) The Xpert Xpress SARS-CoV-2/FLU/RSV assay is intended as an aid in  the diagnosis of influenza from Nasopharyngeal swab specimens and  should not be used as a sole basis for treatment. Nasal washings and  aspirates are unacceptable for Xpert Xpress SARS-CoV-2/FLU/RSV  testing. Fact Sheet for Patients: PinkCheek.be Fact Sheet for Healthcare Providers: GravelBags.it This test is not yet approved or cleared by the [place] FDA and  has been authorized for detection and/or diagnosis of SARS-CoV-2 by  FDA under an Emergency Use Authorization (EUA). This EUA will remain  in effect (meaning this test can be used) for the duration of the  Covid-19 declaration under Section  564(b)(1) of the Act, 21  U.S.C. section 360bbb-3(b)(1), unless the authorization is  terminated or revoked. Performed at [facility], [address], [place], [place] [address]        Today   [place] today has no new complaints --- Patient grumpy about being woken up early today by a provider, she  does not remember the name of the provider          Patient has been seen and examined prior to discharge   Objective   Blood pressure (!) 157/83, pulse 89, temperature 98.6 F (37 C), temperature source Oral, resp. rate 18, height 4\' 11"  (1.499 m), weight 78.2 kg, SpO2 100 %.   Intake/Output Summary (Last 24 hours) at 07/25/2019 1359 Last data filed at 07/25/2019 0300 Gross per 24 hour  Intake 1845.46 ml  Output --  Net 1845.46 ml    Exam Gen:- Awake Alert, no acute distress  HEENT:- [place].AT, No sclera icterus Neck-Supple Neck,No JVD,.  Lungs-  CTAB , good air movement bilaterally  CV- S1, S2 normal, regular Abd-  +ve B.Sounds, Abd Soft, No tenderness,    Extremity/Skin:- No  edema,   good pulses Psych-affect is appropriate, oriented x3 Neuro-no new focal deficits, no tremors    Data Review   CBC w Diff:  Lab Results  Component Value Date   WBC 3.8 (L) 07/24/2019   HGB 7.7 (L) 07/24/2019   HGB 8.3 (L) 01/06/2014   HCT 24.9 (L) 07/24/2019   HCT 29.1 (L) 01/06/2014   PLT 157 07/24/2019   PLT 272 01/06/2014   LYMPHOPCT 28 07/22/2019   LYMPHOPCT 25.5 01/06/2014   MONOPCT 13 07/22/2019   MONOPCT 11.0 01/06/2014   EOSPCT 0 07/22/2019   EOSPCT 8.6 (H) 01/06/2014   BASOPCT 1 07/22/2019   BASOPCT 0.5 01/06/2014    CMP:  Lab Results  Component Value Date   NA 142 07/25/2019   NA 145 (H) 12/27/2017   NA 141 01/06/2014   K 3.9 07/25/2019   K 4.1 01/06/2014   CL 108 07/25/2019   CO2 21 (L) 07/25/2019   CO2 19 (L) 01/06/2014   BUN 60 (H) 07/25/2019   BUN 44 (H) 12/27/2017   BUN 30.8 (H) 01/06/2014   CREATININE 4.95 (H) 07/25/2019   CREATININE 2.0 (H) 01/06/2014   PROT 6.6 07/19/2019   PROT 6.6 01/06/2014   ALBUMIN 2.9 (L) 07/25/2019   ALBUMIN 3.5 01/06/2014   BILITOT 0.8 07/19/2019   BILITOT 0.65 01/06/2014   ALKPHOS 122 07/19/2019   ALKPHOS 209 (H) 01/06/2014   AST 29 07/19/2019   AST 67 (H) 01/06/2014   ALT 15 07/19/2019   ALT 41 01/06/2014  .   Total  Discharge time is about 33 minutes  Roxan Hockey M.D on 07/25/2019 at 1:59 PM  Go to [email or web address] -  for contact info  [facility] - Office  [phone]

## 2019-07-25 NOTE — Discharge Instructions (Signed)
1)Avoid ibuprofen/Advil/Aleve/Motrin/Goody Powders/Naproxen/BC powders/Meloxicam/Diclofenac/Indomethacin and other Nonsteroidal anti-inflammatory medications as these will make you more likely to bleed and can cause stomach ulcers, can also cause Kidney problems.   2)Stop Tramadol as it can increase Seizure risks  3)Please follow-up with Neurologist Dr. Phillips Odor-- Phone: [phone], Address: [address], [place], [place] [address] in 4 to 6 weeks for recheck and reevaluation.  Please call to make appointment with him  4)Outpatient follow-up with nephrologist/kidney specialist in a couple weeks for repeat BMP and CBC test and reevaluation advised

## 2019-07-26 ENCOUNTER — Other Ambulatory Visit: Payer: Self-pay

## 2019-07-30 ENCOUNTER — Other Ambulatory Visit: Payer: Self-pay

## 2019-07-30 ENCOUNTER — Encounter: Payer: Self-pay | Admitting: Neurology

## 2019-07-30 ENCOUNTER — Telehealth (INDEPENDENT_AMBULATORY_CARE_PROVIDER_SITE_OTHER): Payer: Medicare Other | Admitting: Neurology

## 2019-07-30 VITALS — Ht 59.0 in | Wt 172.0 lb

## 2019-07-30 DIAGNOSIS — F329 Major depressive disorder, single episode, unspecified: Secondary | ICD-10-CM | POA: Diagnosis not present

## 2019-07-30 DIAGNOSIS — G40009 Localization-related (focal) (partial) idiopathic epilepsy and epileptic syndromes with seizures of localized onset, not intractable, without status epilepticus: Secondary | ICD-10-CM | POA: Diagnosis not present

## 2019-07-30 DIAGNOSIS — F32A Depression, unspecified: Secondary | ICD-10-CM
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
## 2019-08-01 ENCOUNTER — Telehealth: Payer: Self-pay | Admitting: Neurology

## 2019-08-01 NOTE — Telephone Encounter (Signed)
Patient wants to go to [facility] to get blood work done. She was not sure if she would receive the orders in the mail or could we faxed them to [facility].   She also wants to check on the referral to another provider that Dr Delice Lesch was doing for her   Please call

## 2019-08-02 ENCOUNTER — Telehealth: Payer: Self-pay | Admitting: Neurology

## 2019-08-02 ENCOUNTER — Other Ambulatory Visit: Payer: Self-pay

## 2019-08-02 DIAGNOSIS — R55 Syncope and collapse: Secondary | ICD-10-CM

## 2019-08-02 DIAGNOSIS — F32A Depression, unspecified: Secondary | ICD-10-CM

## 2019-08-02 DIAGNOSIS — G40009 Localization-related (focal) (partial) idiopathic epilepsy and epileptic syndromes with seizures of localized onset, not intractable, without status epilepticus: Secondary | ICD-10-CM

## 2019-08-02 DIAGNOSIS — R569 Unspecified convulsions: Secondary | ICD-10-CM

## 2019-08-02 MED ORDER — LEVETIRACETAM ER 500 MG PO TB24: 500.0000 mg | ORAL_TABLET | Freq: Every day | ORAL | 3 refills | Status: AC

## 2019-08-02 NOTE — Telephone Encounter (Signed)
Orders placed lab slip put in the mail

## 2019-08-02 NOTE — Telephone Encounter (Signed)
Patient is supposed to have blood work done for Dr Delice Lesch, she wants know if she can have it done at [facility] when she has her other blood work done on Monday by them.  Please call

## 2019-08-02 NOTE — Telephone Encounter (Signed)
Pt called and informed that she can get her labs done when she has other other lab work done on Monday, we did mail out the lab slip. Pt is going to a [facility] they will be able to see the order

## 2019-08-02 NOTE — Telephone Encounter (Signed)
Pls send her lab slip for Keppra level. She said she was going to have bloodwork with her kidney doctor, she can bring lab slip so they can add on to their labs. Pls send referral to [facility] for psychotherapy for depression. Thanks!

## 2019-08-23 ENCOUNTER — Encounter (HOSPITAL_COMMUNITY)
Admission: RE | Admit: 2019-08-23 | Discharge: 2019-08-23 | Disposition: A | Discharge status: Home / Self Care | Payer: Medicare Other | Source: Ambulatory Visit | Attending: Nephrology | Admitting: Nephrology

## 2019-08-23 ENCOUNTER — Encounter (HOSPITAL_COMMUNITY): Payer: Self-pay

## 2019-08-23 ENCOUNTER — Other Ambulatory Visit: Payer: Self-pay

## 2019-08-23 DIAGNOSIS — D631 Anemia in chronic kidney disease: Secondary | ICD-10-CM | POA: Diagnosis not present

## 2019-08-23 DIAGNOSIS — N185 Chronic kidney disease, stage 5: Secondary | ICD-10-CM | POA: Diagnosis present

## 2019-08-23 HISTORY — DX: Gastro-esophageal reflux disease without esophagitis: K21.9

## 2019-08-23 LAB — IRON AND TIBC
Iron: 93 ug/dL (ref 28–170)
Saturation Ratios: 34 % — ABNORMAL HIGH (ref 10.4–31.8)
TIBC: 272 ug/dL (ref 250–450)
UIBC: 179 ug/dL

## 2019-08-23 LAB — FERRITIN: Ferritin: 57 ng/mL (ref 11–307)

## 2019-08-23 LAB — POCT HEMOGLOBIN-HEMACUE: Hemoglobin: 9.7 g/dL — ABNORMAL LOW (ref 12.0–15.0)

## 2019-08-23 MED ORDER — EPOETIN ALFA-EPBX 10000 UNIT/ML IJ SOLN
20000.0000 [IU] | Freq: Once | INTRAMUSCULAR | Status: AC
  Administered 2019-08-23: 20000 [IU] via SUBCUTANEOUS
  Filled 2019-08-23: qty 2

## 2019-09-20 ENCOUNTER — Encounter (HOSPITAL_COMMUNITY)
Admission: RE | Admit: 2019-09-20 | Discharge: 2019-09-20 | Disposition: A | Discharge status: Home / Self Care | Payer: Medicare Other | Source: Ambulatory Visit | Attending: Nephrology | Admitting: Nephrology

## 2019-09-20 ENCOUNTER — Other Ambulatory Visit: Payer: Self-pay

## 2019-09-20 ENCOUNTER — Encounter (HOSPITAL_COMMUNITY): Payer: Self-pay

## 2019-09-20 DIAGNOSIS — N185 Chronic kidney disease, stage 5: Secondary | ICD-10-CM | POA: Diagnosis not present

## 2019-09-20 DIAGNOSIS — D631 Anemia in chronic kidney disease: Secondary | ICD-10-CM | POA: Insufficient documentation

## 2019-09-20 LAB — POCT HEMOGLOBIN-HEMACUE: Hemoglobin: 10.2 g/dL — ABNORMAL LOW (ref 12.0–15.0)

## 2019-09-20 MED ORDER — EPOETIN ALFA-EPBX 10000 UNIT/ML IJ SOLN
INTRAMUSCULAR | Status: AC
  Filled 2019-09-20: qty 2

## 2019-09-20 MED ORDER — EPOETIN ALFA-EPBX 10000 UNIT/ML IJ SOLN
20000.0000 [IU] | Freq: Once | INTRAMUSCULAR | Status: AC
  Administered 2019-09-20: 20000 [IU] via SUBCUTANEOUS

## 2019-09-24 ENCOUNTER — Encounter (HOSPITAL_COMMUNITY)
Admission: RE | Admit: 2019-09-24 | Discharge: 2019-09-24 | Disposition: A | Discharge status: Home / Self Care | Payer: Medicare Other | Source: Ambulatory Visit | Attending: Nephrology | Admitting: Nephrology

## 2019-09-24 ENCOUNTER — Other Ambulatory Visit: Payer: Self-pay

## 2019-09-24 DIAGNOSIS — N185 Chronic kidney disease, stage 5: Secondary | ICD-10-CM | POA: Diagnosis not present

## 2019-09-24 LAB — IRON AND TIBC
Iron: 66 ug/dL (ref 28–170)
Saturation Ratios: 29 % (ref 10.4–31.8)
TIBC: 225 ug/dL — ABNORMAL LOW (ref 250–450)
UIBC: 159 ug/dL

## 2019-09-24 LAB — FERRITIN: Ferritin: 59 ng/mL (ref 11–307)

## 2019-09-25 ENCOUNTER — Other Ambulatory Visit: Payer: Self-pay | Admitting: Orthopaedic Surgery

## 2019-10-18 ENCOUNTER — Encounter (HOSPITAL_COMMUNITY): Payer: Self-pay

## 2019-10-18 ENCOUNTER — Other Ambulatory Visit: Payer: Self-pay

## 2019-10-18 ENCOUNTER — Encounter (HOSPITAL_COMMUNITY)
Admission: RE | Admit: 2019-10-18 | Discharge: 2019-10-18 | Disposition: A | Discharge status: Home / Self Care | Payer: Medicare Other | Source: Ambulatory Visit | Attending: Nephrology | Admitting: Nephrology

## 2019-10-18 DIAGNOSIS — N185 Chronic kidney disease, stage 5: Secondary | ICD-10-CM | POA: Diagnosis not present

## 2019-10-18 LAB — IRON AND TIBC
Iron: 96 ug/dL (ref 28–170)
Saturation Ratios: 40 % — ABNORMAL HIGH (ref 10.4–31.8)
TIBC: 237 ug/dL — ABNORMAL LOW (ref 250–450)
UIBC: 141 ug/dL

## 2019-10-18 LAB — POCT HEMOGLOBIN-HEMACUE: Hemoglobin: 9.3 g/dL — ABNORMAL LOW (ref 12.0–15.0)

## 2019-10-18 LAB — FERRITIN: Ferritin: 69 ng/mL (ref 11–307)

## 2019-10-18 MED ORDER — EPOETIN ALFA-EPBX 10000 UNIT/ML IJ SOLN
20000.0000 [IU] | Freq: Once | INTRAMUSCULAR | Status: AC
  Administered 2019-10-18: 20000 [IU] via SUBCUTANEOUS

## 2019-10-18 MED ORDER — EPOETIN ALFA-EPBX 10000 UNIT/ML IJ SOLN
INTRAMUSCULAR | Status: AC
  Filled 2019-10-18: qty 2

## 2019-10-29 ENCOUNTER — Other Ambulatory Visit: Payer: Self-pay | Admitting: Orthopaedic Surgery

## 2019-10-30 ENCOUNTER — Other Ambulatory Visit: Payer: Self-pay | Admitting: Internal Medicine

## 2019-11-01 ENCOUNTER — Telehealth: Payer: Self-pay | Admitting: Internal Medicine

## 2019-11-01 NOTE — Telephone Encounter (Signed)
New message      *STAT* If patient is at the pharmacy, call can be transferred to refill team.   1. Which medications need to be refilled? (please list name of each medication and dose if known)  Venlafaxine 150mg   2. Which pharmacy/location (including street and city if local pharmacy) is medication to be sent to? [facility] 3. Do they need a 30 day or 90 day supply? 30 day -------patient has been out of medication for 3-4 days

## 2019-11-07 ENCOUNTER — Other Ambulatory Visit: Payer: Self-pay | Admitting: Orthopaedic Surgery

## 2019-11-15 ENCOUNTER — Encounter (HOSPITAL_COMMUNITY): Admission: RE | Admit: 2019-11-15 | Payer: Medicare Other | Source: Ambulatory Visit

## 2019-11-15 ENCOUNTER — Encounter (HOSPITAL_COMMUNITY): Payer: Medicare Other

## 2019-11-22 ENCOUNTER — Encounter (HOSPITAL_COMMUNITY)
Admission: RE | Admit: 2019-11-22 | Discharge: 2019-11-22 | Disposition: A | Discharge status: Home / Self Care | Payer: Medicare Other | Source: Ambulatory Visit | Attending: Nephrology | Admitting: Nephrology

## 2019-11-22 ENCOUNTER — Encounter (HOSPITAL_COMMUNITY)
Admission: RE | Admit: 2019-11-22 | Discharge: 2019-11-22 | Disposition: A | Discharge status: Home / Self Care | Payer: Medicare Other | Attending: Nephrology | Admitting: Nephrology

## 2019-11-22 ENCOUNTER — Encounter (HOSPITAL_COMMUNITY): Payer: Self-pay

## 2019-11-22 ENCOUNTER — Other Ambulatory Visit: Payer: Self-pay

## 2019-11-22 DIAGNOSIS — D631 Anemia in chronic kidney disease: Secondary | ICD-10-CM | POA: Insufficient documentation

## 2019-11-22 DIAGNOSIS — N185 Chronic kidney disease, stage 5: Secondary | ICD-10-CM | POA: Diagnosis not present

## 2019-11-22 LAB — IRON AND TIBC
Iron: 68 ug/dL (ref 28–170)
Saturation Ratios: 25 % (ref 10.4–31.8)
TIBC: 271 ug/dL (ref 250–450)
UIBC: 203 ug/dL

## 2019-11-22 LAB — FERRITIN: Ferritin: 49 ng/mL (ref 11–307)

## 2019-11-22 LAB — POCT HEMOGLOBIN-HEMACUE: Hemoglobin: 7.5 g/dL — ABNORMAL LOW (ref 12.0–15.0)

## 2019-11-22 MED ORDER — EPOETIN ALFA-EPBX 10000 UNIT/ML IJ SOLN
20000.0000 [IU] | Freq: Once | INTRAMUSCULAR | Status: AC
  Administered 2019-11-22: 20000 [IU] via SUBCUTANEOUS

## 2019-11-22 MED ORDER — EPOETIN ALFA-EPBX 10000 UNIT/ML IJ SOLN
INTRAMUSCULAR | Status: AC
  Filled 2019-11-22: qty 2

## 2019-12-07 ENCOUNTER — Encounter (HOSPITAL_COMMUNITY): Payer: Self-pay | Admitting: Emergency Medicine

## 2019-12-07 ENCOUNTER — Other Ambulatory Visit: Payer: Self-pay

## 2019-12-07 ENCOUNTER — Emergency Department (HOSPITAL_COMMUNITY): Payer: Medicare Other

## 2019-12-07 ENCOUNTER — Inpatient Hospital Stay (HOSPITAL_COMMUNITY)
Admission: EM | Admit: 2019-12-07 | Discharge: 2019-12-13 | DRG: 673 | Disposition: A | Discharge status: Home Health | Payer: Medicare Other | Attending: Internal Medicine | Admitting: Internal Medicine

## 2019-12-07 DIAGNOSIS — E8770 Fluid overload, unspecified: Secondary | ICD-10-CM | POA: Diagnosis present

## 2019-12-07 DIAGNOSIS — G43009 Migraine without aura, not intractable, without status migrainosus: Secondary | ICD-10-CM

## 2019-12-07 DIAGNOSIS — F419 Anxiety disorder, unspecified: Secondary | ICD-10-CM | POA: Diagnosis present

## 2019-12-07 DIAGNOSIS — F329 Major depressive disorder, single episode, unspecified: Secondary | ICD-10-CM | POA: Diagnosis not present

## 2019-12-07 DIAGNOSIS — Z841 Family history of disorders of kidney and ureter: Secondary | ICD-10-CM

## 2019-12-07 DIAGNOSIS — Z992 Dependence on renal dialysis: Secondary | ICD-10-CM

## 2019-12-07 DIAGNOSIS — Z20822 Contact with and (suspected) exposure to covid-19: Secondary | ICD-10-CM | POA: Diagnosis present

## 2019-12-07 DIAGNOSIS — N2581 Secondary hyperparathyroidism of renal origin: Secondary | ICD-10-CM | POA: Diagnosis present

## 2019-12-07 DIAGNOSIS — Z515 Encounter for palliative care: Secondary | ICD-10-CM | POA: Diagnosis not present

## 2019-12-07 DIAGNOSIS — E872 Acidosis: Secondary | ICD-10-CM | POA: Diagnosis present

## 2019-12-07 DIAGNOSIS — I169 Hypertensive crisis, unspecified: Secondary | ICD-10-CM | POA: Diagnosis present

## 2019-12-07 DIAGNOSIS — R432 Parageusia: Secondary | ICD-10-CM | POA: Diagnosis present

## 2019-12-07 DIAGNOSIS — I1311 Hypertensive heart and chronic kidney disease without heart failure, with stage 5 chronic kidney disease, or end stage renal disease: Secondary | ICD-10-CM | POA: Diagnosis present

## 2019-12-07 DIAGNOSIS — Z8249 Family history of ischemic heart disease and other diseases of the circulatory system: Secondary | ICD-10-CM

## 2019-12-07 DIAGNOSIS — I160 Hypertensive urgency: Secondary | ICD-10-CM | POA: Diagnosis present

## 2019-12-07 DIAGNOSIS — F319 Bipolar disorder, unspecified: Secondary | ICD-10-CM | POA: Diagnosis present

## 2019-12-07 DIAGNOSIS — N179 Acute kidney failure, unspecified: Secondary | ICD-10-CM

## 2019-12-07 DIAGNOSIS — Z87891 Personal history of nicotine dependence: Secondary | ICD-10-CM | POA: Diagnosis not present

## 2019-12-07 DIAGNOSIS — K219 Gastro-esophageal reflux disease without esophagitis: Secondary | ICD-10-CM | POA: Diagnosis not present

## 2019-12-07 DIAGNOSIS — Z79899 Other long term (current) drug therapy: Secondary | ICD-10-CM | POA: Diagnosis not present

## 2019-12-07 DIAGNOSIS — Z66 Do not resuscitate: Secondary | ICD-10-CM | POA: Diagnosis present

## 2019-12-07 DIAGNOSIS — Z23 Encounter for immunization: Secondary | ICD-10-CM | POA: Diagnosis present

## 2019-12-07 DIAGNOSIS — D631 Anemia in chronic kidney disease: Secondary | ICD-10-CM | POA: Diagnosis present

## 2019-12-07 DIAGNOSIS — Z833 Family history of diabetes mellitus: Secondary | ICD-10-CM | POA: Diagnosis not present

## 2019-12-07 DIAGNOSIS — N186 End stage renal disease: Secondary | ICD-10-CM | POA: Diagnosis present

## 2019-12-07 DIAGNOSIS — N189 Chronic kidney disease, unspecified: Secondary | ICD-10-CM

## 2019-12-07 DIAGNOSIS — G4459 Other complicated headache syndrome: Secondary | ICD-10-CM | POA: Diagnosis not present

## 2019-12-07 DIAGNOSIS — Z7982 Long term (current) use of aspirin: Secondary | ICD-10-CM | POA: Diagnosis not present

## 2019-12-07 DIAGNOSIS — G40909 Epilepsy, unspecified, not intractable, without status epilepticus: Secondary | ICD-10-CM | POA: Diagnosis present

## 2019-12-07 DIAGNOSIS — N185 Chronic kidney disease, stage 5: Secondary | ICD-10-CM | POA: Diagnosis not present

## 2019-12-07 DIAGNOSIS — Z7189 Other specified counseling: Secondary | ICD-10-CM | POA: Diagnosis not present

## 2019-12-07 LAB — COMPREHENSIVE METABOLIC PANEL WITH GFR
ALT: 13 U/L (ref 0–44)
AST: 13 U/L — ABNORMAL LOW (ref 15–41)
Albumin: 3.8 g/dL (ref 3.5–5.0)
Alkaline Phosphatase: 76 U/L (ref 38–126)
Anion gap: 11 (ref 5–15)
BUN: 76 mg/dL — ABNORMAL HIGH (ref 6–20)
CO2: 17 mmol/L — ABNORMAL LOW (ref 22–32)
Calcium: 8.3 mg/dL — ABNORMAL LOW (ref 8.9–10.3)
Chloride: 109 mmol/L (ref 98–111)
Creatinine, Ser: 7.6 mg/dL — ABNORMAL HIGH (ref 0.44–1.00)
GFR calc Af Amer: 6 mL/min — ABNORMAL LOW
GFR calc non Af Amer: 6 mL/min — ABNORMAL LOW
Glucose, Bld: 103 mg/dL — ABNORMAL HIGH (ref 70–99)
Potassium: 4.4 mmol/L (ref 3.5–5.1)
Sodium: 137 mmol/L (ref 135–145)
Total Bilirubin: 0.6 mg/dL (ref 0.3–1.2)
Total Protein: 7.1 g/dL (ref 6.5–8.1)

## 2019-12-07 LAB — SARS CORONAVIRUS 2 BY RT PCR (HOSPITAL ORDER, PERFORMED IN ~~LOC~~ HOSPITAL LAB): SARS Coronavirus 2: NEGATIVE

## 2019-12-07 LAB — PHOSPHORUS: Phosphorus: 7.2 mg/dL — ABNORMAL HIGH (ref 2.5–4.6)

## 2019-12-07 LAB — CBC WITH DIFFERENTIAL/PLATELET
Abs Immature Granulocytes: 0.01 K/uL (ref 0.00–0.07)
Basophils Absolute: 0 K/uL (ref 0.0–0.1)
Basophils Relative: 0 %
Eosinophils Absolute: 0 K/uL (ref 0.0–0.5)
Eosinophils Relative: 0 %
HCT: 28.7 % — ABNORMAL LOW (ref 36.0–46.0)
Hemoglobin: 8.8 g/dL — ABNORMAL LOW (ref 12.0–15.0)
Immature Granulocytes: 0 %
Lymphocytes Relative: 20 %
Lymphs Abs: 0.9 K/uL (ref 0.7–4.0)
MCH: 28.8 pg (ref 26.0–34.0)
MCHC: 30.7 g/dL (ref 30.0–36.0)
MCV: 93.8 fL (ref 80.0–100.0)
Monocytes Absolute: 0.4 K/uL (ref 0.1–1.0)
Monocytes Relative: 9 %
Neutro Abs: 3.2 K/uL (ref 1.7–7.7)
Neutrophils Relative %: 71 %
Platelets: 209 K/uL (ref 150–400)
RBC: 3.06 MIL/uL — ABNORMAL LOW (ref 3.87–5.11)
RDW: 17.9 % — ABNORMAL HIGH (ref 11.5–15.5)
WBC: 4.5 K/uL (ref 4.0–10.5)
nRBC: 0 % (ref 0.0–0.2)

## 2019-12-07 LAB — TROPONIN I (HIGH SENSITIVITY)
Troponin I (High Sensitivity): 39 ng/L — ABNORMAL HIGH (ref <18)
Troponin I (High Sensitivity): 35 ng/L — ABNORMAL HIGH (ref <18)

## 2019-12-07 LAB — MAGNESIUM: Magnesium: 1.9 mg/dL (ref 1.7–2.4)

## 2019-12-07 LAB — BRAIN NATRIURETIC PEPTIDE: B Natriuretic Peptide: 2573 pg/mL — ABNORMAL HIGH (ref 0.0–100.0)

## 2019-12-07 LAB — MRSA PCR SCREENING: MRSA by PCR: NEGATIVE

## 2019-12-07 MED ORDER — CHLORHEXIDINE GLUCONATE CLOTH 2 % EX PADS
6.0000 | MEDICATED_PAD | Freq: Every day | CUTANEOUS | Status: DC
  Administered 2019-12-07 – 2019-12-12 (×5): 6 via TOPICAL

## 2019-12-07 MED ORDER — HYDROMORPHONE HCL 1 MG/ML IJ SOLN
1.0000 mg | INTRAMUSCULAR | Status: DC | PRN
  Administered 2019-12-07 – 2019-12-13 (×20): 1 mg via INTRAVENOUS
  Filled 2019-12-07 (×20): qty 1

## 2019-12-07 MED ORDER — SODIUM CHLORIDE 0.9 % IV SOLN: 250.0000 mL | INTRAVENOUS | Status: DC | PRN

## 2019-12-07 MED ORDER — PNEUMOCOCCAL VAC POLYVALENT 25 MCG/0.5ML IJ INJ
0.5000 mL | INJECTION | INTRAMUSCULAR | Status: AC
  Administered 2019-12-09: 0.5 mL via INTRAMUSCULAR
  Filled 2019-12-07: qty 0.5

## 2019-12-07 MED ORDER — LABETALOL HCL 200 MG PO TABS
200.0000 mg | ORAL_TABLET | Freq: Two times a day (BID) | ORAL | Status: DC
  Administered 2019-12-07 – 2019-12-10 (×7): 200 mg via ORAL
  Filled 2019-12-07 (×7): qty 1

## 2019-12-07 MED ORDER — TIZANIDINE HCL 4 MG PO TABS: 4.0000 mg | ORAL_TABLET | Freq: Two times a day (BID) | ORAL | Status: DC | PRN

## 2019-12-07 MED ORDER — CALCITRIOL 0.25 MCG PO CAPS
1.0000 ug | ORAL_CAPSULE | Freq: Every day | ORAL | Status: DC
  Administered 2019-12-07 – 2019-12-12 (×5): 1 ug via ORAL
  Filled 2019-12-07 (×4): qty 4
  Filled 2019-12-07: qty 2
  Filled 2019-12-07: qty 4
  Filled 2019-12-07 (×2): qty 2

## 2019-12-07 MED ORDER — ACETAMINOPHEN 325 MG PO TABS
650.0000 mg | ORAL_TABLET | Freq: Three times a day (TID) | ORAL | Status: DC | PRN
  Administered 2019-12-07 – 2019-12-12 (×3): 650 mg via ORAL
  Filled 2019-12-07 (×3): qty 2

## 2019-12-07 MED ORDER — CALCIUM CARBONATE 1250 (500 CA) MG PO TABS
2.0000 | ORAL_TABLET | Freq: Two times a day (BID) | ORAL | Status: DC
  Administered 2019-12-07 – 2019-12-12 (×10): 1000 mg via ORAL
  Filled 2019-12-07 (×2): qty 2
  Filled 2019-12-07 (×3): qty 1
  Filled 2019-12-07: qty 2
  Filled 2019-12-07 (×4): qty 1
  Filled 2019-12-07: qty 2
  Filled 2019-12-07 (×2): qty 1
  Filled 2019-12-07: qty 2
  Filled 2019-12-07 (×3): qty 1

## 2019-12-07 MED ORDER — METOCLOPRAMIDE HCL 5 MG/ML IJ SOLN
10.0000 mg | Freq: Once | INTRAMUSCULAR | Status: AC
  Administered 2019-12-07: 10 mg via INTRAVENOUS
  Filled 2019-12-07: qty 2

## 2019-12-07 MED ORDER — VENLAFAXINE HCL ER 75 MG PO CP24
150.0000 mg | ORAL_CAPSULE | Freq: Every day | ORAL | Status: DC
  Administered 2019-12-08 – 2019-12-11 (×3): 150 mg via ORAL
  Filled 2019-12-07 (×5): qty 2

## 2019-12-07 MED ORDER — LABETALOL HCL 5 MG/ML IV SOLN: 10.0000 mg | INTRAVENOUS | Status: DC | PRN

## 2019-12-07 MED ORDER — HYDROXYZINE HCL 25 MG PO TABS
25.0000 mg | ORAL_TABLET | Freq: Two times a day (BID) | ORAL | Status: DC
  Administered 2019-12-07 – 2019-12-12 (×11): 25 mg via ORAL
  Filled 2019-12-07 (×13): qty 1

## 2019-12-07 MED ORDER — LEVETIRACETAM ER 500 MG PO TB24
500.0000 mg | ORAL_TABLET | Freq: Every day | ORAL | Status: DC
  Administered 2019-12-07 – 2019-12-11 (×4): 500 mg via ORAL
  Filled 2019-12-07 (×10): qty 1

## 2019-12-07 MED ORDER — TEMAZEPAM 15 MG PO CAPS
15.0000 mg | ORAL_CAPSULE | Freq: Every evening | ORAL | Status: DC | PRN
  Administered 2019-12-07 – 2019-12-12 (×4): 15 mg via ORAL
  Filled 2019-12-07 (×4): qty 2

## 2019-12-07 MED ORDER — METOLAZONE 5 MG PO TABS
5.0000 mg | ORAL_TABLET | Freq: Once | ORAL | Status: AC
  Administered 2019-12-07: 5 mg via ORAL
  Filled 2019-12-07: qty 1

## 2019-12-07 MED ORDER — CALCIUM CARBONATE ANTACID 500 MG PO CHEW
1.0000 | CHEWABLE_TABLET | Freq: Two times a day (BID) | ORAL | Status: DC | PRN
  Administered 2019-12-07: 200 mg via ORAL
  Filled 2019-12-07: qty 1

## 2019-12-07 MED ORDER — SODIUM CHLORIDE 0.9% FLUSH: 3.0000 mL | INTRAVENOUS | Status: DC | PRN

## 2019-12-07 MED ORDER — ASPIRIN EC 81 MG PO TBEC
81.0000 mg | DELAYED_RELEASE_TABLET | Freq: Every day | ORAL | Status: DC
  Administered 2019-12-07 – 2019-12-11 (×4): 81 mg via ORAL
  Filled 2019-12-07 (×6): qty 1

## 2019-12-07 MED ORDER — DIPHENHYDRAMINE HCL 50 MG/ML IJ SOLN
25.0000 mg | Freq: Once | INTRAMUSCULAR | Status: AC
  Administered 2019-12-07: 25 mg via INTRAVENOUS
  Filled 2019-12-07: qty 1

## 2019-12-07 MED ORDER — SEVELAMER CARBONATE 800 MG PO TABS
800.0000 mg | ORAL_TABLET | Freq: Three times a day (TID) | ORAL | Status: DC
  Administered 2019-12-07 – 2019-12-12 (×13): 800 mg via ORAL
  Filled 2019-12-07 (×14): qty 1

## 2019-12-07 MED ORDER — FUROSEMIDE 10 MG/ML IJ SOLN
160.0000 mg | Freq: Once | INTRAMUSCULAR | Status: DC
  Filled 2019-12-07: qty 16

## 2019-12-07 MED ORDER — PROCHLORPERAZINE EDISYLATE 10 MG/2ML IJ SOLN
10.0000 mg | Freq: Four times a day (QID) | INTRAMUSCULAR | Status: DC | PRN
  Administered 2019-12-07: 10 mg via INTRAVENOUS
  Filled 2019-12-07: qty 2

## 2019-12-07 MED ORDER — SODIUM CHLORIDE 0.9% FLUSH
3.0000 mL | Freq: Two times a day (BID) | INTRAVENOUS | Status: DC
  Administered 2019-12-07 – 2019-12-12 (×7): 3 mL via INTRAVENOUS

## 2019-12-07 MED ORDER — PANTOPRAZOLE SODIUM 40 MG PO TBEC
40.0000 mg | DELAYED_RELEASE_TABLET | Freq: Every day | ORAL | Status: DC
  Administered 2019-12-07 – 2019-12-12 (×5): 40 mg via ORAL
  Filled 2019-12-07 (×7): qty 1

## 2019-12-07 MED ORDER — ACETAMINOPHEN 325 MG PO TABS
650.0000 mg | ORAL_TABLET | Freq: Once | ORAL | Status: AC
  Administered 2019-12-07: 650 mg via ORAL
  Filled 2019-12-07: qty 2

## 2019-12-07 MED ORDER — PROMETHAZINE HCL 25 MG/ML IJ SOLN
12.5000 mg | Freq: Four times a day (QID) | INTRAMUSCULAR | Status: DC | PRN
  Administered 2019-12-08 – 2019-12-09 (×2): 12.5 mg via INTRAVENOUS
  Filled 2019-12-07 (×2): qty 1

## 2019-12-07 MED ORDER — SODIUM BICARBONATE 650 MG PO TABS
650.0000 mg | ORAL_TABLET | Freq: Three times a day (TID) | ORAL | Status: DC
  Administered 2019-12-07 (×3): 650 mg via ORAL
  Filled 2019-12-07 (×9): qty 1

## 2019-12-07 MED ORDER — HEPARIN SODIUM (PORCINE) 5000 UNIT/ML IJ SOLN
5000.0000 [IU] | Freq: Three times a day (TID) | INTRAMUSCULAR | Status: DC
  Administered 2019-12-07 – 2019-12-12 (×14): 5000 [IU] via SUBCUTANEOUS
  Filled 2019-12-07 (×14): qty 1

## 2019-12-07 MED ORDER — NITROGLYCERIN 2 % TD OINT
1.0000 [in_us] | TOPICAL_OINTMENT | Freq: Once | TRANSDERMAL | Status: AC
  Administered 2019-12-07: 1 [in_us] via TOPICAL
  Filled 2019-12-07: qty 1

## 2019-12-07 MED ORDER — LABETALOL HCL 5 MG/ML IV SOLN
10.0000 mg | INTRAVENOUS | Status: DC | PRN
  Administered 2019-12-07 (×4): 10 mg via INTRAVENOUS
  Filled 2019-12-07 (×2): qty 4

## 2019-12-07 MED ORDER — NICARDIPINE HCL IN NACL 20-0.86 MG/200ML-% IV SOLN
3.0000 mg/h | INTRAVENOUS | Status: DC
  Administered 2019-12-07 (×5): 5 mg/h via INTRAVENOUS
  Administered 2019-12-08: 3 mg/h via INTRAVENOUS
  Administered 2019-12-08: 5 mg/h via INTRAVENOUS
  Administered 2019-12-08: 7.5 mg/h via INTRAVENOUS
  Filled 2019-12-07 (×7): qty 200

## 2019-12-07 MED ORDER — INFLUENZA VAC SPLIT QUAD 0.5 ML IM SUSY
0.5000 mL | PREFILLED_SYRINGE | INTRAMUSCULAR | Status: AC
  Administered 2019-12-09: 0.5 mL via INTRAMUSCULAR
  Filled 2019-12-07: qty 0.5

## 2019-12-07 MED ORDER — FUROSEMIDE 10 MG/ML IJ SOLN
100.0000 mg | Freq: Once | INTRAMUSCULAR | Status: AC
  Administered 2019-12-07: 100 mg via INTRAVENOUS
  Filled 2019-12-07: qty 12

## 2019-12-07 MED ORDER — HYDRALAZINE HCL 25 MG PO TABS
50.0000 mg | ORAL_TABLET | Freq: Three times a day (TID) | ORAL | Status: DC
  Administered 2019-12-07 – 2019-12-08 (×3): 50 mg via ORAL
  Filled 2019-12-07 (×3): qty 2

## 2019-12-07 MED ORDER — HYDRALAZINE HCL 20 MG/ML IJ SOLN
10.0000 mg | Freq: Four times a day (QID) | INTRAMUSCULAR | Status: DC | PRN
  Administered 2019-12-09: 10 mg via INTRAVENOUS
  Filled 2019-12-07: qty 1

## 2019-12-07 MED ORDER — FUROSEMIDE 10 MG/ML IJ SOLN
160.0000 mg | Freq: Once | INTRAVENOUS | Status: AC
  Administered 2019-12-07: 160 mg via INTRAVENOUS
  Filled 2019-12-07: qty 16

## 2019-12-07 NOTE — ED Provider Notes (Signed)
[facility] EMERGENCY DEPARTMENT Provider Note   CSN: [id number] Arrival date & time: 12/07/19  0247   Time seen 3:25 AM  History Chief Complaint  Patient presents with  . Hypertension    [name] is a 54 y.o. female.  HPI   Patient states she has had a frontal headache for the past 3 days as throbbing and aching. She has some photophobia and "noise just annoys me". She has had nausea without vomiting. She denies any change in her vision. She states she is tried nothing for her headaches. She states she takes Tylenol arthritis for her regular aches and pains and it has not helped. She denies chest pain but states she has had shortness of breath off and on for the past 3 weeks. She has end-stage renal disease and states she "will not talk about dialysis". She denies any change of her diet and specifically denies eating anything out of a can. She called EMS tonight because of her headache. When they evaluated her she had a very high blood pressure of 200/140. Patient states her renal doctor started her on hydralazine 25 mg twice a day about two or 3 weeks ago and she is supposed to take a baby aspirin 81 mg daily.  PCP Pixie Casino, MD Renal Dr Hollie Salk with [facility]  Past Medical History:  Diagnosis Date  . Anemia   . Anxiety   . Bipolar disorder ([place])   . Depression   . GERD (gastroesophageal reflux disease)   . History of blood transfusion   . Hypertension   . IUD    HISTORY OF IUD --REMOVED IN 2006  . Seizures ([place]) [id number]   two seizures due to a med changes    Patient Active Problem List   Diagnosis Date Noted  . DNR (do not resuscitate)   . Palliative care by specialist   . Goals of care, counseling/discussion   . Hypocalcemia 07/21/2019  . Acute renal failure superimposed on stage 4 chronic kidney disease ([place]) 09/10/2018  . Hypertensive urgency, malignant   . Acute renal failure superimposed on chronic kidney disease ([place])   . Hypertensive  emergency 09/08/2018  . Acute renal injury ([place]) 09/08/2018  . Alkaline phosphatase elevation 03/29/2018  . B12 deficiency 03/29/2018  . Vitamin D deficiency 03/29/2018  . History of herpes genitalis 03/22/2018  . Obesity (BMI 30.0-34.9) 03/22/2018  . Osteoarthritis 03/22/2018  . Seizure ([place]) 07/06/2017  . Noncompliance with medication regimen 04/27/2017  . Vasovagal syncope 03/29/2017  . Rotator cuff arthropathy of left shoulder 07/07/2016  . S/P shoulder surgery 07/07/2016  . LGSIL on Pap smear of cervix 04/26/2016  . Herpes simplex vulvovaginitis 03/01/2016  . Lower extremity edema 07/20/2015  . Chronic kidney disease, stage IV (severe) ([place]) 05/14/2015  . Proteinuria 05/14/2015  . Generalized headaches 01/05/2015  . Weight gain following gastric bypass surgery 08/13/2014  . Deficiency anemia 01/06/2014  . Iron deficiency anemia 01/06/2014  . Vitamin B12 deficiency (dietary) anemia 01/06/2014  . Essential hypertension, benign 01/25/2013  . Bipolar disorder [facility])     Past Surgical History:  Procedure Laterality Date  . CARPAL TUNNEL RELEASE Right 07/08/2014   Procedure: RIGHT CARPAL TUNNEL RELEASE;  Surgeon: Sanjuana Kava, MD;  Location: [facility];  Service: Orthopedics;  Laterality: Right;  . CHOLECYSTECTOMY    . GASTRIC BYPASS  2000  . HYSTEROSCOPY WITH D & C  11/14/2011   Procedure: DILATATION AND CURETTAGE /HYSTEROSCOPY;  Surgeon: Marylynn Pearson, MD;  Location: [facility];  Service: Gynecology;;  with removal of polyps  . INTRAUTERINE DEVICE (IUD) INSERTION  03/21/2010  . LAPAROSCOPIC GASTROTOMY W/ REPAIR OF ULCER    . PANNICULECTOMY    . ROTATOR CUFF REPAIR       OB History    Gravida  1   Para      Term      Preterm      AB  1   Living  0     SAB  1   TAB      Ectopic      Multiple      Live Births              Family History  Problem Relation Age of Onset  . Hypertension Mother   . Kidney failure Mother   . Hypertension Father   . Diabetes  Cousin   . Diabetes Sister   . Hypertension Sister   . Kidney disease Sister        dialysis  . Hypertension Brother   . Heart attack Brother   . Hypertension Maternal Aunt   . Hypertension Maternal Uncle   . Hypertension Maternal Grandmother   . Hypertension Maternal Grandfather   . Hypertension Paternal Grandmother   . Hypertension Paternal Grandfather   . Heart attack Brother   . Hypertension Brother   . Hypertension Sister     Social History   Tobacco Use  . Smoking status: Former Smoker    Packs/day: 0.25    Years: 4.00    Pack years: 1.00    Types: Cigarettes    Quit date: 03/21/1988    Years since quitting: 31.7  . Smokeless tobacco: Never Used  Vaping Use  . Vaping Use: Never used  Substance Use Topics  . Alcohol use: Yes    Comment: 1-2 times a year.   . Drug use: No    Home Medications Prior to Admission medications   Medication Sig Start Date End Date Taking? Authorizing Provider  tiZANidine (ZANAFLEX) 4 MG tablet Take 1 tablet (4 mg total) by mouth 2 (two) times daily as needed. TAKE 1 TABLET BY MOUTH EVERY 12 HOURS AS NEEDED FOR SPASM 11/12/19 12/12/19  Carole Civil, MD  traMADol (ULTRAM) 50 MG tablet TAKE 1 TABLET BY MOUTH EVERY SIX HOURS AS NEEDED. 10/29/19   Sanjuana Kava, MD  acetaminophen (TYLENOL 8 HOUR) 650 MG CR tablet Take 650 mg by mouth every 8 (eight) hours as needed for pain.    [provider]  aspirin EC 81 MG tablet Take 1 tablet (81 mg total) by mouth daily with breakfast. 07/25/19   Denton Brick, Courage, MD  CALCITRIOL PO Take 1 mcg by mouth daily.  06/25/19   [provider]  calcium carbonate (OS-CAL - DOSED IN MG OF ELEMENTAL CALCIUM) 1250 (500 Ca) MG tablet Take 2 tablets (1,000 mg of elemental calcium total) by mouth 2 (two) times daily with a meal. 07/25/19   Emokpae, Courage, MD  carvedilol (COREG) 25 MG tablet Take 1 tablet (25 mg total) by mouth 2 (two) times daily with a meal. 07/25/19   Emokpae, Courage, MD  epoetin  alfa-epbx (RETACRIT) 93716 UNIT/ML injection 20,000 Units every 30 (thirty) days.    Madelon Lips, MD  furosemide (LASIX) 80 MG tablet Take 1 tablet (80 mg total) by mouth daily. 07/25/19   Roxan Hockey, MD  hydrOXYzine (ATARAX/VISTARIL) 25 MG tablet Take 1 tablet (25 mg total) by mouth 2 (two) times daily. 07/25/19   Emokpae,  Courage, MD  levETIRAcetam (KEPPRA XR) 500 MG 24 hr tablet Take 1 tablet (500 mg total) by mouth daily. 08/02/19   Cameron Sprang, MD  promethazine (PHENERGAN) 25 MG tablet Take 25 mg by mouth every 6 (six) hours as needed. 01/01/19   [provider]  sodium bicarbonate 650 MG tablet Take 1 tablet (650 mg total) by mouth 2 (two) times daily. 07/25/19   Roxan Hockey, MD  valACYclovir (VALTREX) 1000 MG tablet TAKE 1/2 TABLET BY MOUTH DAILY Patient not taking: No sig reported 10/22/18   Donnamae Jude, MD  venlafaxine XR (EFFEXOR-XR) 150 MG 24 hr capsule Take 150 mg by mouth daily with breakfast.    [provider]    Allergies    Ambien [zolpidem tartrate], Geodon [ziprasidone hcl], Lactulose, Ondansetron, Quetiapine, Sulfa antibiotics, and Zolpidem  Review of Systems   Review of Systems  All other systems reviewed and are negative.   Physical Exam Updated Vital Signs BP (!) 153/101   Pulse 89   Temp 98.3 F (36.8 C) (Oral)   Resp 18   Ht 4\' 11"  (1.499 m)   Wt 70.3 kg   SpO2 96%   BMI 31.31 kg/m   Physical Exam Vitals and nursing note reviewed.  Constitutional:      General: She is not in acute distress.    Appearance: Normal appearance. She is normal weight. She is not ill-appearing or toxic-appearing.     Comments: Patient ripped the blood pressure cuff off her arm when it was pumping up.  HENT:     Head: Normocephalic and atraumatic.     Nose: Nose normal.  Eyes:     Extraocular Movements: Extraocular movements intact.     Conjunctiva/sclera: Conjunctivae normal.     Pupils: Pupils are equal, round, and reactive to light.      Comments: Patient frequently flutters her eyes and rolls her eyes up so you can just see the whites of her eyes through her barely open eyelids.  Cardiovascular:     Rate and Rhythm: Normal rate and regular rhythm.     Pulses: Normal pulses.     Heart sounds: Normal heart sounds. No murmur heard.   Pulmonary:     Effort: Pulmonary effort is normal. No respiratory distress.     Breath sounds: Normal breath sounds. No stridor. No wheezing, rhonchi or rales.  Abdominal:     General: Abdomen is flat. Bowel sounds are normal.     Palpations: Abdomen is soft.     Tenderness: There is no abdominal tenderness.  Musculoskeletal:        General: No swelling or tenderness. Normal range of motion.     Cervical back: Normal range of motion.     Right lower leg: No edema.     Left lower leg: No edema.  Neurological:     General: No focal deficit present.     Mental Status: She is alert and oriented to person, place, and time.     Cranial Nerves: No cranial nerve deficit.  Psychiatric:        Mood and Affect: Mood normal.        Speech: Speech normal.        Behavior: Behavior is cooperative.     ED Results / Procedures / Treatments   Labs (all labs ordered are listed, but only abnormal results are displayed) Results for orders placed or performed during the hospital encounter of 12/07/19  Comprehensive metabolic panel  Result Value  Ref Range   Sodium 137 135 - 145 mmol/L   Potassium 4.4 3.5 - 5.1 mmol/L   Chloride 109 98 - 111 mmol/L   CO2 17 (L) 22 - 32 mmol/L   Glucose, Bld 103 (H) 70 - 99 mg/dL   BUN 76 (H) 6 - 20 mg/dL   Creatinine, Ser 7.60 (H) 0.44 - 1.00 mg/dL   Calcium 8.3 (L) 8.9 - 10.3 mg/dL   Total Protein 7.1 6.5 - 8.1 g/dL   Albumin 3.8 3.5 - 5.0 g/dL   AST 13 (L) 15 - 41 U/L   ALT 13 0 - 44 U/L   Alkaline Phosphatase 76 38 - 126 U/L   Total Bilirubin 0.6 0.3 - 1.2 mg/dL   GFR calc non Af Amer 6 (L) >60 mL/min   GFR calc Af Amer 6 (L) >60 mL/min   Anion gap 11 5 - 15   CBC with Differential  Result Value Ref Range   WBC 4.5 4.0 - 10.5 K/uL   RBC 3.06 (L) 3.87 - 5.11 MIL/uL   Hemoglobin 8.8 (L) 12.0 - 15.0 g/dL   HCT 28.7 (L) 36 - 46 %   MCV 93.8 80.0 - 100.0 fL   MCH 28.8 26.0 - 34.0 pg   MCHC 30.7 30.0 - 36.0 g/dL   RDW 17.9 (H) 11.5 - 15.5 %   Platelets 209 150 - 400 K/uL   nRBC 0.0 0.0 - 0.2 %   Neutrophils Relative % 71 %   Neutro Abs 3.2 1.7 - 7.7 K/uL   Lymphocytes Relative 20 %   Lymphs Abs 0.9 0.7 - 4.0 K/uL   Monocytes Relative 9 %   Monocytes Absolute 0.4 0 - 1 K/uL   Eosinophils Relative 0 %   Eosinophils Absolute 0.0 0 - 0 K/uL   Basophils Relative 0 %   Basophils Absolute 0.0 0 - 0 K/uL   Immature Granulocytes 0 %   Abs Immature Granulocytes 0.01 0.00 - 0.07 K/uL  Brain natriuretic peptide  Result Value Ref Range   B Natriuretic Peptide 2,573.0 (H) 0.0 - 100.0 pg/mL  Troponin I (High Sensitivity)  Result Value Ref Range   Troponin I (High Sensitivity) 39 (H) <18 ng/L  Troponin I (High Sensitivity)  Result Value Ref Range   Troponin I (High Sensitivity) 35 (H) <18 ng/L   Laboratory interpretation all normal except elevated troponins however delta troponin is negative, very elevated BNP, increased creatinine compared to last one several months ago which was 4.6.  Stable anemia   EKG EKG Interpretation  Date/Time:  Saturday December 07 2019 02:57:14 EDT Ventricular Rate:  95 PR Interval:    QRS Duration: 77 QT Interval:  380 QTC Calculation: 478 R Axis:   -9 Text Interpretation: Sinus rhythm Probable left atrial enlargement Consider anterior infarct Abnormal T, consider ischemia, lateral leads Confirmed by Rolland Porter [phone]) on 12/07/2019 3:26:24 AM   Radiology DG Chest 2 View  Result Date: 12/07/2019 CLINICAL DATA:  End stage renal disease with shortness of breath. EXAM: CHEST - 2 VIEW COMPARISON:  07/20/2011 FINDINGS: Mild cardiac enlargement. No pleural effusion. Pulmonary vascular congestion and mild interstitial  edema noted. No airspace opacities. Bilateral glenohumeral joint osteoarthritis. IMPRESSION: 1. Pulmonary vascular congestion with mild interstitial edema noted. Electronically Signed   By: Kerby Moors M.D.   On: 12/07/2019 04:17   CT Head Wo Contrast  Result Date: 12/07/2019 CLINICAL DATA:  Worsening headache EXAM: CT HEAD WITHOUT CONTRAST TECHNIQUE: Contiguous axial images were obtained from  the base of the skull through the vertex without intravenous contrast. COMPARISON:  07/19/2019 FINDINGS: Brain: There is no mass, hemorrhage or extra-axial collection. The size and configuration of the ventricles and extra-axial CSF spaces are normal. There is hypoattenuation of the white matter, most commonly indicating chronic small vessel disease. Vascular: No abnormal hyperdensity of the major intracranial arteries or dural venous sinuses. No intracranial atherosclerosis. Skull: The visualized skull base, calvarium and extracranial soft tissues are normal. Sinuses/Orbits: No fluid levels or advanced mucosal thickening of the visualized paranasal sinuses. No mastoid or middle ear effusion. The orbits are normal. IMPRESSION: Chronic small vessel disease, advanced for age, without acute intracranial abnormality. Electronically Signed   By: Ulyses Jarred M.D.   On: 12/07/2019 04:27    Procedures .Critical Care Performed by: Rolland Porter, MD Authorized by: Rolland Porter, MD   Critical care provider statement:    Critical care time (minutes):  41   Critical care was necessary to treat or prevent imminent or life-threatening deterioration of the following conditions:  CNS failure or compromise   Critical care was time spent personally by me on the following activities:  Discussions with consultants, examination of patient, obtaining history from patient or surrogate, ordering and review of laboratory studies, ordering and review of radiographic studies, pulse oximetry, re-evaluation of patient's condition and review  of old charts   (including critical care time)  Medications Ordered in ED Medications  labetalol (NORMODYNE) injection 10 mg (10 mg Intravenous Given 12/07/19 0514)  nicardipine (CARDENE) 20mg  in 0.86% saline 23ml IV infusion (0.1 mg/ml) (5 mg/hr Intravenous New Bag/Given 12/07/19 0608)  metoCLOPramide (REGLAN) injection 10 mg (10 mg Intravenous Given 12/07/19 0358)  diphenhydrAMINE (BENADRYL) injection 25 mg (25 mg Intravenous Given 12/07/19 0358)  furosemide (LASIX) injection 100 mg (100 mg Intravenous Given 12/07/19 0448)  nitroGLYCERIN (NITROGLYN) 2 % ointment 1 inch (1 inch Topical Given 12/07/19 0513)  acetaminophen (TYLENOL) tablet 650 mg (650 mg Oral Given 12/07/19 1324)    ED Course  I have reviewed the triage vital signs and the nursing notes.  Pertinent labs & imaging results that were available during my care of the patient were reviewed by me and considered in my medical decision making (see chart for details).    MDM Rules/Calculators/A&P                          Patient has hypertension and headache, concern was for intracranial event or even press syndrome.  She was started on migraine cocktail for her complaints of headache with Reglan and Benadryl and she was started on labetalol 10 mg IV as needed blood pressure over 160 every 10 minutes.  Recheck at 4:40 AM when I see on the monitor is blood pressure 203/132.  She states her headache is better.  She has had the labetalol twice.   After reviewing her chest x-ray she was given Lasix 100 mg IV and nitroglycerin paste 1 inch to chest with acetaminophen for anticipated nitroglycerin headache  6:00 AM nurse reports patient has had labetalol x5 doses.  Her blood pressure was still 184/117.  She was switched to nicardipine drip.  When I rechecked her around 6:25 AM her blood pressure was 153/101.  Patient states her last creatinine was 6 at her nephrologist office.  She also states she took the [employer] vaccine.  We discussed need  for admission to control her blood pressure and she is agreeable.  6:43 AM Dr Clearence Ped, hospitalist  will admit.  Final Clinical Impression(s) / ED Diagnoses Final diagnoses:  Hypertensive urgency  Migraine without aura and without status migrainosus, not intractable  Chronic kidney disease, unspecified CKD stage    Rx / DC Orders  Plan admission  Rolland Porter, MD, Barbette Or, MD 12/07/19 [id number]

## 2019-12-07 NOTE — ED Notes (Addendum)
Dr. Tomi Bamberger made aware of minimal change in BP with labetalol after 4 doses. EDP to re-evaluate

## 2019-12-07 NOTE — H&P (Signed)
History and Physical    [name] GYI:[id number] DOB: [birth year] DOA: 12/07/2019  PCP: Pixie Casino, MD   Patient coming from: Home  I have personally briefly reviewed patient's old medical records in [facility] [place]  Chief Complaint: Shortness of breath on exertion, headache, nausea  HPI: [name] is a 54 y.o. female with medical history significant of with a past medical history significant for anemia of chronic disease, seizure disorders, hypertension, gastroesophageal flux disease, bipolar disorder/depression and end-stage renal disease (refusing hemodialysis); who presented to the hospital secondary to headaches, nausea and shortness of breath on exertion.  Patient reports symptom has been present for the last 3-4 days and worsening.  She denies any fever, hematemesis, hematuria, melena, hematochezia, abdominal pain, blurred vision, focal weakness or any other complaints.  Reports to be compliant with her medications no recent changes to them.  No sick contacts.  ED Course: Patient found in hypertensive crisis, chest x-ray demonstrating vascular congestion and with worsening in her renal function and BUN.  Labetalol x5 doses given throughout her ED stay and subsequently due to failure improvement in her blood pressure was started on Cardene drip.  CT scan of the head demonstrated no acute intracranial abnormalities.  For her vascular congestion 100 mg of Lasix times once given.  TRH consulted to admit patient for further evaluation and management.  Review of Systems: As per HPI otherwise all other systems reviewed and are negative.   Past Medical History:  Diagnosis Date  . Anemia   . Anxiety   . Bipolar disorder ([place])   . Depression   . GERD (gastroesophageal reflux disease)   . History of blood transfusion   . Hypertension   . IUD    HISTORY OF IUD --REMOVED IN 2006  . Seizures ([place]) [id number]   two seizures due to a med changes    Past Surgical History:    Procedure Laterality Date  . CARPAL TUNNEL RELEASE Right 07/08/2014   Procedure: RIGHT CARPAL TUNNEL RELEASE;  Surgeon: Sanjuana Kava, MD;  Location: [facility];  Service: Orthopedics;  Laterality: Right;  . CHOLECYSTECTOMY    . GASTRIC BYPASS  2000  . HYSTEROSCOPY WITH D & C  11/14/2011   Procedure: DILATATION AND CURETTAGE /HYSTEROSCOPY;  Surgeon: Marylynn Pearson, MD;  Location: [facility];  Service: Gynecology;;  with removal of polyps  . INTRAUTERINE DEVICE (IUD) INSERTION  03/21/2010  . LAPAROSCOPIC GASTROTOMY W/ REPAIR OF ULCER    . PANNICULECTOMY    . ROTATOR CUFF REPAIR      Social History  reports that she quit smoking about 31 years ago. Her smoking use included cigarettes. She has a 1.00 pack-year smoking history. She has never used smokeless tobacco. She reports current alcohol use. She reports that she does not use drugs.  Allergies  Allergen Reactions  . Ambien [Zolpidem Tartrate]     "BLACK OUT", SLEEP DRIVING  . Geodon [Ziprasidone Hcl]     Seizure activity  . Lactulose Itching  . Ondansetron Nausea And Vomiting  . Quetiapine     Other reaction(s): Other (See Comments) Possible Hair Loss  . Sulfa Antibiotics Rash  . Zolpidem Rash    Family History  Problem Relation Age of Onset  . Hypertension Mother   . Kidney failure Mother   . Hypertension Father   . Diabetes Cousin   . Diabetes Sister   . Hypertension Sister   . Kidney disease Sister        dialysis  .  Hypertension Brother   . Heart attack Brother   . Hypertension Maternal Aunt   . Hypertension Maternal Uncle   . Hypertension Maternal Grandmother   . Hypertension Maternal Grandfather   . Hypertension Paternal Grandmother   . Hypertension Paternal Grandfather   . Heart attack Brother   . Hypertension Brother   . Hypertension Sister     Prior to Admission medications   Medication Sig Start Date End Date Taking? Authorizing Provider  tiZANidine (ZANAFLEX) 4 MG tablet Take 1 tablet (4 mg total) by mouth 2  (two) times daily as needed. TAKE 1 TABLET BY MOUTH EVERY 12 HOURS AS NEEDED FOR SPASM 11/12/19 12/12/19  Carole Civil, MD  traMADol (ULTRAM) 50 MG tablet TAKE 1 TABLET BY MOUTH EVERY SIX HOURS AS NEEDED. 10/29/19   Sanjuana Kava, MD  acetaminophen (TYLENOL 8 HOUR) 650 MG CR tablet Take 650 mg by mouth every 8 (eight) hours as needed for pain.    [provider]  aspirin EC 81 MG tablet Take 1 tablet (81 mg total) by mouth daily with breakfast. 07/25/19   Denton Brick, Courage, MD  CALCITRIOL PO Take 1 mcg by mouth daily.  06/25/19   [provider]  calcium carbonate (OS-CAL - DOSED IN MG OF ELEMENTAL CALCIUM) 1250 (500 Ca) MG tablet Take 2 tablets (1,000 mg of elemental calcium total) by mouth 2 (two) times daily with a meal. 07/25/19   Emokpae, Courage, MD  carvedilol (COREG) 25 MG tablet Take 1 tablet (25 mg total) by mouth 2 (two) times daily with a meal. 07/25/19   Emokpae, Courage, MD  epoetin alfa-epbx (RETACRIT) 93734 UNIT/ML injection 20,000 Units every 30 (thirty) days.    Madelon Lips, MD  furosemide (LASIX) 80 MG tablet Take 1 tablet (80 mg total) by mouth daily. 07/25/19   Roxan Hockey, MD  hydrOXYzine (ATARAX/VISTARIL) 25 MG tablet Take 1 tablet (25 mg total) by mouth 2 (two) times daily. 07/25/19   Roxan Hockey, MD  levETIRAcetam (KEPPRA XR) 500 MG 24 hr tablet Take 1 tablet (500 mg total) by mouth daily. 08/02/19   Cameron Sprang, MD  promethazine (PHENERGAN) 25 MG tablet Take 25 mg by mouth every 6 (six) hours as needed. 01/01/19   [provider]  sodium bicarbonate 650 MG tablet Take 1 tablet (650 mg total) by mouth 2 (two) times daily. 07/25/19   Roxan Hockey, MD  venlafaxine XR (EFFEXOR-XR) 150 MG 24 hr capsule Take 150 mg by mouth daily with breakfast.    [provider]    Physical Exam: Vitals:   12/07/19 0700 12/07/19 0715 12/07/19 0730 12/07/19 0745  BP: (!) 133/91 121/78 120/80 133/87  Pulse: 90 88 82 89  Resp:      Temp:        TempSrc:      SpO2: 100% 100% 100% 99%  Weight:      Height:        Constitutional: Mild distress secondary to headache and nausea; denies chest pain, no shortness of breath at rest.  Patient is afebrile. Vitals:   12/07/19 0700 12/07/19 0715 12/07/19 0730 12/07/19 0745  BP: (!) 133/91 121/78 120/80 133/87  Pulse: 90 88 82 89  Resp:      Temp:      TempSrc:      SpO2: 100% 100% 100% 99%  Weight:      Height:       Eyes: PERRL, lids and conjunctivae normal, no icterus. ENMT: Mucous membranes are moist.  Posterior pharynx clear of any exudate or lesions. Neck: normal, supple, no masses, no thyromegaly, no JVD seen. Respiratory: Positive rhonchi bilaterally; decreased breath sounds at the bases.  No using accessory muscle.  Good oxygen saturation on room air at rest. Cardiovascular: Regular rate and rhythm, no murmurs / rubs / gallops. No extremity edema. 2+ pedal pulses. No carotid bruits.  Abdomen: no tenderness, no masses palpated. No hepatosplenomegaly. Bowel sounds positive.  Musculoskeletal: no clubbing / cyanosis. No joint deformity upper and lower extremities. Good ROM, no contractures. Normal muscle tone.  Skin: no rashes, no petechiae. Neurologic: CN 2-12 grossly intact. Sensation intact, DTR normal.  Muscle strength 4 out of 5 bilaterally and symmetrically; in the setting of poor effort and deconditioning. Psychiatric: Normal judgment and insight. Alert and oriented x 3. Normal mood.    Labs on Admission: I have personally reviewed following labs and imaging studies  CBC: Recent Labs  Lab 12/07/19 0335  WBC 4.5  NEUTROABS 3.2  HGB 8.8*  HCT 28.7*  MCV 93.8  PLT 426    Basic Metabolic Panel: Recent Labs  Lab 12/07/19 0335  NA 137  K 4.4  CL 109  CO2 17*  GLUCOSE 103*  BUN 76*  CREATININE 7.60*  CALCIUM 8.3*    GFR: Estimated Creatinine Clearance: 7.3 mL/min (A) (by C-G formula based on SCr of 7.6 mg/dL (H)).  Liver Function Tests: Recent Labs   Lab 12/07/19 0335  AST 13*  ALT 13  ALKPHOS 76  BILITOT 0.6  PROT 7.1  ALBUMIN 3.8    Urine analysis:    Component Value Date/Time   COLORURINE YELLOW 07/20/2019 1458   APPEARANCEUR HAZY (A) 07/20/2019 1458   LABSPEC 1.008 07/20/2019 1458   PHURINE 5.0 07/20/2019 1458   GLUCOSEU NEGATIVE 07/20/2019 1458   HGBUR SMALL (A) 07/20/2019 1458   BILIRUBINUR NEGATIVE 07/20/2019 1458   BILIRUBINUR neg 12/01/2017 1030   KETONESUR NEGATIVE 07/20/2019 1458   PROTEINUR 100 (A) 07/20/2019 1458   UROBILINOGEN 0.2 12/01/2017 1030   UROBILINOGEN 0.2 07/03/2014 1130   NITRITE NEGATIVE 07/20/2019 1458   LEUKOCYTESUR TRACE (A) 07/20/2019 1458    Radiological Exams on Admission: DG Chest 2 View  Result Date: 12/07/2019 CLINICAL DATA:  End stage renal disease with shortness of breath. EXAM: CHEST - 2 VIEW COMPARISON:  07/20/2011 FINDINGS: Mild cardiac enlargement. No pleural effusion. Pulmonary vascular congestion and mild interstitial edema noted. No airspace opacities. Bilateral glenohumeral joint osteoarthritis. IMPRESSION: 1. Pulmonary vascular congestion with mild interstitial edema noted. Electronically Signed   By: Kerby Moors M.D.   On: 12/07/2019 04:17   CT Head Wo Contrast  Result Date: 12/07/2019 CLINICAL DATA:  Worsening headache EXAM: CT HEAD WITHOUT CONTRAST TECHNIQUE: Contiguous axial images were obtained from the base of the skull through the vertex without intravenous contrast. COMPARISON:  07/19/2019 FINDINGS: Brain: There is no mass, hemorrhage or extra-axial collection. The size and configuration of the ventricles and extra-axial CSF spaces are normal. There is hypoattenuation of the white matter, most commonly indicating chronic small vessel disease. Vascular: No abnormal hyperdensity of the major intracranial arteries or dural venous sinuses. No intracranial atherosclerosis. Skull: The visualized skull base, calvarium and extracranial soft tissues are normal. Sinuses/Orbits:  No fluid levels or advanced mucosal thickening of the visualized paranasal sinuses. No mastoid or middle ear effusion. The orbits are normal. IMPRESSION: Chronic small vessel disease, advanced for age, without acute intracranial abnormality. Electronically Signed   By: Ulyses Jarred M.D.   On: 12/07/2019 04:27  EKG: Independently reviewed.  Nonspecific T wave abnormality; no signs of acute ischemia.  Sinus rhythm.  Normal rate.  Assessment/Plan 1-hypertensive crisis -Worsening renal function and headaches as part of endorgan damage. -Patient received labetalol and also started on Cardene drip -100mg  x 1 dose of Lasix 100 mg also given while in the ED. -Nephrology service has been consulted for assistance into management of patient's worsening renal function, uncontrolled blood pressure and vascular congestion.  Will follow recommendations -Patient has been started on labetalol twice a day and as needed hydralazine; will attempt to wean her off Cardene drip and follow clinical response. -Denies chest pain currently.  2-nausea/vomiting -With concern for beginning of uremia development. -Using Compazine as needed -Continue PPI  3-end-stage renal disease -Recommendations by nephrology -Patient continued to refuse dialysis -Currently DNR and contemplating outpatient follow-up with palliative care. -Palliative care will be consulted, for assistance with logistics after discharge.  4-history of seizure disorders -Continue Keppra -No seizure activity appreciated currently.  5-kidney disease -Hemoglobin stable -Is receiving intermittent Epogen infusion as an outpatient -No signs of overt bleeding  6-hypocalcemia -Continue calcitriol and elemental calcium supplementation.  7-metabolic acidosis in the setting of renal failure -Continue sodium bicarb  8-depression -Continue Effexor  9-DNR/DNI -Long discussions with patient about future intervention and anticipated care -Patient has  expressed her wishes not to pursued any invasive life-saving intervention and continue refusing hemodialysis management at this time. -Wishes will be respected -No resuscitation or intubation will be provided; if patient declined to require this procedures will be kept comfortable and allowed to pass away with dignity. -Long-term prognosis very poor.   DVT prophylaxis: Heparin Code Status:   DNR Family Communication:  No family at bedside (patient expressed that she will contact her family member for updates). Disposition Plan:   Patient is from:  Home  Anticipated DC to:  Home  Anticipated DC date:  1-2 days  Anticipated DC barriers: Blood pressure control and improvement in her volume status/vascular congestion.  Consults called:  Nephrology service Admission status:  Inpatient, stepdown (while receiving Cardene drip); length of stay more than 2 midnights.  Severity of Illness: Moderate to severe severity; patient with end-stage renal disease presenting with headaches, worsening creatinine, shortness of breath on exertion and hypertensive crisis.  Requiring Cardene drip infusion for blood pressure control and IV diuretics to try to assist with ongoing vascular congestion.  Renal function has continued to deteriorate and at this time my ended developing uremia.  She continued to refuse dialysis.  Nephrology service has been consulted for assistance in management.    Barton Dubois MD [facility]  How to contact the [facility] Attending or Consulting provider [place] or covering provider during after hours [facility], for this patient?   1. Check the care team in [facility] and look for a) attending/consulting TR[facility] provider listed and b) the [facility] team listed 2. Log into [email or web address] and use [facility]'s universal password to access. If you do not have the password, please contact the hospital operator. 3. Locate the [facility] provider you are looking for under [facility] and page to a number that  you can be directly reached. 4. If you still have difficulty reaching the provider, please page the [facility] (Director on Call) for the Hospitalists listed on amion for assistance.  12/07/2019, 9:05 AM

## 2019-12-07 NOTE — TOC Initial Note (Signed)
Transition of Care [facility]) - Initial/Assessment Note    Patient Details  Name: [name] MRN: [id number] Date of Birth: May 20, 1965  Transition of Care [facility] CM/SW Contact:    Nicole Bence, LCSW Phone Number: 12/07/2019, 2:00 PM  Clinical Narrative:                 Patient is a 54 year old female admitted for hypertensive episode. CSW provided assessment for patient. Patient's sister reported that she has a history of HH, but unsure of provider. Patient's also discussed with CSW, that patient would like further evaluation to decide on SNF or [facility] referral. Patient lives alone in an apartment and is provided transportation to her doctor's appointments by her aunt. TOC to follow.    Barriers to Discharge: Continued Medical Work up   Patient Goals and CMS Choice     Choice offered to / list presented to : Patient  Expected Discharge Plan and Services         Living arrangements for the past 2 months: Apartment                                      Prior Living Arrangements/Services Living arrangements for the past 2 months: Apartment Lives with:: Self Patient language and need for interpreter reviewed:: Yes Do you feel safe going back to the place where you live?: Yes      Need for Family Participation in Patient Care: Yes (Comment) Care giver support system in place?: Yes (comment)   Criminal Activity/Legal Involvement Pertinent to Current Situation/Hospitalization: No - Comment as needed  Activities of Daily Living Home Assistive Devices/Equipment: Eyeglasses ADL Screening (condition at time of admission) Patient's cognitive ability adequate to safely complete daily activities?: Yes Is the patient deaf or have difficulty hearing?: No Does the patient have difficulty seeing, even when wearing glasses/contacts?: No Does the patient have difficulty concentrating, remembering, or making decisions?: No Patient able to express need for assistance with ADLs?:  Yes Does the patient have difficulty dressing or bathing?: No Independently performs ADLs?: Yes (appropriate for developmental age) Does the patient have difficulty walking or climbing stairs?: No Weakness of Legs: None Weakness of Arms/Hands: None  Permission Sought/Granted Permission sought to share information with : Family Supports Permission granted to share information with : Yes, Verbal Permission Granted  Share Information with NAME: [name]     Permission granted to share info w Relationship: Sister  Permission granted to share info w Contact Information: [phone]  Emotional Assessment       Orientation: : Oriented to Self, Oriented to Place, Oriented to  Time, Oriented to Situation Alcohol / Substance Use: Not Applicable Psych Involvement: No (comment)  Admission diagnosis:  Hypertensive crisis [I16.9] Hypertensive urgency [I16.0] Migraine without aura and without status migrainosus, not intractable [G43.009] Chronic kidney disease, unspecified CKD stage [N18.9] Patient Active Problem List   Diagnosis Date Noted  . Hypertensive crisis 12/07/2019  . DNR (do not resuscitate)   . Palliative care by specialist   . Goals of care, counseling/discussion   . Hypocalcemia 07/21/2019  . Acute renal failure superimposed on stage 4 chronic kidney disease ([place]) 09/10/2018  . Hypertensive urgency, malignant   . Acute renal failure superimposed on chronic kidney disease ([place])   . Hypertensive emergency 09/08/2018  . Acute renal injury ([place]) 09/08/2018  . Alkaline phosphatase elevation 03/29/2018  . B12 deficiency 03/29/2018  .  Vitamin D deficiency 03/29/2018  . History of herpes genitalis 03/22/2018  . Obesity (BMI 30.0-34.9) 03/22/2018  . Osteoarthritis 03/22/2018  . Seizure ([place]) 07/06/2017  . Noncompliance with medication regimen 04/27/2017  . Vasovagal syncope 03/29/2017  . Rotator cuff arthropathy of left shoulder 07/07/2016  . S/P shoulder surgery  07/07/2016  . LGSIL on Pap smear of cervix 04/26/2016  . Herpes simplex vulvovaginitis 03/01/2016  . Lower extremity edema 07/20/2015  . Chronic kidney disease, stage IV (severe) ([place]) 05/14/2015  . Proteinuria 05/14/2015  . Generalized headaches 01/05/2015  . Weight gain following gastric bypass surgery 08/13/2014  . Deficiency anemia 01/06/2014  . Iron deficiency anemia 01/06/2014  . Vitamin B12 deficiency (dietary) anemia 01/06/2014  . Essential hypertension, benign 01/25/2013  . Bipolar disorder ([place])    PCP:  Pixie Casino, MD Pharmacy:   [facility], [place] [address] Phone: [phone] Fax: [phone], [place] [address] Phone: [phone] Fax: [phone]     Social Determinants of Health (SDOH) Interventions    Readmission Risk Interventions No flowsheet data found.

## 2019-12-07 NOTE — Consult Note (Signed)
Nephrology Consult   Requesting provider: Barton Dubois Service requesting consult: Hospitalist Reason for consult: AKI on CKD, hypertension   Assessment/Recommendations: [name] is a/an 54 y.o. female with a past medical history CKD, anemia, seizure disorder, HTN, GERD, depression who present w/ HTN urgency and volume overload  AKI on CKD: Longstanding CKD most recently creatinine around 5 but fluctuates frequently.  Now with creatinine up to 7.6 with volume overload.  Likely contributors are cardiorenal syndrome, marked hypertension, progression of disease.  In the past patient has not wanted dialysis but she is more open to that at this point understanding that she could ultimately be home dialysis or transplant candidate.  We will try to temporize her at this time so she can continue dialysis planning outpatient but may become necessary during this hospitalization. -IV Lasix and metolazone as below -Continue to monitor daily Cr, Dose meds for GFR -Monitor Daily I/Os, Daily weight  -Maintain MAP>65 for optimal renal perfusion.  -Avoid nephrotoxic medications including NSAIDs and Vanc/Zosyn combo -Check Renal U/S to rule out obstruction -Currently no indication for HD, palliative care consulted, continue discussions regarding dialysis  Volume Status: Appears volume overloaded on exam also with weight gain in the outpatient setting.  Will give IV Lasix 160 mg with 5 mg of metolazone.  Can repeat doses as needed to help her be negative.  Likely hypertension is directly linked to volume overload.  Hypertension: Marked elevation in blood pressure.  Can continue Cardene drip for now and wean off as able.  Start hydralazine 50 mg 3 times daily.  Can continue labetalol 200 mg twice daily.  Diuresis as above  Secondary hyperparathyroidism: On calcitriol 38mcg daily.  Phosphorus 7.1 start sevelamer 800 mg 3 times daily  Anemia due to CKD: Hemoglobin 8.8.  Iron sat 25 and ferritin 49 on  11/22/2019.  Will consider iron and Aranesp in the future.  Non-anion gap metabolic acidosis: Associated with CKD.  Continue bicarb 650 mg 3 times daily.   Recommendations conveyed to primary service.    [facility] 12/07/2019 1:29 PM   _____________________________________________________________________________________ CC: AKI on CKD, HTN  History of Present Illness: [name] is a/an 54 y.o. female with a past medical history of CKD, anemia, seizure disorder, HTN, GERD, depression who presents to the hospital with headache and dyspnea on exertion.  The patient presented to the hospital today.  She states that for 3 days in a row she has had excruciating headache that has not improved.  She has had this issue in the past and it was associated with high blood pressure.  She knows that her blood pressure has been harder to control lately and she has required additional medications in the outpatient setting.  She has also noticed worsening shortness of breath particularly on exertion over the past week or so.  She has noted significant weight gain over the past month with about 20 pound increase.  She has been compliant with her medications.  She has been following with Dr. Hollie Salk from nephrology in the outpatient setting.  In the past she has not wanted dialysis but she is now more open to this.  She is also considering the possibility of transplant in the future.  She denies chest pain but does have some mild nausea.  In the emergency department she was found to be markedly hypertensive and was ultimately placed on a Cardene drip after intermittent IV labetalol was ineffective.  Chest x-ray demonstrated vascular congestion.  She was given Lasix 100 mg x 1.   Medications:  Current Facility-Administered Medications  Medication Dose Route Frequency Provider Last Rate Last Admin  . 0.9 %  sodium chloride infusion  250 mL Intravenous PRN Barton Dubois, MD       . acetaminophen (TYLENOL) tablet 650 mg  650 mg Oral Q8H PRN Barton Dubois, MD      . aspirin EC tablet 81 mg  81 mg Oral Q breakfast Barton Dubois, MD   81 mg at 12/07/19 1140  . calcitRIOL (ROCALTROL) capsule 1 mcg  1 mcg Oral Daily Barton Dubois, MD   1 mcg at 12/07/19 1202  . calcium carbonate (OS-CAL - dosed in mg of elemental calcium) tablet 1,000 mg of elemental calcium  2 tablet Oral BID WC Barton Dubois, MD   1,000 mg of elemental calcium at 12/07/19 1141  . calcium carbonate (TUMS - dosed in mg elemental calcium) chewable tablet 200 mg of elemental calcium  1 tablet Oral Q12H PRN Barton Dubois, MD      . heparin injection 5,000 Units  5,000 Units Subcutaneous Q8H Barton Dubois, MD      . hydrALAZINE (APRESOLINE) injection 10 mg  10 mg Intravenous Q6H PRN Barton Dubois, MD      . hydrOXYzine (ATARAX/VISTARIL) tablet 25 mg  25 mg Oral BID Barton Dubois, MD   25 mg at 12/07/19 1143  . labetalol (NORMODYNE) tablet 200 mg  200 mg Oral BID Barton Dubois, MD   200 mg at 12/07/19 1143  . levETIRAcetam (KEPPRA XR) 24 hr tablet 500 mg  500 mg Oral Daily Barton Dubois, MD   500 mg at 12/07/19 1140  . nicardipine (CARDENE) 20mg  in 0.86% saline 259ml IV infusion (0.1 mg/ml)  3-15 mg/hr Intravenous Continuous Rolland Porter, MD 50 mL/hr at 12/07/19 1312 5 mg/hr at 12/07/19 1312  . pantoprazole (PROTONIX) EC tablet 40 mg  40 mg Oral Daily Barton Dubois, MD   40 mg at 12/07/19 1142  . promethazine (PHENERGAN) injection 12.5 mg  12.5 mg Intravenous Q6H PRN Barton Dubois, MD      . sodium bicarbonate tablet 650 mg  650 mg Oral TID Barton Dubois, MD   650 mg at 12/07/19 1142  . sodium chloride flush (NS) 0.9 % injection 3 mL  3 mL Intravenous Q12H Barton Dubois, MD      . sodium chloride flush (NS) 0.9 % injection 3 mL  3 mL Intravenous PRN Barton Dubois, MD      . tiZANidine (ZANAFLEX) tablet 4 mg  4 mg Oral Q12H PRN Barton Dubois, MD      . venlafaxine XR (EFFEXOR-XR) 24 hr capsule 150 mg   150 mg Oral Q breakfast Barton Dubois, MD         ALLERGIES Ambien [zolpidem tartrate], Geodon [ziprasidone hcl], Lactulose, Ondansetron, Quetiapine, Sulfa antibiotics, and Zolpidem  MEDICAL HISTORY Past Medical History:  Diagnosis Date  . Anemia   . Anxiety   . Bipolar disorder ([place])   . Depression   . GERD (gastroesophageal reflux disease)   . History of blood transfusion   . Hypertension   . IUD    HISTORY OF IUD --REMOVED IN 2006  . Seizures ([place]) [id number]   two seizures due to a med changes     SOCIAL HISTORY Social History   Socioeconomic History  . Marital status: Single    Spouse name: Not on file  . Number of children: 0  . Years of education: 51  .  Highest education level: Not on file  Occupational History  . Occupation: Retired  Tobacco Use  . Smoking status: Former Smoker    Packs/day: 0.25    Years: 4.00    Pack years: 1.00    Types: Cigarettes    Quit date: 03/21/1988    Years since quitting: 31.7  . Smokeless tobacco: Never Used  Vaping Use  . Vaping Use: Never used  Substance and Sexual Activity  . Alcohol use: Yes    Comment: 1-2 times a year.   . Drug use: No  . Sexual activity: Not Currently    Birth control/protection: I.U.D.  Other Topics Concern  . Not on file  Social History Narrative   Fun: Puzzles, watch mystery shows, read   Denies religious beliefs effecting health care.       Pt lives in 2 story home with her sister-in-law and niece   Some college education   Retired Scientist, research (medical)    Social Determinants of Radio broadcast assistant Strain:   . Difficulty of Paying Living Expenses: Not on file  Food Insecurity:   . Worried About Charity fundraiser in the Last Year: Not on file  . Ran Out of Food in the Last Year: Not on file  Transportation Needs:   . Lack of Transportation (Medical): Not on file  . Lack of Transportation (Non-Medical): Not on file  Physical Activity:   . Days of Exercise per Week: Not on file  .  Minutes of Exercise per Session: Not on file  Stress:   . Feeling of Stress : Not on file  Social Connections:   . Frequency of Communication with Friends and Family: Not on file  . Frequency of Social Gatherings with Friends and Family: Not on file  . Attends Religious Services: Not on file  . Active Member of Clubs or Organizations: Not on file  . Attends Archivist Meetings: Not on file  . Marital Status: Not on file  Intimate Partner Violence:   . Fear of Current or Ex-Partner: Not on file  . Emotionally Abused: Not on file  . Physically Abused: Not on file  . Sexually Abused: Not on file     FAMILY HISTORY Family History  Problem Relation Age of Onset  . Hypertension Mother   . Kidney failure Mother   . Hypertension Father   . Diabetes Cousin   . Diabetes Sister   . Hypertension Sister   . Kidney disease Sister        dialysis  . Hypertension Brother   . Heart attack Brother   . Hypertension Maternal Aunt   . Hypertension Maternal Uncle   . Hypertension Maternal Grandmother   . Hypertension Maternal Grandfather   . Hypertension Paternal Grandmother   . Hypertension Paternal Grandfather   . Heart attack Brother   . Hypertension Brother   . Hypertension Sister       Review of Systems: 12 systems reviewed Otherwise as per HPI, all other systems reviewed and negative  Physical Exam: Vitals:   12/07/19 1200 12/07/19 1215  BP: (!) 152/96   Pulse: 99 98  Resp: (!) 24 15  Temp:    SpO2: 99% 100%   No intake/output data recorded. No intake or output data in the 24 hours ending 12/07/19 1329 General: well-appearing, in mild distress HEENT: anicteric sclera, oropharynx clear without lesions CV: Normal rate, normal rhythm, no murmurs, trace peripheral edema present Lungs: Bilateral crackles, no increased work of  breathing Abd: soft, non-tender, minimal distention Skin: no visible lesions or rashes Psych: alert, engaged, appropriate mood and  affect Musculoskeletal: no obvious deformities Neuro: normal speech, no gross focal deficits   Test Results Reviewed Lab Results  Component Value Date   NA 137 12/07/2019   K 4.4 12/07/2019   CL 109 12/07/2019   CO2 17 (L) 12/07/2019   BUN 76 (H) 12/07/2019   CREATININE 7.60 (H) 12/07/2019   GFR 30.27 (L) 02/15/2017   CALCIUM 8.3 (L) 12/07/2019   ALBUMIN 3.8 12/07/2019   PHOS 7.2 (H) 12/07/2019     I have reviewed all relevant outside healthcare records related to the patient's current hospitalization

## 2019-12-07 NOTE — ED Triage Notes (Signed)
Pt c/o headaches x3 days and nausea. Per EMS BP was 200/140s en route for several readings. Pt just started new BP med 3 weeks ago.

## 2019-12-08 ENCOUNTER — Inpatient Hospital Stay (HOSPITAL_COMMUNITY): Payer: Medicare Other

## 2019-12-08 DIAGNOSIS — N189 Chronic kidney disease, unspecified: Secondary | ICD-10-CM

## 2019-12-08 DIAGNOSIS — D631 Anemia in chronic kidney disease: Secondary | ICD-10-CM

## 2019-12-08 DIAGNOSIS — N179 Acute kidney failure, unspecified: Secondary | ICD-10-CM

## 2019-12-08 DIAGNOSIS — N185 Chronic kidney disease, stage 5: Secondary | ICD-10-CM

## 2019-12-08 DIAGNOSIS — G4459 Other complicated headache syndrome: Secondary | ICD-10-CM

## 2019-12-08 LAB — RENAL FUNCTION PANEL
Albumin: 3.6 g/dL (ref 3.5–5.0)
Anion gap: 16 — ABNORMAL HIGH (ref 5–15)
BUN: 76 mg/dL — ABNORMAL HIGH (ref 6–20)
CO2: 17 mmol/L — ABNORMAL LOW (ref 22–32)
Calcium: 8.6 mg/dL — ABNORMAL LOW (ref 8.9–10.3)
Chloride: 107 mmol/L (ref 98–111)
Creatinine, Ser: 8.04 mg/dL — ABNORMAL HIGH (ref 0.44–1.00)
GFR calc Af Amer: 6 mL/min — ABNORMAL LOW (ref >60)
GFR calc non Af Amer: 5 mL/min — ABNORMAL LOW (ref >60)
Glucose, Bld: 106 mg/dL — ABNORMAL HIGH (ref 70–99)
Phosphorus: 8.6 mg/dL — ABNORMAL HIGH (ref 2.5–4.6)
Potassium: 3.9 mmol/L (ref 3.5–5.1)
Sodium: 140 mmol/L (ref 135–145)

## 2019-12-08 MED ORDER — SODIUM CHLORIDE 0.9 % IV SOLN: 510.0000 mg | Freq: Once | INTRAVENOUS | Status: DC

## 2019-12-08 MED ORDER — FUROSEMIDE 10 MG/ML IJ SOLN
120.0000 mg | Freq: Two times a day (BID) | INTRAVENOUS | Status: DC
  Administered 2019-12-08 – 2019-12-12 (×8): 120 mg via INTRAVENOUS
  Filled 2019-12-08 (×9): qty 12

## 2019-12-08 MED ORDER — FUROSEMIDE 10 MG/ML IJ SOLN: 120.0000 mg | Freq: Two times a day (BID) | INTRAVENOUS | Status: DC

## 2019-12-08 MED ORDER — SODIUM BICARBONATE 650 MG PO TABS
1300.0000 mg | ORAL_TABLET | Freq: Three times a day (TID) | ORAL | Status: DC
  Administered 2019-12-08 – 2019-12-11 (×11): 1300 mg via ORAL
  Filled 2019-12-08 (×10): qty 2

## 2019-12-08 MED ORDER — SODIUM CHLORIDE 0.9 % IV SOLN
125.0000 mg | Freq: Every day | INTRAVENOUS | Status: AC
  Administered 2019-12-08 – 2019-12-11 (×3): 125 mg via INTRAVENOUS
  Filled 2019-12-08 (×4): qty 10

## 2019-12-08 NOTE — Progress Notes (Signed)
PROGRESS NOTE    [name]  BDZ:[id number] DOB: Apr 03, 1965 DOA: 12/07/2019 PCP: Pixie Casino, MD    Chief Complaint  Patient presents with  . Hypertension    Brief Narrative:  [name] is a 54 y.o. female with medical history significant of with a past medical history significant for anemia of chronic disease, seizure disorders, hypertension, gastroesophageal flux disease, bipolar disorder/depression and end-stage renal disease (refusing hemodialysis); who presented to the hospital secondary to headaches, nausea and shortness of breath on exertion.  Patient reports symptom has been present for the last 3-4 days and worsening.  She denies any fever, hematemesis, hematuria, melena, hematochezia, abdominal pain, blurred vision, focal weakness or any other complaints.  Reports to be compliant with her medications no recent changes to them.  No sick contacts.  ED Course: Patient found in hypertensive crisis, chest x-ray demonstrating vascular congestion and with worsening in her renal function and BUN.  Labetalol x5 doses given throughout her ED stay and subsequently due to failure improvement in her blood pressure was started on Cardene drip.  CT scan of the head demonstrated no acute intracranial abnormalities.  For her vascular congestion 100 mg of Lasix times once given.  TRH consulted to admit patient for further evaluation and management.  Assessment & Plan: 1-hypertensive crisis -With worsening renal function and headaches as part of endorgan damage. -Patient's BP significantly improved and currently transitioning off cardene drip.  -nephrology service assisting with diuretics management -continue renal/low sodium diet  -patient expressed desired for HD today, will follow nephrology rec's -continue labetalol and PRN hydralazine  -no CP, no further headaches.   2-nausea/vomiting -continue PRN antiemetics -continue PPI  3-end-stage renal disease -continue to  follow recommendations by nephrology -Patient expressed interest in HD today. -will follow clinical response and decide initiation time.  4-history of seizure disorders -continue keppra.   5-anemia of chronic kidney disease -iron and epogen therapy as per nephrology discretion   6-hypocalcemia -Continue calcitriol and elemental calcium supplementation. -follow electrolytes.   7-metabolic acidosis in the setting of renal failure -Continue sodium bicarb, dose adjusted for improvement in ongoing metabolic acidosis   8-depression -no SI or hallucinations -continue effexor   9-DNR/DNI -Long discussions with patient about future intervention and anticipated care -Patient has expressed her wishes for HD currently, nephrology to further discussed with her about and assist with decisions on initiation and access.  -expressed still not wanting artifical life support, shocking or intubation.  -Wishes will be respected. -Long-term prognosis without HD very poor.    DVT prophylaxis: heparin  Code Status: DNR  Family Communication: no family at bedside Disposition:   Status is: Inpatient  Dispo: The patient is from: home               Anticipated d/c is to: home               Anticipated d/c date is: 1-2 days              Patient currently not medically stable for discharge yet; still SOB with activity and having nausea; renal function worse today. No CP, no headaches. Will follow nephrology rec's, continue adjusting antihypertensive agents and follow her off cardene drip     Consultants:   Nephrology service  Palliative care   Procedures:  See below for x-ray reports    Antimicrobials:  None    Subjective: Reports increase urine output, no CP, no headaches and improved breathing. Positive nausea.   Objective:  Vitals:   12/08/19 0615 12/08/19 0630 12/08/19 0645 12/08/19 0700  BP: 120/63 117/64 123/65   Pulse: 96 95 95 96  Resp: 15 16 15 16   Temp:        TempSrc:      SpO2: 97% 97% 97% 96%  Weight:      Height:        Intake/Output Summary (Last 24 hours) at 12/08/2019 1229 Last data filed at 12/08/2019 0538 Gross per 24 hour  Intake 1894.64 ml  Output 3425 ml  Net -1530.36 ml   Filed Weights   12/07/19 0255 12/08/19 0500  Weight: 70.3 kg 64.9 kg    Examination:  General exam: Appears calm and comfortable, reports not headaches. Still with intermittent nausea, no vomiting. Breathing a whole lot better.  Respiratory system: positive rhonchi and decreased BS at the bases bilaterally.  Cardiovascular system: S1 & S2 heard, RRR. No JVD, no murmur, no rub.s  Gastrointestinal system: Abdomen is nondistended, soft and nontender. No organomegaly or masses felt. Normal bowel sounds heard. Central nervous system: Alert and oriented. No focal neurological deficits. Extremities: trace edema bilaterally, no cyanosis, no clubbing  Skin: No rashes, lno petechiae.  Psychiatry: Mood & affect appropriate.    Data Reviewed: I have personally reviewed following labs and imaging studies  CBC: Recent Labs  Lab 12/07/19 0335  WBC 4.5  NEUTROABS 3.2  HGB 8.8*  HCT 28.7*  MCV 93.8  PLT 633    Basic Metabolic Panel: Recent Labs  Lab 12/07/19 0335 12/07/19 0830 12/08/19 0548  NA 137  --  140  K 4.4  --  3.9  CL 109  --  107  CO2 17*  --  17*  GLUCOSE 103*  --  106*  BUN 76*  --  76*  CREATININE 7.60*  --  8.04*  CALCIUM 8.3*  --  8.6*  MG  --  1.9  --   PHOS  --  7.2* 8.6*    GFR: Estimated Creatinine Clearance: 6.6 mL/min (A) (by C-G formula based on SCr of 8.04 mg/dL (H)).  Liver Function Tests: Recent Labs  Lab 12/07/19 0335 12/08/19 0548  AST 13*  --   ALT 13  --   ALKPHOS 76  --   BILITOT 0.6  --   PROT 7.1  --   ALBUMIN 3.8 3.6    CBG: No results for input(s): GLUCAP in the last 168 hours.   Recent Results (from the past 240 hour(s))  SARS Coronavirus 2 by RT PCR (hospital order, performed in [facility]  hospital lab) Nasopharyngeal Nasopharyngeal Swab     Status: None   Collection Time: 12/07/19  6:37 AM   Specimen: Nasopharyngeal Swab  Result Value Ref Range Status   SARS Coronavirus 2 NEGATIVE NEGATIVE Final    Comment: (NOTE) SARS-CoV-2 target nucleic acids are NOT DETECTED.  The SARS-CoV-2 RNA is generally detectable in upper and lower respiratory specimens during the acute phase of infection. The lowest concentration of SARS-CoV-2 viral copies this assay can detect is 250 copies / mL. A negative result does not preclude SARS-CoV-2 infection and should not be used as the sole basis for treatment or other patient management decisions.  A negative result may occur with improper specimen collection / handling, submission of specimen other than nasopharyngeal swab, presence of viral mutation(s) within the areas targeted by this assay, and inadequate number of viral copies (<250 copies / mL). A negative result must be combined with clinical observations, patient history,  and epidemiological information.  Fact Sheet for Patients:   StrictlyIdeas.no  Fact Sheet for Healthcare Providers: BankingDealers.co.za  This test is not yet approved or  cleared by the [place] FDA and has been authorized for detection and/or diagnosis of SARS-CoV-2 by FDA under an Emergency Use Authorization (EUA).  This EUA will remain in effect (meaning this test can be used) for the duration of the COVID-19 declaration under Section 564(b)(1) of the Act, 21 U.S.C. section 360bbb-3(b)(1), unless the authorization is terminated or revoked sooner.  Performed at [facility], [address], [place], [place] [address]   MRSA PCR Screening     Status: None   Collection Time: 12/07/19 10:07 AM   Specimen: Nasal Mucosa; Nasopharyngeal  Result Value Ref Range Status   MRSA by PCR NEGATIVE NEGATIVE Final    Comment:        The GeneXpert MRSA Assay (FDA approved  for NASAL specimens only), is one component of a comprehensive MRSA colonization surveillance program. It is not intended to diagnose MRSA infection nor to guide or monitor treatment for MRSA infections. Performed at [facility], [address] [place], [place] [address]       Radiology Studies: DG Chest 2 View  Result Date: 12/07/2019 CLINICAL DATA:  End stage renal disease with shortness of breath. EXAM: CHEST - 2 VIEW COMPARISON:  07/20/2011 FINDINGS: Mild cardiac enlargement. No pleural effusion. Pulmonary vascular congestion and mild interstitial edema noted. No airspace opacities. Bilateral glenohumeral joint osteoarthritis. IMPRESSION: 1. Pulmonary vascular congestion with mild interstitial edema noted. Electronically Signed   By: Kerby Moors M.D.   On: 12/07/2019 04:17   CT Head Wo Contrast  Result Date: 12/07/2019 CLINICAL DATA:  Worsening headache EXAM: CT HEAD WITHOUT CONTRAST TECHNIQUE: Contiguous axial images were obtained from the base of the skull through the vertex without intravenous contrast. COMPARISON:  07/19/2019 FINDINGS: Brain: There is no mass, hemorrhage or extra-axial collection. The size and configuration of the ventricles and extra-axial CSF spaces are normal. There is hypoattenuation of the white matter, most commonly indicating chronic small vessel disease. Vascular: No abnormal hyperdensity of the major intracranial arteries or dural venous sinuses. No intracranial atherosclerosis. Skull: The visualized skull base, calvarium and extracranial soft tissues are normal. Sinuses/Orbits: No fluid levels or advanced mucosal thickening of the visualized paranasal sinuses. No mastoid or middle ear effusion. The orbits are normal. IMPRESSION: Chronic small vessel disease, advanced for age, without acute intracranial abnormality. Electronically Signed   By: Ulyses Jarred M.D.   On: 12/07/2019 04:27    Scheduled Meds: . aspirin EC  81 mg Oral Q breakfast  . calcitRIOL   1 mcg Oral Daily  . calcium carbonate  2 tablet Oral BID WC  . Chlorhexidine Gluconate Cloth  6 each Topical Daily  . heparin  5,000 Units Subcutaneous Q8H  . hydrOXYzine  25 mg Oral BID  . influenza vac split quadrivalent PF  0.5 mL Intramuscular Tomorrow-1000  . labetalol  200 mg Oral BID  . levETIRAcetam  500 mg Oral Daily  . pantoprazole  40 mg Oral Daily  . pneumococcal 23 valent vaccine  0.5 mL Intramuscular Tomorrow-1000  . sevelamer carbonate  800 mg Oral TID WC  . sodium bicarbonate  1,300 mg Oral TID  . sodium chloride flush  3 mL Intravenous Q12H  . venlafaxine XR  150 mg Oral Q breakfast   Continuous Infusions: . sodium chloride    . ferric gluconate (FERRLECIT/NULECIT) IV    . furosemide    .  niCARDipine Stopped (12/08/19 1123)     LOS: 1 day    Time spent: 35 minutes.    Barton Dubois, MD [facility]   To contact the attending provider between 7A-7P or the covering provider during after hours 7P-7A, please log into the web site [email or web address] and access using universal [place] password for that web site. If you do not have the password, please call the hospital operator.  12/08/2019, 12:29 PM

## 2019-12-08 NOTE — Progress Notes (Signed)
Nephrology Follow-Up Consult note   Assessment/Recommendations: [name] is a/an 54 y.o. female with a past medical history significant for CKD, anemia, seizure disorder, HTN, GERD, depression who present w/ HTN urgency and volume overload  AKI on CKD: Longstanding CKD w/ recent BL ~5 but fluctuates. Crt 7.6 here and rising to 8 today. Likely contributors to elevation are cardiorenal syndrome, marked hypertension, progression of disease. Previously not open to dialysis but now more amenable. Continuing to try and temporize but may have to start here if she fails to improve.  -Diuretics as below -Continue to monitor daily Cr, Dose meds for GFR -Monitor Daily I/Os, Daily weight  -Maintain MAP>65 for optimal renal perfusion.  -Avoid nephrotoxic medications including NSAIDs and Vanc/Zosyn combo -F/u Renal US -Currently no indication for HD, open to dialysis when necessary  Volume Status: Appears volume overloaded on exam also with weight gain in the outpatient setting.  Good response to lasix and metolazone yesterday. Continue with IV lasix 120mg  IV BID.  Hypertension: Marked elevation in blood pressure initially significantly improved with volume removal. Now off cardene gtt and BP normalize. Stop hydral and continue labetalol. Diuresis as above.   Secondary hyperparathyroidism: On calcitriol 36mcg daily.  Started sevelamer 800mg  TID on 9/19. Continue to titrate PRN  Anemia due to CKD: Hemoglobin 8.8.  Iron sat 25 and ferritin 49 on 11/22/2019. Dose feraheme x1 and consider Aranesp in the future.  Anion gap metabolic acidosis: Associated with CKD and AKI.  Increase bicarb to 1300 mg 3 times daily.    Recommendations conveyed to primary service.    [facility] 12/08/2019 11:44 AM  ___________________________________________________________  CC: AKI on CKD  Interval History/Subjective: Some nausea but overall improving and headache better.   States that she is continued to think about dialysis and will be open to it whenever it becomes time.   Medications:  Current Facility-Administered Medications  Medication Dose Route Frequency Provider Last Rate Last Admin  . 0.9 %  sodium chloride infusion  250 mL Intravenous PRN Barton Dubois, MD      . acetaminophen (TYLENOL) tablet 650 mg  650 mg Oral Q8H PRN Barton Dubois, MD   650 mg at 12/07/19 1658  . aspirin EC tablet 81 mg  81 mg Oral Q breakfast Barton Dubois, MD   81 mg at 12/08/19 0837  . calcitRIOL (ROCALTROL) capsule 1 mcg  1 mcg Oral Daily Barton Dubois, MD   1 mcg at 12/08/19 0839  . calcium carbonate (OS-CAL - dosed in mg of elemental calcium) tablet 1,000 mg of elemental calcium  2 tablet Oral BID WC Barton Dubois, MD   1,000 mg of elemental calcium at 12/08/19 0847  . calcium carbonate (TUMS - dosed in mg elemental calcium) chewable tablet 200 mg of elemental calcium  1 tablet Oral Q12H PRN Barton Dubois, MD   200 mg of elemental calcium at 12/07/19 1424  . Chlorhexidine Gluconate Cloth 2 % PADS 6 each  6 each Topical Daily Barton Dubois, MD   6 each at 12/08/19 0842  . heparin injection 5,000 Units  5,000 Units Subcutaneous Q8H Barton Dubois, MD   5,000 Units at 12/08/19 0502  . hydrALAZINE (APRESOLINE) injection 10 mg  10 mg Intravenous Q6H PRN Barton Dubois, MD      . HYDROmorphone (DILAUDID) injection 1 mg  1 mg Intravenous Q4H PRN Barton Dubois, MD   1 mg at 12/08/19 1018  . hydrOXYzine (ATARAX/VISTARIL) tablet 25 mg  25 mg  Oral BID Barton Dubois, MD   25 mg at 12/08/19 0840  . influenza vac split quadrivalent PF (FLUARIX) injection 0.5 mL  0.5 mL Intramuscular Tomorrow-1000 Barton Dubois, MD      . labetalol (NORMODYNE) tablet 200 mg  200 mg Oral BID Barton Dubois, MD   200 mg at 12/08/19 0840  . levETIRAcetam (KEPPRA XR) 24 hr tablet 500 mg  500 mg Oral Daily Barton Dubois, MD   500 mg at 12/08/19 1113  . nicardipine (CARDENE) 20mg  in 0.86% saline 265ml IV  infusion (0.1 mg/ml)  3-15 mg/hr Intravenous Continuous Rolland Porter, MD   Stopped at 12/08/19 1123  . pantoprazole (PROTONIX) EC tablet 40 mg  40 mg Oral Daily Barton Dubois, MD   40 mg at 12/08/19 0837  . pneumococcal 23 valent vaccine (PNEUMOVAX-23) injection 0.5 mL  0.5 mL Intramuscular Tomorrow-1000 Barton Dubois, MD      . promethazine (PHENERGAN) injection 12.5 mg  12.5 mg Intravenous Q6H PRN Barton Dubois, MD   12.5 mg at 12/08/19 1018  . sevelamer carbonate (RENVELA) tablet 800 mg  800 mg Oral TID WC Reesa Chew, MD   800 mg at 12/08/19 0840  . sodium bicarbonate tablet 1,300 mg  1,300 mg Oral TID Reesa Chew, MD   1,300 mg at 12/08/19 0837  . sodium chloride flush (NS) 0.9 % injection 3 mL  3 mL Intravenous Q12H Barton Dubois, MD   3 mL at 12/07/19 2109  . sodium chloride flush (NS) 0.9 % injection 3 mL  3 mL Intravenous PRN Barton Dubois, MD      . temazepam (RESTORIL) capsule 15 mg  15 mg Oral QHS PRN Barton Dubois, MD   15 mg at 12/07/19 2033  . tiZANidine (ZANAFLEX) tablet 4 mg  4 mg Oral Q12H PRN Barton Dubois, MD      . venlafaxine XR (EFFEXOR-XR) 24 hr capsule 150 mg  150 mg Oral Q breakfast Barton Dubois, MD   150 mg at 12/08/19 8119      Review of Systems: 10 systems reviewed and negative except per interval history/subjective  Physical Exam: Vitals:   12/08/19 0645 12/08/19 0700  BP: 123/65   Pulse: 95 96  Resp: 15 16  Temp:    SpO2: 97% 96%   No intake/output data recorded.  Intake/Output Summary (Last 24 hours) at 12/08/2019 1144 Last data filed at 12/08/2019 0538 Gross per 24 hour  Intake 1894.64 ml  Output 3425 ml  Net -1530.36 ml   Constitutional: well-appearing, no acute distress ENMT: ears and nose without scars or lesions, MMM CV: normal rate, trace edema in lower extremities Respiratory: bilateral chest rise, normal work of breathing Gastrointestinal: soft, non-tender, no palpable masses or hernias Skin: no visible lesions or  rashes Psych: alert, judgement/insight appropriate, appropriate mood and affect   Test Results I personally reviewed new and old clinical labs and radiology tests Lab Results  Component Value Date   NA 140 12/08/2019   K 3.9 12/08/2019   CL 107 12/08/2019   CO2 17 (L) 12/08/2019   BUN 76 (H) 12/08/2019   CREATININE 8.04 (H) 12/08/2019   GFR 30.27 (L) 02/15/2017   CALCIUM 8.6 (L) 12/08/2019   ALBUMIN 3.6 12/08/2019   PHOS 8.6 (H) 12/08/2019

## 2019-12-09 ENCOUNTER — Inpatient Hospital Stay (HOSPITAL_COMMUNITY): Payer: Medicare Other

## 2019-12-09 DIAGNOSIS — N185 Chronic kidney disease, stage 5: Secondary | ICD-10-CM

## 2019-12-09 DIAGNOSIS — I169 Hypertensive crisis, unspecified: Secondary | ICD-10-CM

## 2019-12-09 DIAGNOSIS — Z7189 Other specified counseling: Secondary | ICD-10-CM

## 2019-12-09 DIAGNOSIS — Z515 Encounter for palliative care: Secondary | ICD-10-CM

## 2019-12-09 DIAGNOSIS — N186 End stage renal disease: Secondary | ICD-10-CM

## 2019-12-09 LAB — RENAL FUNCTION PANEL
Albumin: 3.5 g/dL (ref 3.5–5.0)
Anion gap: 15 (ref 5–15)
BUN: 77 mg/dL — ABNORMAL HIGH (ref 6–20)
CO2: 19 mmol/L — ABNORMAL LOW (ref 22–32)
Calcium: 8 mg/dL — ABNORMAL LOW (ref 8.9–10.3)
Chloride: 104 mmol/L (ref 98–111)
Creatinine, Ser: 8.83 mg/dL — ABNORMAL HIGH (ref 0.44–1.00)
GFR calc Af Amer: 5 mL/min — ABNORMAL LOW (ref >60)
GFR calc non Af Amer: 5 mL/min — ABNORMAL LOW (ref >60)
Glucose, Bld: 81 mg/dL (ref 70–99)
Phosphorus: 8.4 mg/dL — ABNORMAL HIGH (ref 2.5–4.6)
Potassium: 3.9 mmol/L (ref 3.5–5.1)
Sodium: 138 mmol/L (ref 135–145)

## 2019-12-09 LAB — PROTIME-INR
INR: 1.1 (ref 0.8–1.2)
Prothrombin Time: 14.1 seconds (ref 11.4–15.2)

## 2019-12-09 MED ORDER — AMLODIPINE BESYLATE 5 MG PO TABS
10.0000 mg | ORAL_TABLET | Freq: Every day | ORAL | Status: DC
  Administered 2019-12-09 – 2019-12-12 (×3): 10 mg via ORAL
  Filled 2019-12-09 (×5): qty 2

## 2019-12-09 MED ORDER — CHLORHEXIDINE GLUCONATE CLOTH 2 % EX PADS
6.0000 | MEDICATED_PAD | Freq: Every day | CUTANEOUS | Status: DC
  Administered 2019-12-10 – 2019-12-13 (×4): 6 via TOPICAL

## 2019-12-09 NOTE — Progress Notes (Signed)
Spoke with Doug from [employer] and scheduled transportation. Patient to be pickup at 9:15 a.m. on 12/10/19 to arrive at [place] at 1000.

## 2019-12-09 NOTE — Consult Note (Signed)
Consultation Note Date: 12/09/2019   Patient Name: [name]  DOB: 1966/01/05  MRN: [id number]  Age / Sex: 54 y.o., female  PCP: Pixie Casino, MD Referring Physician: Barton Dubois, MD  Reason for Consultation: Establishing goals of care  HPI/Patient Profile: 54 y.o. female  with past medical history of ESRD previously declining dialysis, anemia of chronic disease, seizures, hypertension, GERD, bipolar disorder, depression admitted on 12/07/2019 with headaches, nausea and shortness of breath on exertion. In ED, found in hypertensive crisis with worsening renal failure. Nephrology following and after further discussions, patient is now agreeable with initiation of hemodialysis. Vascular surgery consulted for fistula/graft placement. Will have temp HD cath placed by IR 9/21. Palliative medicine consultation for goals of care.   Clinical Assessment and Goals of Care:  I have reviewed medical records, discussed with care team and met with patient at bedside. Multiple providers have discussed plan of care with patient today. [name] has decided to start hemodialysis and is appreciative of conversations with providers.   [name] is known to this NP from previous palliative consultation on Jul 24, 2019. Note and MOST form reviewed.   I introduced Palliative Medicine as specialized medical care for people living with serious illness. It focuses on providing relief from the symptoms and stress of a serious illness. The goal is to improve quality of life for both the patient and the family.  Discussed events leading up to admission and course of hospitalization including diagnoses, interventions, plan of care. Reviewed recommendation from specialists. [name] confirms her decision for initiation of dialysis. She is hopeful to eventually be considered for a renal transplant and is in discussions with outpatient  providers.   Reviewed patient's previously documented MOST form and wishes. [name] confirms her decision for ongoing DNR/DNI code status and NO feeding tube if this was necessary. She is ok with treating the treatable including IVF and antibiotics. [name] has not completed HCPOA paperwork. Spiritual consult placed and she will consider completing this admission.   Questions and concerns were addressed. PMT contact information given. Spiritual support provided.    SUMMARY OF RECOMMENDATIONS    Patient confirms DNR/DNI decision. Otherwise full scope treatment.   Patient agreeable to start hemodialysis. Nephrology, vascular surgery, and IR following.   MOST form completed 07/2019. Reviewed with patient. Decisions include: DNR/DNI, limited additional interventions including hospitalization when necessary, IVF/ABX if indicated, NO feeding tube.  Spiritual care consult. Patient interested in completing AD packet to designate HCPOA.   May benefit from outpatient palliative referral.   Code Status/Advance Care Planning:  DNR/DNI per patient wishes. Reviewed previously documented MOST form in EMR.  Symptom Management:   Per attending  Palliative Prophylaxis:   Bowel Regimen, Frequent Pain Assessment and Turn Reposition  Psycho-social/Spiritual:   Desire for further Chaplaincy support: yes  Additional Recommendations: Caregiving  Support/Resources  Prognosis:   Unable to determine  Discharge Planning: To Be Determined      Primary Diagnoses: Present on Admission: . Hypertensive crisis   I have reviewed the medical  record, interviewed the patient and family, and examined the patient. The following aspects are pertinent.  Past Medical History:  Diagnosis Date  . Anemia   . Anxiety   . Bipolar disorder ([place])   . Depression   . GERD (gastroesophageal reflux disease)   . History of blood transfusion   . Hypertension   . IUD    HISTORY OF IUD --REMOVED IN 2006  .  Seizures ([place]) [id number]   two seizures due to a med changes   Social History   Socioeconomic History  . Marital status: Single    Spouse name: Not on file  . Number of children: 0  . Years of education: 76  . Highest education level: Not on file  Occupational History  . Occupation: Retired  Tobacco Use  . Smoking status: Former Smoker    Packs/day: 0.25    Years: 4.00    Pack years: 1.00    Types: Cigarettes    Quit date: 03/21/1988    Years since quitting: 31.7  . Smokeless tobacco: Never Used  Vaping Use  . Vaping Use: Never used  Substance and Sexual Activity  . Alcohol use: Yes    Comment: 1-2 times a year.   . Drug use: No  . Sexual activity: Not Currently    Birth control/protection: I.U.D.  Other Topics Concern  . Not on file  Social History Narrative   Fun: Puzzles, watch mystery shows, read   Denies religious beliefs effecting health care.       Pt lives in 2 story home with her sister-in-law and niece   Some college education   Retired Scientist, research (medical)    Social Determinants of Radio broadcast assistant Strain:   . Difficulty of Paying Living Expenses: Not on file  Food Insecurity:   . Worried About Charity fundraiser in the Last Year: Not on file  . Ran Out of Food in the Last Year: Not on file  Transportation Needs:   . Lack of Transportation (Medical): Not on file  . Lack of Transportation (Non-Medical): Not on file  Physical Activity:   . Days of Exercise per Week: Not on file  . Minutes of Exercise per Session: Not on file  Stress:   . Feeling of Stress : Not on file  Social Connections:   . Frequency of Communication with Friends and Family: Not on file  . Frequency of Social Gatherings with Friends and Family: Not on file  . Attends Religious Services: Not on file  . Active Member of Clubs or Organizations: Not on file  . Attends Archivist Meetings: Not on file  . Marital Status: Not on file   Family History  Problem Relation  Age of Onset  . Hypertension Mother   . Kidney failure Mother   . Hypertension Father   . Diabetes Cousin   . Diabetes Sister   . Hypertension Sister   . Kidney disease Sister        dialysis  . Hypertension Brother   . Heart attack Brother   . Hypertension Maternal Aunt   . Hypertension Maternal Uncle   . Hypertension Maternal Grandmother   . Hypertension Maternal Grandfather   . Hypertension Paternal Grandmother   . Hypertension Paternal Grandfather   . Heart attack Brother   . Hypertension Brother   . Hypertension Sister    Scheduled Meds: . amLODipine  10 mg Oral Daily  . aspirin EC  81 mg Oral Q breakfast  .  calcitRIOL  1 mcg Oral Daily  . calcium carbonate  2 tablet Oral BID WC  . Chlorhexidine Gluconate Cloth  6 each Topical Daily  . [START ON 12/10/2019] Chlorhexidine Gluconate Cloth  6 each Topical Q0600  . heparin  5,000 Units Subcutaneous Q8H  . hydrOXYzine  25 mg Oral BID  . labetalol  200 mg Oral BID  . levETIRAcetam  500 mg Oral Daily  . pantoprazole  40 mg Oral Daily  . sevelamer carbonate  800 mg Oral TID WC  . sodium bicarbonate  1,300 mg Oral TID  . sodium chloride flush  3 mL Intravenous Q12H  . venlafaxine XR  150 mg Oral Q breakfast   Continuous Infusions: . sodium chloride    . ferric gluconate (FERRLECIT/NULECIT) IV Stopped (12/09/19 1025)  . furosemide Stopped (12/09/19 1634)   PRN Meds:.sodium chloride, acetaminophen, calcium carbonate, hydrALAZINE, HYDROmorphone (DILAUDID) injection, promethazine, sodium chloride flush, temazepam, tiZANidine Medications Prior to Admission:  Prior to Admission medications   Medication Sig Start Date End Date Taking? Authorizing Provider  acetaminophen (TYLENOL 8 HOUR) 650 MG CR tablet Take 650 mg by mouth every 8 (eight) hours as needed for pain.   Yes [provider]  aspirin EC 81 MG tablet Take 1 tablet (81 mg total) by mouth daily with breakfast. 07/25/19  Yes Emokpae, Courage, MD  calcitRIOL  (ROCALTROL) 0.5 MCG capsule Take 1 mcg by mouth daily. 11/21/19  Yes [provider]  calcium carbonate (OS-CAL - DOSED IN MG OF ELEMENTAL CALCIUM) 1250 (500 Ca) MG tablet Take 2 tablets (1,000 mg of elemental calcium total) by mouth 2 (two) times daily with a meal. 07/25/19  Yes Emokpae, Courage, MD  carvedilol (COREG) 25 MG tablet Take 1 tablet (25 mg total) by mouth 2 (two) times daily with a meal. 07/25/19  Yes Emokpae, Courage, MD  epoetin alfa-epbx (RETACRIT) 98338 UNIT/ML injection 20,000 Units every 30 (thirty) days.   Yes Madelon Lips, MD  furosemide (LASIX) 80 MG tablet Take 1 tablet (80 mg total) by mouth daily. 07/25/19  Yes Emokpae, Courage, MD  hydrALAZINE (APRESOLINE) 25 MG tablet Take 25 mg by mouth 2 (two) times daily. 11/21/19  Yes [provider]  hydrOXYzine (ATARAX/VISTARIL) 25 MG tablet Take 1 tablet (25 mg total) by mouth 2 (two) times daily. 07/25/19  Yes Emokpae, Courage, MD  levETIRAcetam (KEPPRA XR) 500 MG 24 hr tablet Take 1 tablet (500 mg total) by mouth daily. 08/02/19  Yes Cameron Sprang, MD  promethazine (PHENERGAN) 25 MG tablet Take 25 mg by mouth every 6 (six) hours as needed. 01/01/19  Yes [provider]  sodium bicarbonate 650 MG tablet Take 1 tablet (650 mg total) by mouth 2 (two) times daily. 07/25/19  Yes Emokpae, Courage, MD  tiZANidine (ZANAFLEX) 4 MG tablet Take 1 tablet (4 mg total) by mouth 2 (two) times daily as needed. TAKE 1 TABLET BY MOUTH EVERY 12 HOURS AS NEEDED FOR SPASM 11/12/19 12/12/19 Yes Carole Civil, MD  traMADol (ULTRAM) 50 MG tablet TAKE 1 TABLET BY MOUTH EVERY SIX HOURS AS NEEDED. Patient taking differently: Take 50 mg by mouth every 6 (six) hours as needed for moderate pain.  10/29/19  Yes Sanjuana Kava, MD  venlafaxine XR (EFFEXOR-XR) 150 MG 24 hr capsule Take 150 mg by mouth daily with breakfast.   Yes [provider]   Allergies  Allergen Reactions  . Ambien [Zolpidem Tartrate]     "BLACK OUT", SLEEP  DRIVING  . Geodon [Ziprasidone  Hcl]     Seizure activity  . Lactulose Itching  . Ondansetron Nausea And Vomiting  . Quetiapine     Other reaction(s): Other (See Comments) Possible Hair Loss  . Sulfa Antibiotics Rash  . Zolpidem Rash   Review of Systems  Constitutional: Positive for activity change.  Gastrointestinal: Positive for nausea.   Physical Exam Vitals and nursing note reviewed.  Constitutional:      General: She is awake.  Cardiovascular:     Rate and Rhythm: Normal rate.  Pulmonary:     Effort: No tachypnea, accessory muscle usage or respiratory distress.  Skin:    General: Skin is warm and dry.  Neurological:     Mental Status: She is alert and oriented to person, place, and time.  Psychiatric:        Mood and Affect: Mood normal.        Speech: Speech normal.        Behavior: Behavior normal.        Cognition and Memory: Cognition normal.    Vital Signs: BP (!) 146/101   Pulse 98   Temp 98.8 F (37.1 C) (Oral)   Resp (!) 23   Ht 4' 11"  (1.499 m)   Wt 65.8 kg   SpO2 100%   BMI 29.30 kg/m  Pain Scale: 0-10 POSS *See Group Information*: 1-Acceptable,Awake and alert Pain Score: 2    SpO2: SpO2: 100 % O2 Device:SpO2: 100 % O2 Flow Rate: .   IO: Intake/output summary:   Intake/Output Summary (Last 24 hours) at 12/09/2019 1646 Last data filed at 12/09/2019 1634 Gross per 24 hour  Intake 160.64 ml  Output 2300 ml  Net -2139.36 ml    LBM: Last BM Date: 12/09/19 Baseline Weight: Weight: 70.3 kg Most recent weight: Weight: 65.8 kg     Palliative Assessment/Data: PPS 50%   Flowsheet Rows     Most Recent Value  Intake Tab  Referral Department Hospitalist  Unit at Time of Referral ICU  Palliative Care Primary Diagnosis Nephrology  Palliative Care Type Return patient Palliative Care  Reason for referral Clarify Goals of Care  Date first seen by Palliative Care 12/09/19  Clinical Assessment  Palliative Performance Scale Score 50%  Psychosocial  & Spiritual Assessment  Palliative Care Outcomes  Patient/Family meeting held? Yes  Who was at the meeting? patient  Palliative Care Outcomes Clarified goals of care, ACP counseling assistance, Provided psychosocial or spiritual support      Time Total: 60 Greater than 50%  of this time was spent counseling and coordinating care related to the above assessment and plan.  Signed by:  Ihor Dow, DNP, FNP-C Palliative Medicine Team  Phone: [phone] Fax: [phone]   Please contact Palliative Medicine Team phone at [phone] for questions and concerns.  For individual provider: See Shea Evans

## 2019-12-09 NOTE — H&P (View-Only) (Signed)
[facility] [place]  Patient name: [name] MRN: [id number] DOB: 10-Aug-1965 Sex: female    HPI: [name] is a 54 y.o. female seen to discuss access for hemodialysis.  She has a long history of chronic renal insufficiency and is now progressed to need for hemodialysis.  Was admitted with hypertensive crisis.  She has multiple family members to include father, brother and sister with end-stage renal disease as she is familiar with hemodialysis.  She has had no prior access.  Has no history of pacemaker.  She is right-handed.  Past Medical History:  Diagnosis Date  . Anemia   . Anxiety   . Bipolar disorder ([place])   . Depression   . GERD (gastroesophageal reflux disease)   . History of blood transfusion   . Hypertension   . IUD    HISTORY OF IUD --REMOVED IN 2006  . Seizures ([place]) 7948,0165   two seizures due to a med changes    Family History  Problem Relation Age of Onset  . Hypertension Mother   . Kidney failure Mother   . Hypertension Father   . Diabetes Cousin   . Diabetes Sister   . Hypertension Sister   . Kidney disease Sister        dialysis  . Hypertension Brother   . Heart attack Brother   . Hypertension Maternal Aunt   . Hypertension Maternal Uncle   . Hypertension Maternal Grandmother   . Hypertension Maternal Grandfather   . Hypertension Paternal Grandmother   . Hypertension Paternal Grandfather   . Heart attack Brother   . Hypertension Brother   . Hypertension Sister     SOCIAL HISTORY: Social History   Tobacco Use  . Smoking status: Former Smoker    Packs/day: 0.25    Years: 4.00    Pack years: 1.00    Types: Cigarettes    Quit date: 03/21/1988    Years since quitting: 31.7  . Smokeless tobacco: Never Used  Substance Use Topics  . Alcohol use: Yes    Comment: 1-2 times a year.     Allergies  Allergen Reactions  . Ambien [Zolpidem Tartrate]     "BLACK OUT", SLEEP  DRIVING  . Geodon [Ziprasidone Hcl]     Seizure activity  . Lactulose Itching  . Ondansetron Nausea And Vomiting  . Quetiapine     Other reaction(s): Other (See Comments) Possible Hair Loss  . Sulfa Antibiotics Rash  . Zolpidem Rash    Current Facility-Administered Medications  Medication Dose Route Frequency Provider Last Rate Last Admin  . 0.9 %  sodium chloride infusion  250 mL Intravenous PRN Barton Dubois, MD      . acetaminophen (TYLENOL) tablet 650 mg  650 mg Oral Q8H PRN Barton Dubois, MD   650 mg at 12/08/19 1309  . amLODipine (NORVASC) tablet 10 mg  10 mg Oral Daily Pearson Grippe B, MD   10 mg at 12/09/19 1104  . aspirin EC tablet 81 mg  81 mg Oral Q breakfast Barton Dubois, MD   81 mg at 12/09/19 0825  . calcitRIOL (ROCALTROL) capsule 1 mcg  1 mcg Oral Daily Barton Dubois, MD   1 mcg at 12/09/19 0900  . calcium carbonate (OS-CAL - dosed in mg of elemental calcium) tablet 1,000 mg of elemental calcium  2 tablet Oral BID WC Barton Dubois, MD   1,000 mg of elemental calcium at 12/09/19 0833  . calcium carbonate (TUMS - dosed  in mg elemental calcium) chewable tablet 200 mg of elemental calcium  1 tablet Oral Q12H PRN Barton Dubois, MD   200 mg of elemental calcium at 12/07/19 1424  . Chlorhexidine Gluconate Cloth 2 % PADS 6 each  6 each Topical Daily Barton Dubois, MD   6 each at 12/09/19 [phone]  . [START ON 12/10/2019] Chlorhexidine Gluconate Cloth 2 % PADS 6 each  6 each Topical Q0600 Pearson Grippe B, MD      . ferric gluconate (NULECIT) 125 mg in sodium chloride 0.9 % 100 mL IVPB  125 mg Intravenous Daily Reesa Chew, MD 110 mL/hr at 12/09/19 0911 125 mg at 12/09/19 0911  . furosemide (LASIX) 120 mg in dextrose 5 % 50 mL IVPB  120 mg Intravenous BID Reesa Chew, MD 62 mL/hr at 12/09/19 0536 120 mg at 12/09/19 0536  . heparin injection 5,000 Units  5,000 Units Subcutaneous Q8H Barton Dubois, MD   5,000 Units at 12/09/19 0535  . hydrALAZINE (APRESOLINE) injection 10  mg  10 mg Intravenous Q6H PRN Barton Dubois, MD   10 mg at 12/09/19 0829  . HYDROmorphone (DILAUDID) injection 1 mg  1 mg Intravenous Q4H PRN Barton Dubois, MD   1 mg at 12/09/19 5885  . hydrOXYzine (ATARAX/VISTARIL) tablet 25 mg  25 mg Oral BID Barton Dubois, MD   25 mg at 12/09/19 0900  . labetalol (NORMODYNE) tablet 200 mg  200 mg Oral BID Barton Dubois, MD   200 mg at 12/09/19 0900  . levETIRAcetam (KEPPRA XR) 24 hr tablet 500 mg  500 mg Oral Daily Barton Dubois, MD   500 mg at 12/09/19 0904  . pantoprazole (PROTONIX) EC tablet 40 mg  40 mg Oral Daily Barton Dubois, MD   40 mg at 12/09/19 0900  . promethazine (PHENERGAN) injection 12.5 mg  12.5 mg Intravenous Q6H PRN Barton Dubois, MD   12.5 mg at 12/08/19 1018  . sevelamer carbonate (RENVELA) tablet 800 mg  800 mg Oral TID WC Reesa Chew, MD   800 mg at 12/09/19 1105  . sodium bicarbonate tablet 1,300 mg  1,300 mg Oral TID Reesa Chew, MD   1,300 mg at 12/09/19 0900  . sodium chloride flush (NS) 0.9 % injection 3 mL  3 mL Intravenous Q12H Barton Dubois, MD   3 mL at 12/09/19 0834  . sodium chloride flush (NS) 0.9 % injection 3 mL  3 mL Intravenous PRN Barton Dubois, MD      . temazepam (RESTORIL) capsule 15 mg  15 mg Oral QHS PRN Barton Dubois, MD   15 mg at 12/08/19 2135  . tiZANidine (ZANAFLEX) tablet 4 mg  4 mg Oral Q12H PRN Barton Dubois, MD      . venlafaxine XR (EFFEXOR-XR) 24 hr capsule 150 mg  150 mg Oral Q breakfast Barton Dubois, MD   150 mg at 12/09/19 0277    REVIEW OF SYSTEMS:  Reviewed in her history and physical with nothing to add PHYSICAL EXAM: Vitals:   12/09/19 0900 12/09/19 1000 12/09/19 1104 12/09/19 1135  BP: (!) 171/95 (!) 162/89 (!) 161/94   Pulse: 92 94  96  Resp: 19 16  20   Temp:    98.2 F (36.8 C)  TempSrc:    Oral  SpO2: 100% 97%  93%  Weight:      Height:        GENERAL: The patient is a well-nourished female, in no acute distress. The vital signs are documented  above. CARDIOVASCULAR: 2+ radial pulses bilaterally.  Small surface veins on the left.  Does have a patent antecubital vein with multiple prior vena punctures on the left.  Has an IV in the right. PULMONARY: There is good air exchange  MUSCULOSKELETAL: There are no major deformities or cyanosis. NEUROLOGIC: No focal weakness or paresthesias are detected. SKIN: There are no ulcers or rashes noted. PSYCHIATRIC: The patient has a normal affect.  DATA:  Vein map pending  MEDICAL ISSUES: I had long discussion with the patient regarding options for hemodialysis access.  I discussed tunnel catheter.  She is to have a tunnel catheter placed by interventional radiology.  Also discussed AV graft versus AV fistula.  It appears as though she does have a adequate antecubital vein on the left for AV fistula creation assuming it is patent proximally.  We will plan on surgery on 12/12/2019 at [facility] with AV fistula versus graft.  We will plan on left arm and less vein map suggest better alternative.    Rosetta Posner, MD FACS [facility] Tel [phone] Pager [phone]

## 2019-12-09 NOTE — Progress Notes (Addendum)
Nephrology Follow-Up Consult note   Assessment/Recommendations: [name] is a/an 54 y.o. female with a past medical history significant for CKD, anemia, seizure disorder, HTN, GERD, depression who present w/ HTN urgency and volume overload  AKI on CKD: Longstanding CKD w/ recent BL ~5 but fluctuates. Now ESRD and pt is accepting of starting HD.    Will arrange for [facility] with surgery or IR  - Tomorrow 9/21 at [facility]  Will contact vascular surgery for permanent access -- Vein mapping ordered. Plan for AVF 9/23 at [facility].   First HD following placement of [facility] likley 9/21  Continue diuretics at the current time  Will need to CLIP to [facility]  Volume Status: Improving, Cont diuretics pending start of HD  Hypertension: BP elevated on labetalol.  Add amlodipine.   Secondary hyperparathyroidism: On calcitriol 40mcg daily.  Started sevelamer 800mg  TID on 9/19. Trend with HD initiation  Anemia due to CKD: Hemoglobin 8.8.  Iron sat 25 and ferritin 49 on 11/22/2019. Dosed feraheme x1 and will likley need Aranesp.   Anion gap metabolic acidosis: Cont NaHCO3 until HD started.    [facility] 12/09/2019 9:48 AM  ___________________________________________________________  CC: AKI on CKD5  Interval History/Subjective:   She is ready to start dialysis  She is aware of need for [facility] and permanent AVF  No morning labs resulted  2.6 L urine output yesterday, -3.2 L from admission, 5 kg down from admission.  Remains on room air.  BP is elevated, on hydralazine as needed and labetalol.    Medications:  Current Facility-Administered Medications  Medication Dose Route Frequency Provider Last Rate Last Admin  . 0.9 %  sodium chloride infusion  250 mL Intravenous PRN Barton Dubois, MD      . acetaminophen (TYLENOL) tablet 650 mg  650 mg Oral Q8H PRN Barton Dubois, MD   650 mg at 12/08/19 1309  . aspirin EC tablet 81 mg  81 mg Oral Q breakfast Barton Dubois, MD   81 mg at 12/09/19 0825  . calcitRIOL (ROCALTROL) capsule 1 mcg  1 mcg Oral Daily Barton Dubois, MD   1 mcg at 12/09/19 0900  . calcium carbonate (OS-CAL - dosed in mg of elemental calcium) tablet 1,000 mg of elemental calcium  2 tablet Oral BID WC Barton Dubois, MD   1,000 mg of elemental calcium at 12/09/19 0833  . calcium carbonate (TUMS - dosed in mg elemental calcium) chewable tablet 200 mg of elemental calcium  1 tablet Oral Q12H PRN Barton Dubois, MD   200 mg of elemental calcium at 12/07/19 1424  . Chlorhexidine Gluconate Cloth 2 % PADS 6 each  6 each Topical Daily Barton Dubois, MD   6 each at 12/09/19 [phone]  . ferric gluconate (NULECIT) 125 mg in sodium chloride 0.9 % 100 mL IVPB  125 mg Intravenous Daily Reesa Chew, MD 110 mL/hr at 12/09/19 0911 125 mg at 12/09/19 0911  . furosemide (LASIX) 120 mg in dextrose 5 % 50 mL IVPB  120 mg Intravenous BID Reesa Chew, MD 62 mL/hr at 12/09/19 0536 120 mg at 12/09/19 0536  . heparin injection 5,000 Units  5,000 Units Subcutaneous Q8H Barton Dubois, MD   5,000 Units at 12/09/19 0535  . hydrALAZINE (APRESOLINE) injection 10 mg  10 mg Intravenous Q6H PRN Barton Dubois, MD   10 mg at 12/09/19 0829  . HYDROmorphone (DILAUDID) injection 1 mg  1 mg Intravenous Q4H PRN Barton Dubois, MD  1 mg at 12/09/19 4332  . hydrOXYzine (ATARAX/VISTARIL) tablet 25 mg  25 mg Oral BID Barton Dubois, MD   25 mg at 12/09/19 0900  . labetalol (NORMODYNE) tablet 200 mg  200 mg Oral BID Barton Dubois, MD   200 mg at 12/09/19 0900  . levETIRAcetam (KEPPRA XR) 24 hr tablet 500 mg  500 mg Oral Daily Barton Dubois, MD   500 mg at 12/09/19 0904  . nicardipine (CARDENE) 20mg  in 0.86% saline 21ml IV infusion (0.1 mg/ml)  3-15 mg/hr Intravenous Continuous Rolland Porter, MD   Stopped at 12/08/19 1123  . pantoprazole (PROTONIX) EC tablet 40 mg  40 mg Oral Daily Barton Dubois, MD   40 mg at 12/09/19 0900  . promethazine (PHENERGAN) injection 12.5 mg  12.5  mg Intravenous Q6H PRN Barton Dubois, MD   12.5 mg at 12/08/19 1018  . sevelamer carbonate (RENVELA) tablet 800 mg  800 mg Oral TID WC Reesa Chew, MD   800 mg at 12/09/19 9518  . sodium bicarbonate tablet 1,300 mg  1,300 mg Oral TID Reesa Chew, MD   1,300 mg at 12/09/19 0900  . sodium chloride flush (NS) 0.9 % injection 3 mL  3 mL Intravenous Q12H Barton Dubois, MD   3 mL at 12/09/19 0834  . sodium chloride flush (NS) 0.9 % injection 3 mL  3 mL Intravenous PRN Barton Dubois, MD      . temazepam (RESTORIL) capsule 15 mg  15 mg Oral QHS PRN Barton Dubois, MD   15 mg at 12/08/19 2135  . tiZANidine (ZANAFLEX) tablet 4 mg  4 mg Oral Q12H PRN Barton Dubois, MD      . venlafaxine XR (EFFEXOR-XR) 24 hr capsule 150 mg  150 mg Oral Q breakfast Barton Dubois, MD   150 mg at 12/09/19 8416      Review of Systems: 10 systems reviewed and negative except per interval history/subjective  Physical Exam: Vitals:   12/09/19 0713 12/09/19 0800  BP:  (!) 181/106  Pulse: 91 90  Resp: 15 (!) 9  Temp: 99.1 F (37.3 C)   SpO2: 98% 100%   No intake/output data recorded.  Intake/Output Summary (Last 24 hours) at 12/09/2019 0948 Last data filed at 12/09/2019 0500 Gross per 24 hour  Intake 512.25 ml  Output 2550 ml  Net -2037.75 ml   Constitutional: well-appearing, no acute distress ENMT: ears and nose without scars or lesions, MMM CV: normal rate, no edema in lower extremities Respiratory: bilateral chest rise, normal work of breathing Gastrointestinal: soft, non-tender, no palpable masses or hernias Skin: no visible lesions or rashes Psych: alert, judgement/insight appropriate, appropriate mood and affect   Test Results I personally reviewed new and old clinical labs and radiology tests Lab Results  Component Value Date   NA 140 12/08/2019   K 3.9 12/08/2019   CL 107 12/08/2019   CO2 17 (L) 12/08/2019   BUN 76 (H) 12/08/2019   CREATININE 8.04 (H) 12/08/2019   GFR 30.27 (L)  02/15/2017   CALCIUM 8.6 (L) 12/08/2019   ALBUMIN 3.6 12/08/2019   PHOS 8.6 (H) 12/08/2019

## 2019-12-09 NOTE — Consult Note (Signed)
[facility] [place]  Patient name: [name] MRN: [id number] DOB: [birth year] Sex: female    HPI: [name] is a 54 y.o. female seen to discuss access for hemodialysis.  She has a long history of chronic renal insufficiency and is now progressed to need for hemodialysis.  Was admitted with hypertensive crisis.  She has multiple family members to include father, brother and sister with end-stage renal disease as she is familiar with hemodialysis.  She has had no prior access.  Has no history of pacemaker.  She is right-handed.  Past Medical History:  Diagnosis Date  . Anemia   . Anxiety   . Bipolar disorder ([place])   . Depression   . GERD (gastroesophageal reflux disease)   . History of blood transfusion   . Hypertension   . IUD    HISTORY OF IUD --REMOVED IN 2006  . Seizures ([place]) 2751,7001   two seizures due to a med changes    Family History  Problem Relation Age of Onset  . Hypertension Mother   . Kidney failure Mother   . Hypertension Father   . Diabetes Cousin   . Diabetes Sister   . Hypertension Sister   . Kidney disease Sister        dialysis  . Hypertension Brother   . Heart attack Brother   . Hypertension Maternal Aunt   . Hypertension Maternal Uncle   . Hypertension Maternal Grandmother   . Hypertension Maternal Grandfather   . Hypertension Paternal Grandmother   . Hypertension Paternal Grandfather   . Heart attack Brother   . Hypertension Brother   . Hypertension Sister     SOCIAL HISTORY: Social History   Tobacco Use  . Smoking status: Former Smoker    Packs/day: 0.25    Years: 4.00    Pack years: 1.00    Types: Cigarettes    Quit date: 03/21/1988    Years since quitting: 31.7  . Smokeless tobacco: Never Used  Substance Use Topics  . Alcohol use: Yes    Comment: 1-2 times a year.     Allergies  Allergen Reactions  . Ambien [Zolpidem Tartrate]     "BLACK OUT", SLEEP  DRIVING  . Geodon [Ziprasidone Hcl]     Seizure activity  . Lactulose Itching  . Ondansetron Nausea And Vomiting  . Quetiapine     Other reaction(s): Other (See Comments) Possible Hair Loss  . Sulfa Antibiotics Rash  . Zolpidem Rash    Current Facility-Administered Medications  Medication Dose Route Frequency Provider Last Rate Last Admin  . 0.9 %  sodium chloride infusion  250 mL Intravenous PRN Barton Dubois, MD      . acetaminophen (TYLENOL) tablet 650 mg  650 mg Oral Q8H PRN Barton Dubois, MD   650 mg at 12/08/19 1309  . amLODipine (NORVASC) tablet 10 mg  10 mg Oral Daily Pearson Grippe B, MD   10 mg at 12/09/19 1104  . aspirin EC tablet 81 mg  81 mg Oral Q breakfast Barton Dubois, MD   81 mg at 12/09/19 0825  . calcitRIOL (ROCALTROL) capsule 1 mcg  1 mcg Oral Daily Barton Dubois, MD   1 mcg at 12/09/19 0900  . calcium carbonate (OS-CAL - dosed in mg of elemental calcium) tablet 1,000 mg of elemental calcium  2 tablet Oral BID WC Barton Dubois, MD   1,000 mg of elemental calcium at 12/09/19 0833  . calcium carbonate (TUMS - dosed  in mg elemental calcium) chewable tablet 200 mg of elemental calcium  1 tablet Oral Q12H PRN Barton Dubois, MD   200 mg of elemental calcium at 12/07/19 1424  . Chlorhexidine Gluconate Cloth 2 % PADS 6 each  6 each Topical Daily Barton Dubois, MD   6 each at 12/09/19 [phone]  . [START ON 12/10/2019] Chlorhexidine Gluconate Cloth 2 % PADS 6 each  6 each Topical Q0600 Pearson Grippe B, MD      . ferric gluconate (NULECIT) 125 mg in sodium chloride 0.9 % 100 mL IVPB  125 mg Intravenous Daily Reesa Chew, MD 110 mL/hr at 12/09/19 0911 125 mg at 12/09/19 0911  . furosemide (LASIX) 120 mg in dextrose 5 % 50 mL IVPB  120 mg Intravenous BID Reesa Chew, MD 62 mL/hr at 12/09/19 0536 120 mg at 12/09/19 0536  . heparin injection 5,000 Units  5,000 Units Subcutaneous Q8H Barton Dubois, MD   5,000 Units at 12/09/19 0535  . hydrALAZINE (APRESOLINE) injection 10  mg  10 mg Intravenous Q6H PRN Barton Dubois, MD   10 mg at 12/09/19 0829  . HYDROmorphone (DILAUDID) injection 1 mg  1 mg Intravenous Q4H PRN Barton Dubois, MD   1 mg at 12/09/19 0174  . hydrOXYzine (ATARAX/VISTARIL) tablet 25 mg  25 mg Oral BID Barton Dubois, MD   25 mg at 12/09/19 0900  . labetalol (NORMODYNE) tablet 200 mg  200 mg Oral BID Barton Dubois, MD   200 mg at 12/09/19 0900  . levETIRAcetam (KEPPRA XR) 24 hr tablet 500 mg  500 mg Oral Daily Barton Dubois, MD   500 mg at 12/09/19 0904  . pantoprazole (PROTONIX) EC tablet 40 mg  40 mg Oral Daily Barton Dubois, MD   40 mg at 12/09/19 0900  . promethazine (PHENERGAN) injection 12.5 mg  12.5 mg Intravenous Q6H PRN Barton Dubois, MD   12.5 mg at 12/08/19 1018  . sevelamer carbonate (RENVELA) tablet 800 mg  800 mg Oral TID WC Reesa Chew, MD   800 mg at 12/09/19 1105  . sodium bicarbonate tablet 1,300 mg  1,300 mg Oral TID Reesa Chew, MD   1,300 mg at 12/09/19 0900  . sodium chloride flush (NS) 0.9 % injection 3 mL  3 mL Intravenous Q12H Barton Dubois, MD   3 mL at 12/09/19 0834  . sodium chloride flush (NS) 0.9 % injection 3 mL  3 mL Intravenous PRN Barton Dubois, MD      . temazepam (RESTORIL) capsule 15 mg  15 mg Oral QHS PRN Barton Dubois, MD   15 mg at 12/08/19 2135  . tiZANidine (ZANAFLEX) tablet 4 mg  4 mg Oral Q12H PRN Barton Dubois, MD      . venlafaxine XR (EFFEXOR-XR) 24 hr capsule 150 mg  150 mg Oral Q breakfast Barton Dubois, MD   150 mg at 12/09/19 9449    REVIEW OF SYSTEMS:  Reviewed in her history and physical with nothing to add PHYSICAL EXAM: Vitals:   12/09/19 0900 12/09/19 1000 12/09/19 1104 12/09/19 1135  BP: (!) 171/95 (!) 162/89 (!) 161/94   Pulse: 92 94  96  Resp: 19 16  20   Temp:    98.2 F (36.8 C)  TempSrc:    Oral  SpO2: 100% 97%  93%  Weight:      Height:        GENERAL: The patient is a well-nourished female, in no acute distress. The vital signs are documented  above. CARDIOVASCULAR: 2+ radial pulses bilaterally.  Small surface veins on the left.  Does have a patent antecubital vein with multiple prior vena punctures on the left.  Has an IV in the right. PULMONARY: There is good air exchange  MUSCULOSKELETAL: There are no major deformities or cyanosis. NEUROLOGIC: No focal weakness or paresthesias are detected. SKIN: There are no ulcers or rashes noted. PSYCHIATRIC: The patient has a normal affect.  DATA:  Vein map pending  MEDICAL ISSUES: I had long discussion with the patient regarding options for hemodialysis access.  I discussed tunnel catheter.  She is to have a tunnel catheter placed by interventional radiology.  Also discussed AV graft versus AV fistula.  It appears as though she does have a adequate antecubital vein on the left for AV fistula creation assuming it is patent proximally.  We will plan on surgery on 12/12/2019 at [facility] with AV fistula versus graft.  We will plan on left arm and less vein map suggest better alternative.    Rosetta Posner, MD FACS [facility] Tel [phone] Pager [phone]

## 2019-12-09 NOTE — Progress Notes (Signed)
Patient ID: [name], female   DOB: 04-14-1965, 54 y.o.   MRN: [id number]   Pt is scheduled for IR procedure 9/21 Placement of tunneled dialysis catheter per Dr Joelyn Oms  Pt is to be at [facility] by 10000 am via ambulance RN aware  Pt will return to [facility] after procedure  See all orders

## 2019-12-09 NOTE — TOC Progression Note (Signed)
Transition of Care [facility]) - Progression Note    Patient Details  Name: [name] MRN: [id number] Date of Birth: [birth year]  Transition of Care [facility]) CM/SW Contact  Boneta Lucks, RN Phone Number: 12/09/2019, 2:48 PM  Clinical Narrative:   Patient in need of new Dialysis chair out patient set up. TOC spoke with MD, requested set up with [facility] on [facility]. In [place].  New patient forms complete and clinicals faxed. TOC to follow.     Expected Discharge Plan: [place] Barriers to Discharge: Continued Medical Work up  Expected Discharge Plan and Services Expected Discharge Plan: [place] arrangements for the past 2 months: Apartment

## 2019-12-09 NOTE — Progress Notes (Signed)
PROGRESS NOTE    [name]  GNO:[id number] DOB: 1965-08-28 DOA: 12/07/2019 PCP: Pixie Casino, MD    Chief Complaint  Patient presents with  . Hypertension    Brief Narrative:  [name] is a 54 y.o. female with medical history significant of with a past medical history significant for anemia of chronic disease, seizure disorders, hypertension, gastroesophageal flux disease, bipolar disorder/depression and end-stage renal disease (refusing hemodialysis); who presented to the hospital secondary to headaches, nausea and shortness of breath on exertion.  Patient reports symptom has been present for the last 3-4 days and worsening.  She denies any fever, hematemesis, hematuria, melena, hematochezia, abdominal pain, blurred vision, focal weakness or any other complaints.  Reports to be compliant with her medications no recent changes to them.  No sick contacts.  ED Course: Patient found in hypertensive crisis, chest x-ray demonstrating vascular congestion and with worsening in her renal function and BUN.  Labetalol x5 doses given throughout her ED stay and subsequently due to failure improvement in her blood pressure was started on Cardene drip.  CT scan of the head demonstrated no acute intracranial abnormalities.  For her vascular congestion 100 mg of Lasix times once given.  TRH consulted to admit patient for further evaluation and management.  Assessment & Plan: 1-hypertensive crisis -With worsening renal function and headaches as part of endorgan damage. -Patient's BP still fluctuating, but much better control.  Has been off Cardene drip for 24 hours now.   -nephrology service assisting with diuretics management -continue renal/low sodium diet  -patient expressed desired for HD; after discussing with nephrology service plan is for initiation during this hospitalization. -Patient will have dietetic placement tomorrow 12/10/2019; and that is anticipated intervention on 9/23 21  for creation of AV fistula versus graft by vascular surgery. -continue labetalol and PRN hydralazine  -no CP, no nausea vomiting. -Reports mild headache this morning.  2-nausea/vomiting -continue PRN antiemetics -continue PPI  3-end-stage renal disease -continue to follow recommendations by nephrology -Patient expressed interest in HD today. -will follow clinical response and decide initiation time.  4-history of seizure disorders -continue keppra.  -No seizure appreciated.  5-anemia of chronic kidney disease -iron and epogen therapy as per nephrology discretion   6-hypocalcemia -Continue calcitriol and elemental calcium supplementation. -follow electrolytes.   7-metabolic acidosis in the setting of renal failure -Continue sodium bicarb, dose adjusted for improvement in ongoing metabolic acidosis   8-depression -no SI or hallucinations -continue effexor   9-DNR/DNI -Long discussions with patient about future intervention and anticipated care -Patient has expressed her wishes for HD currently, nephrology to further discussed with her about and assist with decisions on initiation and access.  -expressed still not wanting artifical life support, shocking or intubation.  -Wishes will be respected. -Long-term prognosis without HD very poor.    DVT prophylaxis: heparin  Code Status: DNR  Family Communication: no family at bedside Disposition:   Status is: Inpatient  Dispo: The patient is from: home               Anticipated d/c is to: home               Anticipated d/c date is: To be determined              Patient currently not medically stable for discharge yet; still with mild SOB with activity and will require hemodialysis initiation.  After discussing with nephrology service planning for dialysis catheter placement on 12/12/19 vascular surgery is  planning for AV fistula versus graft. Will follow nephrology rec's, continue adjusting antihypertensive agents and  follow clinical response to hemodialysis initiation.    Consultants:   Nephrology service  Palliative care   Procedures:  See below for x-ray reports    Antimicrobials:  None    Subjective: Reports no nausea, no vomiting, no chest pain.  Mild headache earlier this morning.  Feeling better and breathing a whole lot easier.  Objective: Vitals:   12/09/19 0900 12/09/19 1000 12/09/19 1104 12/09/19 1135  BP: (!) 171/95 (!) 162/89 (!) 161/94   Pulse: 92 94  96  Resp: 19 16  20   Temp:    98.2 F (36.8 C)  TempSrc:    Oral  SpO2: 100% 97%  93%  Weight:      Height:        Intake/Output Summary (Last 24 hours) at 12/09/2019 1529 Last data filed at 12/09/2019 0500 Gross per 24 hour  Intake 110 ml  Output 1550 ml  Net -1440 ml   Filed Weights   12/07/19 0255 12/08/19 0500 12/09/19 0500  Weight: 70.3 kg 64.9 kg 65.8 kg    Examination: General exam: Alert, awake, oriented x 3, in no acute distress; reports mild headache this morning, no nausea, no vomiting, no chest pain.  Breathing much better and currently denying orthopnea. Respiratory system: Decreased breath sounds at the bases, no fine crackles, no wheezing.  Good oxygen saturation on room air. Cardiovascular system:RRR. No murmurs, rubs, gallops.  No JVD on exam. Gastrointestinal system: Abdomen is nondistended, soft and nontender. No organomegaly or masses felt. Normal bowel sounds heard. Central nervous system: Alert and oriented. No focal neurological deficits. Extremities: No cyanosis or clubbing; trace edema appreciated bilaterally. Skin: No rashes, no petechiae Psychiatry: Judgement and insight appear normal. Mood & affect appropriate.    Data Reviewed: I have personally reviewed following labs and imaging studies  CBC: Recent Labs  Lab 12/07/19 0335  WBC 4.5  NEUTROABS 3.2  HGB 8.8*  HCT 28.7*  MCV 93.8  PLT 017    Basic Metabolic Panel: Recent Labs  Lab 12/07/19 0335 12/07/19 0830  12/08/19 0548 12/09/19 1043  NA 137  --  140 138  K 4.4  --  3.9 3.9  CL 109  --  107 104  CO2 17*  --  17* 19*  GLUCOSE 103*  --  106* 81  BUN 76*  --  76* 77*  CREATININE 7.60*  --  8.04* 8.83*  CALCIUM 8.3*  --  8.6* 8.0*  MG  --  1.9  --   --   PHOS  --  7.2* 8.6* 8.4*    GFR: Estimated Creatinine Clearance: 6.1 mL/min (A) (by C-G formula based on SCr of 8.83 mg/dL (H)).  Liver Function Tests: Recent Labs  Lab 12/07/19 0335 12/08/19 0548 12/09/19 1043  AST 13*  --   --   ALT 13  --   --   ALKPHOS 76  --   --   BILITOT 0.6  --   --   PROT 7.1  --   --   ALBUMIN 3.8 3.6 3.5    CBG: No results for input(s): GLUCAP in the last 168 hours.   Recent Results (from the past 240 hour(s))  SARS Coronavirus 2 by RT PCR (hospital order, performed in [facility] hospital lab) Nasopharyngeal Nasopharyngeal Swab     Status: None   Collection Time: 12/07/19  6:37 AM   Specimen: Nasopharyngeal Swab  Result Value Ref Range Status   SARS Coronavirus 2 NEGATIVE NEGATIVE Final    Comment: (NOTE) SARS-CoV-2 target nucleic acids are NOT DETECTED.  The SARS-CoV-2 RNA is generally detectable in upper and lower respiratory specimens during the acute phase of infection. The lowest concentration of SARS-CoV-2 viral copies this assay can detect is 250 copies / mL. A negative result does not preclude SARS-CoV-2 infection and should not be used as the sole basis for treatment or other patient management decisions.  A negative result may occur with improper specimen collection / handling, submission of specimen other than nasopharyngeal swab, presence of viral mutation(s) within the areas targeted by this assay, and inadequate number of viral copies (<250 copies / mL). A negative result must be combined with clinical observations, patient history, and epidemiological information.  Fact Sheet for Patients:   StrictlyIdeas.no  Fact Sheet for Healthcare  Providers: BankingDealers.co.za  This test is not yet approved or  cleared by the [place] FDA and has been authorized for detection and/or diagnosis of SARS-CoV-2 by FDA under an Emergency Use Authorization (EUA).  This EUA will remain in effect (meaning this test can be used) for the duration of the COVID-19 declaration under Section 564(b)(1) of the Act, 21 U.S.C. section 360bbb-3(b)(1), unless the authorization is terminated or revoked sooner.  Performed at [facility], [address] [place], [place] [address]   MRSA PCR Screening     Status: None   Collection Time: 12/07/19 10:07 AM   Specimen: Nasal Mucosa; Nasopharyngeal  Result Value Ref Range Status   MRSA by PCR NEGATIVE NEGATIVE Final    Comment:        The GeneXpert MRSA Assay (FDA approved for NASAL specimens only), is one component of a comprehensive MRSA colonization surveillance program. It is not intended to diagnose MRSA infection nor to guide or monitor treatment for MRSA infections. Performed at [facility], [address], [place], [place] [address]       Radiology Studies: US RENAL  Result Date: 12/08/2019 CLINICAL DATA:  Acute renal insufficiency EXAM: RENAL / URINARY TRACT ULTRASOUND COMPLETE COMPARISON:  None. FINDINGS: Right Kidney: Renal measurements: 8.5 x 3.9 x 4.5 cm = volume: 79 mL. Contains a 1.6 cm cyst. Increased echogenicity. No hydronephrosis. Left Kidney: Renal measurements: 8.6 x 4.7 x 4.5 cm = volume: 95 mL. Contains a 1.4 cm cyst. Increased echogenicity. Bladder: Appears normal for degree of bladder distention. Other: None. IMPRESSION: 1. Bilateral renal cysts. 2. Increased echogenicity in both kidneys consistent with medical renal disease. Electronically Signed   By: Dorise Bullion III M.D   On: 12/08/2019 17:10    Scheduled Meds: . amLODipine  10 mg Oral Daily  . aspirin EC  81 mg Oral Q breakfast  . calcitRIOL  1 mcg Oral Daily  . calcium carbonate  2  tablet Oral BID WC  . Chlorhexidine Gluconate Cloth  6 each Topical Daily  . [START ON 12/10/2019] Chlorhexidine Gluconate Cloth  6 each Topical Q0600  . heparin  5,000 Units Subcutaneous Q8H  . hydrOXYzine  25 mg Oral BID  . labetalol  200 mg Oral BID  . levETIRAcetam  500 mg Oral Daily  . pantoprazole  40 mg Oral Daily  . sevelamer carbonate  800 mg Oral TID WC  . sodium bicarbonate  1,300 mg Oral TID  . sodium chloride flush  3 mL Intravenous Q12H  . venlafaxine XR  150 mg Oral Q breakfast   Continuous Infusions: . sodium chloride    .  ferric gluconate (FERRLECIT/NULECIT) IV 125 mg (12/09/19 0911)  . furosemide 120 mg (12/09/19 0536)     LOS: 2 days    Time spent: 30 minutes.    Barton Dubois, MD [facility]   To contact the attending provider between 7A-7P or the covering provider during after hours 7P-7A, please log into the web site [email or web address] and access using universal [place] password for that web site. If you do not have the password, please call the hospital operator.  12/09/2019, 3:29 PM

## 2019-12-10 ENCOUNTER — Ambulatory Visit: Payer: Medicare Other | Admitting: Orthopaedic Surgery

## 2019-12-10 ENCOUNTER — Inpatient Hospital Stay (HOSPITAL_COMMUNITY): Payer: Medicare Other

## 2019-12-10 HISTORY — PX: IR US GUIDE VASC ACCESS RIGHT: IMG2390

## 2019-12-10 HISTORY — PX: IR PERC TUN PERIT CATH WO PORT S&I /IMAG: IMG2327

## 2019-12-10 MED ORDER — FENTANYL CITRATE (PF) 100 MCG/2ML IJ SOLN
INTRAMUSCULAR | Status: AC | PRN
  Administered 2019-12-10: 50 ug via INTRAVENOUS

## 2019-12-10 MED ORDER — GELATIN ABSORBABLE 12-7 MM EX MISC
CUTANEOUS | Status: AC | PRN
  Administered 2019-12-10: 1 via TOPICAL

## 2019-12-10 MED ORDER — SODIUM CHLORIDE 0.9 % IV SOLN: 100.0000 mL | INTRAVENOUS | Status: DC | PRN

## 2019-12-10 MED ORDER — LIDOCAINE-EPINEPHRINE 1 %-1:100000 IJ SOLN
INTRAMUSCULAR | Status: AC | PRN
  Administered 2019-12-10: 20 mL via INTRADERMAL

## 2019-12-10 MED ORDER — HEPARIN SODIUM (PORCINE) 1000 UNIT/ML DIALYSIS: 1000.0000 [IU] | INTRAMUSCULAR | Status: DC | PRN

## 2019-12-10 MED ORDER — GELATIN ABSORBABLE 12-7 MM EX MISC
CUTANEOUS | Status: AC | PRN
  Administered 2019-12-10 (×2): 1 via TOPICAL

## 2019-12-10 MED ORDER — CHLORHEXIDINE GLUCONATE 4 % EX LIQD
CUTANEOUS | Status: AC | PRN
  Administered 2019-12-10: 1 via TOPICAL

## 2019-12-10 MED ORDER — MIDAZOLAM HCL 2 MG/2ML IJ SOLN
INTRAMUSCULAR | Status: AC | PRN
  Administered 2019-12-10: 1 mg via INTRAVENOUS
  Administered 2019-12-10: 0.5 mg via INTRAVENOUS

## 2019-12-10 MED ORDER — CEFAZOLIN SODIUM-DEXTROSE 2-4 GM/100ML-% IV SOLN
2.0000 g | Freq: Once | INTRAVENOUS | Status: AC
  Administered 2019-12-10: 2 g via INTRAVENOUS

## 2019-12-10 MED ORDER — ALTEPLASE 2 MG IJ SOLR
2.0000 mg | Freq: Once | INTRAMUSCULAR | Status: DC | PRN
  Filled 2019-12-10: qty 2

## 2019-12-10 MED ORDER — HEPARIN SODIUM (PORCINE) 1000 UNIT/ML IJ SOLN
INTRAMUSCULAR | Status: AC | PRN
  Administered 2019-12-10: 1000 [IU] via INTRAVENOUS

## 2019-12-10 NOTE — Progress Notes (Signed)
Patient ID: [name], female   DOB: 1965-12-11, 54 y.o.   MRN: [id number]  Progress Note    12/10/2019 11:16 AM * No surgery date entered *  Bilateral upper extremity arterial and venous noninvasive studies reviewed.  Normal arterial flow to the wrist bilaterally.  Acceptable cephalic and basilic vein on the left for fistula attempt   Vitals:   12/10/19 0830 12/10/19 0948  BP: (!) 168/103 (!) 168/98  Pulse: 89 88  Resp: 16 20  Temp:    SpO2: 100% 98%    CBC    Component Value Date/Time   WBC 4.5 12/07/2019 0335   RBC 3.06 (L) 12/07/2019 0335   HGB 8.8 (L) 12/07/2019 0335   HGB 8.3 (L) 01/06/2014 1407   HCT 28.7 (L) 12/07/2019 0335   HCT 29.1 (L) 01/06/2014 1407   PLT 209 12/07/2019 0335   PLT 272 01/06/2014 1407   MCV 93.8 12/07/2019 0335   MCV 70.8 (L) 01/06/2014 1407   MCH 28.8 12/07/2019 0335   MCHC 30.7 12/07/2019 0335   RDW 17.9 (H) 12/07/2019 0335   RDW 22.2 (H) 01/06/2014 1407   LYMPHSABS 0.9 12/07/2019 0335   LYMPHSABS 1.4 01/06/2014 1407   MONOABS 0.4 12/07/2019 0335   MONOABS 0.6 01/06/2014 1407   EOSABS 0.0 12/07/2019 0335   EOSABS 0.5 01/06/2014 1407   BASOSABS 0.0 12/07/2019 0335   BASOSABS 0.0 01/06/2014 1407    BMET    Component Value Date/Time   NA 138 12/09/2019 1043   NA 145 (H) 12/27/2017 1158   NA 141 01/06/2014 1408   K 3.9 12/09/2019 1043   K 4.1 01/06/2014 1408   CL 104 12/09/2019 1043   CO2 19 (L) 12/09/2019 1043   CO2 19 (L) 01/06/2014 1408   GLUCOSE 81 12/09/2019 1043   GLUCOSE 77 01/06/2014 1408   BUN 77 (H) 12/09/2019 1043   BUN 44 (H) 12/27/2017 1158   BUN 30.8 (H) 01/06/2014 1408   CREATININE 8.83 (H) 12/09/2019 1043   CREATININE 2.0 (H) 01/06/2014 1408   CALCIUM 8.0 (L) 12/09/2019 1043   CALCIUM 7.8 (L) 01/06/2014 1408   GFRNONAA 5 (L) 12/09/2019 1043   GFRAA 5 (L) 12/09/2019 1043    INR    Component Value Date/Time   INR 1.1 12/09/2019 1705     Intake/Output Summary (Last 24 hours) at 12/10/2019  1116 Last data filed at 12/10/2019 0900 Gross per 24 hour  Intake 205.05 ml  Output 625 ml  Net -419.95 ml     Assessment/Plan:  54 y.o. female end-stage renal disease.  Plan for hemodialysis catheter through interventional radiology.  Plan left arm AV fistula on 12/12/2019 as inpatient at [place] Larinda Herter, MD FACS [facility] [phone] 12/10/2019 11:16 AM

## 2019-12-10 NOTE — Progress Notes (Signed)
Nephrology Follow-Up Consult note   Assessment/Recommendations: [name] is a/an 54 y.o. female with a past medical history significant for CKD, anemia, seizure disorder, HTN, GERD, depression who present w/ HTN urgency and volume overload  AKI on CKD5, nonoliguric: Longstanding CKD w/ recent BL ~5 but fluctuates. Now ESRD and pt is accepting of starting HD.    TDC today w/ IR.   vascular surgery on baord for permanent access -- Vein mapping ordered. Plan for [facility] 9/23 at [facility].   First HD today post catheter placement. Will proceed with slow start protocol. Next HD tomorrow  Continue diuretics at the current time  Will need to CLIP to [facility]  Volume Status: Improving, Cont diuretics pending start of HD  Hypertension, uncontrolled: BP elevated on labetalol.  Add amlodipine. Once getting full HD treatments should expect improvement with HD as she tolerates UF  Secondary hyperparathyroidism: On calcitriol 36mcg daily.  Started sevelamer 800mg  TID on 9/19. Trend with HD initiation  Anemia due to CKD: Hemoglobin 8.8.  Iron sat 25 and ferritin 49 on 11/22/2019. Dosed feraheme x1 9/19. Will need 1gm total iron load. Plan for 9 more doses of feraheme w/ HD once on a steady schedule. and will likley need Aranesp thereafter. Transfuse for hgb <7.  Anion gap metabolic acidosis: Cont NaHCO3 until HD started.   Discussed with primary service   [facility] 12/10/2019 9:08 AM  ___________________________________________________________  CC: AKI on CKD5  Interval History/Subjective:   No acute events. Nervous about starting dialysis and having procedures done but overall still ready to start dialysis. Still having 'brain fog' and dysgeusia. No labs yet this am.    Medications:  Current Facility-Administered Medications  Medication Dose Route Frequency Provider Last Rate Last Admin  . 0.9 %  sodium chloride infusion  250 mL Intravenous PRN Barton Dubois, MD      . acetaminophen (TYLENOL) tablet 650 mg  650 mg Oral Q8H PRN Barton Dubois, MD   650 mg at 12/08/19 1309  . amLODipine (NORVASC) tablet 10 mg  10 mg Oral Daily Pearson Grippe B, MD   10 mg at 12/09/19 1104  . aspirin EC tablet 81 mg  81 mg Oral Q breakfast Barton Dubois, MD   81 mg at 12/09/19 0825  . calcitRIOL (ROCALTROL) capsule 1 mcg  1 mcg Oral Daily Barton Dubois, MD   1 mcg at 12/09/19 0900  . calcium carbonate (OS-CAL - dosed in mg of elemental calcium) tablet 1,000 mg of elemental calcium  2 tablet Oral BID WC Barton Dubois, MD   1,000 mg of elemental calcium at 12/09/19 1633  . calcium carbonate (TUMS - dosed in mg elemental calcium) chewable tablet 200 mg of elemental calcium  1 tablet Oral Q12H PRN Barton Dubois, MD   200 mg of elemental calcium at 12/07/19 1424  . Chlorhexidine Gluconate Cloth 2 % PADS 6 each  6 each Topical Daily Barton Dubois, MD   6 each at 12/09/19 [phone]  . Chlorhexidine Gluconate Cloth 2 % PADS 6 each  6 each Topical Q0600 Rexene Agent, MD   6 each at 12/10/19 [phone]  . ferric gluconate (NULECIT) 125 mg in sodium chloride 0.9 % 100 mL IVPB  125 mg Intravenous Daily Reesa Chew, MD   Paused at 12/09/19 1025  . furosemide (LASIX) 120 mg in dextrose 5 % 50 mL IVPB  120 mg Intravenous BID Reesa Chew, MD 62 mL/hr at 12/10/19 0638 120 mg at  12/10/19 3734  . heparin injection 5,000 Units  5,000 Units Subcutaneous Q8H Barton Dubois, MD   5,000 Units at 12/09/19 2150  . hydrALAZINE (APRESOLINE) injection 10 mg  10 mg Intravenous Q6H PRN Barton Dubois, MD   10 mg at 12/09/19 0829  . HYDROmorphone (DILAUDID) injection 1 mg  1 mg Intravenous Q4H PRN Barton Dubois, MD   1 mg at 12/10/19 0206  . hydrOXYzine (ATARAX/VISTARIL) tablet 25 mg  25 mg Oral BID Barton Dubois, MD   25 mg at 12/09/19 2146  . labetalol (NORMODYNE) tablet 200 mg  200 mg Oral BID Barton Dubois, MD   200 mg at 12/09/19 2149  . levETIRAcetam (KEPPRA XR) 24 hr tablet 500 mg   500 mg Oral Daily Barton Dubois, MD   500 mg at 12/09/19 0904  . pantoprazole (PROTONIX) EC tablet 40 mg  40 mg Oral Daily Barton Dubois, MD   40 mg at 12/09/19 0900  . promethazine (PHENERGAN) injection 12.5 mg  12.5 mg Intravenous Q6H PRN Barton Dubois, MD   12.5 mg at 12/09/19 2152  . sevelamer carbonate (RENVELA) tablet 800 mg  800 mg Oral TID WC Reesa Chew, MD   800 mg at 12/09/19 1633  . sodium bicarbonate tablet 1,300 mg  1,300 mg Oral TID Reesa Chew, MD   1,300 mg at 12/09/19 2145  . sodium chloride flush (NS) 0.9 % injection 3 mL  3 mL Intravenous Q12H Barton Dubois, MD   3 mL at 12/09/19 2158  . sodium chloride flush (NS) 0.9 % injection 3 mL  3 mL Intravenous PRN Barton Dubois, MD      . temazepam (RESTORIL) capsule 15 mg  15 mg Oral QHS PRN Barton Dubois, MD   15 mg at 12/08/19 2135  . tiZANidine (ZANAFLEX) tablet 4 mg  4 mg Oral Q12H PRN Barton Dubois, MD      . venlafaxine XR (EFFEXOR-XR) 24 hr capsule 150 mg  150 mg Oral Q breakfast Barton Dubois, MD   150 mg at 12/09/19 2876      Review of Systems: 10 systems reviewed and negative except per interval history/subjective  Physical Exam: Vitals:   12/10/19 0825 12/10/19 0830  BP: (!) 179/111 (!) 168/103  Pulse: 87 89  Resp: 16 16  Temp:    SpO2: 99% 100%   No intake/output data recorded.  Intake/Output Summary (Last 24 hours) at 12/10/2019 0908 Last data filed at 12/10/2019 8115 Gross per 24 hour  Intake 160.64 ml  Output --  Net 160.64 ml   Constitutional: well-appearing, no acute distress, sitting up in bed ENMT: ears and nose without scars or lesions, MMM CV: normal rate, no edema in lower extremities Respiratory: bilateral chest rise, normal work of breathing, cta bl Gastrointestinal: soft, non-tender, no palpable masses or hernias Skin: no visible lesions or rashes Msk: no significant edema Neuro: speech clear and coherent, moves all ext spontaneously   Test Results I personally  reviewed new and old clinical labs and radiology tests Lab Results  Component Value Date   NA 138 12/09/2019   K 3.9 12/09/2019   CL 104 12/09/2019   CO2 19 (L) 12/09/2019   BUN 77 (H) 12/09/2019   CREATININE 8.83 (H) 12/09/2019   GFR 30.27 (L) 02/15/2017   CALCIUM 8.0 (L) 12/09/2019   ALBUMIN 3.5 12/09/2019   PHOS 8.4 (H) 12/09/2019

## 2019-12-10 NOTE — Procedures (Signed)
Interventional Radiology Procedure Note  Procedure: Tunneled hemodialysis catheter placement  Findings: Please refer to procedural dictation for full description. 14.5 Fr, dual lumen Palindrome, 19 cm.  Right IJ.  Complications: None immediate  Estimated Blood Loss: <10 mL  Recommendations: Catheter ready for immediate use, as needed.   Ruthann Cancer, MD

## 2019-12-10 NOTE — Progress Notes (Signed)
PROGRESS NOTE    [name]  MNO:[id number] DOB: 11-26-1965 DOA: 12/07/2019 PCP: Pixie Casino, MD    Chief Complaint  Patient presents with  . Hypertension    Brief Narrative:  [name] is a 54 y.o. female with medical history significant of with a past medical history significant for anemia of chronic disease, seizure disorders, hypertension, gastroesophageal flux disease, bipolar disorder/depression and end-stage renal disease (refusing hemodialysis); who presented to the hospital secondary to headaches, nausea and shortness of breath on exertion.  Patient reports symptom has been present for the last 3-4 days and worsening.  She denies any fever, hematemesis, hematuria, melena, hematochezia, abdominal pain, blurred vision, focal weakness or any other complaints.  Reports to be compliant with her medications no recent changes to them.  No sick contacts.  ED Course: Patient found in hypertensive crisis, chest x-ray demonstrating vascular congestion and with worsening in her renal function and BUN.  Labetalol x5 doses given throughout her ED stay and subsequently due to failure improvement in her blood pressure was started on Cardene drip.  CT scan of the head demonstrated no acute intracranial abnormalities.  For her vascular congestion 100 mg of Lasix times once given.  TRH consulted to admit patient for further evaluation and management.  Assessment & Plan: 1-hypertensive crisis -With worsening renal function and headaches as part of endorgan damage. -Patient's BP still fluctuating, but much better control.  Has been off Cardene drip for 24 hours now.   -nephrology service assisting with diuretics management; will continue current IV Lasix. -continue renal/low sodium diet  -patient expressed desired for HD; after discussing with nephrology service plan is for initiation during this hospitalization. -Patient will have dietetic placement tomorrow 12/10/2019; and that is  anticipated intervention on 9/23 21 for creation of AV fistula versus graft by vascular surgery. -continue labetalol, started on amlodipine and will continue PRN hydralazine  -no CP, no nausea vomiting. -Expressed breathing is better overall.  2-nausea/vomiting -continue PRN antiemetics -continue PPI  3-end-stage renal disease -continue to follow recommendations by nephrology -Patient will have hemodialysis catheter placed later today with initial dialysis treatment to be started after catheter placement.  Anticipated next treatment 12/11/19 -Will follow response and nephrology service recommendation -Patient will require clipping process prior to discharge  4-history of seizure disorders -continue keppra.  -No seizure appreciated.  5-anemia of chronic kidney disease -iron and epogen therapy as per nephrology discretion   6-hypocalcemia -Continue calcitriol and elemental calcium supplementation. -follow electrolytes.   7-metabolic acidosis in the setting of renal failure -Continue sodium bicarbonate -Nephrology to further assist with decisions about the needs of it, after hemodialysis initiated.  8-depression -no SI or hallucinations -continue effexor   9-DNR/DNI -Long discussions with patient about future intervention and anticipated care -Patient has expressed her wishes for HD currently, nephrology to further discussed with her about and assist with decisions on initiation and access.  -expressed still not wanting artifical life support, shocking or intubation.  -Wishes will be respected. -Long-term prognosis without HD very poor.  9-secondary hyperparathyroidism -Has been sent over melena    DVT prophylaxis: heparin  Code Status: DNR  Family Communication: no family at bedside Disposition:   Status is: Inpatient  Dispo: The patient is from: home               Anticipated d/c is to: home               Anticipated d/c date is: To be determined  Patient currently not medically stable for discharge yet; still with mild SOB with activity and will require hemodialysis initiation.  After discussing with nephrology service planning for dialysis catheter placement on 12/10/19 and vascular surgery is planning for AV fistula versus graft on 12/12/19. Will follow nephrology rec's, continue adjusting antihypertensive agents and follow clinical response to hemodialysis initiation (first tx today after HD catheter in place).    Consultants:   Nephrology service  Palliative care   Procedures:  See below for x-ray reports    Antimicrobials:  None    Subjective: Reports some improvement in her breathing.  No nausea, no vomiting, no chest pain, no headaches.  Ready to have dialysis catheter placed.  Objective: Vitals:   12/10/19 1400 12/10/19 1500 12/10/19 1612 12/10/19 1700  BP: (!) 179/109 (!) 181/108  (!) 172/97  Pulse: 87 100 83 89  Resp: 15 19  14   Temp:   98.2 F (36.8 C)   TempSrc:   Oral   SpO2: 99% 97% 98% 95%  Weight:      Height:        Intake/Output Summary (Last 24 hours) at 12/10/2019 1811 Last data filed at 12/10/2019 1359 Gross per 24 hour  Intake 194.41 ml  Output 825 ml  Net -630.59 ml   Filed Weights   12/08/19 0500 12/09/19 0500 12/10/19 0500  Weight: 64.9 kg 65.8 kg 65.8 kg    Examination: General exam: Alert, awake, oriented x 3, no chest pain, no nausea, no vomiting.  Report feeling breathing is better.  Patient is afebrile Respiratory system: Decreased breath sounds at the bases, no wheezing, no using accessory muscle. Cardiovascular system:RRR. No murmurs, rubs, gallops.  No JVD. Gastrointestinal system: Abdomen is nondistended, soft and nontender. No organomegaly or masses felt. Normal bowel sounds heard. Central nervous system: Alert and oriented. No focal neurological deficits. Extremities: No cyanosis or clubbing; positive trace edema. Skin: No rashes, no petechiae. Psychiatry: Judgement  and insight appear normal. Mood & affect appropriate.    Data Reviewed: I have personally reviewed following labs and imaging studies  CBC: Recent Labs  Lab 12/07/19 0335  WBC 4.5  NEUTROABS 3.2  HGB 8.8*  HCT 28.7*  MCV 93.8  PLT 174    Basic Metabolic Panel: Recent Labs  Lab 12/07/19 0335 12/07/19 0830 12/08/19 0548 12/09/19 1043  NA 137  --  140 138  K 4.4  --  3.9 3.9  CL 109  --  107 104  CO2 17*  --  17* 19*  GLUCOSE 103*  --  106* 81  BUN 76*  --  76* 77*  CREATININE 7.60*  --  8.04* 8.83*  CALCIUM 8.3*  --  8.6* 8.0*  MG  --  1.9  --   --   PHOS  --  7.2* 8.6* 8.4*    GFR: Estimated Creatinine Clearance: 6.1 mL/min (A) (by C-G formula based on SCr of 8.83 mg/dL (H)).  Liver Function Tests: Recent Labs  Lab 12/07/19 0335 12/08/19 0548 12/09/19 1043  AST 13*  --   --   ALT 13  --   --   ALKPHOS 76  --   --   BILITOT 0.6  --   --   PROT 7.1  --   --   ALBUMIN 3.8 3.6 3.5    CBG: No results for input(s): GLUCAP in the last 168 hours.   Recent Results (from the past 240 hour(s))  SARS Coronavirus 2 by RT PCR (hospital order,  performed in [facility] hospital lab) Nasopharyngeal Nasopharyngeal Swab     Status: None   Collection Time: 12/07/19  6:37 AM   Specimen: Nasopharyngeal Swab  Result Value Ref Range Status   SARS Coronavirus 2 NEGATIVE NEGATIVE Final    Comment: (NOTE) SARS-CoV-2 target nucleic acids are NOT DETECTED.  The SARS-CoV-2 RNA is generally detectable in upper and lower respiratory specimens during the acute phase of infection. The lowest concentration of SARS-CoV-2 viral copies this assay can detect is 250 copies / mL. A negative result does not preclude SARS-CoV-2 infection and should not be used as the sole basis for treatment or other patient management decisions.  A negative result may occur with improper specimen collection / handling, submission of specimen other than nasopharyngeal swab, presence of viral mutation(s)  within the areas targeted by this assay, and inadequate number of viral copies (<250 copies / mL). A negative result must be combined with clinical observations, patient history, and epidemiological information.  Fact Sheet for Patients:   StrictlyIdeas.no  Fact Sheet for Healthcare Providers: BankingDealers.co.za  This test is not yet approved or  cleared by the [place] FDA and has been authorized for detection and/or diagnosis of SARS-CoV-2 by FDA under an Emergency Use Authorization (EUA).  This EUA will remain in effect (meaning this test can be used) for the duration of the COVID-19 declaration under Section 564(b)(1) of the Act, 21 U.S.C. section 360bbb-3(b)(1), unless the authorization is terminated or revoked sooner.  Performed at [facility], [address], [place], [place] [address]   MRSA PCR Screening     Status: None   Collection Time: 12/07/19 10:07 AM   Specimen: Nasal Mucosa; Nasopharyngeal  Result Value Ref Range Status   MRSA by PCR NEGATIVE NEGATIVE Final    Comment:        The GeneXpert MRSA Assay (FDA approved for NASAL specimens only), is one component of a comprehensive MRSA colonization surveillance program. It is not intended to diagnose MRSA infection nor to guide or monitor treatment for MRSA infections. Performed at [facility], [address], [place], [place] [address]       Radiology Studies: [place] UE VEIN MAPPING LEFT (PRE-OP AVF)  Result Date: 12/09/2019 CLINICAL DATA:  54 year old female, bilateral upper extremity vein mapping EXAM: [place] EXTREM UP VEIN MAPPING COMPARISON:  None. FINDINGS: RIGHT ARTERIES Wrist Radial Artery: Size 3.644m Waveform Triphasic Wrist Ulnar Artery: Size 2.642mWaveform Normal Prox. Forearm Radial Artery: Size 2.44m82maveform Normal Upper Arm Brachial Artery: High bifurcation of the brachial artery is noted. Size 4.1mm528madial), 3.8 mm (ulnar) waveform Triphasic RIGHT  VEINS Forearm Cephalic Vein: Prox 2.28m5.8IFtal 4.4mm 22mth 3.2mm0.2DXr Arm Cephalic Vein: Prox 4.28mm4.1OIal 4.28mm D46mh 2.8mm 7.8MV Arm Basilic Vein: Prox 4.8mm 6.7MCl 5.28mm De53m 7.8mm Upp36mArm Brachial Vein: Prox 2.2mm Dist628m2.2mm Depth61m.6mm ADDITI22mL RIGHT VEINS Axillary Vein:  7.8mm Subclav62m Vein: Patient: Yes Respiratory Phasicity: Present Internal Jugular Vein: Patent: Yes    Respiratory Phasicity: Present Branches > 2 mm: None LEFT ARTERIES Wrist Radial Artery: Size 3.0mm Waveform28miphasic Wrist Ulnar Artery: Size 1.2mm Waveform 21mmal Prox. Forearm Radial Artery: Size 3.1mm Waveform T444mhasic Upper Arm Brachial Artery: Size 4.28mm Waveform Tr144masic LEFT VEINS Forearm Cephalic Vein: Prox 2.2mm Distal 4.19.4BSpth 3.44mm 3mer Arm C9.6GEic Vein: Prox 3.9mm Distal 4.28m3.6OQth 2.2mm U39mr Arm Ba9.4TM Vein: Prox 4.9mm Distal 5.4mm5.4YTh 7.0mm Up90m Arm Brach51m Vein: Prox 3.9mm Distal 5.0mm Dep66m11.3mm ADDI46mNAL LEFT VE42m Axillary Vein:  12.4mm Subclavian Vein:53m  Patient: Yes Respiratory Phasicity: Present Internal Jugular Vein: Patent: Yes    Respiratory Phasicity: Present Branches > 2 mm: None IMPRESSION: 1. High bifurcation of the right brachial artery is noted. 2. Patent bilateral upper extremity arteries and veins as described. Electronically Signed   By: Ruthann Cancer MD   On: 12/09/2019 16:22   IR US Guide Vasc Access Right  Result Date: 12/10/2019 INDICATION: 54 year old female with history of end-stage renal disease. Presents for tunneled hemodialysis catheter. EXAM: TUNNELED CENTRAL VENOUS HEMODIALYSIS CATHETER PLACEMENT WITH ULTRASOUND AND FLUOROSCOPIC GUIDANCE MEDICATIONS: Ancef 2 gm IV . The antibiotic was given in an appropriate time interval prior to skin puncture. ANESTHESIA/SEDATION: Moderate (conscious) sedation was employed during this procedure. A total of Versed 1.5 mg and Fentanyl 50 mcg was administered intravenously. Moderate Sedation Time: 15 minutes. The patient's level of  consciousness and vital signs were monitored continuously by radiology nursing throughout the procedure under my direct supervision. FLUOROSCOPY TIME:  0 minutes 54 seconds (2 mGy). COMPLICATIONS: None immediate. PROCEDURE: Informed written consent was obtained from the patient after a discussion of the risks, benefits, and alternatives to treatment. Questions regarding the procedure were encouraged and answered. The right neck and chest were prepped with chlorhexidine in a sterile fashion, and a sterile drape was applied covering the operative field. Maximum barrier sterile technique with sterile gowns and gloves were used for the procedure. A timeout was performed prior to the initiation of the procedure. After creating a small venotomy incision, a 21 gauge micropuncture kit was utilized to access the internal jugular vein. Real-time ultrasound guidance was utilized for vascular access including the acquisition of a permanent ultrasound image documenting patency of the accessed vessel. A J wire was advanced to the level of the IVC and the micropuncture sheath was exchanged for an 8 Fr dilator. A 14.5 French tunneled hemodialysis catheter measuring 19 cm from tip to cuff was tunneled in a retrograde fashion from the anterior chest wall to the venotomy incision. Serial dilation was then performed an a peel-away sheath was placed. The catheter was then placed through the peel-away sheath with the catheter tip ultimately positioned within the right atrium. Final catheter positioning was confirmed and documented with a spot radiographic image. The catheter aspirates and flushes normally. The catheter was flushed with appropriate volume heparin dwells. The catheter exit site was secured with a 0-Silk retention suture. The venotomy incision was closed with Dermabond. Sterile dressings were applied. The patient tolerated the procedure well without immediate post procedural complication. IMPRESSION: Successful placement of  19 cm tip to cuff tunneled hemodialysis catheter via the right internal jugular vein with catheter tip terminating within the right atrium. The catheter is ready for immediate use. Electronically Signed   By: Ruthann Cancer MD   On: 12/10/2019 13:21   [place] UE VEIN MAPPING RIGHT (PRE-OP AVF)  Result Date: 12/10/2019 CLINICAL DATA:  54 year old female, bilateral upper extremity vein mapping EXAM: [place] EXTREM UP VEIN MAPPING COMPARISON:  None. FINDINGS: RIGHT ARTERIES Wrist Radial Artery: Size 3.66m Waveform Triphasic Wrist Ulnar Artery: Size 2.633mWaveform Normal Prox. Forearm Radial Artery: Size 2.78m61maveform Normal Upper Arm Brachial Artery: High bifurcation of the brachial artery is noted. Size 4.1mm62madial), 3.8 mm (ulnar) waveform Triphasic RIGHT VEINS Forearm Cephalic Vein: Prox 2.42m8.4XLtal 4.4mm 14mth 3.2mm2.4MWr Arm Cephalic Vein: Prox 4.42mm1.0UVal 4.42mm D742mh 2.8mm 2.5DG Arm Basilic Vein: Prox 4.8mm 6.4QIl 5.42mm De61m 7.8mm Upp9mArm Brachial Vein: Prox 2.2mm Dist58m2.2mm Depth60m.6mm ADDITI84mL RIGHT VEINS  Axillary Vein:  7.27m Subclavian Vein: Patient: Yes Respiratory Phasicity: Present Internal Jugular Vein: Patent: Yes    Respiratory Phasicity: Present Branches > 2 mm: None LEFT ARTERIES Wrist Radial Artery: Size 3.049mWaveform Triphasic Wrist Ulnar Artery: Size 1.72m372maveform Normal Prox. Forearm Radial Artery: Size 3.1mm83mveform Triphasic Upper Arm Brachial Artery: Size 4.5mm 67meform Triphasic LEFT VEINS Forearm Cephalic Vein: Prox 2.72mm6.5YYal 4.1mm D33mh 3.7mm 5.0PT Arm Cephalic Vein: Prox 3.9mm 4.6FKl 4.5mm De33m 2.72mm U8.1EXArm Basilic Vein: Prox 4.9mm D5.1ZG 5.4mm Dep272m7.0mm Uppe48mrm Brachial Vein: Prox 3.9mm Dista21m.0mm Depth 38m3mm ADDITIO472m LEFT VEINS Axillary Vein:  12.4mm Subclavi20mVein: Patient: Yes Respiratory Phasicity: Present Internal Jugular Vein: Patent: Yes    Respiratory Phasicity: Present Branches > 2 mm: None IMPRESSION: 1. High bifurcation of the right brachial artery is  noted. 2. Patent bilateral upper extremity arteries and veins as described. Electronically Signed   By: Dylan  SuttleRuthann Cancer20/2021 16:22   IR TUNNELED CENTRAL VENOUS CATHETER PLACEMENT  Result Date: 12/10/2019 INDICATION: 54 year old f44ale with history of end-stage renal disease. Presents for tunneled hemodialysis catheter. EXAM: TUNNELED CENTRAL VENOUS HEMODIALYSIS CATHETER PLACEMENT WITH ULTRASOUND AND FLUOROSCOPIC GUIDANCE MEDICATIONS: Ancef 2 gm IV . The antibiotic was given in an appropriate time interval prior to skin puncture. ANESTHESIA/SEDATION: Moderate (conscious) sedation was employed during this procedure. A total of Versed 1.5 mg and Fentanyl 50 mcg was administered intravenously. Moderate Sedation Time: 15 minutes. The patient's level of consciousness and vital signs were monitored continuously by radiology nursing throughout the procedure under my direct supervision. FLUOROSCOPY TIME:  0 minutes 54 seconds (2 mGy). COMPLICATIONS: None immediate. PROCEDURE: Informed written consent was obtained from the patient after a discussion of the risks, benefits, and alternatives to treatment. Questions regarding the procedure were encouraged and answered. The right neck and chest were prepped with chlorhexidine in a sterile fashion, and a sterile drape was applied covering the operative field. Maximum barrier sterile technique with sterile gowns and gloves were used for the procedure. A timeout was performed prior to the initiation of the procedure. After creating a small venotomy incision, a 21 gauge micropuncture kit was utilized to access the internal jugular vein. Real-time ultrasound guidance was utilized for vascular access including the acquisition of a permanent ultrasound image documenting patency of the accessed vessel. A J wire was advanced to the level of the IVC and the micropuncture sheath was exchanged for an 8 Fr dilator. A 14.5 French tunneled hemodialysis catheter measuring 19 cm  from tip to cuff was tunneled in a retrograde fashion from the anterior chest wall to the venotomy incision. Serial dilation was then performed an a peel-away sheath was placed. The catheter was then placed through the peel-away sheath with the catheter tip ultimately positioned within the right atrium. Final catheter positioning was confirmed and documented with a spot radiographic image. The catheter aspirates and flushes normally. The catheter was flushed with appropriate volume heparin dwells. The catheter exit site was secured with a 0-Silk retention suture. The venotomy incision was closed with Dermabond. Sterile dressings were applied. The patient tolerated the procedure well without immediate post procedural complication. IMPRESSION: Successful placement of 19 cm tip to cuff tunneled hemodialysis catheter via the right internal jugular vein with catheter tip terminating within the right atrium. The catheter is ready for immediate use. Electronically Signed   By: Dylan  SuttleRuthann Cancer21/2021 13:21    Scheduled Meds: . amLODipine  10 mg Oral Daily  .  aspirin EC  81 mg Oral Q breakfast  . calcitRIOL  1 mcg Oral Daily  . calcium carbonate  2 tablet Oral BID WC  . Chlorhexidine Gluconate Cloth  6 each Topical Daily  . Chlorhexidine Gluconate Cloth  6 each Topical Q0600  . heparin  5,000 Units Subcutaneous Q8H  . hydrOXYzine  25 mg Oral BID  . labetalol  200 mg Oral BID  . levETIRAcetam  500 mg Oral Daily  . pantoprazole  40 mg Oral Daily  . sevelamer carbonate  800 mg Oral TID WC  . sodium bicarbonate  1,300 mg Oral TID  . sodium chloride flush  3 mL Intravenous Q12H  . venlafaxine XR  150 mg Oral Q breakfast   Continuous Infusions: . sodium chloride    . ferric gluconate (FERRLECIT/NULECIT) IV Stopped (12/09/19 1025)  . furosemide 120 mg (12/10/19 1359)     LOS: 3 days    Time spent: 30 minutes.    Barton Dubois, MD [facility]   To contact the attending provider  between 7A-7P or the covering provider during after hours 7P-7A, please log into the web site [email or web address] and access using universal [place] password for that web site. If you do not have the password, please call the hospital operator.  12/10/2019, 6:11 PM

## 2019-12-10 NOTE — H&P (Signed)
Chief Complaint: Patient was seen in consultation today for  Chief Complaint  Patient presents with  . Hypertension    Referring Physician(s): Dr. Joelyn Oms, Nephrology  Supervising Physician: Dr. Serafina Royals  Patient Status: [name], in-patient. Patient transferred to [facility] for this procedure and she will return to [facility] afterwards.   History of Present Illness: [name] is a 54 y.o. female with a medical history significant for bipolar disorder, anxiety/depression, HTN, seizures and chronic kidney disease stage V. She presented to the [facility] ED 12/07/19 with severe headache and nausea and her BP was 200/140's. Labetalol pushes were unsuccessful in lowering her BP and she was placed on a nicardipine drip. BUN/Cr on admission were 76/7.60. She was also volume overloaded with a BNP of >2500. She is followed by Dr. Hollie Salk with [facility] and her team is preparing her to start hemodialysis.   Interventional Radiology has been asked to evaluate this patient for an image-guided tunneled dialysis catheter to facilitate her treatment plans.   Past Medical History:  Diagnosis Date  . Anemia   . Anxiety   . Bipolar disorder ([place])   . Depression   . GERD (gastroesophageal reflux disease)   . History of blood transfusion   . Hypertension   . IUD    HISTORY OF IUD --REMOVED IN 2006  . Seizures ([place]) [id number]   two seizures due to a med changes    Past Surgical History:  Procedure Laterality Date  . CARPAL TUNNEL RELEASE Right 07/08/2014   Procedure: RIGHT CARPAL TUNNEL RELEASE;  Surgeon: Sanjuana Kava, MD;  Location: [facility];  Service: Orthopedics;  Laterality: Right;  . CHOLECYSTECTOMY    . GASTRIC BYPASS  2000  . HYSTEROSCOPY WITH D & C  11/14/2011   Procedure: DILATATION AND CURETTAGE /HYSTEROSCOPY;  Surgeon: Marylynn Pearson, MD;  Location: [facility];  Service: Gynecology;;  with removal of polyps  . INTRAUTERINE DEVICE (IUD) INSERTION  03/21/2010  . LAPAROSCOPIC  GASTROTOMY W/ REPAIR OF ULCER    . PANNICULECTOMY    . ROTATOR CUFF REPAIR      Allergies: Ambien [zolpidem tartrate], Geodon [ziprasidone hcl], Lactulose, Ondansetron, Quetiapine, Sulfa antibiotics, and Zolpidem  Medications: Prior to Admission medications   Medication Sig Start Date End Date Taking? Authorizing Provider  acetaminophen (TYLENOL 8 HOUR) 650 MG CR tablet Take 650 mg by mouth every 8 (eight) hours as needed for pain.   Yes [provider]  aspirin EC 81 MG tablet Take 1 tablet (81 mg total) by mouth daily with breakfast. 07/25/19  Yes Emokpae, Courage, MD  calcitRIOL (ROCALTROL) 0.5 MCG capsule Take 1 mcg by mouth daily. 11/21/19  Yes [provider]  calcium carbonate (OS-CAL - DOSED IN MG OF ELEMENTAL CALCIUM) 1250 (500 Ca) MG tablet Take 2 tablets (1,000 mg of elemental calcium total) by mouth 2 (two) times daily with a meal. 07/25/19  Yes Emokpae, Courage, MD  carvedilol (COREG) 25 MG tablet Take 1 tablet (25 mg total) by mouth 2 (two) times daily with a meal. 07/25/19  Yes Emokpae, Courage, MD  epoetin alfa-epbx (RETACRIT) 40981 UNIT/ML injection 20,000 Units every 30 (thirty) days.   Yes Madelon Lips, MD  furosemide (LASIX) 80 MG tablet Take 1 tablet (80 mg total) by mouth daily. 07/25/19  Yes Emokpae, Courage, MD  hydrALAZINE (APRESOLINE) 25 MG tablet Take 25 mg by mouth 2 (two) times daily. 11/21/19  Yes [provider]  hydrOXYzine (ATARAX/VISTARIL) 25 MG tablet Take 1 tablet (25 mg  total) by mouth 2 (two) times daily. 07/25/19  Yes Emokpae, Courage, MD  levETIRAcetam (KEPPRA XR) 500 MG 24 hr tablet Take 1 tablet (500 mg total) by mouth daily. 08/02/19  Yes Cameron Sprang, MD  promethazine (PHENERGAN) 25 MG tablet Take 25 mg by mouth every 6 (six) hours as needed. 01/01/19  Yes [provider]  sodium bicarbonate 650 MG tablet Take 1 tablet (650 mg total) by mouth 2 (two) times daily. 07/25/19  Yes Emokpae, Courage, MD  tiZANidine (ZANAFLEX) 4 MG  tablet Take 1 tablet (4 mg total) by mouth 2 (two) times daily as needed. TAKE 1 TABLET BY MOUTH EVERY 12 HOURS AS NEEDED FOR SPASM 11/12/19 12/12/19 Yes Carole Civil, MD  traMADol (ULTRAM) 50 MG tablet TAKE 1 TABLET BY MOUTH EVERY SIX HOURS AS NEEDED. Patient taking differently: Take 50 mg by mouth every 6 (six) hours as needed for moderate pain.  10/29/19  Yes Sanjuana Kava, MD  venlafaxine XR (EFFEXOR-XR) 150 MG 24 hr capsule Take 150 mg by mouth daily with breakfast.   Yes [provider]     Family History  Problem Relation Age of Onset  . Hypertension Mother   . Kidney failure Mother   . Hypertension Father   . Diabetes Cousin   . Diabetes Sister   . Hypertension Sister   . Kidney disease Sister        dialysis  . Hypertension Brother   . Heart attack Brother   . Hypertension Maternal Aunt   . Hypertension Maternal Uncle   . Hypertension Maternal Grandmother   . Hypertension Maternal Grandfather   . Hypertension Paternal Grandmother   . Hypertension Paternal Grandfather   . Heart attack Brother   . Hypertension Brother   . Hypertension Sister     Social History   Socioeconomic History  . Marital status: Single    Spouse name: Not on file  . Number of children: 0  . Years of education: 29  . Highest education level: Not on file  Occupational History  . Occupation: Retired  Tobacco Use  . Smoking status: Former Smoker    Packs/day: 0.25    Years: 4.00    Pack years: 1.00    Types: Cigarettes    Quit date: 03/21/1988    Years since quitting: 31.7  . Smokeless tobacco: Never Used  Vaping Use  . Vaping Use: Never used  Substance and Sexual Activity  . Alcohol use: Yes    Comment: 1-2 times a year.   . Drug use: No  . Sexual activity: Not Currently    Birth control/protection: I.U.D.  Other Topics Concern  . Not on file  Social History Narrative   Fun: Puzzles, watch mystery shows, read   Denies religious beliefs effecting health care.        Pt lives in 2 story home with her sister-in-law and niece   Some college education   Retired Scientist, research (medical)    Social Determinants of Radio broadcast assistant Strain:   . Difficulty of Paying Living Expenses: Not on file  Food Insecurity:   . Worried About Charity fundraiser in the Last Year: Not on file  . Ran Out of Food in the Last Year: Not on file  Transportation Needs:   . Lack of Transportation (Medical): Not on file  . Lack of Transportation (Non-Medical): Not on file  Physical Activity:   . Days of Exercise per Week: Not on file  .  Minutes of Exercise per Session: Not on file  Stress:   . Feeling of Stress : Not on file  Social Connections:   . Frequency of Communication with Friends and Family: Not on file  . Frequency of Social Gatherings with Friends and Family: Not on file  . Attends Religious Services: Not on file  . Active Member of Clubs or Organizations: Not on file  . Attends Archivist Meetings: Not on file  . Marital Status: Not on file    Review of Systems: A 12 point ROS discussed and pertinent positives are indicated in the HPI above.  All other systems are negative.  Review of Systems  Constitutional: Positive for fatigue. Negative for appetite change.  Respiratory: Negative for cough and shortness of breath.   Cardiovascular: Negative for chest pain and leg swelling.  Gastrointestinal: Negative for abdominal pain, diarrhea, nausea and vomiting.  Neurological: Positive for headaches. Negative for light-headedness.    Vital Signs: BP (!) 168/98   Pulse 88   Temp 98.4 F (36.9 C) (Oral)   Resp 20   Ht 4\' 11"  (1.499 m)   Wt 145 lb 1 oz (65.8 kg)   SpO2 98%   BMI 29.30 kg/m   Physical Exam Constitutional:      General: She is not in acute distress. HENT:     Mouth/Throat:     Mouth: Mucous membranes are moist.     Pharynx: Oropharynx is clear.  Cardiovascular:     Rate and Rhythm: Normal rate and regular rhythm.     Pulses:  Normal pulses.     Heart sounds: Normal heart sounds.  Pulmonary:     Effort: Pulmonary effort is normal.     Breath sounds: Normal breath sounds.  Abdominal:     General: Abdomen is flat.     Palpations: Abdomen is soft.  Musculoskeletal:        General: Normal range of motion.  Skin:    General: Skin is warm and dry.  Neurological:     Mental Status: She is alert and oriented to person, place, and time.     Imaging: DG Chest 2 View  Result Date: 12/07/2019 CLINICAL DATA:  End stage renal disease with shortness of breath. EXAM: CHEST - 2 VIEW COMPARISON:  07/20/2011 FINDINGS: Mild cardiac enlargement. No pleural effusion. Pulmonary vascular congestion and mild interstitial edema noted. No airspace opacities. Bilateral glenohumeral joint osteoarthritis. IMPRESSION: 1. Pulmonary vascular congestion with mild interstitial edema noted. Electronically Signed   By: Kerby Moors M.D.   On: 12/07/2019 04:17   CT Head Wo Contrast  Result Date: 12/07/2019 CLINICAL DATA:  Worsening headache EXAM: CT HEAD WITHOUT CONTRAST TECHNIQUE: Contiguous axial images were obtained from the base of the skull through the vertex without intravenous contrast. COMPARISON:  07/19/2019 FINDINGS: Brain: There is no mass, hemorrhage or extra-axial collection. The size and configuration of the ventricles and extra-axial CSF spaces are normal. There is hypoattenuation of the white matter, most commonly indicating chronic small vessel disease. Vascular: No abnormal hyperdensity of the major intracranial arteries or dural venous sinuses. No intracranial atherosclerosis. Skull: The visualized skull base, calvarium and extracranial soft tissues are normal. Sinuses/Orbits: No fluid levels or advanced mucosal thickening of the visualized paranasal sinuses. No mastoid or middle ear effusion. The orbits are normal. IMPRESSION: Chronic small vessel disease, advanced for age, without acute intracranial abnormality. Electronically  Signed   By: Ulyses Jarred M.D.   On: 12/07/2019 04:27   US RENAL  Result Date: 12/08/2019 CLINICAL DATA:  Acute renal insufficiency EXAM: RENAL / URINARY TRACT ULTRASOUND COMPLETE COMPARISON:  None. FINDINGS: Right Kidney: Renal measurements: 8.5 x 3.9 x 4.5 cm = volume: 79 mL. Contains a 1.6 cm cyst. Increased echogenicity. No hydronephrosis. Left Kidney: Renal measurements: 8.6 x 4.7 x 4.5 cm = volume: 95 mL. Contains a 1.4 cm cyst. Increased echogenicity. Bladder: Appears normal for degree of bladder distention. Other: None. IMPRESSION: 1. Bilateral renal cysts. 2. Increased echogenicity in both kidneys consistent with medical renal disease. Electronically Signed   By: Dorise Bullion III M.D   On: 12/08/2019 17:10   [place] UE VEIN MAPPING LEFT (PRE-OP AVF)  Result Date: 12/09/2019 CLINICAL DATA:  54 year old female, bilateral upper extremity vein mapping EXAM: [place] EXTREM UP VEIN MAPPING COMPARISON:  None. FINDINGS: RIGHT ARTERIES Wrist Radial Artery: Size 3.87mm Waveform Triphasic Wrist Ulnar Artery: Size 2.76mm Waveform Normal Prox. Forearm Radial Artery: Size 2.37mm Waveform Normal Upper Arm Brachial Artery: High bifurcation of the brachial artery is noted. Size 4.15mm (radial), 3.8 mm (ulnar) waveform Triphasic RIGHT VEINS Forearm Cephalic Vein: Prox 1.1HE Distal 4.68mm Depth 1.7EY Upper Arm Cephalic Vein: Prox 8.1KG Distal 4.40mm Depth 8.1EH Upper Arm Basilic Vein: Prox 6.3JS Distal 5.60mm Depth 7.88mm Upper Arm Brachial Vein: Prox 2.73mm Distal 2.85mm Depth 15.70mm ADDITIONAL RIGHT VEINS Axillary Vein:  7.10mm Subclavian Vein: Patient: Yes Respiratory Phasicity: Present Internal Jugular Vein: Patent: Yes    Respiratory Phasicity: Present Branches > 2 mm: None LEFT ARTERIES Wrist Radial Artery: Size 3.37mm Waveform Triphasic Wrist Ulnar Artery: Size 1.12mm Waveform Normal Prox. Forearm Radial Artery: Size 3.15mm Waveform Triphasic Upper Arm Brachial Artery: Size 4.63mm Waveform Triphasic LEFT VEINS Forearm Cephalic  Vein: Prox 9.7WY Distal 4.20mm Depth 6.3ZC Upper Arm Cephalic Vein: Prox 5.8IF Distal 4.41mm Depth 0.2DX Upper Arm Basilic Vein: Prox 4.1OI Distal 5.21mm Depth 7.24mm Upper Arm Brachial Vein: Prox 3.83mm Distal 5.59mm Depth 11.43mm ADDITIONAL LEFT VEINS Axillary Vein:  12.47mm Subclavian Vein: Patient: Yes Respiratory Phasicity: Present Internal Jugular Vein: Patent: Yes    Respiratory Phasicity: Present Branches > 2 mm: None IMPRESSION: 1. High bifurcation of the right brachial artery is noted. 2. Patent bilateral upper extremity arteries and veins as described. Electronically Signed   By: Ruthann Cancer MD   On: 12/09/2019 16:22   [place] UE VEIN MAPPING RIGHT (PRE-OP AVF)  Result Date: 12/10/2019 CLINICAL DATA:  54 year old female, bilateral upper extremity vein mapping EXAM: [place] EXTREM UP VEIN MAPPING COMPARISON:  None. FINDINGS: RIGHT ARTERIES Wrist Radial Artery: Size 3.29mm Waveform Triphasic Wrist Ulnar Artery: Size 2.29mm Waveform Normal Prox. Forearm Radial Artery: Size 2.70mm Waveform Normal Upper Arm Brachial Artery: High bifurcation of the brachial artery is noted. Size 4.107mm (radial), 3.8 mm (ulnar) waveform Triphasic RIGHT VEINS Forearm Cephalic Vein: Prox 7.8MV Distal 4.36mm Depth 6.7MC Upper Arm Cephalic Vein: Prox 9.4BS Distal 4.62mm Depth 9.6GE Upper Arm Basilic Vein: Prox 3.6OQ Distal 5.70mm Depth 7.85mm Upper Arm Brachial Vein: Prox 2.59mm Distal 2.83mm Depth 15.55mm ADDITIONAL RIGHT VEINS Axillary Vein:  7.1mm Subclavian Vein: Patient: Yes Respiratory Phasicity: Present Internal Jugular Vein: Patent: Yes    Respiratory Phasicity: Present Branches > 2 mm: None LEFT ARTERIES Wrist Radial Artery: Size 3.69mm Waveform Triphasic Wrist Ulnar Artery: Size 1.23mm Waveform Normal Prox. Forearm Radial Artery: Size 3.19mm Waveform Triphasic Upper Arm Brachial Artery: Size 4.53mm Waveform Triphasic LEFT VEINS Forearm Cephalic Vein: Prox 9.4TM Distal 4.48mm Depth 5.4YT Upper Arm Cephalic Vein: Prox 0.3TW Distal 4.60mm Depth 6.5KC  Upper Arm Basilic Vein: Prox 1.2XN  Distal 5.48mm Depth 7.41mm Upper Arm Brachial Vein: Prox 3.52mm Distal 5.60mm Depth 11.57mm ADDITIONAL LEFT VEINS Axillary Vein:  12.71mm Subclavian Vein: Patient: Yes Respiratory Phasicity: Present Internal Jugular Vein: Patent: Yes    Respiratory Phasicity: Present Branches > 2 mm: None IMPRESSION: 1. High bifurcation of the right brachial artery is noted. 2. Patent bilateral upper extremity arteries and veins as described. Electronically Signed   By: Ruthann Cancer MD   On: 12/09/2019 16:22    Labs:  CBC: Recent Labs    07/21/19 6659 07/21/19 9357 07/22/19 0515 07/22/19 0515 07/24/19 0552 08/23/19 1422 09/20/19 1420 10/18/19 1435 11/22/19 1413 12/07/19 0335  WBC 4.6  --  4.3  --  3.8*  --   --   --   --  4.5  HGB 7.8*   < > 7.6*   < > 7.7*   < > 10.2* 9.3* 7.5* 8.8*  HCT 25.1*  --  25.4*  --  24.9*  --   --   --   --  28.7*  PLT 163  --  145*  --  157  --   --   --   --  209   < > = values in this interval not displayed.    COAGS: Recent Labs    12/09/19 1705  INR 1.1    BMP: Recent Labs    07/25/19 0418 12/07/19 0335 12/08/19 0548 12/09/19 1043  Vincent 142 137 140 138  K 3.9 4.4 3.9 3.9  CL 108 109 107 104  CO2 21* 17* 17* 19*  GLUCOSE 95 103* 106* 81  BUN 60* 76* 76* 77*  CALCIUM 6.8* 8.3* 8.6* 8.0*  CREATININE 4.95* 7.60* 8.04* 8.83*  GFRNONAA 9* 6* 5* 5*  GFRAA 11* 6* 6* 5*    LIVER FUNCTION TESTS: Recent Labs    07/19/19 2015 07/21/19 0613 07/25/19 0418 12/07/19 0335 12/08/19 0548 12/09/19 1043  BILITOT 0.8  --   --  0.6  --   --   AST 29  --   --  13*  --   --   ALT 15  --   --  13  --   --   ALKPHOS 122  --   --  76  --   --   PROT 6.6  --   --  7.1  --   --   ALBUMIN 3.3*   < > 2.9* 3.8 3.6 3.5   < > = values in this interval not displayed.    TUMOR MARKERS: No results for input(s): AFPTM, CEA, CA199, CHROMGRNA in the last 8760 hours.  Assessment and Plan:  End stage renal disease; pending hemodialysis:  Rayen Palen. Gholson, 54 year old female, presents today to the [facility] department for an image-guided tunneled dialysis catheter. She is an in-patient at [facility] and will return to [facility] once the procedure and her recovery are complete.   Risks and benefits discussed with the patient including, but not limited to bleeding, infection, vascular injury, pneumothorax which may require chest tube placement, air embolism or even death  All of the patient's questions were answered, patient is agreeable to proceed.  She has been NPO. Labs and vitals have been reviewed.   Consent signed and in chart.  Thank you for this interesting consult.  I greatly enjoyed meeting BRITYN MASTROGIOVANNI and look forward to participating in their care.  A copy of this report was sent to the requesting provider on this date.  Electronically Signed: Soyla Dryer, AGACNP-BC [phone] 12/10/2019, 9:58 AM   I spent a total of 20 Minutes    in face to face in clinical consultation, greater than 50% of which was counseling/coordinating care for image-guided tunneled dialysis catheter.

## 2019-12-10 NOTE — Procedures (Signed)
     INITIAL HEMODIALYSIS TREATMENT NOTE:   Indications, risks, and benefits of renal replacement therapy were reviewed at length with patient prior to obtaining consent for first ever dialysis treatment on 12/10/19.  RIJ TDC tolerated prescribed flow with stable pressures, although lines had to be reversed.    2 hour session completed without problems. Goal met: 1 liter removed without interruption in UF.  All blood was returned.  No changes from pre-dialysis assessment.  HBsAg, HBsAb, and HB core/total Ab labs were collected today.   Rockwell Alexandria, RN

## 2019-12-10 NOTE — Sedation Documentation (Signed)
Pt arrived from [facility] via [facility]. Pt is awake, alert and oriented x4. Pt vitals are stable. Will continue to monitor.

## 2019-12-11 LAB — CBC
HCT: 27.8 % — ABNORMAL LOW (ref 36.0–46.0)
Hemoglobin: 8.3 g/dL — ABNORMAL LOW (ref 12.0–15.0)
MCH: 27.8 pg (ref 26.0–34.0)
MCHC: 29.9 g/dL — ABNORMAL LOW (ref 30.0–36.0)
MCV: 93 fL (ref 80.0–100.0)
Platelets: 172 10*3/uL (ref 150–400)
RBC: 2.99 MIL/uL — ABNORMAL LOW (ref 3.87–5.11)
RDW: 17.2 % — ABNORMAL HIGH (ref 11.5–15.5)
WBC: 4.7 10*3/uL (ref 4.0–10.5)
nRBC: 0 % (ref 0.0–0.2)

## 2019-12-11 LAB — RENAL FUNCTION PANEL
Albumin: 3.8 g/dL (ref 3.5–5.0)
Anion gap: 16 — ABNORMAL HIGH (ref 5–15)
BUN: 54 mg/dL — ABNORMAL HIGH (ref 6–20)
CO2: 24 mmol/L (ref 22–32)
Calcium: 8.4 mg/dL — ABNORMAL LOW (ref 8.9–10.3)
Chloride: 98 mmol/L (ref 98–111)
Creatinine, Ser: 6.47 mg/dL — ABNORMAL HIGH (ref 0.44–1.00)
GFR calc Af Amer: 8 mL/min — ABNORMAL LOW (ref >60)
GFR calc non Af Amer: 7 mL/min — ABNORMAL LOW (ref >60)
Glucose, Bld: 176 mg/dL — ABNORMAL HIGH (ref 70–99)
Phosphorus: 5.9 mg/dL — ABNORMAL HIGH (ref 2.5–4.6)
Potassium: 3.6 mmol/L (ref 3.5–5.1)
Sodium: 138 mmol/L (ref 135–145)

## 2019-12-11 LAB — HEPATITIS B CORE ANTIBODY, TOTAL: Hep B Core Total Ab: NONREACTIVE

## 2019-12-11 LAB — HEPATITIS B SURFACE ANTIGEN: Hepatitis B Surface Ag: NONREACTIVE

## 2019-12-11 MED ORDER — HYDRALAZINE HCL 25 MG PO TABS
50.0000 mg | ORAL_TABLET | Freq: Three times a day (TID) | ORAL | Status: DC
  Administered 2019-12-11 – 2019-12-13 (×5): 50 mg via ORAL
  Filled 2019-12-11 (×5): qty 2

## 2019-12-11 MED ORDER — CARVEDILOL 12.5 MG PO TABS
12.5000 mg | ORAL_TABLET | Freq: Two times a day (BID) | ORAL | Status: DC
  Administered 2019-12-11 – 2019-12-12 (×3): 12.5 mg via ORAL
  Filled 2019-12-11 (×2): qty 1
  Filled 2019-12-11: qty 4
  Filled 2019-12-11: qty 1

## 2019-12-11 MED ORDER — HEPARIN SODIUM (PORCINE) 1000 UNIT/ML DIALYSIS
3200.0000 [IU] | INTRAMUSCULAR | Status: DC | PRN
  Filled 2019-12-11 (×2): qty 4

## 2019-12-11 NOTE — Procedures (Signed)
     HEMODIALYSIS TREATMENT NOTE (HD#2):  Feels "pretty good" today.  Slept well.  Reported bleeding from cath insertion site last night after treatment.  Dressing with old, dried sanguinous drainage. Site care performed; new biopatch and occlusive dressing applied.   3 hour heparin-free treatment completed. Goal met: 2 liters removed without interruption in UF.  Cath tolerated prescribed flow with stable pressures, although lines had to be reversed again.  All blood was returned.  No changes from pre-dialysis assessment.  Rockwell Alexandria, RN.

## 2019-12-11 NOTE — Progress Notes (Signed)
Nephrology Follow-Up Consult Note [facility]  Assessment/Recommendations: [name] is a/an 54 y.o. female with a past medical history significant for CKD, anemia, seizure disorder, HTN, GERD, depression who present w/ HTN urgency and volume overload along with AKI.  AKI on CKD5, now ESRD, nonoliguric: Longstanding CKD w/ recent BL ~5 but fluctuates. Now ESRD and pt is accepting of starting HD especially in light of uremic symptoms.  HD start 9/21. Note: has underlying significant proteinuria. Along with significant family history of kidney disease/ESRD suspecting she has FSGS (for her- whether this is primary vs secondary/collapsing FSGS from HTN has yet to be determined). Has never had genetic testing or biopsy  TDC placed 9/21 with IR  vascular surgery on board for permanent access --s/p vein mapping. Plan for L[facility] 9/23 at [facility].   HD#2 today. Expect next HD treatment either tomorrow or the day after (timing dependent on access surgery). Slow start protocol  Continue diuretics for now  Will need to CLIP to [place] daily met panel/RFP  Volume Status: Improving, Cont diuretics, expecting to come off IV diuretics (possibly transition to PO +/- QOD dosing on off-HD days) as she gets more ultrafiltration  Hypertension, uncontrolled: hydralazine 65m tid added. Switching labetalol to coreg 12.573mbid. Continue with norvasc 102maily and diuretics. Expecting further improvement as she is getting more ultrafiltration   Secondary hyperparathyroidism: On calcitriol 1mc52maily.  Started sevelamer 800mg80m on 9/19. Trend phos+cal. Check pth  Hypocalcemia: on supplemental calcium. Check ionized calcium  Anemia due to CKD: Hemoglobin 8.8.  Iron sat 25 and ferritin 49 on 11/22/2019. Dosed feraheme x1 9/19. Will need 1gm total iron load. feraheme and will likely need Aranesp thereafter. Transfuse for hgb <7.  Anion gap metabolic acidosis: Cont NaHCO3 for now,  expecting this to be stopped once on a steady state on HD, monitor daily  Discussed with primary service.  [facility] 12/11/2019 8:11 AM  ___________________________________________________________  CC: AKI on CKD5  Interval History/Subjective:   S/p rij tdc placement via IR 9/21. S/p IHD #1 yesterday, tolerated well. S/p 1L net UF. Patient reports that her dysgeusia is better, still has some brain fog but better.   Medications:  Current Facility-Administered Medications  Medication Dose Route Frequency Provider Last Rate Last Admin  . 0.9 %  sodium chloride infusion  250 mL Intravenous PRN MaderBarton Dubois     . 0.9 %  sodium chloride infusion  100 mL Intravenous PRN SanfoPearson GrippeD      . 0.9 %  sodium chloride infusion  100 mL Intravenous PRN SanfoPearson GrippeD      . acetaminophen (TYLENOL) tablet 650 mg  650 mg Oral Q8H PRN MaderBarton Dubois  650 mg at 12/08/19 1309  . alteplase (CATHFLO ACTIVASE) injection 2 mg  2 mg Intracatheter Once PRN SanfoPearson GrippeD      . amLODipine (NORVASC) tablet 10 mg  10 mg Oral Daily SanfoPearson GrippeD   10 mg at 12/09/19 1104  . aspirin EC tablet 81 mg  81 mg Oral Q breakfast MaderBarton Dubois  81 mg at 12/11/19 0737  . calcitRIOL (ROCALTROL) capsule 1 mcg  1 mcg Oral Daily MaderBarton Dubois  1 mcg at 12/09/19 0900  . calcium carbonate (OS-CAL - dosed in mg of elemental calcium) tablet 1,000 mg of elemental calcium  2 tablet Oral BID WC MaderBarton Dubois  1,000 mg of elemental calcium at 12/11/19 0738  . calcium carbonate (TUMS - dosed in mg elemental calcium) chewable tablet 200 mg of elemental calcium  1 tablet Oral Q12H PRN Barton Dubois, MD   200 mg of elemental calcium at 12/07/19 1424  . Chlorhexidine Gluconate Cloth 2 % PADS 6 each  6 each Topical Daily Barton Dubois, MD   6 each at 12/09/19 [phone]  . Chlorhexidine Gluconate Cloth 2 % PADS 6 each  6 each Topical Q0600 Rexene Agent, MD   6  each at 12/11/19 [phone]  . ferric gluconate (NULECIT) 125 mg in sodium chloride 0.9 % 100 mL IVPB  125 mg Intravenous Daily Reesa Chew, MD   Paused at 12/09/19 1025  . furosemide (LASIX) 120 mg in dextrose 5 % 50 mL IVPB  120 mg Intravenous BID Reesa Chew, MD 62 mL/hr at 12/11/19 0611 120 mg at 12/11/19 0611  . heparin injection 1,000 Units  1,000 Units Dialysis PRN Rexene Agent, MD      . heparin injection 5,000 Units  5,000 Units Subcutaneous Q8H Barton Dubois, MD   5,000 Units at 12/11/19 [phone]  . hydrALAZINE (APRESOLINE) injection 10 mg  10 mg Intravenous Q6H PRN Barton Dubois, MD   10 mg at 12/09/19 0829  . HYDROmorphone (DILAUDID) injection 1 mg  1 mg Intravenous Q4H PRN Barton Dubois, MD   1 mg at 12/11/19 0737  . hydrOXYzine (ATARAX/VISTARIL) tablet 25 mg  25 mg Oral BID Barton Dubois, MD   25 mg at 12/10/19 2156  . labetalol (NORMODYNE) tablet 200 mg  200 mg Oral BID Barton Dubois, MD   200 mg at 12/10/19 2156  . levETIRAcetam (KEPPRA XR) 24 hr tablet 500 mg  500 mg Oral Daily Barton Dubois, MD   500 mg at 12/09/19 0904  . pantoprazole (PROTONIX) EC tablet 40 mg  40 mg Oral Daily Barton Dubois, MD   40 mg at 12/09/19 0900  . promethazine (PHENERGAN) injection 12.5 mg  12.5 mg Intravenous Q6H PRN Barton Dubois, MD   12.5 mg at 12/09/19 2152  . sevelamer carbonate (RENVELA) tablet 800 mg  800 mg Oral TID WC Reesa Chew, MD   800 mg at 12/11/19 0737  . sodium bicarbonate tablet 1,300 mg  1,300 mg Oral TID Reesa Chew, MD   1,300 mg at 12/10/19 2157  . sodium chloride flush (NS) 0.9 % injection 3 mL  3 mL Intravenous Q12H Barton Dubois, MD   3 mL at 12/09/19 2158  . sodium chloride flush (NS) 0.9 % injection 3 mL  3 mL Intravenous PRN Barton Dubois, MD      . temazepam (RESTORIL) capsule 15 mg  15 mg Oral QHS PRN Barton Dubois, MD   15 mg at 12/08/19 2135  . tiZANidine (ZANAFLEX) tablet 4 mg  4 mg Oral Q12H PRN Barton Dubois, MD      . venlafaxine XR  (EFFEXOR-XR) 24 hr capsule 150 mg  150 mg Oral Q breakfast Barton Dubois, MD   150 mg at 12/11/19 7616      Review of Systems: 10 systems reviewed and negative except per interval history/subjective  Physical Exam: Vitals:   12/11/19 0500 12/11/19 0600  BP:  (!) 171/117  Pulse:  82  Resp: 14 13  Temp:    SpO2:  97%   No intake/output data recorded.  Intake/Output Summary (Last 24 hours) at 12/11/2019 0737 Last data filed at 12/11/2019 0000 Gross per 24 hour  Intake 264.33 ml  Output 1910 ml  Net -1645.67 ml   Constitutional: well-appearing, no acute distress, sitting up in bed ENMT: ears and nose without scars or lesions, MMM CV: s1s2. rrr Respiratory: bilateral chest rise, normal work of breathing, cta bl, speaking in full sentences Gastrointestinal: soft, non-tender, nondistended, no palpable masses or hernias Skin: no visible lesions or rashes, RIJ TDC c/d/i Msk: no significant edema Neuro: speech clear and coherent, moves all ext spontaneously   Test Results I personally reviewed new and old clinical labs and radiology tests Lab Results  Component Value Date   NA 138 12/09/2019   K 3.9 12/09/2019   CL 104 12/09/2019   CO2 19 (L) 12/09/2019   BUN 77 (H) 12/09/2019   CREATININE 8.83 (H) 12/09/2019   GFR 30.27 (L) 02/15/2017   CALCIUM 8.0 (L) 12/09/2019   ALBUMIN 3.5 12/09/2019   PHOS 8.4 (H) 12/09/2019

## 2019-12-11 NOTE — Progress Notes (Signed)
Nicole NOTE    ODESSER Nicole  POE:[id number] DOB: October 18, 1965 DOA: 12/07/2019 PCP: Pixie Casino, MD   Brief Narrative:  Innocence Schlotzhauer Millneris a 54 y.o.femalewith medical history significant ofwith a past medical history significant for anemia of chronic disease, seizure disorders, hypertension, gastroesophageal flux disease, bipolar disorder/depression and end-stage renal disease (refusing hemodialysis); who presented to the hospital secondary to headaches, nausea and shortness of breath on exertion. Patient reports symptom has been present for the last 3-4 days and worsening. She denies any fever, hematemesis, hematuria, melena, hematochezia, abdominal pain, blurred vision, focal weakness or any other complaints. Reports to be compliant with her medications no recent changes to them. No sick contacts.  ED Course:Patient found in hypertensive crisis, chest x-ray demonstrating vascular congestion and with worsening in her renal function and BUN. Labetalol x5 doses given throughout her ED stay and subsequently due to failure improvement in her blood pressure was started on Cardene drip. CT scan of the head demonstrated no acute intracranial abnormalities.For her vascular congestion 100 mg of Lasix times once given. TRH consulted to admit patient for further evaluation and management.  9/22: Patient had her first hemodialysis session 9/21 with 1 L removed.  She is overall doing well this morning and will be transferred to telemetry with further plans for hemodialysis today.  Plans for permanent vascular access 9/23.  Assessment & Plan:   Active Problems:   Hypertensive crisis   ESRD (end stage renal disease) ([facility])   hypertensive crisis-improving -Baseline elevated blood pressure readings noted -With worsening renal function and headaches as part of endorgan damage. -Appreciate nephrology recommendations with hydralazine 50 mg 3 times daily and labetalol to Coreg.  Continue  Norvasc and diuretics. -continue renal/low sodium diet  -Further hemodialysis planned for today -Permanent vascular access planned for 9/23 -no CP, no nausea vomiting. -Expressed breathing is better overall -Continue close monitoring  nausea/vomiting -continue PRN antiemetics -continue PPI  end-stage renal disease -continue to follow recommendations by nephrology -With first hemodialysis session 9/21 plan for further hemodialysis today -Will follow response and nephrology service recommendation -Patient will require clipping process prior to discharge  history of seizure disorders -continue keppra.  -No seizure appreciated.  anemia of chronic kidney disease -iron and epogen therapy as per nephrology discretion   hypocalcemia -Continue calcitriol and elemental calcium supplementation. -follow electrolytes.  -Plan to check ionized calcium  metabolic acidosis in the setting of renal failure -Continue sodium bicarbonate -Nephrology to further assist with decisions about the needs of it, after hemodialysis initiated.  depression -no SI or hallucinations -continue effexor   DNR/DNI -Long discussions with patient about future intervention and anticipated care -Patient has expressed her wishes for HD currently, nephrology to further discussed with her about and assist with decisions on initiation and access.  -expressed still not wanting artifical life support, shocking or intubation.  -Wishes will be respected. -Long-term prognosis without HD very poor.  secondary hyperparathyroidism -Has been sent over melena   DVT prophylaxis:Heparin Code Status: DNR Family Communication: Patient will speak to family to include sister, none at bedside currently Disposition Plan:  Status is: Inpatient  Remains inpatient appropriate because:IV treatments appropriate due to intensity of illness or inability to take PO and Inpatient level of care appropriate due to severity of  illness   Dispo: The patient is from: Home              Anticipated d/c is to: Home  Anticipated d/c date is: > 3 days              Patient currently is not medically stable to d/c.  Consultants:   Nephrology  Vascular Surgery  Palliative Care  Procedures:   Tunneled HD catheter placement 9/21  See below  Antimicrobials:  Anti-infectives (From admission, onward)   Start     Dose/Rate Route Frequency Ordered Stop   12/10/19 1215  ceFAZolin (ANCEF) IVPB 2g/100 mL premix        2 g 200 mL/hr over 30 Minutes Intravenous  Once 12/10/19 1202 12/10/19 1220       Subjective: Patient seen and evaluated today with no new acute complaints or concerns. No acute concerns or events noted overnight.  She continues to have some elevated blood pressure readings, but tolerated her dialysis session well yesterday.  Objective: Vitals:   12/11/19 0300 12/11/19 0400 12/11/19 0500 12/11/19 0600  BP:  (!) 178/97  (!) 171/117  Pulse: 79 77  82  Resp: _0 Temp:  98.5 F (36.9 C)    TempSrc:      SpO2: 98% 98%  97%  Weight:    60.2 kg  Height:        Intake/Output Summary (Last 24 hours) at 12/11/2019 0805 Last data filed at 12/11/2019 0000 Gross per 24 hour  Intake 264.33 ml  Output 1910 ml  Net -1645.67 ml   Filed Weights   12/10/19 0500 12/10/19 1845 12/11/19 0600  Weight: 65.8 kg 65.9 kg 60.2 kg    Examination:  General exam: Appears calm and comfortable  Respiratory system: Clear to auscultation. Respiratory effort normal. Cardiovascular system: S1 & S2 heard, RRR.  Gastrointestinal system: Abdomen is nondistended, soft and nontender. Central nervous system: Alert and oriented. No focal neurological deficits. Extremities: Symmetric 5 x 5 power. Skin: No rashes, lesions or ulcers Psychiatry: Judgement and insight appear normal. Mood & affect appropriate.     Data Reviewed: I have personally reviewed following labs and imaging  studies  CBC: Recent Labs  Lab 12/07/19 0335 12/11/19 0319  WBC 4.5 4.7  NEUTROABS 3.2  --   HGB 8.8* 8.3*  HCT 28.7* 27.8*  MCV 93.8 93.0  PLT 209 702   Basic Metabolic Panel: Recent Labs  Lab 12/07/19 0335 12/07/19 0830 12/08/19 0548 12/09/19 1043  NA 137  --  140 138  K 4.4  --  3.9 3.9  CL 109  --  107 104  CO2 17*  --  17* 19*  GLUCOSE 103*  --  106* 81  BUN 76*  --  76* 77*  CREATININE 7.60*  --  8.04* 8.83*  CALCIUM 8.3*  --  8.6* 8.0*  MG  --  1.9  --   --   PHOS  --  7.2* 8.6* 8.4*   GFR: Estimated Creatinine Clearance: 5.8 mL/min (A) (by C-G formula based on SCr of 8.83 mg/dL (H)). Liver Function Tests: Recent Labs  Lab 12/07/19 0335 12/08/19 0548 12/09/19 1043  AST 13*  --   --   ALT 13  --   --   ALKPHOS 76  --   --   BILITOT 0.6  --   --   PROT 7.1  --   --   ALBUMIN 3.8 3.6 3.5   No results for input(s): LIPASE, AMYLASE in the last 168 hours. No results for input(s): AMMONIA in the last 168 hours. Coagulation Profile: Recent Labs  Lab 12/09/19 1705  INR 1.1   Cardiac Enzymes: No results for input(s): CKTOTAL, CKMB, CKMBINDEX, TROPONINI in the last 168 hours. BNP (last 3 results) No results for input(s): PROBNP in the last 8760 hours. HbA1C: No results for input(s): HGBA1C in the last 72 hours. CBG: No results for input(s): GLUCAP in the last 168 hours. Lipid Profile: No results for input(s): CHOL, HDL, LDLCALC, TRIG, CHOLHDL, LDLDIRECT in the last 72 hours. Thyroid Function Tests: No results for input(s): TSH, T4TOTAL, FREET4, T3FREE, THYROIDAB in the last 72 hours. Anemia Panel: No results for input(s): VITAMINB12, FOLATE, FERRITIN, TIBC, IRON, RETICCTPCT in the last 72 hours. Sepsis Labs: No results for input(s): PROCALCITON, LATICACIDVEN in the last 168 hours.  Recent Results (from the past 240 hour(s))  SARS Coronavirus 2 by RT PCR (hospital order, performed in [facility] hospital lab) Nasopharyngeal Nasopharyngeal Swab      Status: None   Collection Time: 12/07/19  6:37 AM   Specimen: Nasopharyngeal Swab  Result Value Ref Range Status   SARS Coronavirus 2 NEGATIVE NEGATIVE Final    Comment: (NOTE) SARS-CoV-2 target nucleic acids are NOT DETECTED.  The SARS-CoV-2 RNA is generally detectable in upper and lower respiratory specimens during the acute phase of infection. The lowest concentration of SARS-CoV-2 viral copies this assay can detect is 250 copies / mL. A negative result does not preclude SARS-CoV-2 infection and should not be used as the sole basis for treatment or other patient management decisions.  A negative result may occur with improper specimen collection / handling, submission of specimen other than nasopharyngeal swab, presence of viral mutation(s) within the areas targeted by this assay, and inadequate number of viral copies (<250 copies / mL). A negative result must be combined with clinical observations, patient history, and epidemiological information.  Fact Sheet for Patients:   StrictlyIdeas.no  Fact Sheet for Healthcare Providers: BankingDealers.co.za  This test is not yet approved or  cleared by the [place] FDA and has been authorized for detection and/or diagnosis of SARS-CoV-2 by FDA under an Emergency Use Authorization (EUA).  This EUA will remain in effect (meaning this test can be used) for the duration of the COVID-19 declaration under Section 564(b)(1) of the Act, 21 U.S.C. section 360bbb-3(b)(1), unless the authorization is terminated or revoked sooner.  Performed at [facility], [address], [place], [place] [address]   MRSA PCR Screening     Status: None   Collection Time: 12/07/19 10:07 AM   Specimen: Nasal Mucosa; Nasopharyngeal  Result Value Ref Range Status   MRSA by PCR NEGATIVE NEGATIVE Final    Comment:        The GeneXpert MRSA Assay (FDA approved for NASAL specimens only), is one component of  a comprehensive MRSA colonization surveillance program. It is not intended to diagnose MRSA infection nor to guide or monitor treatment for MRSA infections. Performed at [facility], [address], [place], [place] [address]          Radiology Studies: [place] UE VEIN MAPPING LEFT (PRE-OP AVF)  Result Date: 12/09/2019 CLINICAL DATA:  54 year old female, bilateral upper extremity vein mapping EXAM: [place] EXTREM UP VEIN MAPPING COMPARISON:  None. FINDINGS: RIGHT ARTERIES Wrist Radial Artery: Size 3.22m Waveform Triphasic Wrist Ulnar Artery: Size 2.679mWaveform Normal Prox. Forearm Radial Artery: Size 2.58m24maveform Normal Upper Arm Brachial Artery: High bifurcation of the brachial artery is noted. Size 4.1mm275madial), 3.8 mm (ulnar) waveform Triphasic RIGHT VEINS Forearm Cephalic Vein: Prox 2.75m3.8GTtal 4.4mm 60mth 3.2mm3.6IWr Arm Cephalic  Vein: Prox 4.773m Distal 4.559mDepth 2.8.8EKpper Arm Basilic Vein: Prox 4.8.0KListal 5.73m72mepth 7.8mm66mper Arm Brachial Vein: Prox 2.2mm 71mtal 2.2mm D38mh 15.6mm AD32mIONAL RIGHT VEINS Axillary Vein:  7.8mm Sub43mvian Vein: Patient: Yes Respiratory Phasicity: Present Internal Jugular Vein: Patent: Yes    Respiratory Phasicity: Present Branches > 2 mm: None LEFT ARTERIES Wrist Radial Artery: Size 3.18mm Wave273mm Triphasic Wrist Ulnar Artery: Size 1.2mm Wavef8m Normal Prox. Forearm Radial Artery: Size 3.1mm Wavefo64mTriphasic Upper Arm Brachial Artery: Size 4.73mm Wavefor22mriphasic LEFT VEINS Forearm Cephalic Vein: Prox 2.2mm Distal4.9ZPm Depth 3.58m Upper A9.1TAphalic Vein: Prox 3.9mm Distal 5.6PV Depth 2.274mUpper Ar9.4IAilic Vein: Prox 4.9mm Distal 51.6PVDepth 7.18m718mpper Arm B43mhial Vein: Prox 3.9mm Distal 5.18mm44mpth 11.3mm 53mITIONAL LEF81mEINS Axillary Vein:  12.4mm Subclavian Vein46matient: Yes Respiratory Phasicity: Present Internal Jugular Vein: Patent: Yes    Respiratory Phasicity: Present Branches > 2 mm: None IMPRESSION: 1. High bifurcation of the right  brachial artery is noted. 2. Patent bilateral upper extremity arteries and veins as described. Electronically Signed   By: Dylan  Suttle MD   ORuthann Cancer 16:22   IR US Guide Vasc AccessKoreaight  Result Date: 12/10/2019 INDICATION: 54 year old female w70h history of end-stage renal disease. Presents for tunneled hemodialysis catheter. EXAM: TUNNELED CENTRAL VENOUS HEMODIALYSIS CATHETER PLACEMENT WITH ULTRASOUND AND FLUOROSCOPIC GUIDANCE MEDICATIONS: Ancef 2 gm IV . The antibiotic was given in an appropriate time interval prior to skin puncture. ANESTHESIA/SEDATION: Moderate (conscious) sedation was employed during this procedure. A total of Versed 1.5 mg and Fentanyl 50 mcg was administered intravenously. Moderate Sedation Time: 15 minutes. The patient's level of consciousness and vital signs were monitored continuously by radiology nursing throughout the procedure under my direct supervision. FLUOROSCOPY TIME:  0 minutes 54 seconds (2 mGy). COMPLICATIONS: None immediate. PROCEDURE: Informed written consent was obtained from the patient after a discussion of the risks, benefits, and alternatives to treatment. Questions regarding the procedure were encouraged and answered. The right neck and chest were prepped with chlorhexidine in a sterile fashion, and a sterile drape was applied covering the operative field. Maximum barrier sterile technique with sterile gowns and gloves were used for the procedure. A timeout was performed prior to the initiation of the procedure. After creating a small venotomy incision, a 21 gauge micropuncture kit was utilized to access the internal jugular vein. Real-time ultrasound guidance was utilized for vascular access including the acquisition of a permanent ultrasound image documenting patency of the accessed vessel. A J wire was advanced to the level of the IVC and the micropuncture sheath was exchanged for an 8 Fr dilator. A 14.5 French tunneled hemodialysis catheter measuring 19  cm from tip to cuff was tunneled in a retrograde fashion from the anterior chest wall to the venotomy incision. Serial dilation was then performed an a peel-away sheath was placed. The catheter was then placed through the peel-away sheath with the catheter tip ultimately positioned within the right atrium. Final catheter positioning was confirmed and documented with a spot radiographic image. The catheter aspirates and flushes normally. The catheter was flushed with appropriate volume heparin dwells. The catheter exit site was secured with a 0-Silk retention suture. The venotomy incision was closed with Dermabond. Sterile dressings were applied. The patient tolerated the procedure well without immediate post procedural complication. IMPRESSION: Successful placement of 19 cm tip to cuff tunneled hemodialysis catheter via the right internal jugular vein with catheter tip terminating within the right atrium. The catheter is  ready for immediate use. Electronically Signed   By: Ruthann Cancer MD   On: 12/10/2019 13:21   [place] UE VEIN MAPPING RIGHT (PRE-OP AVF)  Result Date: 12/10/2019 CLINICAL DATA:  54 year old female, bilateral upper extremity vein mapping EXAM: [place] EXTREM UP VEIN MAPPING COMPARISON:  None. FINDINGS: RIGHT ARTERIES Wrist Radial Artery: Size 3.58m Waveform Triphasic Wrist Ulnar Artery: Size 2.6823mWaveform Normal Prox. Forearm Radial Artery: Size 2.50m250maveform Normal Upper Arm Brachial Artery: High bifurcation of the brachial artery is noted. Size 4.1mm59madial), 3.8 mm (ulnar) waveform Triphasic RIGHT VEINS Forearm Cephalic Vein: Prox 2.23m9.6EAtal 4.4mm 71mth 3.2mm5.4UJr Arm Cephalic Vein: Prox 4.23mm8.1XBal 4.23mm D29mh 2.8mm 1.4NW Arm Basilic Vein: Prox 4.8mm 2.9FAl 5.23mm De150m 7.8mm Upp16mArm Brachial Vein: Prox 2.2mm Dist13m2.2mm Depth42m.6mm ADDITI50mL RIGHT VEINS Axillary Vein:  7.8mm Subclav29m Vein: Patient: Yes Respiratory Phasicity: Present Internal Jugular Vein: Patent: Yes    Respiratory  Phasicity: Present Branches > 2 mm: None LEFT ARTERIES Wrist Radial Artery: Size 3.0mm Waveform31miphasic Wrist Ulnar Artery: Size 1.2mm Waveform 58mmal Prox. Forearm Radial Artery: Size 3.1mm Waveform T49mhasic Upper Arm Brachial Artery: Size 4.23mm Waveform Tr61masic LEFT VEINS Forearm Cephalic Vein: Prox 2.2mm Distal 4.12.1HYpth 3.50mm 33mer Arm C8.6VHic Vein: Prox 3.9mm Distal 4.23m8.4ONth 2.2mm U33mr Arm Ba6.2XB Vein: Prox 4.9mm Distal 5.4mm2.8UXh 7.0mm Up39m Arm Brach350m Vein: Prox 3.9mm Distal 5.0mm Dep59m11.3mm ADDI350mNAL LEFT VE36m Axillary Vein:  12.4mm Subclavian Vein: Pa46mnt: Yes Respiratory Phasicity: Present Internal Jugular Vein: Patent: Yes    Respiratory Phasicity: Present Branches > 2 mm: None IMPRESSION: 1. High bifurcation of the right brachial artery is noted. 2. Patent bilateral upper extremity arteries and veins as described. Electronically Signed   By: Dylan  Suttle MD   On: 0Ruthann Cancer22   IR TUNNELED CENTRAL VENOUS CATHETER PLACEMENT  Result Date: 12/10/2019 INDICATION: 54 year old female with 71story of end-stage renal disease. Presents for tunneled hemodialysis catheter. EXAM: TUNNELED CENTRAL VENOUS HEMODIALYSIS CATHETER PLACEMENT WITH ULTRASOUND AND FLUOROSCOPIC GUIDANCE MEDICATIONS: Ancef 2 gm IV . The antibiotic was given in an appropriate time interval prior to skin puncture. ANESTHESIA/SEDATION: Moderate (conscious) sedation was employed during this procedure. A total of Versed 1.5 mg and Fentanyl 50 mcg was administered intravenously. Moderate Sedation Time: 15 minutes. The patient's level of consciousness and vital signs were monitored continuously by radiology nursing throughout the procedure under my direct supervision. FLUOROSCOPY TIME:  0 minutes 54 seconds (2 mGy). COMPLICATIONS: None immediate. PROCEDURE: Informed written consent was obtained from the patient after a discussion of the risks, benefits, and alternatives to treatment. Questions regarding the procedure were  encouraged and answered. The right neck and chest were prepped with chlorhexidine in a sterile fashion, and a sterile drape was applied covering the operative field. Maximum barrier sterile technique with sterile gowns and gloves were used for the procedure. A timeout was performed prior to the initiation of the procedure. After creating a small venotomy incision, a 21 gauge micropuncture kit was utilized to access the internal jugular vein. Real-time ultrasound guidance was utilized for vascular access including the acquisition of a permanent ultrasound image documenting patency of the accessed vessel. A J wire was advanced to the level of the IVC and the micropuncture sheath was exchanged for an 8 Fr dilator. A 14.5 French tunneled hemodialysis catheter measuring 19 cm from tip to cuff was tunneled in a retrograde fashion from the anterior chest wall to the venotomy incision. Serial dilation was then performed an a peel-away sheath was  placed. The catheter was then placed through the peel-away sheath with the catheter tip ultimately positioned within the right atrium. Final catheter positioning was confirmed and documented with a spot radiographic image. The catheter aspirates and flushes normally. The catheter was flushed with appropriate volume heparin dwells. The catheter exit site was secured with a 0-Silk retention suture. The venotomy incision was closed with Dermabond. Sterile dressings were applied. The patient tolerated the procedure well without immediate post procedural complication. IMPRESSION: Successful placement of 19 cm tip to cuff tunneled hemodialysis catheter via the right internal jugular vein with catheter tip terminating within the right atrium. The catheter is ready for immediate use. Electronically Signed   By: Ruthann Cancer MD   On: 12/10/2019 13:21        Scheduled Meds: . amLODipine  10 mg Oral Daily  . aspirin EC  81 mg Oral Q breakfast  . calcitRIOL  1 mcg Oral Daily  .  calcium carbonate  2 tablet Oral BID WC  . Chlorhexidine Gluconate Cloth  6 each Topical Daily  . Chlorhexidine Gluconate Cloth  6 each Topical Q0600  . heparin  5,000 Units Subcutaneous Q8H  . hydrOXYzine  25 mg Oral BID  . labetalol  200 mg Oral BID  . levETIRAcetam  500 mg Oral Daily  . pantoprazole  40 mg Oral Daily  . sevelamer carbonate  800 mg Oral TID WC  . sodium bicarbonate  1,300 mg Oral TID  . sodium chloride flush  3 mL Intravenous Q12H  . venlafaxine XR  150 mg Oral Q breakfast   Continuous Infusions: . sodium chloride    . sodium chloride    . sodium chloride    . ferric gluconate (FERRLECIT/NULECIT) IV Stopped (12/09/19 1025)  . furosemide 120 mg (12/11/19 0611)     LOS: 4 days    Time spent: 35 minutes    Gerrett Loman D Manuella Ghazi, DO [facility]  If 7PM-7AM, please contact night-coverage [email or web address] 12/11/2019, 8:05 AM

## 2019-12-11 NOTE — TOC Progression Note (Addendum)
Transition of Care [facility]) - Progression Note    Patient Details  Name: [name] MRN: [id number] Date of Birth: [birth year]  [facility]) CM/SW Contact  Boneta Lucks, RN Phone Number: 12/11/2019, 1:16 PM  Clinical Narrative:   TOC confirmed [facility] is working on a chair time.  They are waiting on labs, which has resulted. [facility] will update [place] with a chair time.   Addendum :  Chair time  MWF at 12:30 patient to arrive at 12:15, patient does not qualify for transportation.  Patient is aware of time and has someone to take he to dialysis.  Added to AVS   Expected Discharge Plan: [place] Barriers to Discharge: Continued Medical Work up  Expected Discharge Plan and Services Expected Discharge Plan: [place] arrangements for the past 2 months: Apartment

## 2019-12-12 ENCOUNTER — Inpatient Hospital Stay (HOSPITAL_COMMUNITY): Payer: Medicare Other | Admitting: Anesthesiology

## 2019-12-12 ENCOUNTER — Encounter (HOSPITAL_COMMUNITY): Payer: Self-pay | Admitting: Family Medicine

## 2019-12-12 ENCOUNTER — Other Ambulatory Visit: Payer: Self-pay

## 2019-12-12 ENCOUNTER — Encounter (HOSPITAL_COMMUNITY)
Admission: EM | Disposition: A | Discharge status: Home Health | Payer: Self-pay | Source: Home / Self Care | Attending: Internal Medicine

## 2019-12-12 DIAGNOSIS — N184 Chronic kidney disease, stage 4 (severe): Secondary | ICD-10-CM

## 2019-12-12 DIAGNOSIS — N185 Chronic kidney disease, stage 5: Secondary | ICD-10-CM

## 2019-12-12 HISTORY — PX: AV FISTULA PLACEMENT: SHX1204

## 2019-12-12 LAB — BASIC METABOLIC PANEL
Anion gap: 12 (ref 5–15)
BUN: 34 mg/dL — ABNORMAL HIGH (ref 6–20)
CO2: 28 mmol/L (ref 22–32)
Calcium: 8.8 mg/dL — ABNORMAL LOW (ref 8.9–10.3)
Chloride: 96 mmol/L — ABNORMAL LOW (ref 98–111)
Creatinine, Ser: 4.81 mg/dL — ABNORMAL HIGH (ref 0.44–1.00)
GFR calc Af Amer: 11 mL/min — ABNORMAL LOW (ref >60)
GFR calc non Af Amer: 10 mL/min — ABNORMAL LOW (ref >60)
Glucose, Bld: 84 mg/dL (ref 70–99)
Potassium: 3.1 mmol/L — ABNORMAL LOW (ref 3.5–5.1)
Sodium: 136 mmol/L (ref 135–145)

## 2019-12-12 LAB — CBC
HCT: 32.2 % — ABNORMAL LOW (ref 36.0–46.0)
Hemoglobin: 9.7 g/dL — ABNORMAL LOW (ref 12.0–15.0)
MCH: 28.4 pg (ref 26.0–34.0)
MCHC: 30.1 g/dL (ref 30.0–36.0)
MCV: 94.4 fL (ref 80.0–100.0)
Platelets: 191 10*3/uL (ref 150–400)
RBC: 3.41 MIL/uL — ABNORMAL LOW (ref 3.87–5.11)
RDW: 16.8 % — ABNORMAL HIGH (ref 11.5–15.5)
WBC: 5.1 10*3/uL (ref 4.0–10.5)
nRBC: 0 % (ref 0.0–0.2)

## 2019-12-12 LAB — CALCIUM, IONIZED: Calcium, Ionized, Serum: 4.4 mg/dL — ABNORMAL LOW (ref 4.5–5.6)

## 2019-12-12 LAB — PTH, INTACT AND CALCIUM
Calcium, Total (PTH): 8 mg/dL — ABNORMAL LOW (ref 8.7–10.2)
PTH: 229 pg/mL — ABNORMAL HIGH (ref 15–65)

## 2019-12-12 LAB — HEPATITIS B SURFACE ANTIBODY, QUANTITATIVE: Hep B S AB Quant (Post): <3.1 m[IU]/mL — ABNORMAL LOW (ref >9.9)

## 2019-12-12 SURGERY — ARTERIOVENOUS (AV) FISTULA CREATION
Anesthesia: General | Site: Arm Lower | Laterality: Left

## 2019-12-12 MED ORDER — PHENYLEPHRINE HCL (PRESSORS) 10 MG/ML IV SOLN
INTRAVENOUS | Status: DC | PRN
  Administered 2019-12-12: 100 ug via INTRAVENOUS
  Administered 2019-12-12: 120 ug via INTRAVENOUS
  Administered 2019-12-12: 80 ug via INTRAVENOUS
  Administered 2019-12-12 (×3): 100 ug via INTRAVENOUS

## 2019-12-12 MED ORDER — SODIUM CHLORIDE 0.9 % IV SOLN: INTRAVENOUS | Status: DC | PRN

## 2019-12-12 MED ORDER — FENTANYL CITRATE (PF) 100 MCG/2ML IJ SOLN
INTRAMUSCULAR | Status: AC
  Filled 2019-12-12: qty 2

## 2019-12-12 MED ORDER — 0.9 % SODIUM CHLORIDE (POUR BTL) OPTIME
TOPICAL | Status: DC | PRN
  Administered 2019-12-12: 1000 mL

## 2019-12-12 MED ORDER — LIDOCAINE-EPINEPHRINE 0.5 %-1:200000 IJ SOLN
INTRAMUSCULAR | Status: AC
  Filled 2019-12-12: qty 1

## 2019-12-12 MED ORDER — HEPARIN SODIUM (PORCINE) 1000 UNIT/ML IJ SOLN
INTRAMUSCULAR | Status: AC
  Filled 2019-12-12: qty 6

## 2019-12-12 MED ORDER — ORAL CARE MOUTH RINSE: 15.0000 mL | Freq: Once | OROMUCOSAL | Status: AC

## 2019-12-12 MED ORDER — SODIUM CHLORIDE 0.9 % IV SOLN
INTRAVENOUS | Status: DC | PRN
  Administered 2019-12-12: 500 mL

## 2019-12-12 MED ORDER — CEFAZOLIN SODIUM-DEXTROSE 2-4 GM/100ML-% IV SOLN
INTRAVENOUS | Status: AC
  Filled 2019-12-12: qty 100

## 2019-12-12 MED ORDER — FENTANYL CITRATE (PF) 100 MCG/2ML IJ SOLN
INTRAMUSCULAR | Status: DC | PRN
  Administered 2019-12-12 (×2): 50 ug via INTRAVENOUS

## 2019-12-12 MED ORDER — SODIUM CHLORIDE 0.9 % IV SOLN: INTRAVENOUS | Status: DC

## 2019-12-12 MED ORDER — LIDOCAINE-EPINEPHRINE 0.5 %-1:200000 IJ SOLN
INTRAMUSCULAR | Status: DC | PRN
  Administered 2019-12-12: 7 mL

## 2019-12-12 MED ORDER — CHLORHEXIDINE GLUCONATE 0.12 % MT SOLN
15.0000 mL | Freq: Once | OROMUCOSAL | Status: AC
  Administered 2019-12-12: 15 mL via OROMUCOSAL
  Filled 2019-12-12: qty 15

## 2019-12-12 MED ORDER — PROPOFOL 500 MG/50ML IV EMUL
INTRAVENOUS | Status: DC | PRN
  Administered 2019-12-12: 80 ug/kg/min via INTRAVENOUS

## 2019-12-12 MED ORDER — FUROSEMIDE 80 MG PO TABS
80.0000 mg | ORAL_TABLET | Freq: Two times a day (BID) | ORAL | Status: DC
  Administered 2019-12-12: 80 mg via ORAL
  Filled 2019-12-12 (×2): qty 1

## 2019-12-12 MED ORDER — OXYCODONE-ACETAMINOPHEN 5-325 MG PO TABS
1.0000 | ORAL_TABLET | ORAL | Status: DC | PRN
  Filled 2019-12-12: qty 1

## 2019-12-12 MED ORDER — PROPOFOL 10 MG/ML IV BOLUS
INTRAVENOUS | Status: AC
  Filled 2019-12-12: qty 80

## 2019-12-12 MED ORDER — CEFAZOLIN SODIUM-DEXTROSE 2-3 GM-%(50ML) IV SOLR
INTRAVENOUS | Status: DC | PRN
  Administered 2019-12-12: 2 g via INTRAVENOUS

## 2019-12-12 SURGICAL SUPPLY — 38 items
ARMBAND PINK RESTRICT EXTREMIT (MISCELLANEOUS) ×4 IMPLANT
BAG HAMPER (MISCELLANEOUS) ×3 IMPLANT
CANNULA VESSEL 3MM 2 BLNT TIP (CANNULA) ×3 IMPLANT
CLIP LIGATING EXTRA MED SLVR (CLIP) ×3 IMPLANT
CLIP LIGATING EXTRA SM BLUE (MISCELLANEOUS) ×3 IMPLANT
COVER LIGHT HANDLE STERIS (MISCELLANEOUS) ×6 IMPLANT
COVER MAYO STAND XLG (MISCELLANEOUS) ×3 IMPLANT
COVER PROBE W GEL 5X96 (DRAPES) ×3 IMPLANT
COVER WAND RF STERILE (DRAPES) ×3 IMPLANT
DECANTER SPIKE VIAL GLASS SM (MISCELLANEOUS) ×3 IMPLANT
DERMABOND ADVANCED (GAUZE/BANDAGES/DRESSINGS) ×2
DERMABOND ADVANCED .7 DNX12 (GAUZE/BANDAGES/DRESSINGS) ×1 IMPLANT
ELECT REM PT RETURN 9FT ADLT (ELECTROSURGICAL) ×3
ELECTRODE REM PT RTRN 9FT ADLT (ELECTROSURGICAL) ×1 IMPLANT
GLOVE BIOGEL PI IND STRL 7.0 (GLOVE) ×3 IMPLANT
GLOVE BIOGEL PI INDICATOR 7.0 (GLOVE) ×6
GLOVE SS BIOGEL STRL SZ 7.5 (GLOVE) ×1 IMPLANT
GLOVE SUPERSENSE BIOGEL SZ 7.5 (GLOVE) ×2
GLOVE SURG SS PI 6.5 STRL IVOR (GLOVE) ×3 IMPLANT
GOWN STRL REUS W/ TWL LRG LVL3 (GOWN DISPOSABLE) ×3 IMPLANT
GOWN STRL REUS W/TWL LRG LVL3 (GOWN DISPOSABLE) ×9 IMPLANT
IV NS 500ML (IV SOLUTION) ×3
IV NS 500ML BAXH (IV SOLUTION) ×2 IMPLANT
KIT BLADEGUARD II DBL (SET/KITS/TRAYS/PACK) ×3 IMPLANT
KIT TURNOVER KIT A (KITS) ×3 IMPLANT
MANIFOLD NEPTUNE II (INSTRUMENTS) ×3 IMPLANT
MARKER SKIN DUAL TIP RULER LAB (MISCELLANEOUS) ×6 IMPLANT
NS IRRIG 1000ML POUR BTL (IV SOLUTION) ×3 IMPLANT
PACK CV ACCESS (CUSTOM PROCEDURE TRAY) ×3 IMPLANT
PAD ARMBOARD 7.5X6 YLW CONV (MISCELLANEOUS) ×6 IMPLANT
SET BASIN LINEN APH (SET/KITS/TRAYS/PACK) ×3 IMPLANT
SOL PREP POV-IOD 4OZ 10% (MISCELLANEOUS) ×1 IMPLANT
SOL PREP PROV IODINE SCRUB 4OZ (MISCELLANEOUS) ×3 IMPLANT
SUT PROLENE 6 0 CC (SUTURE) ×4 IMPLANT
SUT VIC AB 3-0 SH 27 (SUTURE) ×3
SUT VIC AB 3-0 SH 27X BRD (SUTURE) ×1 IMPLANT
TOWEL GREEN STERILE (TOWEL DISPOSABLE) ×3 IMPLANT
UNDERPAD 30X36 HEAVY ABSORB (UNDERPADS AND DIAPERS) ×3 IMPLANT

## 2019-12-12 NOTE — Progress Notes (Signed)
Patient in OR today. Unable to be seen and examined today. Chart reviewed, coordinated with primary service.  Nephrology Follow-Up Consult Note [facility]  Assessment/Recommendations: [name] is a/an 54 y.o. female with a past medical history significant for CKD, anemia, seizure disorder, HTN, GERD, depression who present w/ HTN urgency and volume overload along with AKI.  AKI on CKD5, now ESRD, nonoliguric: Longstanding CKD w/ recent BL ~5 but fluctuates. Now ESRD and pt is accepting of starting HD especially in light of uremic symptoms.  HD start 9/21.  [place] placed 9/21 with IR  vascular surgery on board for permanent access -currently in OR for permanent access: LUE AVF 9/23  HD#3 tomorrow. Slow start protocol. 4k 2.5cal bath tomorrow  Continue diuretics for now, transitioning to PO  Outpatient HD plan: [facility] MWF 2nd shift: 12:30pm  Needs daily met panel/RFP  Volume Status, improved: changed diuretics to lasix 58m PO BID. If signs of volume overload/resp distress can given lasix 1231miv x 1 dose  Hypertension, improved: hydralazine 5081mid added. Switched labetalol to coreg 12.5mg49md. Continue with norvasc 10mg53mly and diuretics (changed to PO)  Secondary hyperparathyroidism: Phos improved. On calcitriol 1mcg 17mly.  Started sevelamer 800mg T2mn 9/19. Trend phos+cal. Check pth  Hypocalcemia: on supplemental calcium. Check ionized calcium  Anemia due to CKD, improved: Hemoglobin 9.7.  Iron sat 25 and ferritin 49 on 11/22/2019. Dosed feraheme x1 9/19. c/w feraheme and will likely need Aranesp thereafter. Transfuse for hgb <7.  Anion gap metabolic acidosis: improved, stopping nahco3 given improvement on HD  Discussed with primary service.  [facility] 12/12/2019 8:11 AM  ___________________________________________________________  CC: AKI on CKD5  Interval History/Subjective:   Patient in OR unable to be  seen or examined. Labs reviewed. Tolerated HD yesterday, UF 2L yesterday   Medications:  Current Facility-Administered Medications  Medication Dose Route Frequency Provider Last Rate Last Admin  . [MAR Hold] 0.9 %  sodium chloride infusion  250 mL Intravenous PRN Madera,Barton Dubois   . [MAR HoDoug Sou0.9 %  sodium chloride infusion  100 mL Intravenous PRN SanfordRexene Agent   . [MAR HoDoug Sou0.9 %  sodium chloride infusion  100 mL Intravenous PRN SanfordPearson Grippe     . 0.9 %  sodium chloride infusion   Intravenous Continuous BattulaDenese Killings mL/hr at 12/12/19 0712 [id number] at 12/12/19 0712  .0175 % irrigation (POUR BTL)    PRN Early, Rosetta Posner1,000 mL at 12/12/19 0810  . [MAR Hold] acetaminophen (TYLENOL) tablet 650 mg  650 mg Oral Q8H PRN Madera,Barton Dubois650 mg at 12/12/19 0446  . [MAR Hold] alteplase (CATHFLO ACTIVASE) injection 2 mg  2 mg Intracatheter Once PRN SanfordRexene Agent   . [MAR HoDoug SouamLODipine (NORVASC) tablet 10 mg  10 mg Oral Daily SanfordPearson Grippe  10 mg at 12/11/19 0956  . [MAR Hold] aspirin EC tablet 81 mg  81 mg Oral Q breakfast Madera,Barton Dubois81 mg at 12/11/19 0737  . [MAR Hold] calcitRIOL (ROCALTROL) capsule 1 mcg  1 mcg Oral Daily Madera,Barton Dubois1 mcg at 12/11/19 0958  [phone] Hold] calcium carbonate (OS-CAL - dosed in mg of elemental calcium) tablet 1,000 mg of elemental calcium  2 tablet Oral BID WC Madera,Barton Dubois  1,000 mg of elemental calcium at 12/11/19 1749  . [MAR Hold] calcium carbonate (TUMS - dosed in mg elemental calcium) chewable tablet 200 mg of elemental calcium  1 tablet Oral Q12H PRN Barton Dubois, MD   200 mg of elemental calcium at 12/07/19 1424  . [MAR Hold] carvedilol (COREG) tablet 12.5 mg  12.5 mg Oral BID WC Gean Quint, MD   12.5 mg at 12/11/19 1749  . [MAR Hold] Chlorhexidine Gluconate Cloth 2 % PADS 6 each  6 each Topical Daily Barton Dubois, MD   6 each at 12/11/19 0957  . [MAR Hold] Chlorhexidine  Gluconate Cloth 2 % PADS 6 each  6 each Topical Q0600 Rexene Agent, MD   6 each at 12/12/19 [phone]  . ferric gluconate (NULECIT) 125 mg in sodium chloride 0.9 % 100 mL IVPB  125 mg Intravenous Daily Reesa Chew, MD 110 mL/hr at 12/11/19 1000 125 mg at 12/11/19 1000  . [MAR Hold] furosemide (LASIX) 120 mg in dextrose 5 % 50 mL IVPB  120 mg Intravenous BID Reesa Chew, MD 62 mL/hr at 12/12/19 0549 120 mg at 12/12/19 0549  . [MAR Hold] heparin injection 3,200 Units  3,200 Units Dialysis PRN Gean Quint, MD      . Doug Sou Hold] heparin injection 5,000 Units  5,000 Units Subcutaneous Q8H Barton Dubois, MD   5,000 Units at 12/11/19 2129  . [MAR Hold] hydrALAZINE (APRESOLINE) injection 10 mg  10 mg Intravenous Q6H PRN Barton Dubois, MD   10 mg at 12/09/19 0829  . [MAR Hold] hydrALAZINE (APRESOLINE) tablet 50 mg  50 mg Oral Q8H Gean Quint, MD   50 mg at 12/11/19 2128  . [MAR Hold] HYDROmorphone (DILAUDID) injection 1 mg  1 mg Intravenous Q4H PRN Barton Dubois, MD   1 mg at 12/11/19 2129  . [MAR Hold] hydrOXYzine (ATARAX/VISTARIL) tablet 25 mg  25 mg Oral BID Barton Dubois, MD   25 mg at 12/11/19 2128  . [MAR Hold] levETIRAcetam (KEPPRA XR) 24 hr tablet 500 mg  500 mg Oral Daily Barton Dubois, MD   500 mg at 12/11/19 0959  . [MAR Hold] pantoprazole (PROTONIX) EC tablet 40 mg  40 mg Oral Daily Barton Dubois, MD   40 mg at 12/11/19 1004  . [MAR Hold] promethazine (PHENERGAN) injection 12.5 mg  12.5 mg Intravenous Q6H PRN Barton Dubois, MD   12.5 mg at 12/09/19 2152  . [MAR Hold] sevelamer carbonate (RENVELA) tablet 800 mg  800 mg Oral TID WC Reesa Chew, MD   800 mg at 12/11/19 1749  . [MAR Hold] sodium bicarbonate tablet 1,300 mg  1,300 mg Oral TID Reesa Chew, MD   1,300 mg at 12/11/19 2128  . [MAR Hold] sodium chloride flush (NS) 0.9 % injection 3 mL  3 mL Intravenous Q12H Barton Dubois, MD   3 mL at 12/11/19 2129  . [MAR Hold] sodium chloride flush (NS) 0.9 % injection 3 mL   3 mL Intravenous PRN Barton Dubois, MD      . Doug Sou Hold] temazepam (RESTORIL) capsule 15 mg  15 mg Oral QHS PRN Barton Dubois, MD   15 mg at 12/11/19 2128  . [MAR Hold] tiZANidine (ZANAFLEX) tablet 4 mg  4 mg Oral Q12H PRN Barton Dubois, MD      . Doug Sou Hold] venlafaxine XR (EFFEXOR-XR) 24 hr capsule 150 mg  150 mg Oral Q breakfast Barton Dubois, MD   150 mg at 12/11/19 2423   Facility-Administered Medications Ordered  in Other Encounters  Medication Dose Route Frequency Provider Last Rate Last Admin  . fentaNYL (SUBLIMAZE) injection   Intravenous Anesthesia Intra-op Jonna Munro, CRNA   50 mcg at 12/12/19 9037  . phenylephrine (NEO-SYNEPHRINE) injection   Intravenous Anesthesia Intra-op Jonna Munro, CRNA   100 mcg at 12/12/19 0800  . propofol (DIPRIVAN) 500 MG/50ML infusion   Intravenous Continuous PRN Jonna Munro, CRNA 28.896 mL/hr at 12/12/19 0736 80 mcg/kg/min at 12/12/19 0736      Review of Systems: 10 systems reviewed and negative except per interval history/subjective  Physical Exam: Vitals:   12/12/19 0400 12/12/19 0623  BP:  (!) 129/94  Pulse:  95  Resp:  15  Temp: 97.9 F (36.6 C) 98.5 F (36.9 C)  SpO2:  96%   No intake/output data recorded.  Intake/Output Summary (Last 24 hours) at 12/12/2019 9558 Last data filed at 12/11/2019 1700 Gross per 24 hour  Intake --  Output 2007 ml  Net -2007 ml   Patient in OR unable to be seen or examined.   Test Results I personally reviewed new and old clinical labs and radiology tests Lab Results  Component Value Date   NA 136 12/12/2019   K 3.1 (L) 12/12/2019   CL 96 (L) 12/12/2019   CO2 28 12/12/2019   BUN 34 (H) 12/12/2019   CREATININE 4.81 (H) 12/12/2019   GFR 30.27 (L) 02/15/2017   CALCIUM 8.8 (L) 12/12/2019   ALBUMIN 3.8 12/11/2019   PHOS 5.9 (H) 12/11/2019

## 2019-12-12 NOTE — Progress Notes (Signed)
PROGRESS NOTE    SHANICKA OLDENKAMP  GPQ:[id number] DOB: [birth year] DOA: 12/07/2019 PCP: Pixie Casino, MD   Brief Narrative:  Elfida Shimada Millneris a 54 y.o.femalewith medical history significant ofwith a past medical history significant for anemia of chronic disease, seizure disorders, hypertension, gastroesophageal flux disease, bipolar disorder/depression and end-stage renal disease (refusing hemodialysis); who presented to the hospital secondary to headaches, nausea and shortness of breath on exertion. Patient reports symptom has been present for the last 3-4 days and worsening. She denies any fever, hematemesis, hematuria, melena, hematochezia, abdominal pain, blurred vision, focal weakness or any other complaints. Reports to be compliant with her medications no recent changes to them. No sick contacts.  ED Course:Patient found in hypertensive crisis, chest x-ray demonstrating vascular congestion and with worsening in her renal function and BUN. Labetalol x5 doses given throughout her ED stay and subsequently due to failure improvement in her blood pressure was started on Cardene drip. CT scan of the head demonstrated no acute intracranial abnormalities.For her vascular congestion 100 mg of Lasix times once given. TRH consulted to admit patient for further evaluation and management.  9/22: Patient had her first hemodialysis session 9/21 with 1 L removed.  She is overall doing well this morning and will be transferred to telemetry with further plans for hemodialysis today.  Plans for permanent vascular access 9/23.  9/23: Patient has undergone left upper extremity brachiocephalic fistula placement with plans to undergo further hemodialysis on 9/24.  Blood pressure is much improved.  Assessment & Plan:   Active Problems:   Hypertensive crisis   ESRD (end stage renal disease) ([place])   hypertensive crisis-resolved -Baseline elevated blood pressure readings noted -With  worsening renal function and headaches as part of endorgan damage. -Appreciate nephrology recommendations with hydralazine 50 mg 3 times daily and labetalol to Coreg.  Continue Norvasc and diuretics. -continue renal/low sodium diet  -Further hemodialysis planned for 9/24 -no CP, no nausea vomiting. -Continue close monitoring  nausea/vomiting -continue PRN antiemetics -continue PPI  end-stage renal disease -Status post left upper extremity brachiocephalic AV fistula on 0/94 -continue to follow recommendations by nephrology -With first hemodialysis session 9/21 plan for further hemodialysis on 9/24 -Patient has hemodialysis chair MWF and will anticipate discharge by tomorrow.  history of seizure disorders -continue keppra.  -No seizure appreciated.  anemia of chronic kidney disease -iron and epogen therapy as per nephrology discretion  -Transfuse for hemoglobin less than 7  hypocalcemia -Continue calcitriol and elemental calcium supplementation. -follow electrolytes.  -Plan to check ionized calcium  metabolic acidosis in the setting of renal failure -Improved with no further need for sodium bicarbonate  depression -no SI or hallucinations -continue effexor   DNR/DNI -Long discussions with patient about future intervention and anticipated care -Patient has expressed her wishes for HD currently, nephrology to further discussed with her about and assist with decisions on initiation and access.  -expressed still not wanting artifical life support, shocking or intubation.  -Wishes will be respected. -Long-term prognosis without HD very poor.  secondary hyperparathyroidism -Started on calcitriol daily -Started on sevelamer 800 mg 3 times daily   DVT prophylaxis:Heparin Code Status: DNR Family Communication: Patient will speak to family to include sister, none at bedside currently Disposition Plan:  Status is: Inpatient  Remains inpatient appropriate  because:IV treatments appropriate due to intensity of illness or inability to take PO and Inpatient level of care appropriate due to severity of illness   Dispo: The patient is from: Home  Anticipated d/c is  to: Home  Anticipated d/c date is: 1 days  Patient currently is not medically stable to d/c.  She requires a trial of hemodialysis in a.m. prior to discharge tomorrow.  Consultants:   Nephrology  Vascular Surgery  Palliative Care  Procedures:   Tunneled HD catheter placement 9/21  L UE brachiocephalic AV fistula 6/62  See below  Antimicrobials:   None   Subjective: Patient seen and evaluated today with no new acute complaints or concerns. No acute concerns or events noted overnight.  She has undergone AV fistula placement to left upper extremity with no complications noted.  Objective: Vitals:   12/12/19 0915 12/12/19 0930 12/12/19 0943 12/12/19 1020  BP: 115/82 127/88  118/71  Pulse: 80     Resp: 12 16 12 14   Temp:  98 F (36.7 C)    TempSrc:      SpO2: 93% 100% 98% 97%  Weight:      Height:        Intake/Output Summary (Last 24 hours) at 12/12/2019 1110 Last data filed at 12/12/2019 0850 Gross per 24 hour  Intake 250 ml  Output 2012 ml  Net -1762 ml   Filed Weights   12/11/19 0600 12/11/19 1345 12/12/19 0623  Weight: 60.2 kg 60.2 kg 60.2 kg    Examination:  General exam: Appears calm and comfortable  Respiratory system: Clear to auscultation. Respiratory effort normal. Cardiovascular system: S1 & S2 heard, RRR.  Gastrointestinal system: Abdomen is nondistended, soft and nontender.  Central nervous system: Alert and oriented. No focal neurological deficits. Extremities: Symmetric 5 x 5 power.  Left upper extremity dressing clean dry and intact. Skin: No rashes, lesions or ulcers Psychiatry: Flat affect.    Data Reviewed: I have personally reviewed following labs and imaging studies  CBC: Recent Labs    Lab 01-04-2020 0335 12/11/19 0319 12/12/19 0400  WBC 4.5 4.7 5.1  NEUTROABS 3.2  --   --   HGB 8.8* 8.3* 9.7*  HCT 28.7* 27.8* 32.2*  MCV 93.8 93.0 94.4  PLT 209 172 947   Basic Metabolic Panel: Recent Labs  Lab 04-Jan-2020 0335 January 04, 2020 0335 01/04/20 0830 12/08/19 0548 12/09/19 1043 12/11/19 0846 12/12/19 0400  NA 137  --   --  140 138 138 136  K 4.4  --   --  3.9 3.9 3.6 3.1*  CL 109  --   --  107 104 98 96*  CO2 17*  --   --  17* 19* 24 28  GLUCOSE 103*  --   --  106* 81 176* 84  BUN 76*  --   --  76* 77* 54* 34*  CREATININE 7.60*  --   --  8.04* 8.83* 6.47* 4.81*  CALCIUM 8.3*   < >  --  8.6* 8.0* 8.4*  8.0* 8.8*  MG  --   --  1.9  --   --   --   --   PHOS  --   --  7.2* 8.6* 8.4* 5.9*  --    < > = values in this interval not displayed.   GFR: Estimated Creatinine Clearance: 10.7 mL/min (A) (by C-G formula based on SCr of 4.81 mg/dL (H)). Liver Function Tests: Recent Labs  Lab 2020-01-04 0335 12/08/19 0548 12/09/19 1043 12/11/19 0846  AST 13*  --   --   --   ALT 13  --   --   --   ALKPHOS 76  --   --   --  BILITOT 0.6  --   --   --   PROT 7.1  --   --   --   ALBUMIN 3.8 3.6 3.5 3.8   No results for input(s): LIPASE, AMYLASE in the last 168 hours. No results for input(s): AMMONIA in the last 168 hours. Coagulation Profile: Recent Labs  Lab 12/09/19 1705  INR 1.1   Cardiac Enzymes: No results for input(s): CKTOTAL, CKMB, CKMBINDEX, TROPONINI in the last 168 hours. BNP (last 3 results) No results for input(s): PROBNP in the last 8760 hours. HbA1C: No results for input(s): HGBA1C in the last 72 hours. CBG: No results for input(s): GLUCAP in the last 168 hours. Lipid Profile: No results for input(s): CHOL, HDL, LDLCALC, TRIG, CHOLHDL, LDLDIRECT in the last 72 hours. Thyroid Function Tests: No results for input(s): TSH, T4TOTAL, FREET4, T3FREE, THYROIDAB in the last 72 hours. Anemia Panel: No results for input(s): VITAMINB12, FOLATE, FERRITIN, TIBC,  IRON, RETICCTPCT in the last 72 hours. Sepsis Labs: No results for input(s): PROCALCITON, LATICACIDVEN in the last 168 hours.  Recent Results (from the past 240 hour(s))  SARS Coronavirus 2 by RT PCR (hospital order, performed in [facility] hospital lab) Nasopharyngeal Nasopharyngeal Swab     Status: None   Collection Time: 12/07/19  6:37 AM   Specimen: Nasopharyngeal Swab  Result Value Ref Range Status   SARS Coronavirus 2 NEGATIVE NEGATIVE Final    Comment: (NOTE) SARS-CoV-2 target nucleic acids are NOT DETECTED.  The SARS-CoV-2 RNA is generally detectable in upper and lower respiratory specimens during the acute phase of infection. The lowest concentration of SARS-CoV-2 viral copies this assay can detect is 250 copies / mL. A negative result does not preclude SARS-CoV-2 infection and should not be used as the sole basis for treatment or other patient management decisions.  A negative result may occur with improper specimen collection / handling, submission of specimen other than nasopharyngeal swab, presence of viral mutation(s) within the areas targeted by this assay, and inadequate number of viral copies (<250 copies / mL). A negative result must be combined with clinical observations, patient history, and epidemiological information.  Fact Sheet for Patients:   StrictlyIdeas.no  Fact Sheet for Healthcare Providers: BankingDealers.co.za  This test is not yet approved or  cleared by the [place] FDA and has been authorized for detection and/or diagnosis of SARS-CoV-2 by FDA under an Emergency Use Authorization (EUA).  This EUA will remain in effect (meaning this test can be used) for the duration of the COVID-19 declaration under Section 564(b)(1) of the Act, 21 U.S.C. section 360bbb-3(b)(1), unless the authorization is terminated or revoked sooner.  Performed at [facility], [address] [place], [place] [address]     MRSA PCR Screening     Status: None   Collection Time: 12/07/19 10:07 AM   Specimen: Nasal Mucosa; Nasopharyngeal  Result Value Ref Range Status   MRSA by PCR NEGATIVE NEGATIVE Final    Comment:        The GeneXpert MRSA Assay (FDA approved for NASAL specimens only), is one component of a comprehensive MRSA colonization surveillance program. It is not intended to diagnose MRSA infection nor to guide or monitor treatment for MRSA infections. Performed at [facility], [address] [place], [place] [address]          Radiology Studies: IR US Guide Vasc Access Right  Result Date: 12/10/2019 INDICATION: 54 year old female with history of end-stage renal disease. Presents for tunneled hemodialysis catheter. EXAM: TUNNELED CENTRAL  VENOUS HEMODIALYSIS CATHETER PLACEMENT WITH ULTRASOUND AND FLUOROSCOPIC GUIDANCE MEDICATIONS: Ancef 2 gm IV . The antibiotic was given in an appropriate time interval prior to skin puncture. ANESTHESIA/SEDATION: Moderate (conscious) sedation was employed during this procedure. A total of Versed 1.5 mg and Fentanyl 50 mcg was administered intravenously. Moderate Sedation Time: 15 minutes. The patient's level of consciousness and vital signs were monitored continuously by radiology nursing throughout the procedure under my direct supervision. FLUOROSCOPY TIME:  0 minutes 54 seconds (2 mGy). COMPLICATIONS: None immediate. PROCEDURE: Informed written consent was obtained from the patient after a discussion of the risks, benefits, and alternatives to treatment. Questions regarding the procedure were encouraged and answered. The right neck and chest were prepped with chlorhexidine in a sterile fashion, and a sterile drape was applied covering the operative field. Maximum barrier sterile technique with sterile gowns and gloves were used for the procedure. A timeout was performed prior to the initiation of the procedure. After creating a small venotomy incision, a 21 gauge  micropuncture kit was utilized to access the internal jugular vein. Real-time ultrasound guidance was utilized for vascular access including the acquisition of a permanent ultrasound image documenting patency of the accessed vessel. A J wire was advanced to the level of the IVC and the micropuncture sheath was exchanged for an 8 Fr dilator. A 14.5 French tunneled hemodialysis catheter measuring 19 cm from tip to cuff was tunneled in a retrograde fashion from the anterior chest wall to the venotomy incision. Serial dilation was then performed an a peel-away sheath was placed. The catheter was then placed through the peel-away sheath with the catheter tip ultimately positioned within the right atrium. Final catheter positioning was confirmed and documented with a spot radiographic image. The catheter aspirates and flushes normally. The catheter was flushed with appropriate volume heparin dwells. The catheter exit site was secured with a 0-Silk retention suture. The venotomy incision was closed with Dermabond. Sterile dressings were applied. The patient tolerated the procedure well without immediate post procedural complication. IMPRESSION: Successful placement of 19 cm tip to cuff tunneled hemodialysis catheter via the right internal jugular vein with catheter tip terminating within the right atrium. The catheter is ready for immediate use. Electronically Signed   By: Ruthann Cancer MD   On: 12/10/2019 13:21   IR TUNNELED CENTRAL VENOUS CATHETER PLACEMENT  Result Date: 12/10/2019 INDICATION: 54 year old female with history of end-stage renal disease. Presents for tunneled hemodialysis catheter. EXAM: TUNNELED CENTRAL VENOUS HEMODIALYSIS CATHETER PLACEMENT WITH ULTRASOUND AND FLUOROSCOPIC GUIDANCE MEDICATIONS: Ancef 2 gm IV . The antibiotic was given in an appropriate time interval prior to skin puncture. ANESTHESIA/SEDATION: Moderate (conscious) sedation was employed during this procedure. A total of Versed 1.5 mg  and Fentanyl 50 mcg was administered intravenously. Moderate Sedation Time: 15 minutes. The patient's level of consciousness and vital signs were monitored continuously by radiology nursing throughout the procedure under my direct supervision. FLUOROSCOPY TIME:  0 minutes 54 seconds (2 mGy). COMPLICATIONS: None immediate. PROCEDURE: Informed written consent was obtained from the patient after a discussion of the risks, benefits, and alternatives to treatment. Questions regarding the procedure were encouraged and answered. The right neck and chest were prepped with chlorhexidine in a sterile fashion, and a sterile drape was applied covering the operative field. Maximum barrier sterile technique with sterile gowns and gloves were used for the procedure. A timeout was performed prior to the initiation of the procedure. After creating a small venotomy incision, a 21 gauge micropuncture kit was utilized  to access the internal jugular vein. Real-time ultrasound guidance was utilized for vascular access including the acquisition of a permanent ultrasound image documenting patency of the accessed vessel. A J wire was advanced to the level of the IVC and the micropuncture sheath was exchanged for an 8 Fr dilator. A 14.5 French tunneled hemodialysis catheter measuring 19 cm from tip to cuff was tunneled in a retrograde fashion from the anterior chest wall to the venotomy incision. Serial dilation was then performed an a peel-away sheath was placed. The catheter was then placed through the peel-away sheath with the catheter tip ultimately positioned within the right atrium. Final catheter positioning was confirmed and documented with a spot radiographic image. The catheter aspirates and flushes normally. The catheter was flushed with appropriate volume heparin dwells. The catheter exit site was secured with a 0-Silk retention suture. The venotomy incision was closed with Dermabond. Sterile dressings were applied. The patient  tolerated the procedure well without immediate post procedural complication. IMPRESSION: Successful placement of 19 cm tip to cuff tunneled hemodialysis catheter via the right internal jugular vein with catheter tip terminating within the right atrium. The catheter is ready for immediate use. Electronically Signed   By: Ruthann Cancer MD   On: 12/10/2019 13:21        Scheduled Meds: . amLODipine  10 mg Oral Daily  . aspirin EC  81 mg Oral Q breakfast  . calcitRIOL  1 mcg Oral Daily  . calcium carbonate  2 tablet Oral BID WC  . carvedilol  12.5 mg Oral BID WC  . Chlorhexidine Gluconate Cloth  6 each Topical Daily  . Chlorhexidine Gluconate Cloth  6 each Topical Q0600  . furosemide  80 mg Oral BID  . heparin  5,000 Units Subcutaneous Q8H  . hydrALAZINE  50 mg Oral Q8H  . hydrOXYzine  25 mg Oral BID  . levETIRAcetam  500 mg Oral Daily  . pantoprazole  40 mg Oral Daily  . sevelamer carbonate  800 mg Oral TID WC  . sodium chloride flush  3 mL Intravenous Q12H  . venlafaxine XR  150 mg Oral Q breakfast   Continuous Infusions: . sodium chloride    . sodium chloride    . sodium chloride       LOS: 5 days    Time spent: 30 minutes    Dhruvi Crenshaw Darleen Crocker, DO [facility]  If 7PM-7AM, please contact night-coverage [email or web address] 12/12/2019, 11:10 AM

## 2019-12-12 NOTE — Op Note (Signed)
    OPERATIVE REPORT  DATE OF SURGERY: 12/12/2019  PATIENT: [name], 54 y.o. female MRN: [id number]  DOB: 05/22/1965  PRE-OPERATIVE DIAGNOSIS: End-stage renal disease  POST-OPERATIVE DIAGNOSIS:  Same  PROCEDURE: Left brachiocephalic AV fistula  SURGEON:  Curt Jews, M.D.  PHYSICIAN ASSISTANT: Armed forces training and education officer  The assistant was needed for exposure and to expedite the case  ANESTHESIA: Local with sedation  EBL: per anesthesia record  Total I/O In: 250 [I.V.:200; IV Piggyback:50] Out: 5 [Blood:5]  BLOOD ADMINISTERED: none  DRAINS: none  SPECIMEN: none  COUNTS CORRECT:  YES  PATIENT DISPOSITION:  PACU - hemodynamically stable  PROCEDURE DETAILS: The patient was taken to the operating room placed supine position where the area of the left arm was prepped and draped in usual sterile fashion.  SonoSite ultrasound was used to visualize the veins.  The patient did have a good caliber cephalic vein which was seen on preoperative vein mapping.  Patient had multiple vena punctures at the level of the antecubital space.  An incision was made using local anesthesia at the antecubital space and carried down to isolate the cephalic vein which was of good caliber and the brachial artery which was also of excellent size with no evidence of atherosclerotic change.  The vein was ligated distally and divided.  [name] were used to pass through the vein up to 3-1/2 mm with no resistance.  The vein was flushed with heparinized saline and gently dilated.  The brachial artery was occluded proximally and distally and was opened with an 11 blade and sent longstanding with Potts scissors.  The vein was cut to the appropriate length and was sewn end-to-side to the artery with a running 6-0 Prolene suture.  Clamps were removed and excellent thrill was noted in the vein.  The patient maintained good biphasic Doppler flow at the wrist which augmented slightly with compression of the fistula.  The  wounds were irrigated with saline.  Hemostasis was obtained electrocautery.  The wounds were closed with 3-0 Vicryl in the subcutaneous and subcuticular tissue.  Sterile dressing was applied and the patient was transferred to the recovery room in stable condition   Rosetta Posner, M.D., [facility] 12/12/2019 9:04 AM

## 2019-12-12 NOTE — Progress Notes (Signed)
Report given to [facility], [place]. Patient transported via wheelchair to 305. Patient and all VSS.

## 2019-12-12 NOTE — Anesthesia Postprocedure Evaluation (Signed)
Anesthesia Post Note  Patient: [name]  Procedure(s) Performed: LEFT ARM ARTERIOVENOUS FISTULA (Left Arm Lower)  Patient location during evaluation: PACU Anesthesia Type: General Level of consciousness: awake, oriented, awake and alert and patient cooperative Pain management: satisfactory to patient Vital Signs Assessment: post-procedure vital signs reviewed and stable Respiratory status: spontaneous breathing, respiratory function stable and nonlabored ventilation Cardiovascular status: stable Postop Assessment: no apparent nausea or vomiting Anesthetic complications: no   No complications documented.   Last Vitals:  Vitals:   12/12/19 0400 12/12/19 0623  BP:  (!) 129/94  Pulse:  95  Resp:  15  Temp: 36.6 C 36.9 C  SpO2:  96%    Last Pain:  Vitals:   12/12/19 0623  TempSrc: Oral  PainSc: 0-No pain                 Thayden Lemire

## 2019-12-12 NOTE — Anesthesia Preprocedure Evaluation (Signed)
Anesthesia Evaluation  Patient identified by MRN, date of birth, ID band Patient awake    Reviewed: Allergy & Precautions, NPO status , Patient's Chart, lab work & pertinent test results  History of Anesthesia Complications Negative for: history of anesthetic complications  Airway Mallampati: III  TM Distance: >3 FB Neck ROM: Full    Dental  (+) Missing, Dental Advisory Given   Pulmonary neg sleep apnea (snoring), former smoker,    Pulmonary exam normal breath sounds clear to auscultation       Cardiovascular Exercise Tolerance: Poor hypertension, Pt. on medications Normal cardiovascular exam Rhythm:Regular Rate:Normal  07-Dec-2019 02:57:14 [facility]-ER ROUTINE RECORD Sinus rhythm Probable left atrial enlargement Consider anterior infarct Abnormal T, consider ischemia, lateral leads Confirmed by Rolland Porter [phone]) on 12/07/2019 3:26:24 AM   Neuro/Psych  Headaches, Seizures -, Well Controlled,  PSYCHIATRIC DISORDERS Anxiety Depression Bipolar Disorder    GI/Hepatic GERD  Medicated and Controlled,Gastric bypass   Endo/Other  negative endocrine ROS  Renal/GU ESRF and DialysisRenal disease     Musculoskeletal  (+) Arthritis ,   Abdominal   Peds  Hematology  (+) anemia ,   Anesthesia Other Findings   Reproductive/Obstetrics negative OB ROS                             Anesthesia Physical Anesthesia Plan  ASA: IV  Anesthesia Plan: General   Post-op Pain Management:    Induction: Intravenous  PONV Risk Score and Plan:   Airway Management Planned: Nasal Cannula, Natural Airway and Simple Face Mask  Additional Equipment:   Intra-op Plan:   Post-operative Plan:   Informed Consent: I have reviewed the patients History and Physical, chart, labs and discussed the procedure including the risks, benefits and alternatives for the proposed anesthesia with the patient or  authorized representative who has indicated his/her understanding and acceptance.    Discussed DNR with patient and Suspend DNR.   Dental advisory given  Plan Discussed with: CRNA and Surgeon  Anesthesia Plan Comments: (Agreed to suspend the DNR during the case.)        Anesthesia Quick Evaluation

## 2019-12-12 NOTE — Interval H&P Note (Signed)
History and Physical Interval Note:  12/12/2019 7:08 AM  [name]  has presented today for surgery, with the diagnosis of END STAGE RENAL DISEASE.  The various methods of treatment have been discussed with the patient and family. After consideration of risks, benefits and other options for treatment, the patient has consented to  Procedure(s): LEFT ARM FISTULA VERSUS GRAFT (Left) as a surgical intervention.  The patient's history has been reviewed, patient examined, no change in status, stable for surgery.  I have reviewed the patient's chart and labs.  Questions were answered to the patient's satisfaction.     Curt Jews

## 2019-12-12 NOTE — Transfer of Care (Signed)
Immediate Anesthesia Transfer of Care Note  Patient: [name]  Procedure(s) Performed: LEFT ARM ARTERIOVENOUS FISTULA (Left Arm Lower)  Patient Location: PACU  Anesthesia Type:General  Level of Consciousness: awake, alert  and oriented  Airway & Oxygen Therapy: Patient Spontanous Breathing  Post-op Assessment: Report given to RN, Post -op Vital signs reviewed and stable and Patient moving all extremities X 4  Post vital signs: Reviewed and stable  Last Vitals:  Vitals Value Taken Time  BP 121/78 12/12/19 0900  Temp    Pulse 105 12/12/19 0858  Resp 15 12/12/19 0900  SpO2 96 % 12/12/19 0858  Vitals shown include unvalidated device data.  Last Pain:  Vitals:   12/12/19 0623  TempSrc: Oral  PainSc: 0-No pain      Patients Stated Pain Goal: 6 (91/66/06 0045)  Complications: No complications documented.

## 2019-12-12 NOTE — Addendum Note (Signed)
Addendum  created 12/12/19 0903 by Jonna Munro, CRNA   Flowsheet accepted, Intraprocedure Flowsheets edited

## 2019-12-13 ENCOUNTER — Encounter (HOSPITAL_COMMUNITY): Payer: Self-pay | Admitting: Vascular Surgery

## 2019-12-13 LAB — BASIC METABOLIC PANEL
Anion gap: 18 — ABNORMAL HIGH (ref 5–15)
BUN: 51 mg/dL — ABNORMAL HIGH (ref 6–20)
CO2: 25 mmol/L (ref 22–32)
Calcium: 9.2 mg/dL (ref 8.9–10.3)
Chloride: 93 mmol/L — ABNORMAL LOW (ref 98–111)
Creatinine, Ser: 6.65 mg/dL — ABNORMAL HIGH (ref 0.44–1.00)
GFR calc Af Amer: 8 mL/min — ABNORMAL LOW (ref >60)
GFR calc non Af Amer: 7 mL/min — ABNORMAL LOW (ref >60)
Glucose, Bld: 86 mg/dL (ref 70–99)
Potassium: 3 mmol/L — ABNORMAL LOW (ref 3.5–5.1)
Sodium: 136 mmol/L (ref 135–145)

## 2019-12-13 LAB — CBC
HCT: 35.4 % — ABNORMAL LOW (ref 36.0–46.0)
Hemoglobin: 10.1 g/dL — ABNORMAL LOW (ref 12.0–15.0)
MCH: 28 pg (ref 26.0–34.0)
MCHC: 28.5 g/dL — ABNORMAL LOW (ref 30.0–36.0)
MCV: 98.1 fL (ref 80.0–100.0)
Platelets: 214 10*3/uL (ref 150–400)
RBC: 3.61 MIL/uL — ABNORMAL LOW (ref 3.87–5.11)
RDW: 16.5 % — ABNORMAL HIGH (ref 11.5–15.5)
WBC: 5.5 10*3/uL (ref 4.0–10.5)
nRBC: 0 % (ref 0.0–0.2)

## 2019-12-13 MED ORDER — AMLODIPINE BESYLATE 10 MG PO TABS: 10.0000 mg | ORAL_TABLET | Freq: Every day | ORAL | 3 refills | Status: DC

## 2019-12-13 MED ORDER — PANTOPRAZOLE SODIUM 40 MG PO TBEC: 40.0000 mg | DELAYED_RELEASE_TABLET | Freq: Every day | ORAL | 2 refills | Status: AC

## 2019-12-13 MED ORDER — SEVELAMER CARBONATE 800 MG PO TABS: 800.0000 mg | ORAL_TABLET | Freq: Three times a day (TID) | ORAL | 2 refills | Status: AC

## 2019-12-13 MED ORDER — FUROSEMIDE 80 MG PO TABS: 80.0000 mg | ORAL_TABLET | Freq: Two times a day (BID) | ORAL | 3 refills | Status: AC

## 2019-12-13 MED ORDER — CARVEDILOL 12.5 MG PO TABS: 12.5000 mg | ORAL_TABLET | Freq: Two times a day (BID) | ORAL | 2 refills | Status: DC

## 2019-12-13 MED ORDER — HYDRALAZINE HCL 50 MG PO TABS: 50.0000 mg | ORAL_TABLET | Freq: Three times a day (TID) | ORAL | 2 refills | Status: DC

## 2019-12-13 MED ORDER — OXYCODONE-ACETAMINOPHEN 5-325 MG PO TABS
1.0000 | ORAL_TABLET | ORAL | 0 refills | Status: AC | PRN
Start: 2019-12-13 — End: ?

## 2019-12-13 NOTE — Progress Notes (Signed)
Nephrology Follow-Up Consult Note [facility]  Assessment/Recommendations: [name] is a/an 54 y.o. female with a past medical history significant for CKD, anemia, seizure disorder, HTN, GERD, depression who present w/ HTN urgency and volume overload along with AKI.  AKI on CKD5, now ESRD, nonoliguric: Longstanding CKD w/ recent BL ~5 but fluctuates. Now ESRD and pt is accepting of starting HD especially in light of uremic symptoms.  HD start 9/21.  [place] placed 9/21 with IR, s/p LUE BC AVF 9/23 with Dr. Donnetta Hutching. Follow up with [facility] to be planned 4-6 weeks out  HD#3 today. Slow start protocol. 4k 2.5cal bath  Outpatient HD plan: [facility] MWF 2nd shift: 12:30pm  Needs daily met panel/RFP if she's still here  Volume Status, improved: changed diuretics to lasix 22m PO BID. If signs of volume overload/resp distress can given lasix 1281miv x 1 dose but overall euvolemic today  Hypertension, improved: hydralazine 5040mid added. Switched labetalol to coreg 12.5mg64md. Continue with norvasc 10mg51mly and diuretics (changed to PO)  Secondary hyperparathyroidism: Phos improved. On calcitriol 1mcg 46mly.  Started sevelamer 800mg T2mn 9/19. Trend phos+cal. pth 229  Hypocalcemia: on supplemental calcium. Check ionized calcium  Anemia due to CKD, improved: Hemoglobin 10.1.  Iron sat 25 and ferritin 49 on 11/22/2019. Dosed feraheme x1 9/19. c/w feraheme and will likely need Aranesp thereafter. Transfuse for hgb <7.  Anion gap metabolic acidosis: improved, stopped nahco3 given improvement on HD  Discussed with primary service. Okay from a nephrology perspective to be discharged after HD today with plan to start outpatient HD this coming Monday.  [facility] 12/13/2019 9:32 AM  ___________________________________________________________  CC: AKI on CKD5  Interval History/Subjective:   No acute events. Did well with surgery yesterday. Had  some oozing around HD catheter site requiring dressing change. Otherwise she's eager to go home   Medications:  Current Facility-Administered Medications  Medication Dose Route Frequency Provider Last Rate Last Admin  . 0.9 %  sodium chloride infusion  250 mL Intravenous PRN Madera,Barton Dubois   . 0.9 %  sodium chloride infusion  100 mL Intravenous PRN Early, Todd F,Arvilla Meres   . 0.9 %  sodium chloride infusion  100 mL Intravenous PRN Early, Todd F,Arvilla Meres   . acetaminophen (TYLENOL) tablet 650 mg  650 mg Oral Q8H PRN Madera,Barton Dubois650 mg at 12/12/19 0446  . alteplase (CATHFLO ACTIVASE) injection 2 mg  2 mg Intracatheter Once PRN Early, Todd F,Arvilla Meres   . amLODipine (NORVASC) tablet 10 mg  10 mg Oral Daily Early, Todd F,Arvilla Meres10 mg at 12/12/19 1039  . aspirin EC tablet 81 mg  81 mg Oral Q breakfast Madera,Barton Dubois81 mg at 12/11/19 0737  . calcitRIOL (ROCALTROL) capsule 1 mcg  1 mcg Oral Daily Madera,Barton Dubois1 mcg at 12/12/19 1039  . calcium carbonate (OS-CAL - dosed in mg of elemental calcium) tablet 1,000 mg of elemental calcium  2 tablet Oral BID WC Madera,Barton Dubois1,000 mg of elemental calcium at 12/12/19 1703  . calcium carbonate (TUMS - dosed in mg elemental calcium) chewable tablet 200 mg of elemental calcium  1 tablet Oral Q12H PRN Madera,Barton Dubois200 mg of elemental calcium at 12/07/19 1424  . carvedilol (COREG) tablet 12.5 mg  12.5 mg  Oral BID WC Early, Arvilla Meres, MD   12.5 mg at 12/12/19 1702  . Chlorhexidine Gluconate Cloth 2 % PADS 6 each  6 each Topical Daily Barton Dubois, MD   6 each at 12/12/19 1051  . Chlorhexidine Gluconate Cloth 2 % PADS 6 each  6 each Topical Q0600 Early, Arvilla Meres, MD   6 each at 12/13/19 0559  . furosemide (LASIX) tablet 80 mg  80 mg Oral BID Rosetta Posner, MD   80 mg at 12/12/19 1703  . heparin injection 3,200 Units  3,200 Units Dialysis PRN Early, Arvilla Meres, MD      . heparin injection 5,000 Units  5,000 Units Subcutaneous Q8H Barton Dubois, MD   5,000 Units at 12/12/19 1512  . hydrALAZINE (APRESOLINE) injection 10 mg  10 mg Intravenous Q6H PRN Barton Dubois, MD   10 mg at 12/09/19 0829  . hydrALAZINE (APRESOLINE) tablet 50 mg  50 mg Oral Q8H Rosetta Posner, MD   50 mg at 12/13/19 0557  . HYDROmorphone (DILAUDID) injection 1 mg  1 mg Intravenous Q4H PRN Barton Dubois, MD   1 mg at 12/13/19 2800  . hydrOXYzine (ATARAX/VISTARIL) tablet 25 mg  25 mg Oral BID Barton Dubois, MD   25 mg at 12/12/19 2015  . levETIRAcetam (KEPPRA XR) 24 hr tablet 500 mg  500 mg Oral Daily Barton Dubois, MD   500 mg at 12/11/19 0959  . oxyCODONE-acetaminophen (PERCOCET/ROXICET) 5-325 MG per tablet 1 tablet  1 tablet Oral Q4H PRN Early, Arvilla Meres, MD      . pantoprazole (PROTONIX) EC tablet 40 mg  40 mg Oral Daily Barton Dubois, MD   40 mg at 12/12/19 1038  . promethazine (PHENERGAN) injection 12.5 mg  12.5 mg Intravenous Q6H PRN Barton Dubois, MD   12.5 mg at 12/09/19 2152  . sevelamer carbonate (RENVELA) tablet 800 mg  800 mg Oral TID WC Barton Dubois, MD   800 mg at 12/12/19 1702  . sodium chloride flush (NS) 0.9 % injection 3 mL  3 mL Intravenous Q12H Barton Dubois, MD   3 mL at 12/12/19 2015  . sodium chloride flush (NS) 0.9 % injection 3 mL  3 mL Intravenous PRN Barton Dubois, MD      . temazepam (RESTORIL) capsule 15 mg  15 mg Oral QHS PRN Barton Dubois, MD   15 mg at 12/12/19 2014  . tiZANidine (ZANAFLEX) tablet 4 mg  4 mg Oral Q12H PRN Barton Dubois, MD      . venlafaxine XR (EFFEXOR-XR) 24 hr capsule 150 mg  150 mg Oral Q breakfast Barton Dubois, MD   150 mg at 12/11/19 3491      Review of Systems: 10 systems reviewed and negative except per interval history/subjective  Physical Exam: Vitals:   12/13/19 0209 12/13/19 0557  BP: 136/89 137/88  Pulse: (!) 107 100  Resp: 18 16  Temp: 98.3 F (36.8 C) 98.6 F (37 C)  SpO2: 100% 100%   No intake/output data recorded.  Intake/Output Summary (Last 24 hours) at 12/13/2019  0932 Last data filed at 12/12/2019 2200 Gross per 24 hour  Intake --  Output 300 ml  Net -300 ml   Gen: NAD, laying in bed, comfortable HEENT: [place]/at, perrla Resp: cta bl, no w/r/r/r/c, unlabored, bl chest expansion CV: s1s2, rrr, no m/r/g Abd: soft, nt/nd Ext: no edema, LUE avf c/d/i Skin: no rash, RIJ tdc dressing place (soaked) Neuro: speech clear and coherent, moves all ext spontaneously  Test Results I personally reviewed new and old clinical labs and radiology tests Lab Results  Component Value Date   NA 136 12/13/2019   K 3.0 (L) 12/13/2019   CL 93 (L) 12/13/2019   CO2 25 12/13/2019   BUN 51 (H) 12/13/2019   CREATININE 6.65 (H) 12/13/2019   GFR 30.27 (L) 02/15/2017   CALCIUM 9.2 12/13/2019   ALBUMIN 3.8 12/11/2019   PHOS 5.9 (H) 12/11/2019

## 2019-12-13 NOTE — Progress Notes (Signed)
Present with [name] for support during her dialysis.

## 2019-12-13 NOTE — Discharge Summary (Signed)
Physician Discharge Summary  [name]:[id number] DOB: [birth year] DOA: 12/07/2019  PCP: Pixie Casino, MD  Admit date: 12/07/2019  Discharge date: 12/13/2019  Admitted From:Home  Disposition:  Home  Recommendations for Outpatient Follow-up:  1. Follow up with PCP in 1-2 weeks 2. Follow-up with Dr. Sherren Mocha early in 4-6 weeks for reevaluation of fistula 3. Please obtain BMP/CBC in one week 4. Continue with dialysis as ordered MWF starting 9/27 5. Continue blood pressure medications and other agents as noted below 6. Pain medication with Percocet prescribed for total 10 tablets and 0 refills  Home Health: Yes with RN for wound evaluation  Equipment/Devices: None  Discharge Condition: Stable  CODE STATUS: DNR  Diet recommendation: Heart Healthy  Brief/Interim Summary: Nicole Rundquist Millneris a 54 y.o.femalewith medical history significant ofwith a past medical history significant for anemia of chronic disease, seizure disorders, hypertension, gastroesophageal flux disease, bipolar disorder/depression and end-stage renal disease (refusing hemodialysis); who presented to the hospital secondary to headaches, nausea and shortness of breath on exertion. Patient reports symptom has been present for the last 3-4 days and worsening. She denies any fever, hematemesis, hematuria, melena, hematochezia, abdominal pain, blurred vision, focal weakness or any other complaints. Reports to be compliant with her medications no recent changes to them. No sick contacts.  ED Course:Patient found in hypertensive crisis, chest x-ray demonstrating vascular congestion and with worsening in her renal function and BUN. Labetalol x5 doses given throughout her ED stay and subsequently due to failure improvement in her blood pressure was started on Cardene drip. CT scan of the head demonstrated no acute intracranial abnormalities.For her vascular congestion 100 mg of Lasix times once given. TRH  consulted to admit patient for further evaluation and management.  9/22:Patient had her first hemodialysis session 9/21 with 1 L removed. She is overall doing well this morning and will be transferred to telemetry with further plans for hemodialysis today. Plans for permanent vascular access 9/23.  9/23: Patient has undergone left upper extremity brachiocephalic fistula placement with plans to undergo further hemodialysis on 9/24.  Blood pressure is much improved.  9/24: Patient has been seen by nephrology and appears to be stable for discharge after hemodialysis today.  She will resume her new hemodialysis schedule starting 9/27 and will remain on medications as otherwise prescribed with no other acute events noted throughout the course of this admission.  Discharge Diagnoses:  Active Problems:   Hypertensive crisis   ESRD (end stage renal disease) ([place])  Principal discharge diagnosis: Hypertensive crisis in setting of end-stage renal disease with new left upper extremity AV fistula and initiation of hemodialysis.  Discharge Instructions  Discharge Instructions    Diet - low sodium heart healthy   Complete by: As directed    If the dressing is still on your incision site when you go home, remove it on the third day after your surgery date. Remove dressing if it begins to fall off, or if it is dirty or damaged before the third day.   Complete by: As directed    Increase activity slowly   Complete by: As directed      Allergies as of 12/13/2019      Reactions   Ambien [zolpidem Tartrate]    "BLACK OUT", SLEEP DRIVING   Geodon [ziprasidone Hcl]    Seizure activity   Lactulose Itching   Ondansetron Nausea And Vomiting   Quetiapine    Other reaction(s): Other (See Comments) Possible Hair Loss   Sulfa Antibiotics Rash  Zolpidem Rash      Medication List    STOP taking these medications   sodium bicarbonate 650 MG tablet   tiZANidine 4 MG tablet Commonly known as:  ZANAFLEX     TAKE these medications   amLODipine 10 MG tablet Commonly known as: NORVASC Take 1 tablet (10 mg total) by mouth daily. Start taking on: December 14, 2019   aspirin EC 81 MG tablet Take 1 tablet (81 mg total) by mouth daily with breakfast.   calcitRIOL 0.5 MCG capsule Commonly known as: ROCALTROL Take 1 mcg by mouth daily.   calcium carbonate 1250 (500 Ca) MG tablet Commonly known as: OS-CAL - dosed in mg of elemental calcium Take 2 tablets (1,000 mg of elemental calcium total) by mouth 2 (two) times daily with a meal.   carvedilol 12.5 MG tablet Commonly known as: COREG Take 1 tablet (12.5 mg total) by mouth 2 (two) times daily with a meal. What changed:   medication strength  how much to take   furosemide 80 MG tablet Commonly known as: LASIX Take 1 tablet (80 mg total) by mouth 2 (two) times daily. What changed: when to take this   hydrALAZINE 50 MG tablet Commonly known as: APRESOLINE Take 1 tablet (50 mg total) by mouth every 8 (eight) hours. What changed:   medication strength  how much to take  when to take this   hydrOXYzine 25 MG tablet Commonly known as: ATARAX/VISTARIL Take 1 tablet (25 mg total) by mouth 2 (two) times daily.   levETIRAcetam 500 MG 24 hr tablet Commonly known as: Keppra XR Take 1 tablet (500 mg total) by mouth daily.   oxyCODONE-acetaminophen 5-325 MG tablet Commonly known as: PERCOCET/ROXICET Take 1 tablet by mouth every 4 (four) hours as needed for moderate pain.   pantoprazole 40 MG tablet Commonly known as: PROTONIX Take 1 tablet (40 mg total) by mouth daily.   promethazine 25 MG tablet Commonly known as: PHENERGAN Take 25 mg by mouth every 6 (six) hours as needed.   Retacrit 20000 UNIT/ML injection Generic drug: epoetin alfa-epbx 20,000 Units every 30 (thirty) days.   sevelamer carbonate 800 MG tablet Commonly known as: RENVELA Take 1 tablet (800 mg total) by mouth 3 (three) times daily with meals.    traMADol 50 MG tablet Commonly known as: ULTRAM TAKE 1 TABLET BY MOUTH EVERY SIX HOURS AS NEEDED. What changed: See the new instructions.   Tylenol 8 Hour 650 MG CR tablet Generic drug: acetaminophen Take 650 mg by mouth every 8 (eight) hours as needed for pain.   venlafaxine XR 150 MG 24 hr capsule Commonly known as: EFFEXOR-XR Take 150 mg by mouth daily with breakfast.            Discharge Care Instructions  (From admission, onward)         Start     Ordered   12/13/19 0000  If the dressing is still on your incision site when you go home, remove it on the third day after your surgery date. Remove dressing if it begins to fall off, or if it is dirty or damaged before the third day.        12/13/19 [place]. Go in 3 day(s).   Why:   MWF  12:15             Allergies  Allergen Reactions  .  Ambien [Zolpidem Tartrate]     "BLACK OUT", SLEEP DRIVING  . Geodon [Ziprasidone Hcl]     Seizure activity  . Lactulose Itching  . Ondansetron Nausea And Vomiting  . Quetiapine     Other reaction(s): Other (See Comments) Possible Hair Loss  . Sulfa Antibiotics Rash  . Zolpidem Rash    Consultations:  Nephrology  Vascular surgery  Palliative care   Procedures/Studies: DG Chest 2 View  Result Date: 12/07/2019 CLINICAL DATA:  End stage renal disease with shortness of breath. EXAM: CHEST - 2 VIEW COMPARISON:  07/20/2011 FINDINGS: Mild cardiac enlargement. No pleural effusion. Pulmonary vascular congestion and mild interstitial edema noted. No airspace opacities. Bilateral glenohumeral joint osteoarthritis. IMPRESSION: 1. Pulmonary vascular congestion with mild interstitial edema noted. Electronically Signed   By: Kerby Moors M.D.   On: 12/07/2019 04:17   CT Head Wo Contrast  Result Date: 12/07/2019 CLINICAL DATA:  Worsening headache EXAM: CT HEAD WITHOUT CONTRAST TECHNIQUE: Contiguous  axial images were obtained from the base of the skull through the vertex without intravenous contrast. COMPARISON:  07/19/2019 FINDINGS: Brain: There is no mass, hemorrhage or extra-axial collection. The size and configuration of the ventricles and extra-axial CSF spaces are normal. There is hypoattenuation of the white matter, most commonly indicating chronic small vessel disease. Vascular: No abnormal hyperdensity of the major intracranial arteries or dural venous sinuses. No intracranial atherosclerosis. Skull: The visualized skull base, calvarium and extracranial soft tissues are normal. Sinuses/Orbits: No fluid levels or advanced mucosal thickening of the visualized paranasal sinuses. No mastoid or middle ear effusion. The orbits are normal. IMPRESSION: Chronic small vessel disease, advanced for age, without acute intracranial abnormality. Electronically Signed   By: Ulyses Jarred M.D.   On: 12/07/2019 04:27   US RENAL  Result Date: 12/08/2019 CLINICAL DATA:  Acute renal insufficiency EXAM: RENAL / URINARY TRACT ULTRASOUND COMPLETE COMPARISON:  None. FINDINGS: Right Kidney: Renal measurements: 8.5 x 3.9 x 4.5 cm = volume: 79 mL. Contains a 1.6 cm cyst. Increased echogenicity. No hydronephrosis. Left Kidney: Renal measurements: 8.6 x 4.7 x 4.5 cm = volume: 95 mL. Contains a 1.4 cm cyst. Increased echogenicity. Bladder: Appears normal for degree of bladder distention. Other: None. IMPRESSION: 1. Bilateral renal cysts. 2. Increased echogenicity in both kidneys consistent with medical renal disease. Electronically Signed   By: Dorise Bullion III M.D   On: 12/08/2019 17:10   [place] UE VEIN MAPPING LEFT (PRE-OP AVF)  Result Date: 12/09/2019 CLINICAL DATA:  54 year old female, bilateral upper extremity vein mapping EXAM: [place] EXTREM UP VEIN MAPPING COMPARISON:  None. FINDINGS: RIGHT ARTERIES Wrist Radial Artery: Size 3.18m Waveform Triphasic Wrist Ulnar Artery: Size 2.660mWaveform Normal Prox. Forearm Radial  Artery: Size 2.52m18maveform Normal Upper Arm Brachial Artery: High bifurcation of the brachial artery is noted. Size 4.1mm73madial), 3.8 mm (ulnar) waveform Triphasic RIGHT VEINS Forearm Cephalic Vein: Prox 2.21m0.2HEtal 4.4mm 24mth 3.2mm5.2DPr Arm Cephalic Vein: Prox 4.21mm8.2UMal 4.21mm D521mh 2.8mm 3.5TI Arm Basilic Vein: Prox 4.8mm 1.4ERl 5.21mm De352m 7.8mm Upp421mArm Brachial Vein: Prox 2.2mm Dist52m2.2mm Depth23m.6mm ADDITI23mL RIGHT VEINS Axillary Vein:  7.8mm Subclav821m Vein: Patient: Yes Respiratory Phasicity: Present Internal Jugular Vein: Patent: Yes    Respiratory Phasicity: Present Branches > 2 mm: None LEFT ARTERIES Wrist Radial Artery: Size 3.0mm Waveform64miphasic Wrist Ulnar Artery: Size 1.2mm Waveform 24mmal Prox. Forearm Radial Artery: Size 3.1mm Waveform T58mhasic Upper Arm Brachial Artery: Size 4.21mm Waveform Tr34masic LEFT VEINS Forearm Cephalic Vein: Prox 2.2mm Distal 4.11.5QM9m  Depth 8.5YI Upper Arm Cephalic Vein: Prox 5.0YD Distal 4.65m Depth 27.4JOUpper Arm Basilic Vein: Prox 48.7OMDistal 5.443mDepth 7.44m1mpper Arm Brachial Vein: Prox 3.9mm27mstal 5.44mm 48mth 11.3mm A57mTIONAL LEFT VEINS Axillary Vein:  12.4mm Su26mavian Vein: Patient: Yes Respiratory Phasicity: Present Internal Jugular Vein: Patent: Yes    Respiratory Phasicity: Present Branches > 2 mm: None IMPRESSION: 1. High bifurcation of the right brachial artery is noted. 2. Patent bilateral upper extremity arteries and veins as described. Electronically Signed   By: Dylan  Ruthann Cancern: 12/09/2019 16:22   IR US GuidKoreaVasc Access Right  Result Date: 12/10/2019 INDICATION: 54 year53ld female with history of end-stage renal disease. Presents for tunneled hemodialysis catheter. EXAM: TUNNELED CENTRAL VENOUS HEMODIALYSIS CATHETER PLACEMENT WITH ULTRASOUND AND FLUOROSCOPIC GUIDANCE MEDICATIONS: Ancef 2 gm IV . The antibiotic was given in an appropriate time interval prior to skin puncture. ANESTHESIA/SEDATION: Moderate (conscious) sedation  was employed during this procedure. A total of Versed 1.5 mg and Fentanyl 50 mcg was administered intravenously. Moderate Sedation Time: 15 minutes. The patient's level of consciousness and vital signs were monitored continuously by radiology nursing throughout the procedure under my direct supervision. FLUOROSCOPY TIME:  0 minutes 54 seconds (2 mGy). COMPLICATIONS: None immediate. PROCEDURE: Informed written consent was obtained from the patient after a discussion of the risks, benefits, and alternatives to treatment. Questions regarding the procedure were encouraged and answered. The right neck and chest were prepped with chlorhexidine in a sterile fashion, and a sterile drape was applied covering the operative field. Maximum barrier sterile technique with sterile gowns and gloves were used for the procedure. A timeout was performed prior to the initiation of the procedure. After creating a small venotomy incision, a 21 gauge micropuncture kit was utilized to access the internal jugular vein. Real-time ultrasound guidance was utilized for vascular access including the acquisition of a permanent ultrasound image documenting patency of the accessed vessel. A J wire was advanced to the level of the IVC and the micropuncture sheath was exchanged for an 8 Fr dilator. A 14.5 French tunneled hemodialysis catheter measuring 19 cm from tip to cuff was tunneled in a retrograde fashion from the anterior chest wall to the venotomy incision. Serial dilation was then performed an a peel-away sheath was placed. The catheter was then placed through the peel-away sheath with the catheter tip ultimately positioned within the right atrium. Final catheter positioning was confirmed and documented with a spot radiographic image. The catheter aspirates and flushes normally. The catheter was flushed with appropriate volume heparin dwells. The catheter exit site was secured with a 0-Silk retention suture. The venotomy incision was closed  with Dermabond. Sterile dressings were applied. The patient tolerated the procedure well without immediate post procedural complication. IMPRESSION: Successful placement of 19 cm tip to cuff tunneled hemodialysis catheter via the right internal jugular vein with catheter tip terminating within the right atrium. The catheter is ready for immediate use. Electronically Signed   By: Dylan  Ruthann Cancern: 12/10/2019 13:21   US UE VKoreaN MAPPING RIGHT (PRE-OP AVF)  Result Date: 12/10/2019 CLINICAL DATA:  54 year18ld female, bilateral upper extremity vein mapping EXAM: US EXTR[place] UP VEIN MAPPING COMPARISON:  None. FINDINGS: RIGHT ARTERIES Wrist Radial Artery: Size 3.44mm Wav544mrm Triphasic Wrist Ulnar Artery: Size 2.6mm Wave7mm Normal Prox. Forearm Radial Artery: Size 2.7mm Wavef15m Normal Upper Arm Brachial Artery: High bifurcation of the brachial artery is noted. Size 4.1mm (radia65m 3.8 mm (ulnar) waveform Triphasic RIGHT VEINS Forearm  Cephalic Vein: Prox 0.9OB Distal 4.58m Depth 30.9GGUpper Arm Cephalic Vein: Prox 48.3MODistal 4.533mDepth 2.2.9UTpper Arm Basilic Vein: Prox 4.6.5YYistal 5.65m16mepth 7.8mm865mper Arm Brachial Vein: Prox 2.2mm 69mtal 2.2mm D44mh 15.6mm AD51mIONAL RIGHT VEINS Axillary Vein:  7.8mm Sub8mvian Vein: Patient: Yes Respiratory Phasicity: Present Internal Jugular Vein: Patent: Yes    Respiratory Phasicity: Present Branches > 2 mm: None LEFT ARTERIES Wrist Radial Artery: Size 3.67mm Wave16mm Triphasic Wrist Ulnar Artery: Size 1.2mm Wavef365m Normal Prox. Forearm Radial Artery: Size 3.1mm Wavefo26mTriphasic Upper Arm Brachial Artery: Size 4.65mm Wavefor64mriphasic LEFT VEINS Forearm Cephalic Vein: Prox 2.2mm Distal5.0PTm Depth 3.73m Upper A4.6FKphalic Vein: Prox 3.9mm Distal 8.1EX Depth 2.278mUpper Ar5.1ZGilic Vein: Prox 4.9mm Distal 50.1VCDepth 7.67m567mpper Arm B39mhial Vein: Prox 3.9mm Distal 5.67mm57mpth 11.3mm 667mITIONAL LEF567mEINS Axillary Vein:  12.4mm Subclavian Vein23matient: Yes Respiratory  Phasicity: Present Internal Jugular Vein: Patent: Yes    Respiratory Phasicity: Present Branches > 2 mm: None IMPRESSION: 1. High bifurcation of the right brachial artery is noted. 2. Patent bilateral upper extremity arteries and veins as described. Electronically Signed   By: Dylan  Suttle MD   ORuthann Cancer 16:22   IR TUNNELED CENTRAL VENOUS CATHETER PLACEMENT  Result Date: 12/10/2019 INDICATION: 54 year old female w59h history of end-stage renal disease. Presents for tunneled hemodialysis catheter. EXAM: TUNNELED CENTRAL VENOUS HEMODIALYSIS CATHETER PLACEMENT WITH ULTRASOUND AND FLUOROSCOPIC GUIDANCE MEDICATIONS: Ancef 2 gm IV . The antibiotic was given in an appropriate time interval prior to skin puncture. ANESTHESIA/SEDATION: Moderate (conscious) sedation was employed during this procedure. A total of Versed 1.5 mg and Fentanyl 50 mcg was administered intravenously. Moderate Sedation Time: 15 minutes. The patient's level of consciousness and vital signs were monitored continuously by radiology nursing throughout the procedure under my direct supervision. FLUOROSCOPY TIME:  0 minutes 54 seconds (2 mGy). COMPLICATIONS: None immediate. PROCEDURE: Informed written consent was obtained from the patient after a discussion of the risks, benefits, and alternatives to treatment. Questions regarding the procedure were encouraged and answered. The right neck and chest were prepped with chlorhexidine in a sterile fashion, and a sterile drape was applied covering the operative field. Maximum barrier sterile technique with sterile gowns and gloves were used for the procedure. A timeout was performed prior to the initiation of the procedure. After creating a small venotomy incision, a 21 gauge micropuncture kit was utilized to access the internal jugular vein. Real-time ultrasound guidance was utilized for vascular access including the acquisition of a permanent ultrasound image documenting patency of the accessed  vessel. A J wire was advanced to the level of the IVC and the micropuncture sheath was exchanged for an 8 Fr dilator. A 14.5 French tunneled hemodialysis catheter measuring 19 cm from tip to cuff was tunneled in a retrograde fashion from the anterior chest wall to the venotomy incision. Serial dilation was then performed an a peel-away sheath was placed. The catheter was then placed through the peel-away sheath with the catheter tip ultimately positioned within the right atrium. Final catheter positioning was confirmed and documented with a spot radiographic image. The catheter aspirates and flushes normally. The catheter was flushed with appropriate volume heparin dwells. The catheter exit site was secured with a 0-Silk retention suture. The venotomy incision was closed with Dermabond. Sterile dressings were applied. The patient tolerated the procedure well without immediate post procedural complication. IMPRESSION: Successful placement of 19 cm tip to cuff tunneled hemodialysis catheter via the right internal jugular vein  with catheter tip terminating within the right atrium. The catheter is ready for immediate use. Electronically Signed   By: Ruthann Cancer MD   On: 12/10/2019 13:21     Discharge Exam: Vitals:   12/13/19 1000 12/13/19 1030  BP: (!) 126/96 (!) 129/91  Pulse: (!) 116 (!) 109  Resp:    Temp:    SpO2:     Vitals:   12/13/19 0600 12/13/19 0945 12/13/19 1000 12/13/19 1030  BP:  131/76 (!) 126/96 (!) 129/91  Pulse:  (!) 111 (!) 116 (!) 109  Resp:  18    Temp:  98.4 F (36.9 C)    TempSrc:  Oral    SpO2:  99%    Weight: 59.2 kg 59.3 kg    Height:        General: Pt is alert, awake, not in acute distress Cardiovascular: RRR, S1/S2 +, no rubs, no gallops Respiratory: CTA bilaterally, no wheezing, no rhonchi Abdominal: Soft, NT, ND, bowel sounds + Extremities: no edema, no cyanosis    The results of significant diagnostics from this hospitalization (including imaging,  microbiology, ancillary and laboratory) are listed below for reference.     Microbiology: Recent Results (from the past 240 hour(s))  SARS Coronavirus 2 by RT PCR (hospital order, performed in [facility] hospital lab) Nasopharyngeal Nasopharyngeal Swab     Status: None   Collection Time: 12/07/19  6:37 AM   Specimen: Nasopharyngeal Swab  Result Value Ref Range Status   SARS Coronavirus 2 NEGATIVE NEGATIVE Final    Comment: (NOTE) SARS-CoV-2 target nucleic acids are NOT DETECTED.  The SARS-CoV-2 RNA is generally detectable in upper and lower respiratory specimens during the acute phase of infection. The lowest concentration of SARS-CoV-2 viral copies this assay can detect is 250 copies / mL. A negative result does not preclude SARS-CoV-2 infection and should not be used as the sole basis for treatment or other patient management decisions.  A negative result may occur with improper specimen collection / handling, submission of specimen other than nasopharyngeal swab, presence of viral mutation(s) within the areas targeted by this assay, and inadequate number of viral copies (<250 copies / mL). A negative result must be combined with clinical observations, patient history, and epidemiological information.  Fact Sheet for Patients:   StrictlyIdeas.no  Fact Sheet for Healthcare Providers: BankingDealers.co.za  This test is not yet approved or  cleared by the [place] FDA and has been authorized for detection and/or diagnosis of SARS-CoV-2 by FDA under an Emergency Use Authorization (EUA).  This EUA will remain in effect (meaning this test can be used) for the duration of the COVID-19 declaration under Section 564(b)(1) of the Act, 21 U.S.C. section 360bbb-3(b)(1), unless the authorization is terminated or revoked sooner.  Performed at [facility], [address], [place], [place] [address]   MRSA PCR Screening     Status: None    Collection Time: 12/07/19 10:07 AM   Specimen: Nasal Mucosa; Nasopharyngeal  Result Value Ref Range Status   MRSA by PCR NEGATIVE NEGATIVE Final    Comment:        The GeneXpert MRSA Assay (FDA approved for NASAL specimens only), is one component of a comprehensive MRSA colonization surveillance program. It is not intended to diagnose MRSA infection nor to guide or monitor treatment for MRSA infections. Performed at [facility], [address], [place], [place] [address]      Labs: BNP (last 3 results) Recent Labs    12/07/19 0335  BNP 0,355.9*   Basic Metabolic Panel: Recent Labs  Lab 12/07/19 0335 12/07/19 0830 12/08/19 0548 12/08/19 0548 12/09/19 1043 12/11/19 0846 12/12/19 0400 12/13/19 0608  NA   < >  --  140  --  138 138 136 136  K   < >  --  3.9  --  3.9 3.6 3.1* 3.0*  CL   < >  --  107  --  104 98 96* 93*  CO2   < >  --  17*  --  19* 24 28 25   GLUCOSE   < >  --  106*  --  81 176* 84 86  BUN   < >  --  76*  --  77* 54* 34* 51*  CREATININE   < >  --  8.04*  --  8.83* 6.47* 4.81* 6.65*  CALCIUM   < >  --  8.6*   < > 8.0* 8.4*  8.0* 8.8* 9.2  MG  --  1.9  --   --   --   --   --   --   PHOS  --  7.2* 8.6*  --  8.4* 5.9*  --   --    < > = values in this interval not displayed.   Liver Function Tests: Recent Labs  Lab 12/07/19 0335 12/08/19 0548 12/09/19 1043 12/11/19 0846  AST 13*  --   --   --   ALT 13  --   --   --   ALKPHOS 76  --   --   --   BILITOT 0.6  --   --   --   PROT 7.1  --   --   --   ALBUMIN 3.8 3.6 3.5 3.8   No results for input(s): LIPASE, AMYLASE in the last 168 hours. No results for input(s): AMMONIA in the last 168 hours. CBC: Recent Labs  Lab 12/07/19 0335 12/11/19 0319 12/12/19 0400 12/13/19 0608  WBC 4.5 4.7 5.1 5.5  NEUTROABS 3.2  --   --   --   HGB 8.8* 8.3* 9.7* 10.1*  HCT 28.7* 27.8* 32.2* 35.4*  MCV 93.8 93.0 94.4 98.1  PLT 209 172 191 214   Cardiac Enzymes: No results for input(s): CKTOTAL, CKMB,  CKMBINDEX, TROPONINI in the last 168 hours. BNP: Invalid input(s): POCBNP CBG: No results for input(s): GLUCAP in the last 168 hours. D-Dimer No results for input(s): DDIMER in the last 72 hours. Hgb A1c No results for input(s): HGBA1C in the last 72 hours. Lipid Profile No results for input(s): CHOL, HDL, LDLCALC, TRIG, CHOLHDL, LDLDIRECT in the last 72 hours. Thyroid function studies No results for input(s): TSH, T4TOTAL, T3FREE, THYROIDAB in the last 72 hours.  Invalid input(s): FREET3 Anemia work up No results for input(s): VITAMINB12, FOLATE, FERRITIN, TIBC, IRON, RETICCTPCT in the last 72 hours. Urinalysis    Component Value Date/Time   COLORURINE YELLOW 07/20/2019 1458   APPEARANCEUR HAZY (A) 07/20/2019 1458   LABSPEC 1.008 07/20/2019 1458   PHURINE 5.0 07/20/2019 1458   GLUCOSEU NEGATIVE 07/20/2019 1458   HGBUR SMALL (A) 07/20/2019 1458   BILIRUBINUR NEGATIVE 07/20/2019 1458   BILIRUBINUR neg 12/01/2017 1030   KETONESUR NEGATIVE 07/20/2019 1458   PROTEINUR 100 (A) 07/20/2019 1458   UROBILINOGEN 0.2 12/01/2017 1030   UROBILINOGEN 0.2 07/03/2014 1130   NITRITE NEGATIVE 07/20/2019 1458   LEUKOCYTESUR TRACE (A) 07/20/2019 1458   Sepsis Labs Invalid input(s): PROCALCITONIN,  WBC,  LACTICIDVEN Microbiology Recent Results (  from the past 240 hour(s))  SARS Coronavirus 2 by RT PCR (hospital order, performed in [facility] hospital lab) Nasopharyngeal Nasopharyngeal Swab     Status: None   Collection Time: 12/07/19  6:37 AM   Specimen: Nasopharyngeal Swab  Result Value Ref Range Status   SARS Coronavirus 2 NEGATIVE NEGATIVE Final    Comment: (NOTE) SARS-CoV-2 target nucleic acids are NOT DETECTED.  The SARS-CoV-2 RNA is generally detectable in upper and lower respiratory specimens during the acute phase of infection. The lowest concentration of SARS-CoV-2 viral copies this assay can detect is 250 copies / mL. A negative result does not preclude SARS-CoV-2  infection and should not be used as the sole basis for treatment or other patient management decisions.  A negative result may occur with improper specimen collection / handling, submission of specimen other than nasopharyngeal swab, presence of viral mutation(s) within the areas targeted by this assay, and inadequate number of viral copies (<250 copies / mL). A negative result must be combined with clinical observations, patient history, and epidemiological information.  Fact Sheet for Patients:   StrictlyIdeas.no  Fact Sheet for Healthcare Providers: BankingDealers.co.za  This test is not yet approved or  cleared by the [place] FDA and has been authorized for detection and/or diagnosis of SARS-CoV-2 by FDA under an Emergency Use Authorization (EUA).  This EUA will remain in effect (meaning this test can be used) for the duration of the COVID-19 declaration under Section 564(b)(1) of the Act, 21 U.S.C. section 360bbb-3(b)(1), unless the authorization is terminated or revoked sooner.  Performed at [facility], [address], [place], [place] [address]   MRSA PCR Screening     Status: None   Collection Time: 12/07/19 10:07 AM   Specimen: Nasal Mucosa; Nasopharyngeal  Result Value Ref Range Status   MRSA by PCR NEGATIVE NEGATIVE Final    Comment:        The GeneXpert MRSA Assay (FDA approved for NASAL specimens only), is one component of a comprehensive MRSA colonization surveillance program. It is not intended to diagnose MRSA infection nor to guide or monitor treatment for MRSA infections. Performed at [facility], [address] [place], [place] [address]      Time coordinating discharge: 35 minutes  SIGNED:   Rodena Goldmann, DO [facility] 12/13/2019, 10:55 AM  If 7PM-7AM, please contact night-coverage [email or web address]

## 2019-12-13 NOTE — Procedures (Addendum)
     HEMODIALYSIS TREATMENT NOTE:  3.5 hour heparin-free treatment completed via RIJ TDC.  Exit site still having some oozing per pt report.  Dressing changed pre-treatment and remained C/D/I throughout session.  Goal met: 2.5 liters removed without interruption in UF.  All blood was returned.  Rockwell Alexandria, RN    ADDENDUM:  Pt reported feeling dizzy post-treatment while packing belongings and dressing.  BP 95/71, pulse 113.  Two 200cc NS boluses were given and pt was monitored for 30 minutes until she reported feeling fine while moving around.  States she has to climb 16 steps when she gets home.  Will have assisstance.  Net UF (modified):  2.1 liters Post weight 57.1kg

## 2019-12-13 NOTE — Care Management Important Message (Signed)
Important Message  Patient Details  Name: [name] MRN: [id number] Date of Birth: 1965-11-20   Medicare Important Message Given:  Yes     Tommy Medal 12/13/2019, 3:22 PM

## 2019-12-18 ENCOUNTER — Encounter: Payer: Self-pay | Admitting: Orthopaedic Surgery

## 2019-12-18 ENCOUNTER — Other Ambulatory Visit: Payer: Self-pay

## 2019-12-18 ENCOUNTER — Ambulatory Visit (INDEPENDENT_AMBULATORY_CARE_PROVIDER_SITE_OTHER): Payer: Medicare Other | Admitting: Orthopaedic Surgery

## 2019-12-18 VITALS — BP 112/93 | HR 118 | Wt 130.0 lb

## 2019-12-18 DIAGNOSIS — M25512 Pain in left shoulder: Secondary | ICD-10-CM

## 2019-12-18 DIAGNOSIS — M25561 Pain in right knee: Secondary | ICD-10-CM

## 2019-12-18 DIAGNOSIS — G8929 Other chronic pain: Secondary | ICD-10-CM | POA: Diagnosis not present

## 2019-12-18 DIAGNOSIS — N186 End stage renal disease: Secondary | ICD-10-CM | POA: Diagnosis not present

## 2019-12-18 MED ORDER — TRAMADOL HCL 50 MG PO TABS
ORAL_TABLET | ORAL | 0 refills | Status: DC
Start: 2019-12-18 — End: 2020-01-20

## 2019-12-18 NOTE — Progress Notes (Signed)
Patient [name], female DOB:May 21, 1965, 54 y.o. KNL:[id number]  Chief Complaint  Patient presents with  . Knee Pain  . Medication Refill    Tramadol, Tizanidine    HPI  [name] is a 54 y.o. female who has chronic shoulder pain, the right being more painful recently. She has developed some pain in the left hand and thumb. She had carpal tunnel release on the right many years ago and did not have the left hand done. She has been in the hospital recently for new fistula surgery for kidney dialysis.  She had hospitalization in May also.  She goes for dialysis three times a week.  She is a candidate for transplant.  She has no new trauma.   There is no height or weight on file to calculate BMI.  ROS  Review of Systems  HENT: Negative for congestion.   Respiratory: Negative for cough and shortness of breath.   Cardiovascular: Negative for chest pain and leg swelling.  Endocrine: Positive for cold intolerance.  Genitourinary: Positive for difficulty urinating.       On dialysis  Musculoskeletal: Positive for arthralgias.  Allergic/Immunologic: Positive for environmental allergies.  All other systems reviewed and are negative.   All other systems reviewed and are negative.  The following is a summary of the past history medically, past history surgically, known current medicines, social history and family history.  This information is gathered electronically by the computer from prior information and documentation.  I review this each visit and have found including this information at this point in the chart is beneficial and informative.    Past Medical History:  Diagnosis Date  . Anemia   . Anxiety   . Bipolar disorder ([place])   . Depression   . GERD (gastroesophageal reflux disease)   . History of blood transfusion   . Hypertension   . IUD    HISTORY OF IUD --REMOVED IN 2006  . Seizures ([place]) [id number]   two seizures due to a med changes    Past  Surgical History:  Procedure Laterality Date  . AV FISTULA PLACEMENT Left 12/12/2019   Procedure: LEFT ARM ARTERIOVENOUS FISTULA;  Surgeon: Rosetta Posner, MD;  Location: [facility];  Service: Vascular;  Laterality: Left;  . CARPAL TUNNEL RELEASE Right 07/08/2014   Procedure: RIGHT CARPAL TUNNEL RELEASE;  Surgeon: Sanjuana Kava, MD;  Location: [facility];  Service: Orthopedics;  Laterality: Right;  . CHOLECYSTECTOMY    . GASTRIC BYPASS  2000  . HYSTEROSCOPY WITH D & C  11/14/2011   Procedure: DILATATION AND CURETTAGE /HYSTEROSCOPY;  Surgeon: Marylynn Pearson, MD;  Location: [facility];  Service: Gynecology;;  with removal of polyps  . INTRAUTERINE DEVICE (IUD) INSERTION  03/21/2010  . IR PERC TUN PERIT CATH WO PORT S&I Dartha Lodge  12/10/2019  . IR US GUIDE VASC ACCESS RIGHT  12/10/2019  . LAPAROSCOPIC GASTROTOMY W/ REPAIR OF ULCER    . PANNICULECTOMY    . ROTATOR CUFF REPAIR      Family History  Problem Relation Age of Onset  . Hypertension Mother   . Kidney failure Mother   . Hypertension Father   . Diabetes Cousin   . Diabetes Sister   . Hypertension Sister   . Kidney disease Sister        dialysis  . Hypertension Brother   . Heart attack Brother   . Hypertension Maternal Aunt   . Hypertension Maternal Uncle   . Hypertension Maternal Grandmother   .  Hypertension Maternal Grandfather   . Hypertension Paternal Grandmother   . Hypertension Paternal Grandfather   . Heart attack Brother   . Hypertension Brother   . Hypertension Sister     Social History Social History   Tobacco Use  . Smoking status: Former Smoker    Packs/day: 0.25    Years: 4.00    Pack years: 1.00    Types: Cigarettes    Quit date: 03/21/1988    Years since quitting: 31.7  . Smokeless tobacco: Never Used  Vaping Use  . Vaping Use: Never used  Substance Use Topics  . Alcohol use: Yes    Comment: 1-2 times a year.   . Drug use: No    Allergies  Allergen Reactions  . Ambien [Zolpidem Tartrate]     "BLACK OUT", SLEEP  DRIVING  . Geodon [Ziprasidone Hcl]     Seizure activity  . Lactulose Itching  . Ondansetron Nausea And Vomiting  . Quetiapine     Other reaction(s): Other (See Comments) Possible Hair Loss  . Sulfa Antibiotics Rash  . Zolpidem Rash    Current Outpatient Medications  Medication Sig Dispense Refill  . acetaminophen (TYLENOL 8 HOUR) 650 MG CR tablet Take 650 mg by mouth every 8 (eight) hours as needed for pain.    Marland Kitchen amLODipine (NORVASC) 10 MG tablet Take 1 tablet (10 mg total) by mouth daily. (Patient not taking: Reported on 12/18/2019) 30 tablet 3  . aspirin EC 81 MG tablet Take 1 tablet (81 mg total) by mouth daily with breakfast. 30 tablet 2  . calcitRIOL (ROCALTROL) 0.5 MCG capsule Take 1 mcg by mouth daily.    . calcium carbonate (OS-CAL - DOSED IN MG OF ELEMENTAL CALCIUM) 1250 (500 Ca) MG tablet Take 2 tablets (1,000 mg of elemental calcium total) by mouth 2 (two) times daily with a meal. 120 tablet 3  . carvedilol (COREG) 12.5 MG tablet Take 1 tablet (12.5 mg total) by mouth 2 (two) times daily with a meal. 60 tablet 2  . epoetin alfa-epbx (RETACRIT) 02637 UNIT/ML injection 20,000 Units every 30 (thirty) days.    . furosemide (LASIX) 80 MG tablet Take 1 tablet (80 mg total) by mouth 2 (two) times daily. 60 tablet 3  . hydrALAZINE (APRESOLINE) 50 MG tablet Take 1 tablet (50 mg total) by mouth every 8 (eight) hours. 90 tablet 2  . hydrOXYzine (ATARAX/VISTARIL) 25 MG tablet Take 1 tablet (25 mg total) by mouth 2 (two) times daily. 60 tablet 2  . levETIRAcetam (KEPPRA XR) 500 MG 24 hr tablet Take 1 tablet (500 mg total) by mouth daily. 90 tablet 3  . oxyCODONE-acetaminophen (PERCOCET/ROXICET) 5-325 MG tablet Take 1 tablet by mouth every 4 (four) hours as needed for moderate pain. 10 tablet 0  . pantoprazole (PROTONIX) 40 MG tablet Take 1 tablet (40 mg total) by mouth daily. 30 tablet 2  . promethazine (PHENERGAN) 25 MG tablet Take 25 mg by mouth every 6 (six) hours as needed.    .  sevelamer carbonate (RENVELA) 800 MG tablet Take 1 tablet (800 mg total) by mouth 3 (three) times daily with meals. 90 tablet 2  . traMADol (ULTRAM) 50 MG tablet TAKE 1 TABLET BY MOUTH EVERY SIX HOURS AS NEEDED. (Patient taking differently: Take 50 mg by mouth every 6 (six) hours as needed for moderate pain. ) 90 tablet 0  . venlafaxine XR (EFFEXOR-XR) 150 MG 24 hr capsule Take 150 mg by mouth daily with breakfast.  No current facility-administered medications for this visit.     Physical Exam  There were no vitals taken for this visit.  Constitutional: overall normal hygiene, normal nutrition, well developed, normal grooming, normal body habitus. Assistive device:none  Musculoskeletal: gait and station Limp none, muscle tone and strength are normal, no tremors or atrophy is present.  .  Neurological: coordination overall normal.  Deep tendon reflex/nerve stretch intact.  Sensation normal.  Cranial nerves II-XII intact.   Skin:   Normal overall no scars, lesions, ulcers or rashes. No psoriasis.  Psychiatric: Alert and oriented x 3.  Recent memory intact, remote memory unclear.  Normal mood and affect. Well groomed.  Good eye contact.  Cardiovascular: overall no swelling, no varicosities, no edema bilaterally, normal temperatures of the legs and arms, no clubbing, cyanosis and good capillary refill.  Lymphatic: palpation is normal.  Right shoulder has tenderness but good ROM, pain in the extremes.  All other systems reviewed and are negative   The patient has been educated about the nature of the problem(s) and counseled on treatment options.  The patient appeared to understand what I have discussed and is in agreement with it.  Encounter Diagnoses  Name Primary?  . Chronic pain of right knee Yes  . Chronic left shoulder pain   . End stage kidney disease [facility])     PLAN Call if any problems.  Precautions discussed.  Continue current medications.   Return to clinic 3  months   I have reviewed the [place] web site prior to prescribing narcotic medicine for this patient.   Electronically Signed Sanjuana Kava, MD 9/29/202110:08 AM

## 2019-12-19 ENCOUNTER — Telehealth: Payer: Self-pay | Admitting: Orthopaedic Surgery

## 2019-12-19 NOTE — Telephone Encounter (Signed)
Patient was seen by Dr Luna Glasgow on Wednesday morning in the [place] office.  I called her Wednesday afternoon to give her a follow up appointment but had to leave a message.  She called back today(at 1:45) not for the appointment but was upset because she thought Dr. Luna Glasgow was going to give her Tramadol and Zanaflex.  She said that is what they discussed . She was quite upset about this.  I told her that Dr. Luna Glasgow was here in the office this morning but was now gone until next Tuesday. She became upset and wanted to know why he didn't give her all her medicines.  I told her I couldn't answer that.  She then got loud with me and wanted to know if there was someone here that could give her the medication.  Before I could say anything more, she told me that I just didn't understand how bad she hurts and then said to me "I just cant do this anymore with you" and then hung up on me.    I did not try to call her back because I knew she was upset.  Hopefully she will call back once she calms down.

## 2019-12-20 ENCOUNTER — Ambulatory Visit (HOSPITAL_COMMUNITY): Payer: Medicare Other

## 2019-12-20 ENCOUNTER — Other Ambulatory Visit (HOSPITAL_COMMUNITY): Payer: Medicare Other

## 2019-12-22 MED ORDER — TIZANIDINE HCL 4 MG PO TABS: ORAL_TABLET | ORAL | 3 refills | Status: DC

## 2019-12-23 ENCOUNTER — Encounter (HOSPITAL_COMMUNITY): Payer: Medicare Other

## 2019-12-23 ENCOUNTER — Encounter (HOSPITAL_COMMUNITY)
Admission: RE | Admit: 2019-12-23 | Discharge: 2019-12-23 | Disposition: A | Discharge status: Home / Self Care | Payer: Medicare Other | Source: Ambulatory Visit | Attending: Nephrology | Admitting: Nephrology

## 2019-12-23 ENCOUNTER — Telehealth: Payer: Self-pay | Admitting: Orthopaedic Surgery

## 2019-12-23 NOTE — Telephone Encounter (Signed)
I called the [facility] for this patient to make sure that they received Dr. Brooke Bonito prescription for Tizanidine that he sent in last night.   They said they did and were working on getting it filled for the patient.  I then called and informed [name] that Dr. Luna Glasgow sent in the Tizanidine for her and that I called the pharmacy and they are filling the prescription for her.   She said ok.

## 2019-12-23 NOTE — Telephone Encounter (Signed)
Thanks

## 2020-01-01 ENCOUNTER — Ambulatory Visit: Payer: Medicare Other | Admitting: Neurology

## 2020-01-09 ENCOUNTER — Encounter: Payer: Self-pay | Admitting: Orthopaedic Surgery

## 2020-01-09 ENCOUNTER — Ambulatory Visit (INDEPENDENT_AMBULATORY_CARE_PROVIDER_SITE_OTHER): Payer: Medicare Other | Admitting: Orthopaedic Surgery

## 2020-01-09 ENCOUNTER — Other Ambulatory Visit: Payer: Self-pay

## 2020-01-09 DIAGNOSIS — N186 End stage renal disease: Secondary | ICD-10-CM

## 2020-01-09 DIAGNOSIS — M25512 Pain in left shoulder: Secondary | ICD-10-CM | POA: Diagnosis not present

## 2020-01-09 DIAGNOSIS — G8929 Other chronic pain: Secondary | ICD-10-CM | POA: Diagnosis not present

## 2020-01-09 NOTE — Progress Notes (Signed)
PROCEDURE NOTE:  The patient request injection, verbal consent was obtained.  The left shoulder was prepped appropriately after time out was performed.   Sterile technique was observed and injection of 1 cc of Depo-Medrol 40 mg with several cc's of plain xylocaine. Anesthesia was provided by ethyl chloride and a 20-gauge needle was used to inject the shoulder area. A posterior approach was used.  The injection was tolerated well.  A band aid dressing was applied.  The patient was advised to apply ice later today and tomorrow to the injection sight as needed.  Keep regular appt Electronically Signed Sanjuana Kava, MD 10/21/20213:02 PM

## 2020-01-17 ENCOUNTER — Ambulatory Visit (HOSPITAL_COMMUNITY): Payer: Medicare Other

## 2020-01-20 ENCOUNTER — Encounter: Payer: Medicare Other | Admitting: Vascular Surgery

## 2020-01-20 ENCOUNTER — Telehealth: Payer: Self-pay | Admitting: Orthopedic Surgery

## 2020-01-20 MED ORDER — TRAMADOL HCL 50 MG PO TABS
ORAL_TABLET | ORAL | 1 refills | Status: DC
Start: 2020-01-20 — End: 2020-02-17

## 2020-01-20 NOTE — Telephone Encounter (Signed)
Patient called and requested refill on Tramadol 50 mgs.  Qty 90  Sig: TAKE 1 TABLET BY MOUTH EVERY SIX HOURS AS NEEDED.  Patient states she uses [facility]

## 2020-01-28 ENCOUNTER — Ambulatory Visit (HOSPITAL_COMMUNITY)
Admission: RE | Admit: 2020-01-28 | Discharge: 2020-01-28 | Disposition: A | Discharge status: Home / Self Care | Payer: Medicare Other | Source: Ambulatory Visit | Attending: Vascular Surgery | Admitting: Vascular Surgery

## 2020-01-28 ENCOUNTER — Other Ambulatory Visit: Payer: Self-pay

## 2020-01-28 DIAGNOSIS — N184 Chronic kidney disease, stage 4 (severe): Secondary | ICD-10-CM | POA: Diagnosis not present

## 2020-01-29 ENCOUNTER — Ambulatory Visit: Payer: Medicare Other | Admitting: Internal Medicine

## 2020-02-03 ENCOUNTER — Ambulatory Visit (INDEPENDENT_AMBULATORY_CARE_PROVIDER_SITE_OTHER): Payer: Self-pay | Admitting: Vascular Surgery

## 2020-02-03 ENCOUNTER — Other Ambulatory Visit: Payer: Self-pay

## 2020-02-03 ENCOUNTER — Encounter: Payer: Self-pay | Admitting: Vascular Surgery

## 2020-02-03 VITALS — BP 162/106 | HR 81 | Temp 98.4°F | Resp 14 | Ht 59.0 in | Wt 141.0 lb

## 2020-02-03 DIAGNOSIS — Z992 Dependence on renal dialysis: Secondary | ICD-10-CM

## 2020-02-03 DIAGNOSIS — N186 End stage renal disease: Secondary | ICD-10-CM

## 2020-02-03 NOTE — Progress Notes (Signed)
[facility] [place]  Patient name: [name] MRN: [id number] DOB: [birth year] Sex: female  REASON FOR VISIT: Follow-up status post left brachiocephalic fistula creation on 12/12/2019  HPI: [name] is a 55 y.o. female here today for follow-up.  She has had no steal symptoms.  Her incision is healed nicely.  Current Outpatient Medications  Medication Sig Dispense Refill  . acetaminophen (TYLENOL 8 HOUR) 650 MG CR tablet Take 650 mg by mouth every 8 (eight) hours as needed for pain.    . calcitRIOL (ROCALTROL) 0.5 MCG capsule Take 1 mcg by mouth daily.     . carvedilol (COREG) 12.5 MG tablet Take 1 tablet (12.5 mg total) by mouth 2 (two) times daily with a meal. 60 tablet 2  . epoetin alfa-epbx (RETACRIT) 08657 UNIT/ML injection 20,000 Units every 30 (thirty) days.    . furosemide (LASIX) 80 MG tablet Take 1 tablet (80 mg total) by mouth 2 (two) times daily. 60 tablet 3  . hydrALAZINE (APRESOLINE) 50 MG tablet Take 1 tablet (50 mg total) by mouth every 8 (eight) hours. 90 tablet 2  . hydrOXYzine (ATARAX/VISTARIL) 25 MG tablet Take 1 tablet (25 mg total) by mouth 2 (two) times daily. 60 tablet 2  . levETIRAcetam (KEPPRA XR) 500 MG 24 hr tablet Take 1 tablet (500 mg total) by mouth daily. 90 tablet 3  . pantoprazole (PROTONIX) 40 MG tablet Take 1 tablet (40 mg total) by mouth daily. 30 tablet 2  . promethazine (PHENERGAN) 25 MG tablet Take 25 mg by mouth every 6 (six) hours as needed.    Marland Kitchen tiZANidine (ZANAFLEX) 4 MG tablet One by mouth every night before bed as needed for spasm 30 tablet 3  . traMADol (ULTRAM) 50 MG tablet TAKE 1 TABLET BY MOUTH EVERY SIX HOURS AS NEEDED. 90 tablet 1  . amLODipine (NORVASC) 10 MG tablet Take 1 tablet (10 mg total) by mouth daily. (Patient not taking: Reported on 12/18/2019) 30 tablet 3  . oxyCODONE-acetaminophen (PERCOCET/ROXICET) 5-325 MG tablet Take 1 tablet by mouth every 4 (four) hours as  needed for moderate pain. (Patient not taking: Reported on 01/09/2020) 10 tablet 0  . venlafaxine XR (EFFEXOR-XR) 150 MG 24 hr capsule Take 150 mg by mouth daily with breakfast. (Patient not taking: Reported on 01/09/2020)     No current facility-administered medications for this visit.     PHYSICAL EXAM: Vitals:   02/03/20 0859  BP: (!) 162/106  Pulse: 81  Resp: 14  Temp: 98.4 F (36.9 C)  TempSrc: Oral  SpO2: 98%  Weight: 141 lb (64 kg)  Height: 4\' 11"  (1.499 m)    GENERAL: The patient is a well-nourished female, in no acute distress. The vital signs are documented above. She has an excellent thrill in her upper arm cephalic vein.  Incision is completely healed.  Duplex at [facility] on 01/28/2020 was reviewed.  This shows the diameter ranging from 3.5 to 5.5 cm in her cephalic vein  I imaged her veins with SonoSite ultrasound.  She does have a large competing branch that arises above the level of the antecubital space.  Also has some tortuosity in the vein.  The vein does run relatively superficial.  MEDICAL ISSUES: Good initial healing of her vein.  I did discuss that she may require additional outpatient treatment to optimize her fistula.  For now I would recommend continued observation office visit in 1 month to determine if it is ready to use  for dialysis or whether branch ligation and potentially superficial laceration would be required.  We will see her again in 1 month   Rosetta Posner, MD [facility] Tel [phone]

## 2020-02-17 ENCOUNTER — Other Ambulatory Visit: Payer: Self-pay

## 2020-02-17 ENCOUNTER — Other Ambulatory Visit: Payer: Self-pay | Admitting: Orthopaedic Surgery

## 2020-02-17 ENCOUNTER — Emergency Department (HOSPITAL_COMMUNITY): Payer: Medicare Other

## 2020-02-17 ENCOUNTER — Emergency Department (HOSPITAL_COMMUNITY)
Admission: EM | Admit: 2020-02-17 | Discharge: 2020-02-17 | Disposition: A | Discharge status: Home / Self Care | Payer: Medicare Other | Attending: Emergency Medicine | Admitting: Emergency Medicine

## 2020-02-17 ENCOUNTER — Encounter (HOSPITAL_COMMUNITY): Payer: Self-pay | Admitting: Emergency Medicine

## 2020-02-17 DIAGNOSIS — Z87891 Personal history of nicotine dependence: Secondary | ICD-10-CM | POA: Insufficient documentation

## 2020-02-17 DIAGNOSIS — I120 Hypertensive chronic kidney disease with stage 5 chronic kidney disease or end stage renal disease: Secondary | ICD-10-CM | POA: Insufficient documentation

## 2020-02-17 DIAGNOSIS — Z992 Dependence on renal dialysis: Secondary | ICD-10-CM | POA: Insufficient documentation

## 2020-02-17 DIAGNOSIS — N186 End stage renal disease: Secondary | ICD-10-CM | POA: Insufficient documentation

## 2020-02-17 DIAGNOSIS — Z79899 Other long term (current) drug therapy: Secondary | ICD-10-CM | POA: Insufficient documentation

## 2020-02-17 DIAGNOSIS — R531 Weakness: Secondary | ICD-10-CM | POA: Insufficient documentation

## 2020-02-17 DIAGNOSIS — R5383 Other fatigue: Secondary | ICD-10-CM | POA: Diagnosis present

## 2020-02-17 LAB — COMPREHENSIVE METABOLIC PANEL
ALT: 11 U/L (ref 0–44)
AST: 15 U/L (ref 15–41)
Albumin: 3.4 g/dL — ABNORMAL LOW (ref 3.5–5.0)
Alkaline Phosphatase: 72 U/L (ref 38–126)
Anion gap: 14 (ref 5–15)
BUN: 20 mg/dL (ref 6–20)
CO2: 27 mmol/L (ref 22–32)
Calcium: 8 mg/dL — ABNORMAL LOW (ref 8.9–10.3)
Chloride: 95 mmol/L — ABNORMAL LOW (ref 98–111)
Creatinine, Ser: 4.61 mg/dL — ABNORMAL HIGH (ref 0.44–1.00)
GFR, Estimated: 11 mL/min — ABNORMAL LOW (ref >60)
Glucose, Bld: 89 mg/dL (ref 70–99)
Potassium: 2.9 mmol/L — ABNORMAL LOW (ref 3.5–5.1)
Sodium: 136 mmol/L (ref 135–145)
Total Bilirubin: 0.7 mg/dL (ref 0.3–1.2)
Total Protein: 6.1 g/dL — ABNORMAL LOW (ref 6.5–8.1)

## 2020-02-17 LAB — CBC WITH DIFFERENTIAL/PLATELET
Abs Immature Granulocytes: 0.03 10*3/uL (ref 0.00–0.07)
Basophils Absolute: 0 10*3/uL (ref 0.0–0.1)
Basophils Relative: 0 %
Eosinophils Absolute: 0 10*3/uL (ref 0.0–0.5)
Eosinophils Relative: 0 %
HCT: 31.1 % — ABNORMAL LOW (ref 36.0–46.0)
Hemoglobin: 9.9 g/dL — ABNORMAL LOW (ref 12.0–15.0)
Immature Granulocytes: 0 %
Lymphocytes Relative: 21 %
Lymphs Abs: 1.4 10*3/uL (ref 0.7–4.0)
MCH: 29.8 pg (ref 26.0–34.0)
MCHC: 31.8 g/dL (ref 30.0–36.0)
MCV: 93.7 fL (ref 80.0–100.0)
Monocytes Absolute: 1 10*3/uL (ref 0.1–1.0)
Monocytes Relative: 14 %
Neutro Abs: 4.5 10*3/uL (ref 1.7–7.7)
Neutrophils Relative %: 65 %
Platelets: 161 10*3/uL (ref 150–400)
RBC: 3.32 MIL/uL — ABNORMAL LOW (ref 3.87–5.11)
RDW: 14.6 % (ref 11.5–15.5)
WBC: 6.9 10*3/uL (ref 4.0–10.5)
nRBC: 0 % (ref 0.0–0.2)

## 2020-02-17 MED ORDER — TRAMADOL HCL 50 MG PO TABS
ORAL_TABLET | ORAL | 0 refills | Status: DC
Start: 2020-02-17 — End: 2020-03-23

## 2020-02-17 NOTE — ED Provider Notes (Signed)
[facility] EMERGENCY DEPARTMENT Provider Note   CSN: [id number] Arrival date & time: 02/17/20  1749     History Chief Complaint  Patient presents with  . Weakness    [name] is a 54 y.o. female.  HPI Patient is a 54 year old female with a medical history as noted below.  She has a history of ESRD on HD and is a Monday/Wednesday/Friday patient.  Patient recently started hemodialysis in September.  Today she finished a normal session and began experiencing fatigue as well as hypotension.  She then began experiencing mild pain in the eyes.  She feels that this alleviates when lying down and worsens with standing.  No LOC.  Patient was given IV fluids at the dialysis center which she states provided mild improvement in her symptoms.  She had some mild nausea initially which is improved.  No chest pain, shortness of breath, headache, abdominal pain, vomiting. Pt states that she has experienced sx similar after prior HD sessions but denies eye pain or any of this severity.     Past Medical History:  Diagnosis Date  . Anemia   . Anxiety   . Bipolar disorder ([place])   . Depression   . GERD (gastroesophageal reflux disease)   . History of blood transfusion   . Hypertension   . IUD    HISTORY OF IUD --REMOVED IN 2006  . Seizures ([place]) [id number]   two seizures due to a med changes    Patient Active Problem List   Diagnosis Date Noted  . ESRD (end stage renal disease) ([place])   . Hypertensive crisis 12/07/2019  . DNR (do not resuscitate)   . Palliative care by specialist   . Goals of care, counseling/discussion   . Hypocalcemia 07/21/2019  . Acute renal failure superimposed on stage 4 chronic kidney disease ([place]) 09/10/2018  . Hypertensive urgency, malignant   . Acute renal failure superimposed on chronic kidney disease ([place])   . Hypertensive emergency 09/08/2018  . Acute renal injury ([place]) 09/08/2018  . Alkaline phosphatase elevation 03/29/2018  . B12 deficiency  03/29/2018  . Vitamin D deficiency 03/29/2018  . History of herpes genitalis 03/22/2018  . Obesity (BMI 30.0-34.9) 03/22/2018  . Osteoarthritis 03/22/2018  . Seizure ([place]) 07/06/2017  . Noncompliance with medication regimen 04/27/2017  . Vasovagal syncope 03/29/2017  . Rotator cuff arthropathy of left shoulder 07/07/2016  . S/P shoulder surgery 07/07/2016  . LGSIL on Pap smear of cervix 04/26/2016  . Herpes simplex vulvovaginitis 03/01/2016  . Lower extremity edema 07/20/2015  . Chronic kidney disease, stage IV (severe) ([place]) 05/14/2015  . Proteinuria 05/14/2015  . Generalized headaches 01/05/2015  . Weight gain following gastric bypass surgery 08/13/2014  . Deficiency anemia 01/06/2014  . Iron deficiency anemia 01/06/2014  . Vitamin B12 deficiency (dietary) anemia 01/06/2014  . Essential hypertension, benign 01/25/2013  . Bipolar disorder [facility])     Past Surgical History:  Procedure Laterality Date  . AV FISTULA PLACEMENT Left 12/12/2019   Procedure: LEFT ARM ARTERIOVENOUS FISTULA;  Surgeon: Rosetta Posner, MD;  Location: [facility];  Service: Vascular;  Laterality: Left;  . CARPAL TUNNEL RELEASE Right 07/08/2014   Procedure: RIGHT CARPAL TUNNEL RELEASE;  Surgeon: Sanjuana Kava, MD;  Location: [facility];  Service: Orthopedics;  Laterality: Right;  . CHOLECYSTECTOMY    . GASTRIC BYPASS  2000  . HYSTEROSCOPY WITH D & C  11/14/2011   Procedure: DILATATION AND CURETTAGE /HYSTEROSCOPY;  Surgeon: Marylynn Pearson, MD;  Location: [place] [facility];  Service:  Gynecology;;  with removal of polyps  . INTRAUTERINE DEVICE (IUD) INSERTION  03/21/2010  . IR PERC TUN PERIT CATH WO PORT S&I Dartha Lodge  12/10/2019  . IR US GUIDE VASC ACCESS RIGHT  12/10/2019  . LAPAROSCOPIC GASTROTOMY W/ REPAIR OF ULCER    . PANNICULECTOMY    . ROTATOR CUFF REPAIR       OB History    Gravida  1   Para      Term      Preterm      AB  1   Living  0     SAB  1   TAB      Ectopic      Multiple      Live Births                Family History  Problem Relation Age of Onset  . Hypertension Mother   . Kidney failure Mother   . Hypertension Father   . Diabetes Cousin   . Diabetes Sister   . Hypertension Sister   . Kidney disease Sister        dialysis  . Hypertension Brother   . Heart attack Brother   . Hypertension Maternal Aunt   . Hypertension Maternal Uncle   . Hypertension Maternal Grandmother   . Hypertension Maternal Grandfather   . Hypertension Paternal Grandmother   . Hypertension Paternal Grandfather   . Heart attack Brother   . Hypertension Brother   . Hypertension Sister     Social History   Tobacco Use  . Smoking status: Former Smoker    Packs/day: 0.25    Years: 4.00    Pack years: 1.00    Types: Cigarettes    Quit date: 03/21/1988    Years since quitting: 31.9  . Smokeless tobacco: Never Used  Vaping Use  . Vaping Use: Never used  Substance Use Topics  . Alcohol use: Yes    Comment: 1-2 times a year.   . Drug use: No    Home Medications Prior to Admission medications   Medication Sig Start Date End Date Taking? Authorizing Provider  acetaminophen (TYLENOL 8 HOUR) 650 MG CR tablet Take 650 mg by mouth every 8 (eight) hours as needed for pain.    [provider]  amLODipine (NORVASC) 10 MG tablet Take 1 tablet (10 mg total) by mouth daily. Patient not taking: Reported on 12/18/2019 12/14/19 01/13/20  Heath Lark D, DO  calcitRIOL (ROCALTROL) 0.5 MCG capsule Take 1 mcg by mouth daily.  11/21/19   [provider]  carvedilol (COREG) 12.5 MG tablet Take 1 tablet (12.5 mg total) by mouth 2 (two) times daily with a meal. 12/13/19 02/03/20  Manuella Ghazi, Pratik D, DO  epoetin alfa-epbx (RETACRIT) 48546 UNIT/ML injection 20,000 Units every 30 (thirty) days.    Madelon Lips, MD  furosemide (LASIX) 80 MG tablet Take 1 tablet (80 mg total) by mouth 2 (two) times daily. 12/13/19 02/03/20  Manuella Ghazi, Pratik D, DO  hydrALAZINE (APRESOLINE) 50 MG tablet Take 1 tablet (50 mg total)  by mouth every 8 (eight) hours. 12/13/19 02/03/20  Manuella Ghazi, Pratik D, DO  hydrOXYzine (ATARAX/VISTARIL) 25 MG tablet Take 1 tablet (25 mg total) by mouth 2 (two) times daily. 07/25/19   Roxan Hockey, MD  levETIRAcetam (KEPPRA XR) 500 MG 24 hr tablet Take 1 tablet (500 mg total) by mouth daily. 08/02/19   Cameron Sprang, MD  oxyCODONE-acetaminophen (PERCOCET/ROXICET) 5-325 MG tablet Take 1 tablet by mouth every 4 (  four) hours as needed for moderate pain. Patient not taking: Reported on 01/09/2020 12/13/19   Heath Lark D, DO  pantoprazole (PROTONIX) 40 MG tablet Take 1 tablet (40 mg total) by mouth daily. 12/13/19 02/03/20  Manuella Ghazi, Pratik D, DO  promethazine (PHENERGAN) 25 MG tablet Take 25 mg by mouth every 6 (six) hours as needed. 01/01/19   [provider]  tiZANidine (ZANAFLEX) 4 MG tablet One by mouth every night before bed as needed for spasm 12/22/19   Sanjuana Kava, MD  traMADol (ULTRAM) 50 MG tablet TAKE 1 TABLET BY MOUTH EVERY SIX HOURS AS NEEDED. 02/17/20   Carole Civil, MD  venlafaxine XR (EFFEXOR-XR) 150 MG 24 hr capsule Take 150 mg by mouth daily with breakfast. Patient not taking: Reported on 01/09/2020    [provider]    Allergies    Ambien [zolpidem tartrate], Geodon [ziprasidone hcl], Lactulose, Ondansetron, Quetiapine, Sulfa antibiotics, and Zolpidem  Review of Systems   Review of Systems  All other systems reviewed and are negative. Ten systems reviewed and are negative for acute change, except as noted in the HPI.    Physical Exam Updated Vital Signs BP 104/73   Resp 14   Ht 4\' 11"  (1.499 m)   Wt 64 kg   BMI 28.48 kg/m   Physical Exam Vitals and nursing note reviewed.  Constitutional:      General: She is not in acute distress.    Appearance: Normal appearance. She is not ill-appearing, toxic-appearing or diaphoretic.     Comments: Fatigued appearing adult female.  She speaks clearly and coherently.  HENT:     Head: Normocephalic and  atraumatic.     Right Ear: External ear normal.     Left Ear: External ear normal.     Nose: Nose normal.     Mouth/Throat:     Mouth: Mucous membranes are moist.     Pharynx: Oropharynx is clear. No oropharyngeal exudate or posterior oropharyngeal erythema.  Eyes:     General: No scleral icterus.       Right eye: No discharge.        Left eye: No discharge.     Extraocular Movements: Extraocular movements intact.     Conjunctiva/sclera: Conjunctivae normal.     Pupils: Pupils are equal, round, and reactive to light.     Comments: Pupils are equal, round, and reactive to light.  Extraocular movements are intact.  Eyes are noninjected.  Cardiovascular:     Rate and Rhythm: Normal rate and regular rhythm.     Pulses: Normal pulses.     Heart sounds: Normal heart sounds. No murmur heard.  No friction rub. No gallop.      Comments: Regular rate and rhythm without murmurs, rubs, gallops.  Orthostatic vital signs:  102/76 when lying flat 93/78 when standing 96/77 when standing after 3 minutes Pulmonary:     Effort: Pulmonary effort is normal. No respiratory distress.     Breath sounds: Normal breath sounds. No stridor. No wheezing, rhonchi or rales.  Abdominal:     General: Abdomen is flat.     Palpations: Abdomen is soft.     Tenderness: There is no abdominal tenderness.     Comments: Abdomen is flat soft and nontender.  Musculoskeletal:        General: Normal range of motion.     Cervical back: Normal range of motion and neck supple. No tenderness.  Skin:    General: Skin is warm and dry.  Neurological:     General: No focal deficit present.     Mental Status: She is alert and oriented to person, place, and time.     Comments: Moving all 4 extremities without difficulty.  Able to stand and ambulate unassisted.  No gross deficits.  Psychiatric:        Mood and Affect: Mood normal.        Behavior: Behavior normal.    ED Results / Procedures / Treatments   Labs (all labs  ordered are listed, but only abnormal results are displayed) Labs Reviewed  CBC WITH DIFFERENTIAL/PLATELET - Abnormal; Notable for the following components:      Result Value   RBC 3.32 (*)    Hemoglobin 9.9 (*)    HCT 31.1 (*)    All other components within normal limits  COMPREHENSIVE METABOLIC PANEL   EKG None  Radiology No results found.  Procedures Procedures (including critical care time)  Medications Ordered in ED Medications - No data to display  ED Course  I have reviewed the triage vital signs and the nursing notes.  Pertinent labs & imaging results that were available during my care of the patient were reviewed by me and considered in my medical decision making (see chart for details).  Clinical Course as of Feb 17 2035  Mon Feb 17, 2020  2018 Chronic small vessel disease without acute intracranial abnormalities.  CT Head Wo Contrast [LJ]    Clinical Course User Index [LJ] Rayna Sexton, PA-C   MDM Rules/Calculators/A&P                          Patient is a 54 year old female who presents to the emergency department with weakness/fatigue after a hemodialysis session earlier today.  She had a normal session.  She was initially hypotensive, per patient, and states that she received some normal saline while at the dialysis clinic.  On my exam patient appeared extremely fatigued but had no gross weakness.  I personally obtained her orthostatic vital signs which were reassuring.  Given her symptoms I obtained a CT scan of the head which was reassuring.  Patient ambulated by our nursing staff and she was able to ambulate unassisted without difficulty.  It is in my shift and patient care is being transferred to [facility].  Patient is pending labs as well as an ECG.  If these are reassuring fill that it is safe to discharge patient home at this time.  Final Clinical Impression(s) / ED Diagnoses Final diagnoses:  Weakness   Rx / DC Orders ED Discharge Orders      None       Rayna Sexton, PA-C 02/17/20 2059    Truddie Hidden, MD 02/17/20 2128

## 2020-02-17 NOTE — Discharge Instructions (Addendum)
Please continue to go to your scheduled hemodialysis sessions.  Please also follow-up with your regular doctor regarding your symptoms.  If you develop any new or worsening symptoms please return to the emergency department.  It was a pleasure to meet you.  At this time there does not appear to be the presence of an emergent medical condition, however there is always the potential for conditions to change. Please read and follow the below instructions.  Please return to the Emergency Department immediately for any new or worsening symptoms. Please be sure to follow up with your Primary Care Provider.  Please read the additional information packets attached to your discharge summary.  Do not take your medicine if  develop an itchy rash, swelling in your mouth or lips, or difficulty breathing; call 911 and seek immediate emergency medical attention if this occurs.  You may review your lab tests and imaging results in their entirety on your MyChart account.  Please discuss all results of fully with your primary care provider and other specialist at your follow-up visit.  Note: Portions of this text may have been transcribed using voice recognition software. Every effort was made to ensure accuracy; however, inadvertent computerized transcription errors may still be present.

## 2020-02-17 NOTE — ED Provider Notes (Addendum)
Care handoff received from [facility] PA-C at shift change please see previous providers note for full details of visit.  In short 54 year old female arrived from dialysis session for generalized weakness and eye pain, noted to be hypotensive at dialysis given fluid BP improved.  Patient reported she experienced similar symptoms after previous dialysis sessions but without high pain before.  Orthostatics performed by previous team were negative.  Eye exam without abnormality.  CT head was obtained and was negative.  She had a reassuring neuro exam.  Plan of care was to obtain CBC, CMP and EKG anticipate discharge if no emergent findings. Physical Exam  BP 98/67   Pulse 90   Resp 13   Ht 4\' 11"  (1.499 m)   Wt 64 kg   SpO2 100%   BMI 28.48 kg/m   Physical Exam Constitutional:      General: She is not in acute distress.    Appearance: Normal appearance. She is well-developed. She is not ill-appearing or diaphoretic.  HENT:     Head: Normocephalic and atraumatic.  Eyes:     General: Vision grossly intact. Gaze aligned appropriately.     Pupils: Pupils are equal, round, and reactive to light.  Neck:     Trachea: Trachea and phonation normal.  Pulmonary:     Effort: Pulmonary effort is normal. No respiratory distress.  Abdominal:     General: There is no distension.     Palpations: Abdomen is soft.     Tenderness: There is no abdominal tenderness. There is no guarding or rebound.  Musculoskeletal:        General: Normal range of motion.     Cervical back: Normal range of motion.  Skin:    General: Skin is warm and dry.  Neurological:     Mental Status: She is alert.     GCS: GCS eye subscore is 4. GCS verbal subscore is 5. GCS motor subscore is 6.     Comments: Speech is clear and goal oriented, follows commands Major Cranial nerves without deficit, no facial droop Moves extremities without ataxia, coordination intact  Psychiatric:        Behavior: Behavior normal.     ED  Course/Procedures   Clinical Course as of Feb 16 2254  Mon Feb 17, 2020  2018 Chronic small vessel disease without acute intracranial abnormalities.  CT Head Wo Contrast [LJ]    Clinical Course User Index [LJ] Rayna Sexton, PA-C    Procedures  MDM  CT Head:  IMPRESSION:  Chronic small vessel disease without acute intracranial abnormality.   EKG: Sinus rhythm Borderline T abnormalities, diffuse leads Borderline prolonged QT interval Since last tracing Nonspecific T wave abnormality Confirmed by Calvert Cantor [id number]) on 02/17/2020 9:10:17 PM  CBC shows baseline anemia of 9.9, no leukocytosis to suggest infection. CMP shows hypokalemia of 2.9 appears similar to prior, will not replete given this is a dialysis patient.  Glucose within normal limits.  Creatinine appears baseline, LFTs within normal limits, no gap. - Suspect patient's fatigue and hypotension may be secondary to too much fluid being drawn off at her dialysis center today.  She has no chest pain or shortness of breath to suggest ACS PE or other emergent cardiopulmonary etiologies.  She was given some fluids prior to arrival with improvement of blood pressure, no longer orthostatic.  I reassessed the patient at 10:35 PM she is resting comfortably in bed no acute distress playing sit to go on her phone.  She is  requesting discharge.  There is no indication for further work-up or treatment at this time.  At this time there does not appear to be any evidence of an acute emergency medical condition and the patient appears stable for discharge with appropriate outpatient follow up. Diagnosis was discussed with patient who verbalizes understanding of care plan and is agreeable to discharge. I have discussed return precautions with patient who verbalizes understanding. Patient encouraged to follow-up with their PCP. All questions answered.  Patient's case discussed with Dr. Karle Starch who agrees with plan to discharge with follow-up.    Note: Portions of this report may have been transcribed using voice recognition software. Every effort was made to ensure accuracy; however, inadvertent computerized transcription errors may still be present.    Deliah Boston, PA-C 02/17/20 2239    Deliah Boston, PA-C 02/17/20 2255    Truddie Hidden, MD 02/18/20 [phone]

## 2020-02-17 NOTE — ED Triage Notes (Signed)
Pt from dialysis. Pt states after completing dialysis, pt became weak with pain to her eyes. Pt was hypotensive in dialysis. BP stable at this time

## 2020-02-17 NOTE — Telephone Encounter (Signed)
Patient called and is requesting a refill on her pain medicine.  I advised patient I will send it back but Dr. Luna Glasgow will not be in the office till Thursday.    She said no one can fill it today!?!? I advised her I will send it to Dr. Aline Brochure for review.   She states the shot did NOT DO ANYTHING for her and she needs her pain medicine.   I was asking which pharmacy and she wasn't happy and hung up on me.   traMADol HCl (Tab) ULTRAM 50 MG TAKE 1 TABLET BY MOUTH EVERY SIX HOURS AS NEEDED.

## 2020-02-20 ENCOUNTER — Other Ambulatory Visit: Payer: Self-pay

## 2020-02-20 ENCOUNTER — Ambulatory Visit (INDEPENDENT_AMBULATORY_CARE_PROVIDER_SITE_OTHER): Payer: Medicare Other | Admitting: Internal Medicine

## 2020-02-20 VITALS — BP 92/60 | HR 86 | Ht 59.0 in | Wt 134.0 lb

## 2020-02-20 DIAGNOSIS — R5383 Other fatigue: Secondary | ICD-10-CM

## 2020-02-20 DIAGNOSIS — N186 End stage renal disease: Secondary | ICD-10-CM | POA: Diagnosis not present

## 2020-02-20 DIAGNOSIS — R0989 Other specified symptoms and signs involving the circulatory and respiratory systems: Secondary | ICD-10-CM | POA: Diagnosis not present

## 2020-02-20 NOTE — Patient Instructions (Signed)
Medication Instructions:  Your physician has recommended you make the following change in your medication:  DECREASE carvedilol to 12.5mg  twice daily  *If you need a refill on your cardiac medications before your next appointment, please call your pharmacy*  Follow-Up: At [facility], you and your health needs are our priority.  As part of our continuing mission to provide you with exceptional heart care, we have created designated Provider Care Teams.  These Care Teams include your primary Cardiologist (physician) and Advanced Practice Providers (APPs -  Physician Assistants and Nurse Practitioners) who all work together to provide you with the care you need, when you need it.  We recommend signing up for the patient portal called "MyChart".  Sign up information is provided on this After Visit Summary.  MyChart is used to connect with patients for Virtual Visits (Telemedicine).  Patients are able to view lab/test results, encounter notes, upcoming appointments, etc.  Non-urgent messages can be sent to your provider as well.   To learn more about what you can do with MyChart, go to [email or web address].    Your next appointment:   12 month(s)  The format for your next appointment:   In Person  Provider:   You may see Dr. Debara Pickett or one of the following Advanced Practice Providers on your designated Care Team:    Almyra Deforest, PA-C  Fabian Sharp, [place] or   Roby Lofts, [place]    Other Instructions

## 2020-02-20 NOTE — Progress Notes (Signed)
OFFICE CONSULT NOTE  Chief Complaint:  Uncontrolled hypertension  Primary Care Physician: Pixie Casino, MD  HPI:  [name] is a 54 y.o. female who is being seen today for the evaluation of syncope at the request of Tate Jerkins, Nadean Corwin, MD. This is a 54 yo female with a history of anxiety, bipolar disorder, anemia, HTN and seizures. She was seen in the ER on 01/23/2017 for syncope. She had reported generalized fatigue and weakness over the past month leading up to this.  She apparently had an unwitnessed syncopal event.  She said she felt lightheaded when raising up from a seated position.  She also had reported several episodes of fecal incontinence and has a history of IBS as well followed by Dr. Benson Norway.  This is also associated with a chronic abdominal pain.  ER workup was negative without evidence of significant anemia, dehydration, EKG abnormalities or abnormal troponin.  Cardiology follow-up was recommended.  Went to that she was seen in the ER about a week later for seizure.  She said she had stopped taking her Valium.  She also takes Topamax.  Blood pressure is noted to be low today at 102/82.  12/20/2017  [name] returns today for follow-up.  She was scheduled to be followed up as needed however returns today with marked hypertension.  Blood pressure is 184/118.  She says that her PCP may have taken her off of her diuretic and her amlodipine.  Previously when I saw her blood pressure was running low and she had had a syncopal episode.  I actually reduced her amlodipine to 2.5 mg from 5 mg.  Currently she is only on carvedilol.  She has not had any further syncopal episodes.  The elevated blood pressure today however could represent labile hypertension.  Most of her blood pressure readings recently have demonstrated diastolic blood pressures over 90 and systolic pressures in the 150-180 range.  She denies chest pain.  She has seen Dr. Delice Lesch at [facility] neurology and a head MRI  demonstrated small vessel changes consistent with hypertensive disease.  02/20/2020  [name] is seen today in follow-up.  She has had multiple recent hospitalizations and is subsequently been started on dialysis.  She has had significant weight loss.  We received some paperwork from her nephrologist, Dr.Upton.  She is also been struggling with fatigue and weakness.  Additionally she has had hypotension.  She was taken off of amlodipine.  She has been on carvedilol but still struggles with hypotension.  She does not take it on dialysis days.  PMHx:  Past Medical History:  Diagnosis Date  . Anemia   . Anxiety   . Bipolar disorder ([place])   . Depression   . GERD (gastroesophageal reflux disease)   . History of blood transfusion   . Hypertension   . IUD    HISTORY OF IUD --REMOVED IN 2006  . Seizures ([place]) [id number]   two seizures due to a med changes    Past Surgical History:  Procedure Laterality Date  . AV FISTULA PLACEMENT Left 12/12/2019   Procedure: LEFT ARM ARTERIOVENOUS FISTULA;  Surgeon: Rosetta Posner, MD;  Location: [facility];  Service: Vascular;  Laterality: Left;  . CARPAL TUNNEL RELEASE Right 07/08/2014   Procedure: RIGHT CARPAL TUNNEL RELEASE;  Surgeon: Sanjuana Kava, MD;  Location: [facility];  Service: Orthopedics;  Laterality: Right;  . CHOLECYSTECTOMY    . GASTRIC BYPASS  2000  . HYSTEROSCOPY WITH D & C  11/14/2011   Procedure: DILATATION AND CURETTAGE /HYSTEROSCOPY;  Surgeon: Marylynn Pearson, MD;  Location: [facility];  Service: Gynecology;;  with removal of polyps  . INTRAUTERINE DEVICE (IUD) INSERTION  03/21/2010  . IR PERC TUN PERIT CATH WO PORT S&I Dartha Lodge  12/10/2019  . IR US GUIDE VASC ACCESS RIGHT  12/10/2019  . LAPAROSCOPIC GASTROTOMY W/ REPAIR OF ULCER    . PANNICULECTOMY    . ROTATOR CUFF REPAIR      FAMHx:  Family History  Problem Relation Age of Onset  . Hypertension Mother   . Kidney failure Mother   . Hypertension Father   . Diabetes Cousin   . Diabetes Sister    . Hypertension Sister   . Kidney disease Sister        dialysis  . Hypertension Brother   . Heart attack Brother   . Hypertension Maternal Aunt   . Hypertension Maternal Uncle   . Hypertension Maternal Grandmother   . Hypertension Maternal Grandfather   . Hypertension Paternal Grandmother   . Hypertension Paternal Grandfather   . Heart attack Brother   . Hypertension Brother   . Hypertension Sister     SOCHx:   reports that she quit smoking about 31 years ago. Her smoking use included cigarettes. She has a 1.00 pack-year smoking history. She has never used smokeless tobacco. She reports current alcohol use. She reports that she does not use drugs.  ALLERGIES:  Allergies  Allergen Reactions  . Ambien [Zolpidem Tartrate]     "BLACK OUT", SLEEP DRIVING  . Geodon [Ziprasidone Hcl]     Seizure activity  . Lactulose Itching  . Ondansetron Nausea And Vomiting  . Quetiapine     Other reaction(s): Other (See Comments) Possible Hair Loss  . Sulfa Antibiotics Rash  . Zolpidem Rash    ROS: Pertinent items noted in HPI and remainder of comprehensive ROS otherwise negative.  HOME MEDS: Current Outpatient Medications on File Prior to Visit  Medication Sig Dispense Refill  . acetaminophen (TYLENOL 8 HOUR) 650 MG CR tablet Take 650 mg by mouth every 8 (eight) hours as needed for pain.    . calcitRIOL (ROCALTROL) 0.5 MCG capsule Take 1 mcg by mouth daily.     . carvedilol (COREG) 25 MG tablet Take 25 mg by mouth 2 (two) times daily with a meal. Take 1 tablet twice a day in the afternoon the day she take dialysis.    Marland Kitchen epoetin alfa-epbx (RETACRIT) 41660 UNIT/ML injection 20,000 Units every 30 (thirty) days.    . furosemide (LASIX) 80 MG tablet Take 1 tablet (80 mg total) by mouth 2 (two) times daily. 60 tablet 3  . hydrOXYzine (ATARAX/VISTARIL) 25 MG tablet Take 1 tablet (25 mg total) by mouth 2 (two) times daily. 60 tablet 2  . levETIRAcetam (KEPPRA XR) 500 MG 24 hr tablet Take 1  tablet (500 mg total) by mouth daily. 90 tablet 3  . Melatonin 12 MG TABS Take 2 tablets by mouth daily.    Marland Kitchen oxyCODONE-acetaminophen (PERCOCET/ROXICET) 5-325 MG tablet Take 1 tablet by mouth every 4 (four) hours as needed for moderate pain. 10 tablet 0  . promethazine (PHENERGAN) 25 MG tablet Take 25 mg by mouth every 6 (six) hours as needed.    . sevelamer carbonate (RENVELA) 800 MG tablet Take 1,600 mg by mouth 3 (three) times daily.    Marland Kitchen tiZANidine (ZANAFLEX) 4 MG tablet One by mouth every night before bed as needed for spasm 30 tablet 3  .  traMADol (ULTRAM) 50 MG tablet TAKE 1 TABLET BY MOUTH EVERY SIX HOURS AS NEEDED. 90 tablet 0  . clindamycin (CLEOCIN T) 1 % external solution Apply 1 application topically 2 (two) times daily.  (Patient not taking: Reported on 02/20/2020)    . desonide (DESOWEN) 0.05 % cream Apply topically. (Patient not taking: Reported on 02/20/2020)    . pantoprazole (PROTONIX) 40 MG tablet Take 1 tablet (40 mg total) by mouth daily. 30 tablet 2   No current facility-administered medications on file prior to visit.    LABS/IMAGING: No results found for this or any previous visit (from the past 48 hour(s)). No results found.  LIPID PANEL: No results found for: CHOL, TRIG, HDL, CHOLHDL, VLDL, LDLCALC, LDLDIRECT  WEIGHTS: Wt Readings from Last 3 Encounters:  02/20/20 134 lb (60.8 kg)  02/17/20 141 lb (64 kg)  02/03/20 141 lb (64 kg)    VITALS: BP 92/60   Pulse 86   Ht 4\' 11"  (1.499 m)   Wt 134 lb (60.8 kg)   SpO2 96%   BMI 27.06 kg/m   EXAM: General appearance: alert and no distress Neck: no carotid bruit, no JVD and thyroid not enlarged, symmetric, no tenderness/mass/nodules Lungs: clear to auscultation bilaterally Heart: regular rate and rhythm, S1, S2 normal, no murmur, click, rub or gallop Abdomen: soft, non-tender; bowel sounds normal; no masses,  no organomegaly Extremities: edema 1+ pitting edema Pulses: 2+ and symmetric Skin: Skin color,  texture, turgor normal. No rashes or lesions Neurologic: Grossly normal Psych: Pleasant  EKG: Normal sinus rhythm 86-personally reviewed  ASSESSMENT: 1. Symptomatic hypotension 2. ESRD on HD 3. Fatigue 4. Vasovagal syncope 5. ?seizure history - she feels she only had one remote event related to Geodon 6. Labile hypertension 7. LE edema  PLAN: 1.   Mrs. Vincent continues to struggle with fatigue and hypotension.  She is also concerned about anxiety issues and has had difficulty following up with psychiatry.  She does have end-stage renal disease now on hemodialysis and hopes to get renal transplant.  More recently she has had hypotension which she has been symptomatic with.  She was taken off of amlodipine and now is on carvedilol.  I advised decreasing that further to 12.5 mg twice daily.  She should hold it on the days of dialysis but can take it in the evening.  She says that dialysis is very challenging for her and often causes her significant symptoms.  I advised her to speak with her nephrologist about what options she has.  Follow-up annually or sooner as necessary  Pixie Casino, MD, FACC, [place] Director of the [facility] Diplomate of the American Board of Clinical Lipidology Attending Cardiologist  Direct Dial: [phone]  Fax: [phone]  Website:  [email or web address] 02/20/2020, 2:46 PM

## 2020-02-26 ENCOUNTER — Ambulatory Visit (INDEPENDENT_AMBULATORY_CARE_PROVIDER_SITE_OTHER): Payer: Medicare Other | Admitting: Orthopaedic Surgery

## 2020-02-26 ENCOUNTER — Ambulatory Visit: Payer: Self-pay

## 2020-02-26 ENCOUNTER — Encounter: Payer: Self-pay | Admitting: Orthopaedic Surgery

## 2020-02-26 ENCOUNTER — Other Ambulatory Visit: Payer: Self-pay

## 2020-02-26 DIAGNOSIS — M25512 Pain in left shoulder: Secondary | ICD-10-CM

## 2020-02-26 DIAGNOSIS — G8929 Other chronic pain: Secondary | ICD-10-CM

## 2020-02-26 MED ORDER — TIZANIDINE HCL 4 MG PO TABS: ORAL_TABLET | ORAL | 3 refills | Status: AC

## 2020-02-26 NOTE — Progress Notes (Signed)
Patient [name], female DOB:[birth year], 54 y.o. PPJ:[id number]  Chief Complaint  Patient presents with  . Shoulder Pain    L/hurting real bad/8 on pain scale    HPI  [name] is a 54 y.o. female who has more pain in the left shoulder.  The injection last time did not help.  She has constant pain now.  She is considering possible total shoulder.  I will get X-rays today then a MRI.   There is no height or weight on file to calculate BMI.  ROS  Review of Systems  HENT: Negative for congestion.   Respiratory: Negative for cough and shortness of breath.   Cardiovascular: Negative for chest pain and leg swelling.  Endocrine: Positive for cold intolerance.  Genitourinary: Positive for difficulty urinating.       On dialysis  Musculoskeletal: Positive for arthralgias.  Allergic/Immunologic: Positive for environmental allergies.  All other systems reviewed and are negative.   All other systems reviewed and are negative.  The following is a summary of the past history medically, past history surgically, known current medicines, social history and family history.  This information is gathered electronically by the computer from prior information and documentation.  I review this each visit and have found including this information at this point in the chart is beneficial and informative.    Past Medical History:  Diagnosis Date  . Anemia   . Anxiety   . Bipolar disorder ([place])   . Depression   . GERD (gastroesophageal reflux disease)   . History of blood transfusion   . Hypertension   . IUD    HISTORY OF IUD --REMOVED IN 2006  . Seizures ([place]) [id number]   two seizures due to a med changes    Past Surgical History:  Procedure Laterality Date  . AV FISTULA PLACEMENT Left 12/12/2019   Procedure: LEFT ARM ARTERIOVENOUS FISTULA;  Surgeon: Rosetta Posner, MD;  Location: [facility];  Service: Vascular;  Laterality: Left;  . CARPAL TUNNEL RELEASE Right 07/08/2014    Procedure: RIGHT CARPAL TUNNEL RELEASE;  Surgeon: Sanjuana Kava, MD;  Location: [facility];  Service: Orthopedics;  Laterality: Right;  . CHOLECYSTECTOMY    . GASTRIC BYPASS  2000  . HYSTEROSCOPY WITH D & C  11/14/2011   Procedure: DILATATION AND CURETTAGE /HYSTEROSCOPY;  Surgeon: Marylynn Pearson, MD;  Location: [facility];  Service: Gynecology;;  with removal of polyps  . INTRAUTERINE DEVICE (IUD) INSERTION  03/21/2010  . IR PERC TUN PERIT CATH WO PORT S&I Dartha Lodge  12/10/2019  . IR US GUIDE VASC ACCESS RIGHT  12/10/2019  . LAPAROSCOPIC GASTROTOMY W/ REPAIR OF ULCER    . PANNICULECTOMY    . ROTATOR CUFF REPAIR      Family History  Problem Relation Age of Onset  . Hypertension Mother   . Kidney failure Mother   . Hypertension Father   . Diabetes Cousin   . Diabetes Sister   . Hypertension Sister   . Kidney disease Sister        dialysis  . Hypertension Brother   . Heart attack Brother   . Hypertension Maternal Aunt   . Hypertension Maternal Uncle   . Hypertension Maternal Grandmother   . Hypertension Maternal Grandfather   . Hypertension Paternal Grandmother   . Hypertension Paternal Grandfather   . Heart attack Brother   . Hypertension Brother   . Hypertension Sister     Social History Social History   Tobacco Use  . Smoking  status: Former Smoker    Packs/day: 0.25    Years: 4.00    Pack years: 1.00    Types: Cigarettes    Quit date: 03/21/1988    Years since quitting: 31.9  . Smokeless tobacco: Never Used  Vaping Use  . Vaping Use: Never used  Substance Use Topics  . Alcohol use: Yes    Comment: 1-2 times a year.   . Drug use: No    Allergies  Allergen Reactions  . Ambien [Zolpidem Tartrate]     "BLACK OUT", SLEEP DRIVING  . Geodon [Ziprasidone Hcl]     Seizure activity  . Lactulose Itching  . Ondansetron Nausea And Vomiting  . Quetiapine     Other reaction(s): Other (See Comments) Possible Hair Loss  . Sulfa Antibiotics Rash  . Zolpidem Rash    Current  Outpatient Medications  Medication Sig Dispense Refill  . acetaminophen (TYLENOL 8 HOUR) 650 MG CR tablet Take 650 mg by mouth every 8 (eight) hours as needed for pain.    . calcitRIOL (ROCALTROL) 0.5 MCG capsule Take 1 mcg by mouth daily.     . carvedilol (COREG) 12.5 MG tablet Take 12.5 mg by mouth 2 (two) times daily with a meal.    . clindamycin (CLEOCIN T) 1 % external solution Apply 1 application topically 2 (two) times daily.  (Patient not taking: Reported on 02/20/2020)    . desonide (DESOWEN) 0.05 % cream Apply topically. (Patient not taking: Reported on 02/20/2020)    . epoetin alfa-epbx (RETACRIT) 44010 UNIT/ML injection 20,000 Units every 30 (thirty) days.    . furosemide (LASIX) 80 MG tablet Take 1 tablet (80 mg total) by mouth 2 (two) times daily. 60 tablet 3  . hydrOXYzine (ATARAX/VISTARIL) 25 MG tablet Take 1 tablet (25 mg total) by mouth 2 (two) times daily. 60 tablet 2  . levETIRAcetam (KEPPRA XR) 500 MG 24 hr tablet Take 1 tablet (500 mg total) by mouth daily. 90 tablet 3  . Melatonin 12 MG TABS Take 2 tablets by mouth daily.    Marland Kitchen oxyCODONE-acetaminophen (PERCOCET/ROXICET) 5-325 MG tablet Take 1 tablet by mouth every 4 (four) hours as needed for moderate pain. 10 tablet 0  . pantoprazole (PROTONIX) 40 MG tablet Take 1 tablet (40 mg total) by mouth daily. 30 tablet 2  . promethazine (PHENERGAN) 25 MG tablet Take 25 mg by mouth every 6 (six) hours as needed.    . sevelamer carbonate (RENVELA) 800 MG tablet Take 1,600 mg by mouth 3 (three) times daily.    Marland Kitchen tiZANidine (ZANAFLEX) 4 MG tablet One by mouth every night before bed as needed for spasm 30 tablet 3  . traMADol (ULTRAM) 50 MG tablet TAKE 1 TABLET BY MOUTH EVERY SIX HOURS AS NEEDED. 90 tablet 0   No current facility-administered medications for this visit.     Physical Exam  There were no vitals taken for this visit.  Constitutional: overall normal hygiene, normal nutrition, well developed, normal grooming, normal body  habitus. Assistive device:none  Musculoskeletal: gait and station Limp none, muscle tone and strength are normal, no tremors or atrophy is present.  .  Neurological: coordination overall normal.  Deep tendon reflex/nerve stretch intact.  Sensation normal.  Cranial nerves II-XII intact.   Skin:   Normal overall no scars, lesions, ulcers or rashes. No psoriasis.  Psychiatric: Alert and oriented x 3.  Recent memory intact, remote memory unclear.  Normal mood and affect. Well groomed.  Good eye contact.  Cardiovascular: overall  no swelling, no varicosities, no edema bilaterally, normal temperatures of the legs and arms, no clubbing, cyanosis and good capillary refill.  Lymphatic: palpation is normal. Left shoulder with painful ROM and limited motion,  Some crepitus.  NV intact.  All other systems reviewed and are negative   The patient has been educated about the nature of the problem(s) and counseled on treatment options.  The patient appeared to understand what I have discussed and is in agreement with it.  Encounter Diagnosis  Name Primary?  . Chronic left shoulder pain Yes  X-rays were done of the left shoulder, reported separately.  She has severe deformity of the shoulder, marked arthritis. Humeral head is flattened superiorly and at the joint level, glenoid is affected with degenerative changes, apex lung clear, shoulder located.  I have shown her the X-rays.  I will get MRI.  She is candidate for total shoulder.  She is on dialysis and will need medical clearance.  PLAN Call if any problems.  Precautions discussed.  Continue current medications.   Return to clinic after MRI of the left shoulder   I will change her Zanaflex.  Electronically Signed Sanjuana Kava, MD 12/8/202110:43 AM

## 2020-03-02 ENCOUNTER — Ambulatory Visit: Payer: Medicare Other | Admitting: Vascular Surgery

## 2020-03-16 ENCOUNTER — Other Ambulatory Visit: Payer: Self-pay | Admitting: Orthopedic Surgery

## 2020-03-16 NOTE — Telephone Encounter (Signed)
Rx request 

## 2020-03-23 ENCOUNTER — Telehealth: Payer: Self-pay | Admitting: Orthopaedic Surgery

## 2020-03-23 MED ORDER — TRAMADOL HCL 50 MG PO TABS: ORAL_TABLET | ORAL | 0 refills | Status: AC

## 2020-03-23 NOTE — Telephone Encounter (Signed)
Patient called and requested refill on Tramadol 50 mgs.  Qty 90  Sig: TAKE 1 TABLET BY MOUTH EVERY SIX HOURS AS NEEDED.  Patient states she uses [facility]

## 2020-03-26 ENCOUNTER — Other Ambulatory Visit: Payer: Self-pay

## 2020-03-26 ENCOUNTER — Ambulatory Visit (HOSPITAL_COMMUNITY)
Admission: RE | Admit: 2020-03-26 | Discharge: 2020-03-26 | Disposition: A | Discharge status: Home / Self Care | Payer: Medicare Other | Source: Ambulatory Visit | Attending: Orthopaedic Surgery | Admitting: Orthopaedic Surgery

## 2020-03-26 DIAGNOSIS — M25512 Pain in left shoulder: Secondary | ICD-10-CM | POA: Insufficient documentation

## 2020-03-26 DIAGNOSIS — G8929 Other chronic pain: Secondary | ICD-10-CM | POA: Diagnosis present

## 2020-04-14 ENCOUNTER — Other Ambulatory Visit: Payer: Self-pay

## 2020-04-14 ENCOUNTER — Telehealth: Payer: Medicare Other | Admitting: Neurology

## 2020-04-21 DEATH — deceased

## 2020-12-08 ENCOUNTER — Encounter: Payer: Self-pay | Admitting: Internal Medicine

## 2020-12-15 ENCOUNTER — Other Ambulatory Visit: Payer: Self-pay

## 2020-12-15 ENCOUNTER — Telehealth: Payer: Self-pay | Admitting: Neurology

## 2021-09-27 IMAGING — CT CT HEAD W/O CM
3 series · 15 of 47 positions shown, 18 images · non-contrast
Comparison: None.

CLINICAL DATA: Headache

EXAM:
CT HEAD WITHOUT CONTRAST
TECHNIQUE: Contiguous axial images were obtained from the base of the skull
through the vertex without intravenous contrast.

[Series 2: head w o · axial · 0.42mm/px · z∈[+115,+240]mm · 9 of 31 slices shown, 12 images]
[im 3/31  brain]
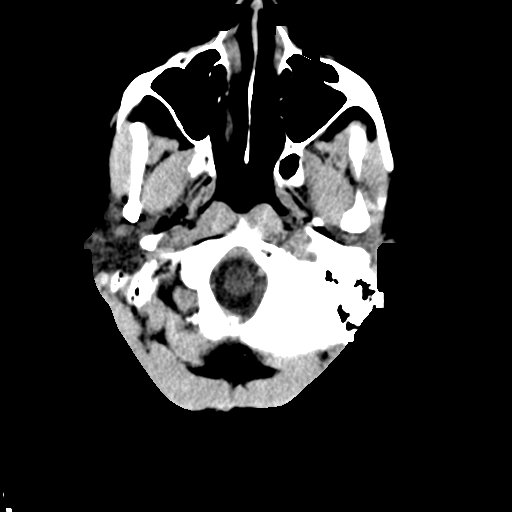
[im 3/31  bone]
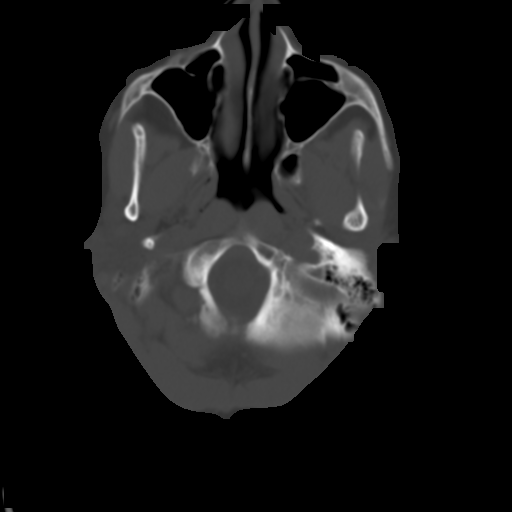
[im 6/31  brain]
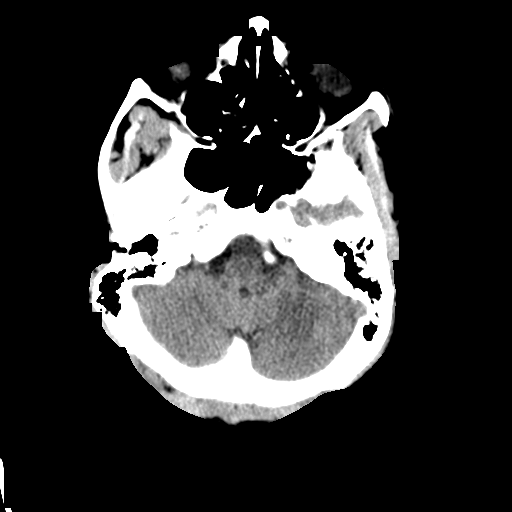
[im 9/31  brain]
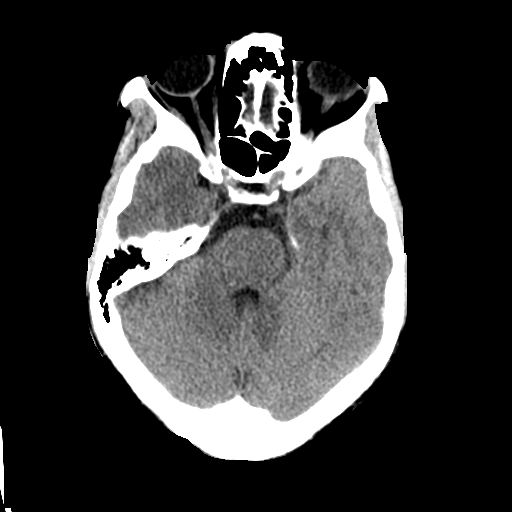
[im 12/31  brain]
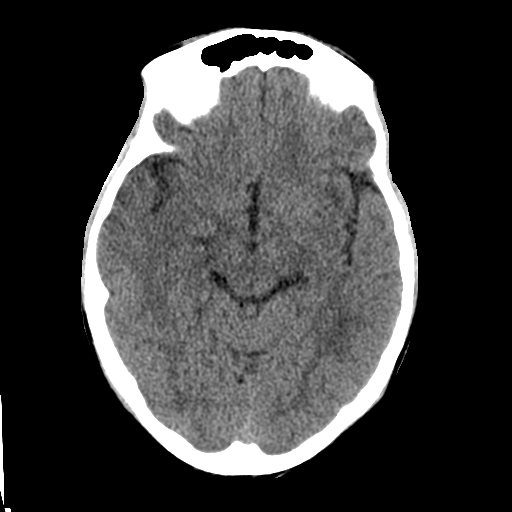
[im 16/31  brain]
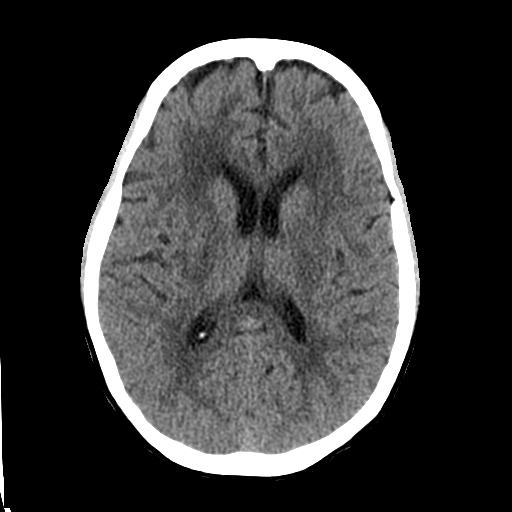
[im 16/31  bone]
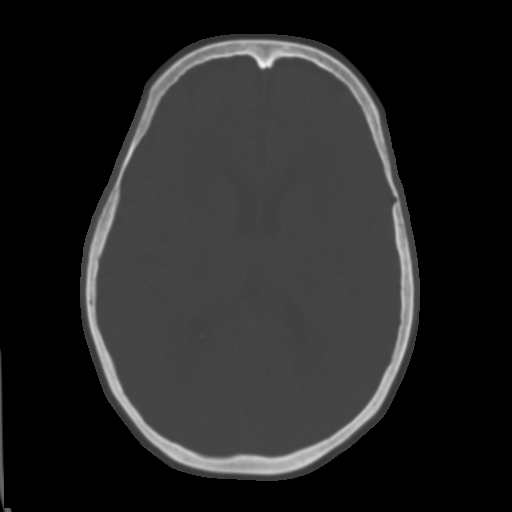
[im 19/31  brain]
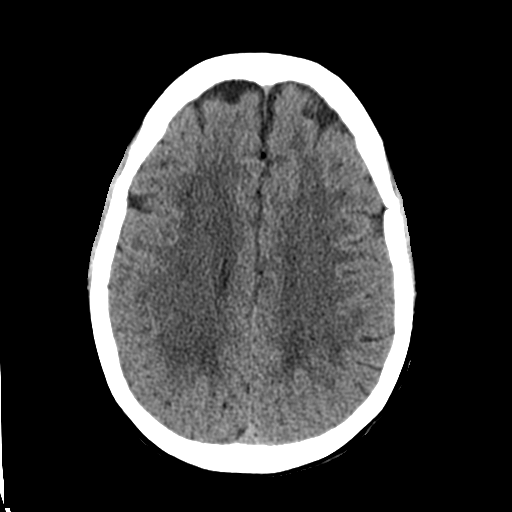
[im 22/31  brain]
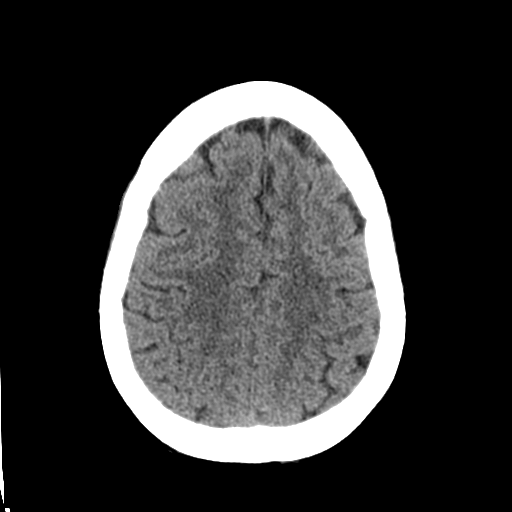
[im 25/31  brain]
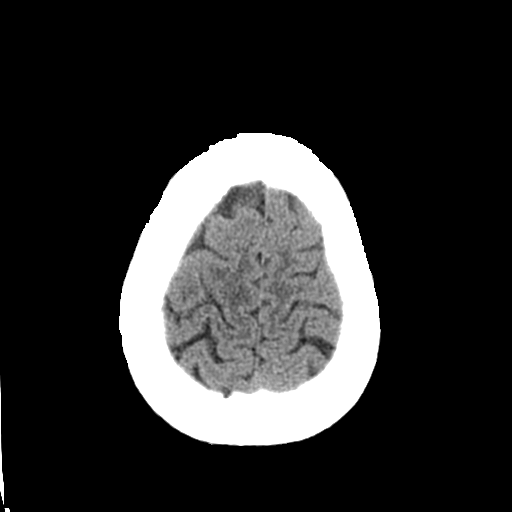
[im 28/31  brain]
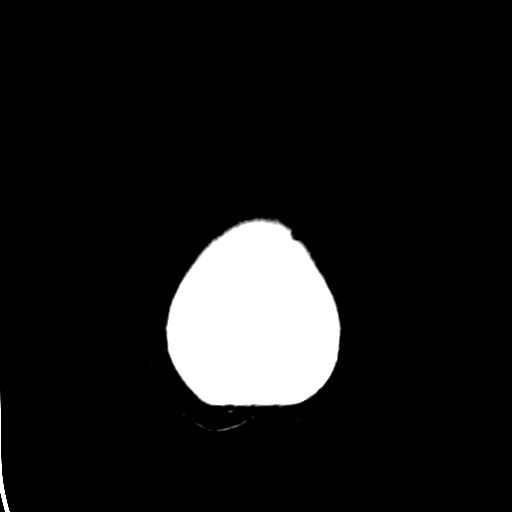
[im 28/31  bone]
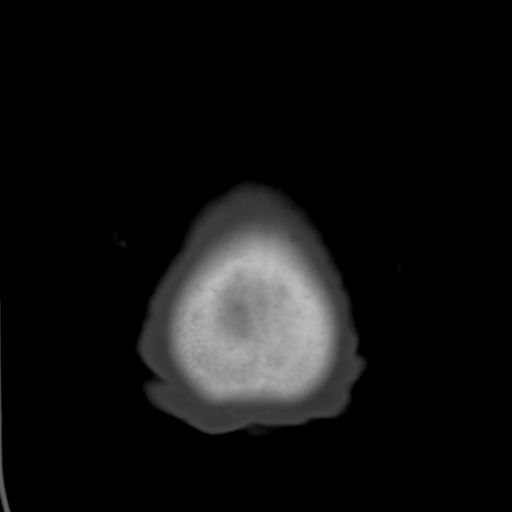

[Series 4: coronal soft · coronal · 0.30mm/px · 3 of 70 slices shown]
[im 24/70  brain]
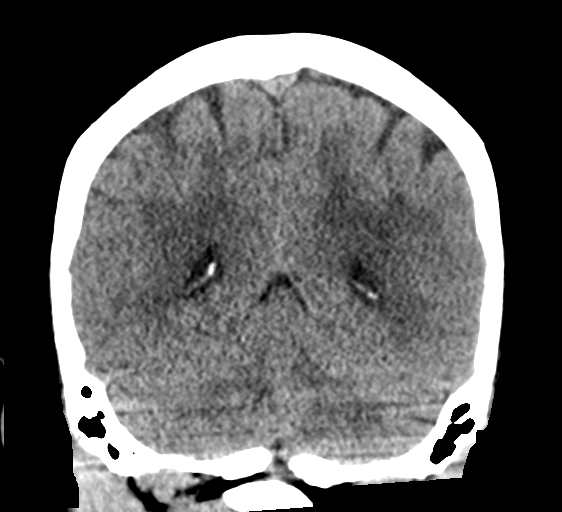
[im 31/70  brain]
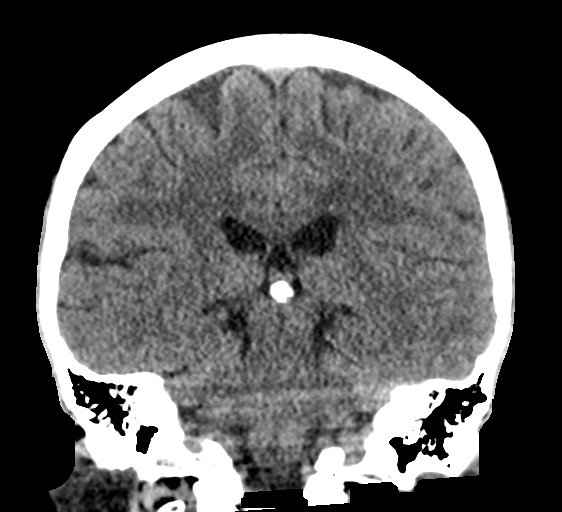
[im 39/70  brain]
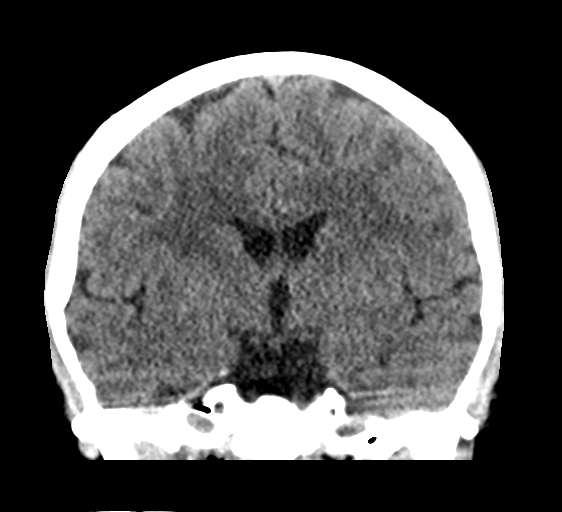

[Series 5: sagittal soft · sagittal · 0.30mm/px · 3 of 57 slices shown]
[im 19/57  brain]
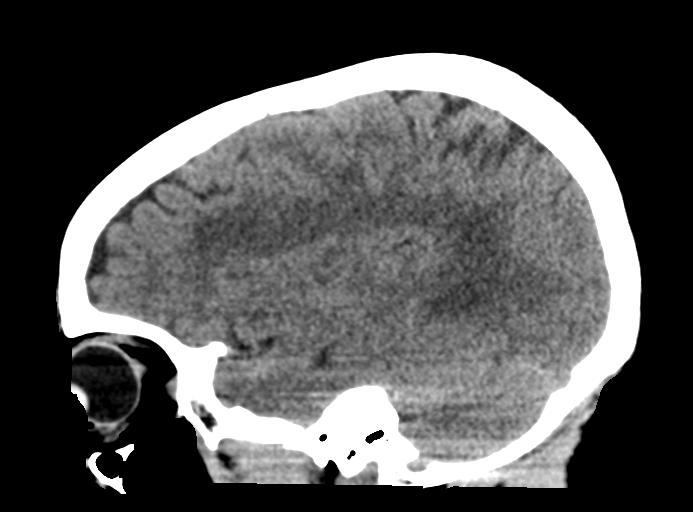
[im 29/57  brain]
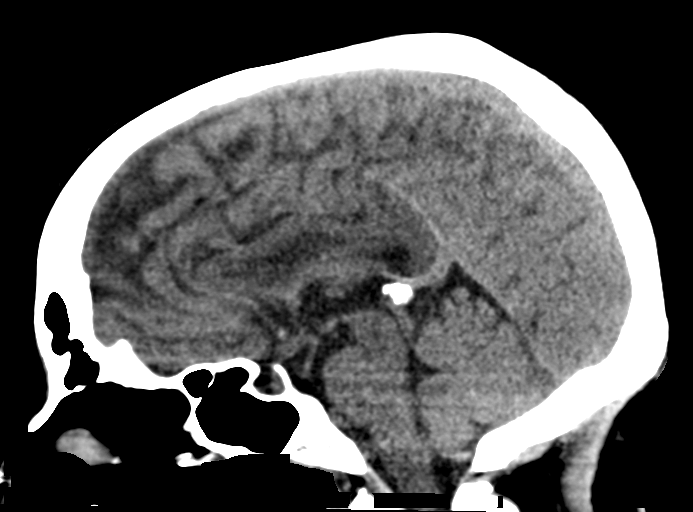
[im 38/57  brain]
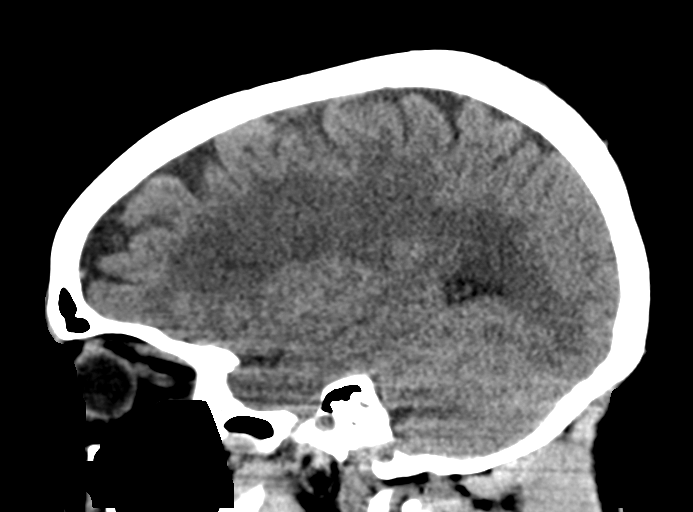

[15 of 47 positions shown; findings below may reference images not displayed]

FINDINGS: Brain: There is no mass, hemorrhage or extra-axial collection. The
size and configuration of the ventricles and extra-axial CSF spaces
are normal. There is hypoattenuation of the white matter, most
commonly indicating chronic small vessel disease.

Vascular: No abnormal hyperdensity of the major intracranial
arteries or dural venous sinuses. No intracranial atherosclerosis.

Skull: The visualized skull base, calvarium and extracranial soft
tissues are normal.

Sinuses/Orbits: No fluid levels or advanced mucosal thickening of
the visualized paranasal sinuses. No mastoid or middle ear effusion.
The orbits are normal.
IMPRESSION: Chronic small vessel disease without acute intracranial abnormality.

## 2022-05-19 ENCOUNTER — Encounter: Payer: Self-pay | Admitting: Radiology
